# Patient Record
Sex: Male | Born: 1960 | Race: White | Hispanic: No | Marital: Single | Smoking: Never smoker
Health system: Southern US, Community
[De-identification: ages and names within clinical notes are randomized; demographics above are authoritative.]

## PROBLEM LIST (undated history)

## (undated) DIAGNOSIS — E119 Type 2 diabetes mellitus without complications: Secondary | ICD-10-CM

## (undated) DIAGNOSIS — E118 Type 2 diabetes mellitus with unspecified complications: Secondary | ICD-10-CM

## (undated) DIAGNOSIS — Z8619 Personal history of other infectious and parasitic diseases: Secondary | ICD-10-CM

## (undated) DIAGNOSIS — I10 Essential (primary) hypertension: Secondary | ICD-10-CM

## (undated) DIAGNOSIS — Z87442 Personal history of urinary calculi: Secondary | ICD-10-CM

## (undated) DIAGNOSIS — E669 Obesity, unspecified: Secondary | ICD-10-CM

## (undated) DIAGNOSIS — R112 Nausea with vomiting, unspecified: Secondary | ICD-10-CM

## (undated) DIAGNOSIS — Z794 Long term (current) use of insulin: Secondary | ICD-10-CM

## (undated) DIAGNOSIS — R000 Tachycardia, unspecified: Secondary | ICD-10-CM

## (undated) DIAGNOSIS — G4700 Insomnia, unspecified: Secondary | ICD-10-CM

## (undated) DIAGNOSIS — M542 Cervicalgia: Secondary | ICD-10-CM

## (undated) DIAGNOSIS — R519 Headache, unspecified: Secondary | ICD-10-CM

## (undated) DIAGNOSIS — M1990 Unspecified osteoarthritis, unspecified site: Secondary | ICD-10-CM

## (undated) DIAGNOSIS — G8929 Other chronic pain: Secondary | ICD-10-CM

## (undated) DIAGNOSIS — IMO0001 Reserved for inherently not codable concepts without codable children: Secondary | ICD-10-CM

## (undated) DIAGNOSIS — R51 Headache: Secondary | ICD-10-CM

## (undated) DIAGNOSIS — Z9889 Other specified postprocedural states: Secondary | ICD-10-CM

## (undated) DIAGNOSIS — F419 Anxiety disorder, unspecified: Secondary | ICD-10-CM

## (undated) DIAGNOSIS — I2510 Atherosclerotic heart disease of native coronary artery without angina pectoris: Secondary | ICD-10-CM

## (undated) DIAGNOSIS — R350 Frequency of micturition: Secondary | ICD-10-CM

## (undated) DIAGNOSIS — Z0000 Encounter for general adult medical examination without abnormal findings: Secondary | ICD-10-CM

## (undated) DIAGNOSIS — M5481 Occipital neuralgia: Secondary | ICD-10-CM

## (undated) DIAGNOSIS — K219 Gastro-esophageal reflux disease without esophagitis: Secondary | ICD-10-CM

## (undated) HISTORY — DX: Type 2 diabetes mellitus with unspecified complications: E11.8

## (undated) HISTORY — DX: Long term (current) use of insulin: Z79.4

## (undated) HISTORY — PX: SHOULDER ADHESION RELEASE: SHX773

## (undated) HISTORY — DX: Encounter for general adult medical examination without abnormal findings: Z00.00

## (undated) HISTORY — DX: Tachycardia, unspecified: R00.0

## (undated) HISTORY — DX: Type 2 diabetes mellitus without complications: E11.9

## (undated) HISTORY — DX: Reserved for inherently not codable concepts without codable children: IMO0001

## (undated) HISTORY — DX: Personal history of other infectious and parasitic diseases: Z86.19

## (undated) HISTORY — DX: Obesity, unspecified: E66.9

## (undated) HISTORY — DX: Frequency of micturition: R35.0

## (undated) HISTORY — PX: WRIST SURGERY: SHX841

## (undated) HISTORY — PX: LITHOTRIPSY: SUR834

## (undated) HISTORY — DX: Insomnia, unspecified: G47.00

---
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
## 1989-04-06 HISTORY — PX: INGUINAL HERNIA REPAIR: SUR1180

## 2001-02-04 ENCOUNTER — Ambulatory Visit (HOSPITAL_COMMUNITY): Admission: RE | Admit: 2001-02-04 | Discharge: 2001-02-04 | Payer: Self-pay | Admitting: Pulmonary Disease

## 2001-02-13 ENCOUNTER — Ambulatory Visit (HOSPITAL_COMMUNITY): Admission: RE | Admit: 2001-02-13 | Discharge: 2001-02-13 | Payer: Self-pay | Admitting: Pulmonary Disease

## 2001-07-29 ENCOUNTER — Encounter: Payer: Self-pay | Admitting: Emergency Medicine

## 2001-07-29 ENCOUNTER — Emergency Department (HOSPITAL_COMMUNITY): Admission: EM | Admit: 2001-07-29 | Discharge: 2001-07-29 | Payer: Self-pay | Admitting: Emergency Medicine

## 2001-08-03 ENCOUNTER — Emergency Department (HOSPITAL_COMMUNITY): Admission: EM | Admit: 2001-08-03 | Discharge: 2001-08-03 | Payer: Self-pay | Admitting: *Deleted

## 2001-08-03 ENCOUNTER — Encounter: Payer: Self-pay | Admitting: *Deleted

## 2002-09-24 ENCOUNTER — Encounter (INDEPENDENT_AMBULATORY_CARE_PROVIDER_SITE_OTHER): Payer: Self-pay | Admitting: Internal Medicine

## 2002-09-24 ENCOUNTER — Ambulatory Visit (HOSPITAL_COMMUNITY): Admission: RE | Admit: 2002-09-24 | Discharge: 2002-09-24 | Payer: Self-pay | Admitting: Internal Medicine

## 2003-08-27 ENCOUNTER — Ambulatory Visit (HOSPITAL_COMMUNITY): Admission: RE | Admit: 2003-08-27 | Discharge: 2003-08-27 | Payer: Self-pay | Admitting: Urology

## 2003-08-27 ENCOUNTER — Ambulatory Visit (HOSPITAL_BASED_OUTPATIENT_CLINIC_OR_DEPARTMENT_OTHER): Admission: RE | Admit: 2003-08-27 | Discharge: 2003-08-27 | Payer: Self-pay | Admitting: Urology

## 2003-08-27 HISTORY — PX: CYSTOSCOPY/RETROGRADE/URETEROSCOPY/STONE EXTRACTION WITH BASKET: SHX5317

## 2004-06-30 ENCOUNTER — Ambulatory Visit (HOSPITAL_COMMUNITY): Admission: RE | Admit: 2004-06-30 | Discharge: 2004-06-30 | Payer: Self-pay | Admitting: Pulmonary Disease

## 2004-07-24 ENCOUNTER — Ambulatory Visit: Payer: Self-pay | Admitting: Orthopedic Surgery

## 2004-08-01 ENCOUNTER — Ambulatory Visit (HOSPITAL_COMMUNITY): Admission: RE | Admit: 2004-08-01 | Discharge: 2004-08-01 | Payer: Self-pay | Admitting: Orthopedic Surgery

## 2004-08-02 ENCOUNTER — Ambulatory Visit: Payer: Self-pay | Admitting: Orthopedic Surgery

## 2004-08-10 ENCOUNTER — Encounter (HOSPITAL_COMMUNITY): Admission: RE | Admit: 2004-08-10 | Discharge: 2004-09-09 | Payer: Self-pay | Admitting: Orthopedic Surgery

## 2004-09-11 ENCOUNTER — Encounter (HOSPITAL_COMMUNITY): Admission: RE | Admit: 2004-09-11 | Discharge: 2004-10-11 | Payer: Self-pay | Admitting: Orthopedic Surgery

## 2004-09-13 ENCOUNTER — Ambulatory Visit: Payer: Self-pay | Admitting: Orthopedic Surgery

## 2004-09-26 ENCOUNTER — Encounter: Admission: RE | Admit: 2004-09-26 | Discharge: 2004-09-26 | Payer: Self-pay | Admitting: Orthopedic Surgery

## 2004-10-04 ENCOUNTER — Ambulatory Visit: Payer: Self-pay | Admitting: Orthopedic Surgery

## 2004-10-11 ENCOUNTER — Ambulatory Visit: Payer: Self-pay | Admitting: Orthopedic Surgery

## 2004-10-20 ENCOUNTER — Ambulatory Visit (HOSPITAL_COMMUNITY): Admission: RE | Admit: 2004-10-20 | Discharge: 2004-10-20 | Payer: Self-pay | Admitting: Orthopedic Surgery

## 2004-10-20 ENCOUNTER — Ambulatory Visit: Payer: Self-pay | Admitting: Orthopedic Surgery

## 2004-10-20 HISTORY — PX: SHOULDER ARTHROSCOPY W/ ROTATOR CUFF REPAIR: SHX2400

## 2004-10-23 ENCOUNTER — Ambulatory Visit: Payer: Self-pay | Admitting: Orthopedic Surgery

## 2004-10-31 ENCOUNTER — Encounter (HOSPITAL_COMMUNITY): Admission: RE | Admit: 2004-10-31 | Discharge: 2004-11-30 | Payer: Self-pay | Admitting: Orthopedic Surgery

## 2004-11-09 ENCOUNTER — Ambulatory Visit: Payer: Self-pay | Admitting: Orthopedic Surgery

## 2004-11-22 ENCOUNTER — Ambulatory Visit: Payer: Self-pay | Admitting: Orthopedic Surgery

## 2004-12-06 ENCOUNTER — Ambulatory Visit: Payer: Self-pay | Admitting: Orthopedic Surgery

## 2004-12-13 ENCOUNTER — Ambulatory Visit (HOSPITAL_COMMUNITY): Admission: RE | Admit: 2004-12-13 | Discharge: 2004-12-13 | Payer: Self-pay | Admitting: Family Medicine

## 2004-12-18 ENCOUNTER — Ambulatory Visit: Payer: Self-pay | Admitting: Orthopedic Surgery

## 2004-12-22 ENCOUNTER — Ambulatory Visit: Payer: Self-pay | Admitting: Orthopedic Surgery

## 2004-12-22 ENCOUNTER — Ambulatory Visit (HOSPITAL_COMMUNITY): Admission: RE | Admit: 2004-12-22 | Discharge: 2004-12-22 | Payer: Self-pay | Admitting: Orthopedic Surgery

## 2004-12-22 HISTORY — PX: SHOULDER ARTHROSCOPY W/ SUPERIOR LABRAL ANTERIOR POSTERIOR LESION REPAIR: SHX2402

## 2004-12-25 ENCOUNTER — Ambulatory Visit: Payer: Self-pay | Admitting: Orthopedic Surgery

## 2005-01-02 ENCOUNTER — Ambulatory Visit: Payer: Self-pay | Admitting: Orthopedic Surgery

## 2005-02-07 ENCOUNTER — Ambulatory Visit: Payer: Self-pay | Admitting: Orthopedic Surgery

## 2005-03-21 ENCOUNTER — Ambulatory Visit: Payer: Self-pay | Admitting: Orthopedic Surgery

## 2005-05-10 ENCOUNTER — Ambulatory Visit: Payer: Self-pay | Admitting: Orthopedic Surgery

## 2005-06-18 ENCOUNTER — Ambulatory Visit: Payer: Self-pay | Admitting: Orthopedic Surgery

## 2005-07-12 ENCOUNTER — Ambulatory Visit: Payer: Self-pay | Admitting: Orthopedic Surgery

## 2005-07-19 ENCOUNTER — Ambulatory Visit (HOSPITAL_COMMUNITY): Admission: RE | Admit: 2005-07-19 | Discharge: 2005-07-19 | Payer: Self-pay | Admitting: Orthopedic Surgery

## 2005-08-22 ENCOUNTER — Ambulatory Visit: Payer: Self-pay | Admitting: Orthopedic Surgery

## 2005-10-03 ENCOUNTER — Ambulatory Visit: Payer: Self-pay | Admitting: Orthopedic Surgery

## 2005-12-03 ENCOUNTER — Ambulatory Visit: Payer: Self-pay | Admitting: Orthopedic Surgery

## 2007-08-15 ENCOUNTER — Ambulatory Visit: Admission: RE | Admit: 2007-08-15 | Discharge: 2007-08-15 | Payer: Self-pay | Admitting: Rheumatology

## 2007-08-24 ENCOUNTER — Emergency Department (HOSPITAL_COMMUNITY): Admission: EM | Admit: 2007-08-24 | Discharge: 2007-08-24 | Payer: Self-pay | Admitting: Emergency Medicine

## 2007-09-23 ENCOUNTER — Ambulatory Visit: Payer: Self-pay | Admitting: Pulmonary Disease

## 2007-12-04 ENCOUNTER — Ambulatory Visit (HOSPITAL_COMMUNITY): Admission: RE | Admit: 2007-12-04 | Discharge: 2007-12-04 | Payer: Self-pay | Admitting: Rheumatology

## 2007-12-04 ENCOUNTER — Encounter (INDEPENDENT_AMBULATORY_CARE_PROVIDER_SITE_OTHER): Payer: Self-pay | Admitting: Rheumatology

## 2007-12-04 ENCOUNTER — Ambulatory Visit: Payer: Self-pay | Admitting: Surgery

## 2007-12-30 ENCOUNTER — Ambulatory Visit (HOSPITAL_COMMUNITY): Admission: RE | Admit: 2007-12-30 | Discharge: 2007-12-30 | Payer: Self-pay | Admitting: Rheumatology

## 2010-08-26 ENCOUNTER — Encounter: Payer: Self-pay | Admitting: Pulmonary Disease

## 2010-08-27 ENCOUNTER — Encounter: Payer: Self-pay | Admitting: Orthopedic Surgery

## 2010-12-19 NOTE — Procedures (Signed)
NAME:  Horn, Sean NO.:  [id number]   MEDICAL RECORD NO.:  [id number]          PATIENT TYPE:  OUT   LOCATION:  SLEEP LAB                     FACILITY:  [facility]   PHYSICIAN:  Barbaraann Share, MD,FCCPDATE OF BIRTH:  September 24[birth year] 1960   DATE OF STUDY:  08/15/2007                            NOCTURNAL POLYSOMNOGRAM   REFERRING PHYSICIAN:   INDICATION FOR STUDY:  Hypersomnia with sleep apnea.   EPWORTH SLEEPINESS SCORE:  13.   SLEEP ARCHITECTURE:  The patient had total sleep time of 370 minutes  with deceased slow wave sleep as well as REM.  Sleep onset latency was  normal at 11 minutes, and REM onset was normal at 67 minutes. Sleep  efficiency was mildly decreased at 90%.   RESPIRATORY DATA:  The patient was found to have 5 obstructive apneas  and 3 obstructive hypopneas for an apnea/hypopnea index of 2.6 events  per hour overall.  The events were not positional, but they were most  common during REM.  Loud snoring was noted throughout.  The patient did  not need split night criteria secondary to the small numbers of events.   OXYGEN DATA:  There was O2 desaturation as low as 83%.   CARDIAC DATA:  No clinically significant arrhythmias were noted.   MOVEMENT-PARASOMNIA:  The patient was found to have 18 leg jerks with 4  resulting in arousal from sleep.  These are clinically insignificant.   IMPRESSIONS-RECOMMENDATIONS:  Small numbers of obstructive events which  do not meet the apnea/hypopnea index criteria for the obstructive sleep  apnea syndrome.  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  Treatment for this can include weight loss, body  position changes, and upper airway surgery or oral appliance.     Barbaraann Share, MD,FCCP  Diplomate, American Board of Sleep  Medicine  Electronically Signed    [facility]/MEDQ  D:  09/23/2007 06:27:57  T:  09/23/2007  16:41:06  Job:  [id number]

## 2010-12-22 NOTE — Op Note (Signed)
NAME:  Sean Horn, CAJUSTE                        ACCOUNT NO.:  [id number]   MEDICAL RECORD NO.:  [id number]                   PATIENT TYPE:  AMB   LOCATION:  [facility]                                 FACILITY:  [facility]   PHYSICIAN:  Mark C. Vernie Ammons, M.D.               DATE OF BIRTH:  [birth year]   DATE OF PROCEDURE:  08/27/2003  DATE OF DISCHARGE:                                 OPERATIVE REPORT   PREOPERATIVE DIAGNOSIS:  Right ureteral calculus.   POSTOPERATIVE DIAGNOSIS:  Right ureteral calculus.   PROCEDURES:  1. Cystoscopy.  2. Right retrograde pyelogram with interpretation.  3. Laser lithotripsy with stone extraction.  4. Double J stent placement.   SURGEON:  Mark C. Vernie Ammons, M.D.   ANESTHESIA:  General.   ESTIMATED BLOOD LOSS:  Less than 1 mL.   DRAINS:  6 French, 26 cm double J stent in the right ureter.   SPECIMENS:  Stone to the patient.   COMPLICATIONS:  None.   INDICATIONS:  The patient is a 50 year old white male who has had  intermittent right flank pain and failure to progress of a 1 cm stone in his  right ureter.  It appeared on CT to be impacted with significant  periureteral inflammation.  He therefore is brought to the OR today for  ureteroscopic extraction of his stone.  The risks, complications, and  alternatives have been discussed.   DESCRIPTION OF OPERATION:  After informed consent, the patient brought to  the major OR, placed on the table, moved general anesthesia, and then moved  to the dorsal lithotomy position.  His genitalia was sterilely prepped and  draped and initially a 6 French rigid ureteroscope was introduced per  urethra.  The urethra was noted to be normal down to the sphincter, which  was intact.  The prostatic urethra was without lesion.  The bladder had no  tumors, stones, or inflammatory lesions seen.  The right ureteral orifice  was identified and a 0.038 inch floppy-tip guidewire was advanced up the  right ureter under direct  fluoroscopic control.  The evaluation of the  ureteral orifice size indicated the scope could not be passed next to the  guidewire; therefore, the ureteroscope was removed and a ureteral dilating  balloon was passed over the guidewire.  The ureter was dilated and the  guidewire was left in place.  I then was able to pass the ureteroscope next  to the guidewire up to the stone.  It was visualized also by retrograde  pyelogram through the dilating balloon as a filling defect.  The stone was  felt too large to extract, and therefore a holmium laser fiber was then  passed through the ureteroscope up to the stone.  The stone was fragmented.  I then used a combination of three-pronged grasper, a nitinol basket, and a  Segura basket to extract all of the stone fragments.  Reinspection of the  ureter  revealed that it remained intact throughout with no perforation.  There were no significant stone fragments other than just some sand grain  size pieces left in the ureter.   With the guidewire in place the cystoscope was backloaded over the guidewire  and the double J stent was passed over the guidewire into the renal pelvis.  The guidewire was removed, resulting in a good curl of the stent in the  renal pelvis and bladder.  There were some small bits of stone seen on the  floor of the bladder from where they had passed down the ureter.  These will  pass spontaneously with voiding.   I have given the patient a prescription for Pyridium Plus, #24, and he has  Tylox.  He will return to my office for stent removal in approximately one  week.                                               Mark C. Vernie Ammons, M.D.    MCO/MEDQ  D:  08/27/2003  T:  08/27/2003  Job:  [id number]

## 2010-12-22 NOTE — H&P (Signed)
NAME:  Sean Horn, Sean Horn NO.:  [id number]   MEDICAL RECORD NO.:  [id number]          PATIENT TYPE:  AMB   LOCATION:  DAY                           FACILITY:  [facility]   PHYSICIAN:  Vickki Hearing, M.D.DATE OF BIRTH:  May 15, 1961   DATE OF ADMISSION:  DATE OF DISCHARGE:  LH                                HISTORY & PHYSICAL   CHIEF COMPLAINT:  Left shoulder pain.   PAST MEDICAL HISTORY:  1.  Hypertension.  2.  Diabetes.  3.  Status post left shoulder distal clavicle excision, right wrist fusion.  4.  Left knee arthropathy and cyst removal.  5.  Cholecystectomy.  6.  Left inguinal herniorrhaphy.   PAST SURGICAL HISTORY:  As stated.   FAMILY HISTORY:  Heart disease, lung disease, arthritis, cancer.   DRUG ALLERGIES:  TORADOL.   MEDICATIONS:  Avandamet and Diovan.   This 50 year old male who had an open Mumford procedure of his left shoulder  after trauma did well for seven years. Over the past year, he has had  increasing left shoulder pain and has failed conservative therapy. He had a  positive lidocaine injection test for impingement of the left shoulder. He  had a MRI of the left shoulder without contrast which showed partial  thickness tears of the distal supraspinatus tendon and tendinopathy type  changes of the tendon.   SOCIAL HISTORY:  His family physician is Dr. Juanetta Gosling. 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   PHYSICAL EXAMINATION:  VITAL SIGNS:  Weight 240, pulse 78, respiratory rate  20.  GENERAL:  Development, grooming, and hygiene normal. Body habitus large.  CARDIOVASCULAR:  Observation and palpation normal.  LYMPHATIC:  Cervical lymph nodes normal.  SKIN: Incision over clavicle on the left normal.  NEUROPSYCH:  Normal reflexes, sensation, coordination. He is alert and  oriented x3. His mood is pleasant.  MUSCULOSKELETAL:  Gait and station are normal. Cervical spine is nontender.  Left shoulder range of motion 0 to 120  in flexion, 35 degrees external  rotation, internal rotation to L2. He has normal shoulder stability. Muscle  strength and tone are normal. Impingement sign is positive. O'Brien's test  is normal.   IMPRESSION:  Left shoulder rotator cuff syndrome. Recommend arthroscopic  subacromial decompression, left shoulder. Possible mini-open rotator cuff  repair.      SEH/MEDQ  D:  10/19/2004  T:  10/19/2004  Job:  [id number]

## 2010-12-22 NOTE — Op Note (Signed)
NAME:  Sean, Horn NO.:  [id number]   MEDICAL RECORD NO.:  [id number]          PATIENT TYPE:  AMB   LOCATION:  DAY                           FACILITY:  [facility]   PHYSICIAN:  Vickki Hearing, M.D.DATE OF BIRTH:  May 15, 1961   DATE OF PROCEDURE:  10/20/2004  DATE OF DISCHARGE:  10/20/2004                                 OPERATIVE REPORT   Continuation of dictation on Sean Horn:   The posterior portal was used and a diagnostic arthroscopy was performed.  An anterior portal was established and a rotator cuff tear was found and was  debrided from an anterior portal.  Remaining structures in the shoulder were  normal.  The subacromial space was entered and a lateral portal was  established.  Using an anterior and lateral portal, a bursectomy was  performed and an acromioplasty was performed.  Extensive debridement of the  rotator cuff was performed with a Topaz wand and the shoulder was irrigated  and the portal sites were closed with nylon suture.  Several accessory  portals were used to better assess the tendon with the Topaz wand.  The  portals were closed with 3-0 Prolene.  Sterile dressings were applied after  injecting the subacromial space with 30 mL of Marcaine.  Cryo/Cuff was  applied along with the sterile dressings, and the patient was extubated in  recovery room in stable condition.      SEH/MEDQ  D:  10/21/2004  T:  10/23/2004  Job:  [id number]

## 2010-12-22 NOTE — H&P (Signed)
NAME:  Sean Horn, Sean Horn NO.:  [id number]   MEDICAL RECORD NO.:  [id number]          PATIENT TYPE:  AMB   LOCATION:  DAY                           FACILITY:  [facility]   PHYSICIAN:  Vickki Hearing, M.D.DATE OF BIRTH:  [birth year]   DATE OF ADMISSION:  DATE OF DISCHARGE:  LH                                HISTORY & PHYSICAL   CHIEF COMPLAINT:  Recurrent left shoulder pain.   HISTORY OF PRESENT ILLNESS:  This is a 50 year old male who had an open  Mumford procedure on his left shoulder after trauma seven or eight years  ago.  Over the last year and a half he had increasing left shoulder pain,  failed conservative therapy including Lidocaine test and he had an MRI which  showed partial thickness tear of his supraspinatus tendon.  He underwent  arthroscopy of the shoulder, subacromial decompression, debridement of his  rotator cuff.  He was doing well in therapy until during one therapy session  he felt a pop.  It progressively worsened and eventually he had a new MRI  which showed a torn supraspinatus tendon.  Therefore we are going to do an  arthroscopy of the left shoulder and a mini open rotator cuff repair.   DIAGNOSIS:  Torn rotator cuff with a possible slat injury as well.   PAST MEDICAL HISTORY:  1.  Hypertension.  2.  Diabetes.  3.  Status post right wrist fusion.  4.  Left knee arthroscopy and cyst removal.  5.  Cholecystectomy.  6.  Inguinal hernia repair on the left.   PAST SURGICAL HISTORY:  As stated.   FAMILY HISTORY:  Heart and lung disease, arthritis, cancer.   ALLERGIES:  TORADOL.   MEDICATIONS:  Avandamet and Diovan.   SOCIAL HISTORY:  He is followed by Dr. Juanetta Gosling.  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   PHYSICAL EXAMINATION:  VITAL SIGNS:  Weight 240, pulse 76, respiratory rate  18.  GENERAL:  Shows normal development, grooming, and hygiene.  Body habitus  large.  CARDIOVASCULAR:  Observation and palpation  normal.  LYMPH NODES:  Cervical lymph nodes benign.  SKIN:  Incision over clavicle normal.  NEUROPSYCHIATRIC:  Reflexes, sensation, coordination normal.  He is alert  and oriented x3.  Mood is pleasant.  MUSCULOSKELETAL:  Shows normal gait and station.  Cervical spine is  nontender.  He has painful range of motion in his left shoulder.  There is  100 degrees of flexion, and after that severe pain.  There is weakness in  the rotator cuff but it is not significant.  The shoulder is stable.  Impingement sign remains positive.   IMPRESSION:  Left rotator cuff tear.   PLAN:  Arthroscopy and mini open rotator cuff repair.      SEH/MEDQ  D:  12/21/2004  T:  12/21/2004  Job:  [id number]

## 2010-12-22 NOTE — Op Note (Signed)
NAME:  [name], [name] NO.:  [id number]   MEDICAL RECORD NO.:  [id number]          PATIENT TYPE:  AMB   LOCATION:  DAY                           FACILITY:  [facility]   PHYSICIAN:  Vickki Hearing, M.D.DATE OF BIRTH:  03/26/1961   DATE OF PROCEDURE:  10/20/2004  DATE OF DISCHARGE:  10/20/2004                                 OPERATIVE REPORT   PREOPERATIVE DIAGNOSIS:  Rotator cuff syndrome, left shoulder.   POSTOPERATIVE DIAGNOSIS:  Rotator cuff syndrome, left shoulder.   PROCEDURE:  Arthroscopic surgery, left shoulder; arthroscopic subacromial  decompression and extensive debridement of rotator cuff.   SURGEON:  Vickki Hearing, M.D.   ANESTHETIC:  General.   OPERATIVE FINDINGS:  There was severe bursitis of the subacromial space.  There was a mild undersurface tear at the of the rotator cuff supraspinatus  tendon.   INDICATIONS FOR PROCEDURE:  Pain.   The patient identified as Sean Horn, the left shoulder as marked for  surgery, the history and physical was updated, antibiotics were given.  He  was taken to the operating room for general anesthesia.  He was placed in a  semiupright position in the beach chair modified position.  His left  shoulder was prepped and draped using sterile technique.  After satisfactory  anesthesia, time-out was taken.  The patient identity was confirmed,  procedure was confirmed, and the extremity site was confirmed.  All  equipment was in the room.   A posterior portal was used.   Dictation ended at this point.      SEH/MEDQ  D:  10/21/2004  T:  10/23/2004  Job:  [id number]

## 2010-12-22 NOTE — Op Note (Signed)
NAME:  Sean Horn, Sean Horn NO.:  [id number]   MEDICAL RECORD NO.:  [id number]          PATIENT TYPE:  AMB   LOCATION:  DAY                           FACILITY:  [facility]   PHYSICIAN:  Vickki Hearing, M.D.DATE OF BIRTH:  1960/11/28   DATE OF PROCEDURE:  12/22/2004  DATE OF DISCHARGE:  12/22/2004                                 OPERATIVE REPORT   PREOPERATIVE DIAGNOSES:  1.  Supraspinatus tear, left shoulder.  2.  SLAP lesion type 1, left shoulder.   POSTOPERATIVE DIAGNOSES:  1.  Supraspinatus tear, left shoulder.  2.  SLAP lesion type 1, left shoulder.   OPERATION/PROCEDURE:  1.  Arthroscopy, left shoulder.  2.  Debridement of degenerative SLAP tear.  3.  Inspection of rotator cuff with rotator cuff repair.   SURGEON:  Vickki Hearing, M.D.   ANESTHESIA:  General.   OPERATIVE FINDINGS:  There was a SLAP lesion, grade 1, which was debrided.  There was intrasubstance degeneration and tearing the supraspinatus tendon.  This was repaired with two #2 surgical Ethibond sutures.  The history is  that the patient had decompression of the left shoulder.  Did well in  therapy about four to six weeks.  Then felt the pop in the shoulder during  therapy and had progressive increase in pain and decrease in motion.  Repeat  MRI with contrast showed a supraspinatus tendon and a SLAP tear.  The  surgery was done as follows:   DESCRIPTION OF PROCEDURE:  Preoperatively his left shoulder was marked at  the surgical site, signed by the surgeon.  History and physical was updated  and his antibiotics were given.   He was taken to the operating room for general anesthesia, placed in the  beach chair position and positioned appropriately.  His left shoulder was  prepped and draped using sterile technique. Time out was taken and the  parameters of the time out were completed.   Through a posterior approach we did a diagnostic arthroscopy.  There was a  grade 1 SLAP lesion.  This was debrided from an anterior cannula with a  motorized shaver.  There was undersurface tearing of the rotator cuff.  The  scope was placed in the subacromial space.  There was inflammation in the  subacromial space in the tendon itself but no bursitis.  Through a mini  incision in the skin, it was divided over the anterolateral acromion.  Deltoid was split and the tendon was split in line with its fibers  (supraspinatus) and the delamination seen from inside the joint was  inspected, debrided and then two #2 Ethibond sutures were used to close the  defect.  The wound was irrigated.  Subcutaneous pain pump was placed.  The  deltoid was reapproximated with interrupted sutures and drill holes were  used to reapproximate the portion which was removed from the bone.  A 2-0  Monocryl and staples were used to reapproximate the skin.  Sterile dressing  was applied.  Cryocuff was applied.  The patient was extubated and taken to  the recovery room in stable condition.  SEH/MEDQ  D:  12/23/2004  T:  12/25/2004  Job:  [id number]

## 2011-04-26 LAB — CBC
HCT: 43.4
Hemoglobin: 14.8
MCHC: 34
MCV: 85.3
Platelets: 347
RBC: 5.09
RDW: 13.1
WBC: 16 — ABNORMAL HIGH

## 2011-04-26 LAB — DIFFERENTIAL
Band Neutrophils: 0
Basophils Absolute: 0
Basophils Relative: 0
Blasts: 0
Eosinophils Absolute: 4.8 — ABNORMAL HIGH
Eosinophils Relative: 30 — ABNORMAL HIGH
Lymphocytes Relative: 34
Lymphs Abs: 5.5 — ABNORMAL HIGH
Metamyelocytes Relative: 0
Monocytes Absolute: 1.4 — ABNORMAL HIGH
Monocytes Relative: 9
Myelocytes: 0
Neutro Abs: 4.3
Neutrophils Relative %: 27 — ABNORMAL LOW
Promyelocytes Absolute: 0
nRBC: 0

## 2011-04-26 LAB — BASIC METABOLIC PANEL
BUN: 6
CO2: 29
Calcium: 8.8
Chloride: 97
Creatinine, Ser: 0.84
GFR calc Af Amer: >60
GFR calc non Af Amer: >60
Glucose, Bld: 143 — ABNORMAL HIGH
Potassium: 3.7
Sodium: 133 — ABNORMAL LOW

## 2011-04-26 LAB — POCT CARDIAC MARKERS
CKMB, poc: <1 — ABNORMAL LOW
Myoglobin, poc: 56.8
Operator id: 189501
Troponin i, poc: <0.05

## 2011-04-26 LAB — CK: Total CK: 37

## 2011-04-26 LAB — SEDIMENTATION RATE: Sed Rate: 36 — ABNORMAL HIGH

## 2011-05-22 ENCOUNTER — Ambulatory Visit: Payer: Self-pay | Admitting: Orthopedic Surgery

## 2011-08-07 HISTORY — PX: KNEE ARTHROSCOPY: SHX127

## 2012-06-30 ENCOUNTER — Encounter (HOSPITAL_COMMUNITY): Payer: Self-pay | Admitting: *Deleted

## 2012-06-30 ENCOUNTER — Emergency Department (HOSPITAL_COMMUNITY)
Admission: EM | Admit: 2012-06-30 | Discharge: 2012-06-30 | Disposition: A | Discharge status: Home / Self Care | Payer: Managed Care, Other (non HMO) | Attending: Emergency Medicine | Admitting: Emergency Medicine

## 2012-06-30 DIAGNOSIS — R109 Unspecified abdominal pain: Secondary | ICD-10-CM

## 2012-06-30 DIAGNOSIS — R350 Frequency of micturition: Secondary | ICD-10-CM | POA: Insufficient documentation

## 2012-06-30 DIAGNOSIS — Z79899 Other long term (current) drug therapy: Secondary | ICD-10-CM | POA: Insufficient documentation

## 2012-06-30 DIAGNOSIS — R739 Hyperglycemia, unspecified: Secondary | ICD-10-CM

## 2012-06-30 DIAGNOSIS — I10 Essential (primary) hypertension: Secondary | ICD-10-CM | POA: Insufficient documentation

## 2012-06-30 DIAGNOSIS — E1169 Type 2 diabetes mellitus with other specified complication: Secondary | ICD-10-CM | POA: Insufficient documentation

## 2012-06-30 HISTORY — DX: Essential (primary) hypertension: I10

## 2012-06-30 LAB — URINALYSIS, ROUTINE W REFLEX MICROSCOPIC
Bilirubin Urine: NEGATIVE
Ketones, ur: 15 mg/dL — AB
Leukocytes, UA: NEGATIVE
Nitrite: NEGATIVE
Protein, ur: NEGATIVE mg/dL
Urobilinogen, UA: 0.2 mg/dL (ref 0.0–1.0)

## 2012-06-30 LAB — CBC WITH DIFFERENTIAL/PLATELET
Basophils Absolute: 0 10*3/uL (ref 0.0–0.1)
Basophils Relative: 1 % (ref 0–1)
Eosinophils Absolute: 0.1 10*3/uL (ref 0.0–0.7)
Eosinophils Relative: 2 % (ref 0–5)
Lymphocytes Relative: 33 % (ref 12–46)
MCHC: 38.1 g/dL — ABNORMAL HIGH (ref 30.0–36.0)
MCV: 84.7 fL (ref 78.0–100.0)
Platelets: 224 10*3/uL (ref 150–400)
RDW: 12.3 % (ref 11.5–15.5)
WBC: 5.3 10*3/uL (ref 4.0–10.5)

## 2012-06-30 LAB — BASIC METABOLIC PANEL
BUN: 14 mg/dL (ref 6–23)
Calcium: 8.6 mg/dL (ref 8.4–10.5)
Chloride: 96 mEq/L (ref 96–112)
Creatinine, Ser: 0.82 mg/dL (ref 0.50–1.35)
GFR calc Af Amer: >90 mL/min (ref >90)
GFR calc non Af Amer: >90 mL/min (ref >90)

## 2012-06-30 LAB — GLUCOSE, CAPILLARY: Glucose-Capillary: 450 mg/dL — ABNORMAL HIGH (ref 70–99)

## 2012-06-30 MED ORDER — INSULIN REGULAR HUMAN 100 UNIT/ML IJ SOLN: 5.0000 [IU] | Freq: Once | INTRAMUSCULAR | Status: DC

## 2012-06-30 MED ORDER — SODIUM CHLORIDE 0.9 % IV BOLUS (SEPSIS)
1000.0000 mL | Freq: Once | INTRAVENOUS | Status: AC
  Administered 2012-06-30: 1000 mL via INTRAVENOUS

## 2012-06-30 MED ORDER — INSULIN ASPART 100 UNIT/ML ~~LOC~~ SOLN
5.0000 [IU] | Freq: Once | SUBCUTANEOUS | Status: AC
  Administered 2012-06-30: 5 [IU] via INTRAVENOUS
  Filled 2012-06-30: qty 1

## 2012-06-30 NOTE — ED Provider Notes (Signed)
History     CSN: [id number]  Arrival date & time 06/30/12  7829   First MD Initiated Contact with Patient 06/30/12 1032      Chief Complaint  Patient presents with  . Hyperglycemia  . Abdominal Pain    (Consider location/radiation/quality/duration/timing/severity/associated sxs/prior treatment) Patient is a 51 y.o. male presenting with abdominal pain. The history is provided by the patient.  Abdominal Pain The primary symptoms of the illness include abdominal pain. The primary symptoms of the illness do not include fever, shortness of breath, nausea, vomiting, diarrhea or dysuria.  Additional symptoms associated with the illness include frequency. Associated symptoms comments: Abdominal discomfort without nausea, vomiting or change in bowel habit. Last bowel movement was this morning around 1:00. He reports increasing urinary frequency. No testicular discomfort or flank pain. He is a diabetic on Janumet for 3 years with consistent blood sugar control and no recent changes in dosing or diet. .    Past Medical History  Diagnosis Date  . Diabetes mellitus without complication   . Hypertension     Past Surgical History  Procedure Date  . Wrist surgery     Right  . Shoulder surgery     left  . Knee surgery     bilateral  . Hernia repair     left  . Cholecystectomy     History reviewed. No pertinent family history.  History  Substance Use Topics  . Smoking status: Never Smoker   . Smokeless tobacco: Current User  . Alcohol Use: No      Review of Systems  Constitutional: Negative for fever.  Respiratory: Negative.  Negative for shortness of breath.   Cardiovascular: Negative.  Negative for chest pain.  Gastrointestinal: Positive for abdominal pain. Negative for nausea, vomiting and diarrhea.  Genitourinary: Positive for frequency. Negative for dysuria, flank pain, scrotal swelling, penile pain and testicular pain.  Musculoskeletal: Negative.   Neurological:  Negative.  Negative for dizziness and weakness.    Allergies  Toradol  Home Medications   Current Outpatient Rx  Name  Route  Sig  Dispense  Refill  . ALPRAZOLAM 0.5 MG PO TABS   Oral   Take 0.25 mg by mouth 2 (two) times daily.         Marland Kitchen LISINOPRIL-HYDROCHLOROTHIAZIDE 20-12.5 MG PO TABS   Oral   Take 1 tablet by mouth daily.         Marland Kitchen NAPROXEN-ESOMEPRAZOLE 500-20 MG PO TBEC   Oral   Take 1 tablet by mouth 2 (two) times daily.         Marland Kitchen SITAGLIPTIN-METFORMIN HCL 50-1000 MG PO TABS   Oral   Take 1 tablet by mouth 2 (two) times daily with a meal.           BP 128/89  Pulse 110  Temp 98.2 F (36.8 C) (Oral)  Resp 18  SpO2 98%  Physical Exam  Constitutional: He appears well-developed and well-nourished.  HENT:  Head: Normocephalic.  Neck: Normal range of motion. Neck supple.  Cardiovascular: Normal rate and regular rhythm.   Pulmonary/Chest: Effort normal and breath sounds normal.  Abdominal: Soft. Bowel sounds are normal. He exhibits no distension and no mass. There is tenderness. There is no rebound and no guarding.       Mild lower abdominal tenderness to palpation, greater on left. No mass. BS normoactive.  Musculoskeletal: Normal range of motion.  Neurological: He is alert. No cranial nerve deficit.  Skin: Skin is warm and dry. No  rash noted.  Psychiatric: He has a normal mood and affect.    ED Course  Procedures (including critical care time)  Labs Reviewed  GLUCOSE, CAPILLARY - Abnormal; Notable for the following:    Glucose-Capillary 450 (*)     All other components within normal limits   No results found. Results for orders placed during the hospital encounter of 06/30/12  GLUCOSE, CAPILLARY      Component Value Range   Glucose-Capillary 450 (*) 70 - 99 mg/dL  CBC WITH DIFFERENTIAL      Component Value Range   WBC 5.3  4.0 - 10.5 K/uL   RBC 4.43  4.22 - 5.81 MIL/uL   Hemoglobin 14.3  13.0 - 17.0 g/dL   HCT 81.1 (*) 91.4 - 78.2 %   MCV  84.7  78.0 - 100.0 fL   MCH 32.3  26.0 - 34.0 pg   MCHC 38.1 (*) 30.0 - 36.0 g/dL   RDW 95.6  21.3 - 08.6 %   Platelets 224  150 - 400 K/uL   Neutrophils Relative 55  43 - 77 %   Neutro Abs 3.0  1.7 - 7.7 K/uL   Lymphocytes Relative 33  12 - 46 %   Lymphs Abs 1.8  0.7 - 4.0 K/uL   Monocytes Relative 9  3 - 12 %   Monocytes Absolute 0.5  0.1 - 1.0 K/uL   Eosinophils Relative 2  0 - 5 %   Eosinophils Absolute 0.1  0.0 - 0.7 K/uL   Basophils Relative 1  0 - 1 %   Basophils Absolute 0.0  0.0 - 0.1 K/uL   Smear Review MORPHOLOGY UNREMARKABLE    BASIC METABOLIC PANEL      Component Value Range   Sodium 130 (*) 135 - 145 mEq/L   Potassium 3.8  3.5 - 5.1 mEq/L   Chloride 96  96 - 112 mEq/L   CO2 24  19 - 32 mEq/L   Glucose, Bld 405 (*) 70 - 99 mg/dL   BUN 14  6 - 23 mg/dL   Creatinine, Ser 5.78  0.50 - 1.35 mg/dL   Calcium 8.6  8.4 - 46.9 mg/dL   GFR calc non Af Amer >90  >90 mL/min   GFR calc Af Amer >90  >90 mL/min  URINALYSIS, ROUTINE W REFLEX MICROSCOPIC      Component Value Range   Color, Urine YELLOW  YELLOW   APPearance CLEAR  CLEAR   Specific Gravity, Urine >1.046 (*) 1.005 - 1.030   pH 5.5  5.0 - 8.0   Glucose, UA >1000 (*) NEGATIVE mg/dL   Hgb urine dipstick NEGATIVE  NEGATIVE   Bilirubin Urine NEGATIVE  NEGATIVE   Ketones, ur 15 (*) NEGATIVE mg/dL   Protein, ur NEGATIVE  NEGATIVE mg/dL   Urobilinogen, UA 0.2  0.0 - 1.0 mg/dL   Nitrite NEGATIVE  NEGATIVE   Leukocytes, UA NEGATIVE  NEGATIVE  URINE MICROSCOPIC-ADD ON      Component Value Range   Bacteria, UA RARE  RARE  GLUCOSE, CAPILLARY      Component Value Range   Glucose-Capillary 382 (*) 70 - 99 mg/dL   Comment 1 Documented in Chart     Comment 2 Notify RN    GLUCOSE, CAPILLARY      Component Value Range   Glucose-Capillary 275 (*) 70 - 99 mg/dL   Comment 1 Notify RN       No diagnosis found.  1. Hyperglycemia 2. Abdominal pain  MDM  His abdominal discomfort is persistent and exam is unchanged.  Labs are essentially normal and blood sugar has responded to small dose of insulin and fluid boluses. Dr. Jeraldine Loots has co-evaluated the patient. Feel he is stable for discharge. Patient and family comfortable with care plan.        Rodena Medin, PA-C 06/30/12 1606

## 2012-06-30 NOTE — ED Notes (Signed)
Pt states last week started noticing his blood sugars were high when checking them and was having polyuria, then lower abdominal pain, called Dr. Burna Forts office and made an appt, today went to see Dr. Chestine Spore and CBG was 463 w/ large ketones in urine, was sent here to be evaluated further.

## 2012-07-01 NOTE — ED Provider Notes (Signed)
Medical screening examination/treatment/procedure(s) were conducted as a shared visit with non-physician practitioner(s) and myself.  I personally evaluated the patient during the encounter On my exam the patient was in no distress, with no abdominal pain.  With his clinical improvement, the reduction in his blood glucose, and the absence of new symptoms, the patient was discharged in stable condition  Gerhard Munch, MD 07/01/12 [phone]

## 2012-09-24 ENCOUNTER — Other Ambulatory Visit (HOSPITAL_COMMUNITY): Payer: Self-pay | Admitting: Internal Medicine

## 2012-09-24 DIAGNOSIS — I739 Peripheral vascular disease, unspecified: Secondary | ICD-10-CM

## 2012-09-24 DIAGNOSIS — M25569 Pain in unspecified knee: Secondary | ICD-10-CM

## 2012-09-25 ENCOUNTER — Ambulatory Visit (HOSPITAL_COMMUNITY)
Admission: RE | Admit: 2012-09-25 | Discharge: 2012-09-25 | Disposition: A | Discharge status: Home / Self Care | Payer: Managed Care, Other (non HMO) | Source: Ambulatory Visit | Attending: Internal Medicine | Admitting: Internal Medicine

## 2012-09-25 DIAGNOSIS — I739 Peripheral vascular disease, unspecified: Secondary | ICD-10-CM

## 2012-09-25 DIAGNOSIS — M25569 Pain in unspecified knee: Secondary | ICD-10-CM

## 2012-09-25 DIAGNOSIS — M79609 Pain in unspecified limb: Secondary | ICD-10-CM

## 2012-09-25 DIAGNOSIS — I10 Essential (primary) hypertension: Secondary | ICD-10-CM | POA: Insufficient documentation

## 2012-09-25 DIAGNOSIS — E119 Type 2 diabetes mellitus without complications: Secondary | ICD-10-CM | POA: Insufficient documentation

## 2012-09-25 NOTE — Progress Notes (Signed)
VASCULAR LAB PRELIMINARY  PRELIMINARY  PRELIMINARY  PRELIMINARY  ABIs completed.     VASCULAR LAB PRELIMINARY  ARTERIAL  ABI completed: WNL    RIGHT    LEFT    PRESSURE WAVEFORM  PRESSURE WAVEFORM  BRACHIAL 123  T BRACHIAL 123  T  DP   DP    AT 129 B AT 133 B  PT 137 B PT 140 B  PER   PER    GREAT TOE  NA GREAT TOE  NA    RIGHT LEFT  ABI >1.0 >1.0     Sean Horn, RVT 09/25/2012, 11:10 AM   Sean Horn, RVT 09/25/2012, 11:09 AM

## 2012-12-15 ENCOUNTER — Other Ambulatory Visit: Payer: Self-pay | Admitting: Orthopaedic Surgery

## 2012-12-15 ENCOUNTER — Encounter (HOSPITAL_COMMUNITY): Payer: Self-pay | Admitting: Pharmacy Technician

## 2012-12-15 NOTE — Pre-Procedure Instructions (Signed)
Sean Horn  12/15/2012   Your procedure is scheduled on:  Tuesday, May 20th   Report to [facility] at 5:30 AM.             Bonita Quin will come through Entrance "A"..Follow hallway to the [facility] those to the 3rd floor)   Call this number if you have problems the morning of surgery: [phone]   Remember:   Do not eat food or drink liquids after midnight Monday.   Take these medicines the morning of surgery with A SIP OF WATER: Xanax   Do not wear jewelry.  Do not wear lotions, powders, or colognes. You may NOT wear deodorant.   Men may shave face and neck.   Do not bring valuables to the hospital.  Contacts, dentures or bridgework may not be worn into surgery.   Leave suitcase in the car. After surgery it may be brought to your room.  For patients admitted to the hospital, checkout time is 11:00 AM the day of discharge.   Name and phone number of your driver:    Special Instructions: Shower using CHG 2 nights before surgery and the night before surgery.  If you shower the day of surgery use CHG.  Use special wash - you have one bottle of CHG for all showers.  You should use approximately 1/3 of the bottle for each shower.   Please read over the following fact sheets that you were given: Pain Booklet, Coughing and Deep Breathing, Blood Transfusion Information, MRSA Information and Surgical Site Infection Prevention

## 2012-12-16 ENCOUNTER — Encounter (HOSPITAL_COMMUNITY)
Admission: RE | Admit: 2012-12-16 | Discharge: 2012-12-16 | Disposition: A | Discharge status: Home / Self Care | Payer: Managed Care, Other (non HMO) | Source: Ambulatory Visit | Attending: Orthopaedic Surgery | Admitting: Orthopaedic Surgery

## 2012-12-16 ENCOUNTER — Encounter (HOSPITAL_COMMUNITY): Payer: Self-pay

## 2012-12-16 HISTORY — DX: Other specified postprocedural states: Z98.890

## 2012-12-16 HISTORY — DX: Anxiety disorder, unspecified: F41.9

## 2012-12-16 HISTORY — DX: Other specified postprocedural states: R11.2

## 2012-12-16 HISTORY — DX: Gastro-esophageal reflux disease without esophagitis: K21.9

## 2012-12-16 LAB — CBC WITH DIFFERENTIAL/PLATELET
Eosinophils Relative: 1 % (ref 0–5)
HCT: 39.8 % (ref 39.0–52.0)
Lymphocytes Relative: 30 % (ref 12–46)
Lymphs Abs: 2.1 10*3/uL (ref 0.7–4.0)
MCV: 85.6 fL (ref 78.0–100.0)
Monocytes Absolute: 0.9 10*3/uL (ref 0.1–1.0)
RBC: 4.65 MIL/uL (ref 4.22–5.81)
WBC: 7.2 10*3/uL (ref 4.0–10.5)

## 2012-12-16 LAB — URINALYSIS, ROUTINE W REFLEX MICROSCOPIC
Nitrite: NEGATIVE
Specific Gravity, Urine: 1.03 (ref 1.005–1.030)
Urobilinogen, UA: 0.2 mg/dL (ref 0.0–1.0)

## 2012-12-16 LAB — SURGICAL PCR SCREEN
MRSA, PCR: NEGATIVE
Staphylococcus aureus: NEGATIVE

## 2012-12-16 LAB — TYPE AND SCREEN: Antibody Screen: NEGATIVE

## 2012-12-16 LAB — APTT: aPTT: 29 seconds (ref 24–37)

## 2012-12-16 LAB — BASIC METABOLIC PANEL
CO2: 23 mEq/L (ref 19–32)
Chloride: 92 mEq/L — ABNORMAL LOW (ref 96–112)
Creatinine, Ser: 0.91 mg/dL (ref 0.50–1.35)
Glucose, Bld: 378 mg/dL — ABNORMAL HIGH (ref 70–99)
Sodium: 131 mEq/L — ABNORMAL LOW (ref 135–145)

## 2012-12-16 NOTE — Progress Notes (Signed)
12/16/12 1043  OBSTRUCTIVE SLEEP APNEA  Have you ever been diagnosed with sleep apnea through a sleep study? No  Do you snore loudly (loud enough to be heard through closed doors)?  1  Do you often feel tired, fatigued, or sleepy during the daytime? 0  Has anyone observed you stop breathing during your sleep? 0  Do you have, or are you being treated for high blood pressure? 1  BMI more than 35 kg/m2? 0  Age over 52 years old? 1  Neck circumference greater than 40 cm/18 inches? 0  Gender: 1  Obstructive Sleep Apnea Score 4  Score 4 or greater  Results sent to PCP

## 2012-12-16 NOTE — Progress Notes (Signed)
Primary physician - dr. Juanetta Gosling Cardiologist - does not have one. No recent cardiac testing. Had ekg little over year ago.

## 2012-12-17 NOTE — Progress Notes (Signed)
This is a 52 year old male who is scheduled for right knee arthroplasty to be performed by Dr. Marcene Corning on Tuesday 23 Dec 2012. An EKG dated 16 Dec 2012 showed NSR (ventricular rate of 99 bpm), normal EKG without any significant change since last tracing. A CXR was performed on 16 Dec 2012 and compared to previous exam dated 30 Dec 2007 showed likely right basilar scarring with tiny nodules which appear stable from previous exams. They are likely benign in etiology. Lab results dated 16 Dec 2012 showed an elevated serum glucose level of 378. I personally spoke with this patient today. He works third shift and had eaten at 0230 so his labs were non-fasting. He is on a four medication therapy for diabetes to include Glucotrol, Tradjenta and Pioglitazone-Metformin which he had not taken on the AM of his labs. Per protocol, his FSBS will be checked upon arrival on DOS. May proceed with surgery as scheduled.

## 2012-12-18 NOTE — H&P (Signed)
TOTAL KNEE ADMISSION H&P  Patient is being admitted for right total knee arthroplasty.  Subjective:  Chief Complaint:right knee pain.  HPI: Sean Horn, 52 y.o. male, has a history of pain and functional disability in the right knee due to arthritis and has failed non-surgical conservative treatments for greater than 12 weeks to includeNSAID's and/or analgesics, corticosteriod injections, use of assistive devices and activity modification.  Onset of symptoms was gradual, starting 7 years ago with gradually worsening course since that time. The patient noted prior procedures on the knee to include  arthroscopy on the right knee(s).  Patient currently rates pain in the right knee(s) at 8 out of 10 with activity. Patient has night pain, worsening of pain with activity and weight bearing, pain that interferes with activities of daily living, pain with passive range of motion and crepitus.  Patient has evidence of subchondral sclerosis, periarticular osteophytes and joint space narrowing by imaging studies. This patient has had scope. There is no active infection.  There are no active problems to display for this patient.  Past Medical History  Diagnosis Date  . Hypertension   . PONV (postoperative nausea and vomiting)   . Anxiety   . Diabetes mellitus without complication     fasting 120s  . Arthritis   . GERD (gastroesophageal reflux disease)     Past Surgical History  Procedure Laterality Date  . Wrist surgery      Right  . Shoulder surgery      left  . Knee surgery      bilateral  . Hernia repair      left  . Cholecystectomy      No prescriptions prior to admission   Allergies  Allergen Reactions  . Toradol (Ketorolac Tromethamine) Hives    History  Substance Use Topics  . Smoking status: Never Smoker   . Smokeless tobacco: Current User    Types: Chew  . Alcohol Use: No    No family history on file.   Review of Systems  Constitutional: Negative.   HENT:  Negative.   Eyes: Negative.   Respiratory: Negative.   Cardiovascular: Negative.   Gastrointestinal: Negative.   Genitourinary: Negative.   Musculoskeletal: Positive for joint pain.  Skin: Negative.   Neurological: Negative.   Endo/Heme/Allergies: Negative.   Psychiatric/Behavioral: Negative.     Objective:  Physical Exam  Constitutional: He appears well-nourished.  HENT:  Head: Normocephalic.  Eyes: Pupils are equal, round, and reactive to light.  Neck: Normal range of motion.  Cardiovascular: Normal rate and regular rhythm.   Respiratory: Breath sounds normal.  GI: Soft.  Musculoskeletal:  r knee: 0-120 , 1+ crepitation, pain at pf joint and medial joint line to palpation. Gait- antalgic  Neurological: He is alert.  Skin: Skin is warm.  Psychiatric: He has a normal mood and affect.    Vital signs in last 24 hours:    Labs:   There is no height or weight on file to calculate BMI.   Imaging Review Plain radiographs demonstrate severe degenerative joint disease of the right knee(s). The overall alignment isneutral. The bone quality appears to be good for age and reported activity level.  Assessment/Plan:  End stage arthritis, right knee   The patient history, physical examination, clinical judgment of the provider and imaging studies are consistent with end stage degenerative joint disease of the right knee(s) and total knee arthroplasty is deemed medically necessary. The treatment options including medical management, injection therapy arthroscopy and arthroplasty were  discussed at length. The risks and benefits of total knee arthroplasty were presented and reviewed. The risks due to aseptic loosening, infection, stiffness, patella tracking problems, thromboembolic complications and other imponderables were discussed. The patient acknowledged the explanation, agreed to proceed with the plan and consent was signed. Patient is being admitted for inpatient treatment for  surgery, pain control, PT, OT, prophylactic antibiotics, VTE prophylaxis, progressive ambulation and ADL's and discharge planning. The patient is planning to be discharged home with home health services

## 2012-12-22 ENCOUNTER — Encounter (HOSPITAL_COMMUNITY): Payer: Self-pay | Admitting: *Deleted

## 2012-12-22 MED ORDER — CEFAZOLIN SODIUM-DEXTROSE 2-3 GM-% IV SOLR
2.0000 g | INTRAVENOUS | Status: AC
  Administered 2012-12-23: 2 g via INTRAVENOUS
  Filled 2012-12-22: qty 50

## 2012-12-22 MED ORDER — LACTATED RINGERS IV SOLN: INTRAVENOUS | Status: DC

## 2012-12-22 MED ORDER — CHLORHEXIDINE GLUCONATE 4 % EX LIQD: 60.0000 mL | Freq: Once | CUTANEOUS | Status: DC

## 2012-12-23 ENCOUNTER — Ambulatory Visit (HOSPITAL_COMMUNITY): Payer: Managed Care, Other (non HMO) | Admitting: Anesthesiology

## 2012-12-23 ENCOUNTER — Inpatient Hospital Stay (HOSPITAL_COMMUNITY)
Admission: RE | Admit: 2012-12-23 | Discharge: 2012-12-25 | DRG: 470 | Disposition: A | Discharge status: Home / Self Care | Payer: Managed Care, Other (non HMO) | Source: Ambulatory Visit | Attending: Orthopaedic Surgery | Admitting: Orthopaedic Surgery

## 2012-12-23 ENCOUNTER — Encounter (HOSPITAL_COMMUNITY): Payer: Self-pay | Admitting: *Deleted

## 2012-12-23 ENCOUNTER — Encounter (HOSPITAL_COMMUNITY)
Admission: RE | Disposition: A | Discharge status: Home / Self Care | Payer: Self-pay | Source: Ambulatory Visit | Attending: Orthopaedic Surgery

## 2012-12-23 ENCOUNTER — Encounter (HOSPITAL_COMMUNITY): Payer: Self-pay | Admitting: Anesthesiology

## 2012-12-23 DIAGNOSIS — M1711 Unilateral primary osteoarthritis, right knee: Secondary | ICD-10-CM

## 2012-12-23 DIAGNOSIS — I10 Essential (primary) hypertension: Secondary | ICD-10-CM | POA: Diagnosis present

## 2012-12-23 DIAGNOSIS — Z87442 Personal history of urinary calculi: Secondary | ICD-10-CM

## 2012-12-23 DIAGNOSIS — K219 Gastro-esophageal reflux disease without esophagitis: Secondary | ICD-10-CM | POA: Diagnosis present

## 2012-12-23 DIAGNOSIS — F411 Generalized anxiety disorder: Secondary | ICD-10-CM | POA: Diagnosis present

## 2012-12-23 DIAGNOSIS — E119 Type 2 diabetes mellitus without complications: Secondary | ICD-10-CM | POA: Diagnosis present

## 2012-12-23 DIAGNOSIS — M1710 Unilateral primary osteoarthritis, unspecified knee: Principal | ICD-10-CM | POA: Diagnosis present

## 2012-12-23 DIAGNOSIS — Z886 Allergy status to analgesic agent status: Secondary | ICD-10-CM

## 2012-12-23 HISTORY — DX: Unilateral primary osteoarthritis, right knee: M17.11

## 2012-12-23 HISTORY — PX: TOTAL KNEE ARTHROPLASTY: SHX125

## 2012-12-23 LAB — GLUCOSE, CAPILLARY
Glucose-Capillary: 172 mg/dL — ABNORMAL HIGH (ref 70–99)
Glucose-Capillary: 218 mg/dL — ABNORMAL HIGH (ref 70–99)
Glucose-Capillary: 269 mg/dL — ABNORMAL HIGH (ref 70–99)

## 2012-12-23 SURGERY — ARTHROPLASTY, KNEE, TOTAL
Anesthesia: Choice | Site: Knee | Laterality: Right | Wound class: Clean

## 2012-12-23 MED ORDER — ALUM & MAG HYDROXIDE-SIMETH 200-200-20 MG/5ML PO SUSP: 30.0000 mL | ORAL | Status: DC | PRN

## 2012-12-23 MED ORDER — CEFAZOLIN SODIUM-DEXTROSE 2-3 GM-% IV SOLR
2.0000 g | Freq: Four times a day (QID) | INTRAVENOUS | Status: AC
  Administered 2012-12-23 (×2): 2 g via INTRAVENOUS
  Filled 2012-12-23 (×2): qty 50

## 2012-12-23 MED ORDER — LACTATED RINGERS IV SOLN
INTRAVENOUS | Status: DC | PRN
  Administered 2012-12-23 (×2): via INTRAVENOUS

## 2012-12-23 MED ORDER — PIOGLITAZONE HCL-METFORMIN HCL 15-850 MG PO TABS: 1.0000 | ORAL_TABLET | Freq: Two times a day (BID) | ORAL | Status: DC

## 2012-12-23 MED ORDER — ASPIRIN EC 325 MG PO TBEC
325.0000 mg | DELAYED_RELEASE_TABLET | Freq: Two times a day (BID) | ORAL | Status: DC
  Administered 2012-12-24 – 2012-12-25 (×3): 325 mg via ORAL
  Filled 2012-12-23 (×5): qty 1

## 2012-12-23 MED ORDER — LISINOPRIL 20 MG PO TABS
20.0000 mg | ORAL_TABLET | Freq: Every day | ORAL | Status: DC
  Administered 2012-12-23 – 2012-12-25 (×2): 20 mg via ORAL
  Filled 2012-12-23 (×3): qty 1

## 2012-12-23 MED ORDER — DEXAMETHASONE SODIUM PHOSPHATE 4 MG/ML IJ SOLN
INTRAMUSCULAR | Status: DC | PRN
  Administered 2012-12-23: 4 mg via INTRAVENOUS

## 2012-12-23 MED ORDER — ONDANSETRON HCL 4 MG PO TABS: 4.0000 mg | ORAL_TABLET | Freq: Four times a day (QID) | ORAL | Status: DC | PRN

## 2012-12-23 MED ORDER — FERROUS SULFATE 325 (65 FE) MG PO TABS
325.0000 mg | ORAL_TABLET | Freq: Every day | ORAL | Status: DC
  Administered 2012-12-24 – 2012-12-25 (×2): 325 mg via ORAL
  Filled 2012-12-23 (×3): qty 1

## 2012-12-23 MED ORDER — ONDANSETRON HCL 4 MG/2ML IJ SOLN
INTRAMUSCULAR | Status: DC | PRN
  Administered 2012-12-23: 4 mg via INTRAVENOUS

## 2012-12-23 MED ORDER — BUPIVACAINE-EPINEPHRINE PF 0.5-1:200000 % IJ SOLN
INTRAMUSCULAR | Status: DC | PRN
  Administered 2012-12-23: 25 mL

## 2012-12-23 MED ORDER — PHENYLEPHRINE HCL 10 MG/ML IJ SOLN
INTRAMUSCULAR | Status: DC | PRN
  Administered 2012-12-23: 80 ug via INTRAVENOUS

## 2012-12-23 MED ORDER — MIDAZOLAM HCL 5 MG/5ML IJ SOLN
INTRAMUSCULAR | Status: DC | PRN
  Administered 2012-12-23: 2 mg via INTRAVENOUS

## 2012-12-23 MED ORDER — METOCLOPRAMIDE HCL 5 MG/ML IJ SOLN: 5.0000 mg | Freq: Three times a day (TID) | INTRAMUSCULAR | Status: DC | PRN

## 2012-12-23 MED ORDER — ACETAMINOPHEN 325 MG PO TABS: 650.0000 mg | ORAL_TABLET | Freq: Four times a day (QID) | ORAL | Status: DC | PRN

## 2012-12-23 MED ORDER — OXYCODONE HCL 5 MG PO TABS
5.0000 mg | ORAL_TABLET | Freq: Once | ORAL | Status: DC | PRN
Start: 2012-12-23 — End: 2012-12-23

## 2012-12-23 MED ORDER — HYDROCHLOROTHIAZIDE 12.5 MG PO CAPS
12.5000 mg | ORAL_CAPSULE | Freq: Every day | ORAL | Status: DC
  Administered 2012-12-23 – 2012-12-25 (×2): 12.5 mg via ORAL
  Filled 2012-12-23 (×3): qty 1

## 2012-12-23 MED ORDER — METHOCARBAMOL 500 MG PO TABS
500.0000 mg | ORAL_TABLET | Freq: Four times a day (QID) | ORAL | Status: DC | PRN
  Administered 2012-12-23 – 2012-12-25 (×4): 500 mg via ORAL
  Filled 2012-12-23 (×4): qty 1

## 2012-12-23 MED ORDER — HYDROMORPHONE HCL PF 1 MG/ML IJ SOLN: 0.2500 mg | INTRAMUSCULAR | Status: DC | PRN

## 2012-12-23 MED ORDER — OXYCODONE HCL 5 MG/5ML PO SOLN: 5.0000 mg | Freq: Once | ORAL | Status: DC | PRN

## 2012-12-23 MED ORDER — METOCLOPRAMIDE HCL 10 MG PO TABS: 5.0000 mg | ORAL_TABLET | Freq: Three times a day (TID) | ORAL | Status: DC | PRN

## 2012-12-23 MED ORDER — MORPHINE SULFATE 10 MG/ML IJ SOLN
INTRAMUSCULAR | Status: DC | PRN
  Administered 2012-12-23: 5 mg via INTRAVENOUS

## 2012-12-23 MED ORDER — HYDROMORPHONE HCL PF 1 MG/ML IJ SOLN
0.5000 mg | INTRAMUSCULAR | Status: DC | PRN
  Administered 2012-12-23 – 2012-12-25 (×5): 1 mg via INTRAVENOUS
  Filled 2012-12-23 (×4): qty 1

## 2012-12-23 MED ORDER — ACETAMINOPHEN 650 MG RE SUPP: 650.0000 mg | Freq: Four times a day (QID) | RECTAL | Status: DC | PRN

## 2012-12-23 MED ORDER — LACTATED RINGERS IV SOLN
INTRAVENOUS | Status: DC
  Administered 2012-12-23: 14:00:00 via INTRAVENOUS

## 2012-12-23 MED ORDER — METFORMIN HCL 850 MG PO TABS
850.0000 mg | ORAL_TABLET | Freq: Two times a day (BID) | ORAL | Status: DC
  Administered 2012-12-23 – 2012-12-25 (×4): 850 mg via ORAL
  Filled 2012-12-23 (×6): qty 1

## 2012-12-23 MED ORDER — HYDROMORPHONE HCL PF 1 MG/ML IJ SOLN
INTRAMUSCULAR | Status: AC
  Filled 2012-12-23: qty 1

## 2012-12-23 MED ORDER — DEXTROSE 5 % IV SOLN
500.0000 mg | Freq: Four times a day (QID) | INTRAVENOUS | Status: DC | PRN
  Filled 2012-12-23: qty 5

## 2012-12-23 MED ORDER — INSULIN ASPART 100 UNIT/ML ~~LOC~~ SOLN
0.0000 [IU] | Freq: Three times a day (TID) | SUBCUTANEOUS | Status: DC
  Administered 2012-12-23: 8 [IU] via SUBCUTANEOUS
  Administered 2012-12-24 – 2012-12-25 (×4): 3 [IU] via SUBCUTANEOUS
  Administered 2012-12-25: 5 [IU] via SUBCUTANEOUS

## 2012-12-23 MED ORDER — ONDANSETRON HCL 4 MG/2ML IJ SOLN
4.0000 mg | Freq: Four times a day (QID) | INTRAMUSCULAR | Status: DC | PRN
  Administered 2012-12-24: 4 mg via INTRAVENOUS
  Filled 2012-12-23: qty 2

## 2012-12-23 MED ORDER — PHENOL 1.4 % MT LIQD: 1.0000 | OROMUCOSAL | Status: DC | PRN

## 2012-12-23 MED ORDER — LIDOCAINE HCL (CARDIAC) 20 MG/ML IV SOLN
INTRAVENOUS | Status: DC | PRN
  Administered 2012-12-23: 50 mg via INTRAVENOUS

## 2012-12-23 MED ORDER — LINAGLIPTIN 5 MG PO TABS
5.0000 mg | ORAL_TABLET | Freq: Every day | ORAL | Status: DC
  Administered 2012-12-23 – 2012-12-25 (×3): 5 mg via ORAL
  Filled 2012-12-23 (×3): qty 1

## 2012-12-23 MED ORDER — INSULIN ASPART 100 UNIT/ML ~~LOC~~ SOLN
0.0000 [IU] | Freq: Every day | SUBCUTANEOUS | Status: DC
  Administered 2012-12-23: 2 [IU] via SUBCUTANEOUS

## 2012-12-23 MED ORDER — MENTHOL 3 MG MT LOZG: 1.0000 | LOZENGE | OROMUCOSAL | Status: DC | PRN

## 2012-12-23 MED ORDER — HYDROCODONE-ACETAMINOPHEN 7.5-325 MG PO TABS
1.0000 | ORAL_TABLET | ORAL | Status: DC | PRN
  Administered 2012-12-23 – 2012-12-25 (×7): 2 via ORAL
  Administered 2012-12-25 (×3): 1 via ORAL
  Filled 2012-12-23: qty 2
  Filled 2012-12-23: qty 1
  Filled 2012-12-23 (×6): qty 2
  Filled 2012-12-23: qty 1
  Filled 2012-12-23 (×2): qty 2

## 2012-12-23 MED ORDER — ALPRAZOLAM 0.5 MG PO TABS
0.5000 mg | ORAL_TABLET | Freq: Every day | ORAL | Status: DC
  Administered 2012-12-23 – 2012-12-25 (×3): 0.5 mg via ORAL
  Filled 2012-12-23 (×3): qty 1

## 2012-12-23 MED ORDER — DIPHENHYDRAMINE HCL 12.5 MG/5ML PO ELIX: 12.5000 mg | ORAL_SOLUTION | ORAL | Status: DC | PRN

## 2012-12-23 MED ORDER — GLIPIZIDE 10 MG PO TABS
10.0000 mg | ORAL_TABLET | Freq: Two times a day (BID) | ORAL | Status: DC
  Administered 2012-12-23 – 2012-12-25 (×4): 10 mg via ORAL
  Filled 2012-12-23 (×6): qty 1

## 2012-12-23 MED ORDER — PROPOFOL 10 MG/ML IV BOLUS
INTRAVENOUS | Status: DC | PRN
  Administered 2012-12-23: 125 mg via INTRAVENOUS

## 2012-12-23 MED ORDER — LISINOPRIL-HYDROCHLOROTHIAZIDE 20-12.5 MG PO TABS: 1.0000 | ORAL_TABLET | Freq: Every day | ORAL | Status: DC

## 2012-12-23 MED ORDER — PROPOFOL INFUSION 10 MG/ML OPTIME
INTRAVENOUS | Status: DC | PRN
  Administered 2012-12-23: 100 ug/kg/min via INTRAVENOUS

## 2012-12-23 MED ORDER — PIOGLITAZONE HCL 15 MG PO TABS
15.0000 mg | ORAL_TABLET | Freq: Two times a day (BID) | ORAL | Status: DC
  Administered 2012-12-23 – 2012-12-25 (×4): 15 mg via ORAL
  Filled 2012-12-23 (×6): qty 1

## 2012-12-23 MED ORDER — FENTANYL CITRATE 0.05 MG/ML IJ SOLN
INTRAMUSCULAR | Status: DC | PRN
  Administered 2012-12-23 (×3): 50 ug via INTRAVENOUS

## 2012-12-23 MED ORDER — SODIUM CHLORIDE 0.9 % IR SOLN
Status: DC | PRN
  Administered 2012-12-23: 1000 mL

## 2012-12-23 SURGICAL SUPPLY — 66 items
APL SKNCLS STERI-STRIP NONHPOA (GAUZE/BANDAGES/DRESSINGS) ×1
BANDAGE ELASTIC 4 VELCRO ST LF (GAUZE/BANDAGES/DRESSINGS) ×2 IMPLANT
BANDAGE ELASTIC 6 VELCRO ST LF (GAUZE/BANDAGES/DRESSINGS) ×1 IMPLANT
BANDAGE ESMARK 6X9 LF (GAUZE/BANDAGES/DRESSINGS) ×1 IMPLANT
BANDAGE GAUZE ELAST BULKY 4 IN (GAUZE/BANDAGES/DRESSINGS) ×3 IMPLANT
BENZOIN TINCTURE PRP APPL 2/3 (GAUZE/BANDAGES/DRESSINGS) ×1 IMPLANT
BLADE SAGITTAL 25.0X1.19X90 (BLADE) ×2 IMPLANT
BLADE SURG ROTATE 9660 (MISCELLANEOUS) IMPLANT
BNDG CMPR 9X6 STRL LF SNTH (GAUZE/BANDAGES/DRESSINGS) ×1
BNDG CMPR MED 10X6 ELC LF (GAUZE/BANDAGES/DRESSINGS) ×2
BNDG ELASTIC 6X10 VLCR STRL LF (GAUZE/BANDAGES/DRESSINGS) ×3 IMPLANT
BNDG ESMARK 6X9 LF (GAUZE/BANDAGES/DRESSINGS) ×2
BOWL SMART MIX CTS (DISPOSABLE) ×2 IMPLANT
CEMENT HV SMART SET (Cement) ×4 IMPLANT
CLOTH BEACON ORANGE TIMEOUT ST (SAFETY) ×2 IMPLANT
CLSR STERI-STRIP ANTIMIC 1/2X4 (GAUZE/BANDAGES/DRESSINGS) ×2 IMPLANT
COVER SURGICAL LIGHT HANDLE (MISCELLANEOUS) ×2 IMPLANT
CUFF TOURNIQUET SINGLE 34IN LL (TOURNIQUET CUFF) ×2 IMPLANT
CUFF TOURNIQUET SINGLE 44IN (TOURNIQUET CUFF) IMPLANT
DRAPE EXTREMITY T 121X128X90 (DRAPE) ×2 IMPLANT
DRAPE PROXIMA HALF (DRAPES) ×2 IMPLANT
DRAPE U-SHAPE 47X51 STRL (DRAPES) ×2 IMPLANT
DRSG ADAPTIC 3X8 NADH LF (GAUZE/BANDAGES/DRESSINGS) ×2 IMPLANT
DRSG PAD ABDOMINAL 8X10 ST (GAUZE/BANDAGES/DRESSINGS) ×2 IMPLANT
DURAPREP 26ML APPLICATOR (WOUND CARE) ×2 IMPLANT
ELECT REM PT RETURN 9FT ADLT (ELECTROSURGICAL) ×2
ELECTRODE REM PT RTRN 9FT ADLT (ELECTROSURGICAL) ×1 IMPLANT
FACESHIELD LNG OPTICON STERILE (SAFETY) ×4 IMPLANT
GLOVE BIO SURGEON STRL SZ8.5 (GLOVE) ×2 IMPLANT
GLOVE BIOGEL PI IND STRL 8 (GLOVE) ×1 IMPLANT
GLOVE BIOGEL PI IND STRL 8.5 (GLOVE) ×1 IMPLANT
GLOVE BIOGEL PI INDICATOR 8 (GLOVE) ×1
GLOVE BIOGEL PI INDICATOR 8.5 (GLOVE) ×1
GLOVE SS BIOGEL STRL SZ 8 (GLOVE) ×1 IMPLANT
GLOVE SUPERSENSE BIOGEL SZ 8 (GLOVE) ×1
GOWN PREVENTION PLUS XLARGE (GOWN DISPOSABLE) ×2 IMPLANT
GOWN PREVENTION PLUS XXLARGE (GOWN DISPOSABLE) ×2 IMPLANT
GOWN STRL NON-REIN LRG LVL3 (GOWN DISPOSABLE) ×2 IMPLANT
HANDPIECE INTERPULSE COAX TIP (DISPOSABLE) ×2
HOOD PEEL AWAY FACE SHEILD DIS (HOOD) ×2 IMPLANT
IMMOBILIZER KNEE 20 (SOFTGOODS)
IMMOBILIZER KNEE 20 THIGH 36 (SOFTGOODS) IMPLANT
IMMOBILIZER KNEE 22 UNIV (SOFTGOODS) ×2 IMPLANT
IMMOBILIZER KNEE 24 THIGH 36 (MISCELLANEOUS) IMPLANT
IMMOBILIZER KNEE 24 UNIV (MISCELLANEOUS)
KIT BASIN OR (CUSTOM PROCEDURE TRAY) ×2 IMPLANT
KIT ROOM TURNOVER OR (KITS) ×2 IMPLANT
MANIFOLD NEPTUNE II (INSTRUMENTS) ×2 IMPLANT
NS IRRIG 1000ML POUR BTL (IV SOLUTION) ×2 IMPLANT
PACK TOTAL JOINT (CUSTOM PROCEDURE TRAY) ×2 IMPLANT
PAD ARMBOARD 7.5X6 YLW CONV (MISCELLANEOUS) ×4 IMPLANT
SET HNDPC FAN SPRY TIP SCT (DISPOSABLE) ×1 IMPLANT
SPONGE GAUZE 4X4 12PLY (GAUZE/BANDAGES/DRESSINGS) ×2 IMPLANT
STAPLER VISISTAT 35W (STAPLE) ×1 IMPLANT
STRIP CLOSURE SKIN 1/2X4 (GAUZE/BANDAGES/DRESSINGS) ×2 IMPLANT
SUCTION FRAZIER TIP 10 FR DISP (SUCTIONS) ×1 IMPLANT
SUT MNCRL AB 3-0 PS2 18 (SUTURE) IMPLANT
SUT VIC AB 0 CT1 27 (SUTURE) ×4
SUT VIC AB 0 CT1 27XBRD ANBCTR (SUTURE) ×2 IMPLANT
SUT VIC AB 2-0 CT1 27 (SUTURE) ×4
SUT VIC AB 2-0 CT1 TAPERPNT 27 (SUTURE) ×2 IMPLANT
SUT VLOC 180 0 24IN GS25 (SUTURE) ×2 IMPLANT
TOWEL OR 17X24 6PK STRL BLUE (TOWEL DISPOSABLE) ×2 IMPLANT
TOWEL OR 17X26 10 PK STRL BLUE (TOWEL DISPOSABLE) ×2 IMPLANT
TRAY FOLEY CATH 14FR (SET/KITS/TRAYS/PACK) ×2 IMPLANT
WATER STERILE IRR 1000ML POUR (IV SOLUTION) ×4 IMPLANT

## 2012-12-23 NOTE — Anesthesia Preprocedure Evaluation (Addendum)
Anesthesia Evaluation  Patient identified by MRN, date of birth, ID band Patient awake    Reviewed: Allergy & Precautions, H&P , NPO status , Patient's Chart, lab work & pertinent test results  History of Anesthesia Complications (+) PONV  Airway Mallampati: II TM Distance: >3 FB Neck ROM: Full    Dental  (+) Teeth Intact and Dental Advidsory Given   Pulmonary  breath sounds clear to auscultation        Cardiovascular hypertension, On Medications Rhythm:Regular Rate:Normal     Neuro/Psych Anxiety    GI/Hepatic GERD-  Medicated and Controlled,  Endo/Other  diabetes, Well Controlled, Type 2  Renal/GU      Musculoskeletal   Abdominal (+) + obese,   Peds  Hematology   Anesthesia Other Findings   Reproductive/Obstetrics                          Anesthesia Physical Anesthesia Plan  ASA: III  Anesthesia Plan: Spinal   Post-op Pain Management:    Induction: Intravenous  Airway Management Planned: Simple Face Mask  Additional Equipment:   Intra-op Plan:   Post-operative Plan:   Informed Consent: I have reviewed the patients History and Physical, chart, labs and discussed the procedure including the risks, benefits and alternatives for the proposed anesthesia with the patient or authorized representative who has indicated his/her understanding and acceptance.   Dental Advisory Given  Plan Discussed with: CRNA and Surgeon  Anesthesia Plan Comments:        Anesthesia Quick Evaluation

## 2012-12-23 NOTE — Anesthesia Procedure Notes (Addendum)
Anesthesia Regional Block:  Femoral nerve block  Pre-Anesthetic Checklist: ,, timeout performed, Correct Patient, Correct Site, Correct Laterality, Correct Procedure, Correct Position, site marked, Risks and benefits discussed,  Surgical consent,  Pre-op evaluation,  At surgeon's request and post-op pain management  Laterality: Right  Prep: chloraprep       Needles:  Injection technique: Single-shot  Needle Type: Echogenic Stimulator Needle     Needle Length: 5cm 5 cm Needle Gauge: 22 and 22 G    Additional Needles:  Procedures: nerve stimulator Femoral nerve block  Nerve Stimulator or Paresthesia:  Response: 0.48 mA,   Additional Responses:   Narrative:  Start time: 12/23/2012 7:06 AM End time: 12/23/2012 7:17 AM Injection made incrementally with aspirations every 5 mL. Anesthesiologist: Dr Gypsy Balsam  Additional Notes: 1610-9604 R FNB POP CHG prep, sterile tech #22 stim needle w/stim down to .48ma Multiple neg asp Marc .5% w/epi 1:200000 total 25cc+decadron 4mg  infiltrated No compl Dr Gypsy Balsam  Femoral nerve block Procedure Name: MAC Date/Time: 12/23/2012 7:35 AM Performed by: Carmela Rima Pre-anesthesia Checklist: Patient identified, Timeout performed, Emergency Drugs available, Suction available and Patient being monitored Patient Re-evaluated:Patient Re-evaluated prior to inductionOxygen Delivery Method: Simple face mask Placement Confirmation: positive ETCO2    Spinal  Patient location during procedure: OR Start time: 12/23/2012 7:30 AM End time: 12/23/2012 7:39 AM Reason for block: at surgeon's request Preanesthetic Checklist Completed: patient identified, site marked, surgical consent, pre-op evaluation, timeout performed, IV checked, risks and benefits discussed, monitors and equipment checked and at surgeon's request Spinal Block Patient position: sitting Prep: Betadine Patient monitoring: cardiac monitor, continuous pulse ox and blood  pressure Approach: midline Location: L3-4 Injection technique: single-shot Needle Needle gauge: 22 G Assessment Sensory level: T12 Additional Notes 0730-0739 Sitting position Betadine prep, sterile tech #22 spinal needle w/clear CSF, no paresthesia Vernia Buff .75% 14mg  Head slight elevated No compl Dr Gypsy Balsam  Procedure Name: LMA Insertion Date/Time: 12/23/2012 8:35 AM Performed by: Carmela Rima Pre-anesthesia Checklist: Patient identified, Timeout performed, Emergency Drugs available, Suction available and Patient being monitored Patient Re-evaluated:Patient Re-evaluated prior to inductionOxygen Delivery Method: Circle system utilized Preoxygenation: Pre-oxygenation with 100% oxygen Intubation Type: IV induction Ventilation: Mask ventilation without difficulty LMA: LMA inserted LMA Size: 5.0 Number of attempts: 1 Placement Confirmation: positive ETCO2 and breath sounds checked- equal and bilateral Tube secured with: Tape Dental Injury: Teeth and Oropharynx as per pre-operative assessment

## 2012-12-23 NOTE — Anesthesia Postprocedure Evaluation (Signed)
  Anesthesia Post-op Note  Patient: Sean Horn  Procedure(s) Performed: Procedure(s) with comments: TOTAL KNEE ARTHROPLASTY (Right) - DEPUY, RNFA  Patient Location: PACU  Anesthesia Type:Spinal and GA combined with regional for post-op pain  Level of Consciousness: awake and alert   Airway and Oxygen Therapy: Patient Spontanous Breathing  Post-op Pain: none  Post-op Assessment: Post-op Vital signs reviewed, Patient's Cardiovascular Status Stable, Respiratory Function Stable, Patent Airway, No signs of Nausea or vomiting and Pain level controlled  Post-op Vital Signs: stable  Complications: No apparent anesthesia complications

## 2012-12-23 NOTE — Transfer of Care (Signed)
Immediate Anesthesia Transfer of Care Note  Patient: Sean Horn  Procedure(s) Performed: Procedure(s) with comments: TOTAL KNEE ARTHROPLASTY (Right) - DEPUY, RNFA  Patient Location: PACU  Anesthesia Type:General  Level of Consciousness: awake, alert  and oriented  Airway & Oxygen Therapy: Patient Spontanous Breathing and Patient connected to nasal cannula oxygen  Post-op Assessment: Report given to PACU RN, Post -op Vital signs reviewed and stable and Patient moving all extremities X 4  Post vital signs: Reviewed and stable  Complications: No apparent anesthesia complications

## 2012-12-23 NOTE — Preoperative (Signed)
Beta Blockers   Reason not to administer Beta Blockers:Not Applicable 

## 2012-12-23 NOTE — Plan of Care (Signed)
Problem: Consults Goal: Diagnosis- Total Joint Replacement Primary Total Knee Right     

## 2012-12-23 NOTE — Evaluation (Signed)
Physical Therapy Evaluation Patient Details Name: Sean Horn MRN: [id number] DOB: 1960-10-13 Today's Date: 12/23/2012 Time: 8756-4332 PT Time Calculation (min): 19 min  PT Assessment / Plan / Recommendation Clinical Impression  Pt is a 52 y.o. male s/p R TKA POD#0. Presents with deficits with mobility, gt, strength and ROM in R LE. WIll benefit from skilled PT to maximize functional mobility. Anticpate fast progress and anticipate D/C home with family and HHPT when medically ready.     PT Assessment  Patient needs continued PT services    Follow Up Recommendations  Home health PT;Supervision/Assistance - 24 hour    Does the patient have the potential to tolerate intense rehabilitation      Barriers to Discharge None      Equipment Recommendations  None recommended by PT    Recommendations for Other Services     Frequency 7X/week    Precautions / Restrictions Precautions Precautions: Fall;Knee Precaution Booklet Issued: Yes (comment) Required Braces or Orthoses: Knee Immobilizer - Right Knee Immobilizer - Right: Other (comment) (until discontinued by MD) Restrictions Weight Bearing Restrictions: Yes RLE Weight Bearing: Weight bearing as tolerated   Pertinent Vitals/Pain 8/10 pt repositioned in chair and RN notified.       Mobility  Bed Mobility Bed Mobility: Supine to Sit;Sitting - Scoot to Edge of Bed Supine to Sit: 4: Min guard;HOB elevated;With rails Sitting - Scoot to Edge of Bed: 4: Min guard Details for Bed Mobility Assistance: verbal cues for hand placement and sequencing  Transfers Transfers: Sit to Stand;Stand to Sit;Stand Pivot Transfers Sit to Stand: 4: Min assist;From bed;From elevated surface Stand to Sit: 4: Min assist;To chair/3-in-1;With upper extremity assist Stand Pivot Transfers: 4: Min assist;From elevated surface Details for Transfer Assistance: pt unable to amb due to R LE buckling; required assistance to pivot to chair to maintain  balance and advance RW; verbal cues for hand placement and sequencing Ambulation/Gait Ambulation/Gait Assistance: Not tested (comment) (unable due to R LE buckling ) Stairs: No Wheelchair Mobility Wheelchair Mobility: No    Exercises Total Joint Exercises Ankle Circles/Pumps: AROM;Both;10 reps;Seated Long Arc Quad: AROM;Right;10 reps;Seated Knee Flexion: AAROM;Right;10 reps;Seated Goniometric ROM: -2 to 80 degrees in sitting AROM   PT Diagnosis: Difficulty walking;Acute pain  PT Problem List: Decreased strength;Decreased range of motion;Decreased balance;Decreased mobility;Decreased knowledge of use of DME;Pain PT Treatment Interventions: DME instruction;Gait training;Stair training;Functional mobility training;Therapeutic activities;Therapeutic exercise;Balance training;Neuromuscular re-education;Patient/family education   PT Goals Acute Rehab PT Goals PT Goal Formulation: With patient Time For Goal Achievement: 12/28/12 Potential to Achieve Goals: Good Pt will go Supine/Side to Sit: with modified independence PT Goal: Supine/Side to Sit - Progress: Goal set today Pt will go Sit to Supine/Side: with modified independence PT Goal: Sit to Supine/Side - Progress: Goal set today Pt will go Sit to Stand: with modified independence PT Goal: Sit to Stand - Progress: Goal set today Pt will go Stand to Sit: with modified independence PT Goal: Stand to Sit - Progress: Goal set today Pt will Ambulate: >150 feet;with modified independence;with rolling walker PT Goal: Ambulate - Progress: Goal set today Pt will Go Up / Down Stairs: 3-5 stairs;with supervision;with least restrictive assistive device;with rail(s) PT Goal: Up/Down Stairs - Progress: Goal set today  Visit Information  Last PT Received On: 12/23/12 Assistance Needed: +1    Subjective Data  Subjective: Pt lying supine with family present; agreeable to therapy Patient Stated Goal: home as soon as possible   Prior  Functioning  Home Living Lives With:  Spouse;Son Available Help at Discharge: Family;Available 24 hours/day Type of Home: House Home Access: Stairs to enter [employer] of Steps: 3 Entrance Stairs-Rails: Can reach both Home Layout: Two level;Able to live on main level with bedroom/bathroom Bathroom Shower/Tub: Walk-in shower;Door [employer] Toilet: Standard Bathroom Accessibility: Yes How Accessible: Accessible via walker Home Adaptive Equipment: Bedside commode/3-in-1;Walker - rolling Prior Function Level of Independence: Independent Able to Take Stairs?: Yes Driving: Yes Vocation: Full time employment Communication Communication: No difficulties Dominant Hand: Right    Cognition  Cognition Arousal/Alertness: Awake/alert Behavior During Therapy: WFL for tasks assessed/performed Overall Cognitive Status: Within Functional Limits for tasks assessed    Extremity/Trunk Assessment Right Lower Extremity Assessment RLE ROM/Strength/Tone: Deficits;Due to pain;Unable to fully assess RLE ROM/Strength/Tone Deficits: grossly 3-/5 in knee; DF/PF WFL  RLE Sensation: Deficits RLE Sensation Deficits: diminshed to light touch  Left Lower Extremity Assessment LLE ROM/Strength/Tone: WFL for tasks assessed LLE Sensation: WFL - Light Touch Trunk Assessment Trunk Assessment: Normal   Balance Balance Balance Assessed: Yes Static Sitting Balance Static Sitting - Balance Support: No upper extremity supported;Feet supported Static Sitting - Level of Assistance: 6: Modified independent (Device/Increase time)  End of Session PT - End of Session Equipment Utilized During Treatment: Gait belt;Right knee immobilizer Activity Tolerance: Patient tolerated treatment well Patient left: in chair;with call bell/phone within reach;with family/visitor present Nurse Communication: Mobility status CPM Right Knee CPM Right Knee: On Right Knee Flexion (Degrees): 60 Right Knee Extension (Degrees): 0   GP     Shelva Majestic [place], Bernice [phone] 12/23/2012, 3:16 PM

## 2012-12-23 NOTE — Op Note (Signed)
PREOP DIAGNOSIS: DJD RIGHT KNEE POSTOP DIAGNOSIS: DJD RIGHT KNEE PROCEDURE: RIGHT TKR ANESTHESIA: General and block ATTENDING SURGEON: Barkley Kratochvil G ASSISTANT: Lindwood Qua PA and April Green RNFA  INDICATIONS FOR PROCEDURE: Sean Horn is a 52 y.o. male who has struggled for a long time with pain due to degenerative arthritis of the right knee.  The patient has failed many conservative non-operative measures and at this point has pain which limits the ability to sleep and walk.  The patient is offered total knee replacement.  Informed operative consent was obtained after discussion of possible risks of anesthesia, infection, neurovascular injury, DVT, and death.  The importance of the post-operative rehabilitation protocol to optimize result was stressed extensively with the patient.  SUMMARY OF FINDINGS AND PROCEDURE:  SAMER DUTTON was taken to the operative suite where under the above anesthesia a right knee replacement was performed.  There were advanced degenerative changes and the bone quality was excellent.  We used the DePuy system and placed size standard plus femur, 4 tibia, 38 mm all polyethylene patella, and a size 12.5 mm spacer.  The patient was admitted for appropriate post-op care to include perioperative antibiotics and mechanical and pharmacologic measures for DVT prophylaxis.  DESCRIPTION OF PROCEDURE:  JILLIAN PIANKA was taken to the operative suite where the above anesthesia was applied.  The patient was positioned supine and prepped and draped in normal sterile fashion.  An appropriate time out was performed.  After the administration of Kefzol pre-op antibiotic the leg was elevated and exsanguinated and a tourniquet inflated. A standard longitudinal incision was made on the anterior knee.  Dissection was carried down to the extensor mechanism.  All appropriate anti-infective measures were used including the pre-operative antibiotic, betadine impregnated drape, and  closed hooded exhaust systems for each member of the surgical team.  A medial parapatellar incision was made in the extensor mechanism and the knee cap flipped and the knee flexed.  Some residual meniscal tissues were removed along with any remaining ACL/PCL tissue.  A guide was placed on the tibia and a flat cut was made on it's superior surface.  An intramedullary guide was placed in the femur and was utilized to make anterior and posterior cuts creating an appropriate flexion gap.  A second intramedullary guide was placed in the femur to make a distal cut properly balancing the knee with an extension gap equal to the flexion gap.  The three bones sized to the above mentioned sizes and the appropriate guides were placed and utilized.  A trial reduction was done and the knee easily came to full extension and the patella tracked well on flexion.  The trial components were removed and all bones were cleaned with pulsatile lavage and then dried thoroughly.  Cement was mixed and was pressurized onto the bones followed by placement of the aforementioned components.  Excess cement was trimmed and pressure was held on the components until the cement had hardened.  The tourniquet was deflated and a small amount of bleeding was controlled with cautery and pressure.  The knee was irrigated thoroughly.  The extensor mechanism was re-approximated with V-loc suture in running fashion.  The knee was flexed and the repair was solid.  The subcutaneous tissues were re-approximated with #0 and #2-0 vicryl and the skin closed with a subcuticular stitch and steristrips.  A sterile dressing was applied.  Intraoperative fluids, EBL, and tourniquet time can be obtained from anesthesia records.  DISPOSITION:  The patient was taken  to recovery room in stable condition and admitted for appropriate post-op care to include peri-operative antibiotic and DVT prophylaxis with mechanical and pharmacologic measures.  Alesi Zachery  G 12/23/2012, 9:13 AM

## 2012-12-23 NOTE — Interval H&P Note (Signed)
History and Physical Interval Note:  12/23/2012 7:23 AM  Sean Horn  has presented today for surgery, with the diagnosis of RIGHT Horn DEGENERATIVE JOINT DISEASE  The various methods of treatment have been discussed with the patient and family. After consideration of risks, benefits and other options for treatment, the patient has consented to  Procedure(s) with comments: TOTAL Horn ARTHROPLASTY (Right) - DEPUY, RNFA as a surgical intervention .  The patient's history has been reviewed, patient examined, no change in status, stable for surgery.  I have reviewed the patient's chart and labs.  Questions were answered to the patient's satisfaction.     Breaker Springer G

## 2012-12-24 ENCOUNTER — Encounter (HOSPITAL_COMMUNITY): Payer: Self-pay | Admitting: Orthopaedic Surgery

## 2012-12-24 LAB — BASIC METABOLIC PANEL
BUN: 14 mg/dL (ref 6–23)
Chloride: 96 mEq/L (ref 96–112)
Glucose, Bld: 157 mg/dL — ABNORMAL HIGH (ref 70–99)
Potassium: 4.1 mEq/L (ref 3.5–5.1)
Sodium: 132 mEq/L — ABNORMAL LOW (ref 135–145)

## 2012-12-24 LAB — CBC
HCT: 32.3 % — ABNORMAL LOW (ref 39.0–52.0)
Hemoglobin: 12 g/dL — ABNORMAL LOW (ref 13.0–17.0)
RBC: 3.75 MIL/uL — ABNORMAL LOW (ref 4.22–5.81)
WBC: 13.6 10*3/uL — ABNORMAL HIGH (ref 4.0–10.5)

## 2012-12-24 MED ORDER — HYDROCODONE-ACETAMINOPHEN 7.5-325 MG PO TABS: 1.0000 | ORAL_TABLET | ORAL | Status: DC | PRN

## 2012-12-24 MED ORDER — METHOCARBAMOL 500 MG PO TABS: 500.0000 mg | ORAL_TABLET | Freq: Four times a day (QID) | ORAL | Status: DC | PRN

## 2012-12-24 MED ORDER — ASPIRIN 325 MG PO TBEC: 325.0000 mg | DELAYED_RELEASE_TABLET | Freq: Two times a day (BID) | ORAL | Status: DC

## 2012-12-24 NOTE — Progress Notes (Signed)
Subjective: 1 Day Post-Op Procedure(s) (LRB): TOTAL KNEE ARTHROPLASTY (Right) Has been up with pt Activity level:  wbat Diet tolerance:  ok Voiding:  ok Patient reports pain as 1 on 0-10 scale.    Objective: Vital signs in last 24 hours: Temp:  [97.2 F (36.2 C)-98 F (36.7 C)] 97.8 F (36.6 C) (05/21 0705) Pulse Rate:  [62-115] 86 (05/21 0705) Resp:  [8-20] 20 (05/21 0705) BP: (111-156)/(66-94) 111/66 mmHg (05/21 0705) SpO2:  [95 %-100 %] 95 % (05/21 0705)  Labs:  Recent Labs  12/24/12 0615  HGB 12.0*    Recent Labs  12/24/12 0615  WBC 13.6*  RBC 3.75*  HCT 32.3*  PLT 249    Recent Labs  12/24/12 0615  NA 132*  K 4.1  CL 96  CO2 25  BUN 14  CREATININE 0.73  GLUCOSE 157*  CALCIUM 8.8   No results found for this basename: LABPT, INR,  in the last 72 hours  Physical Exam:  Neurologically intact ABD soft Neurovascular intact Sensation intact distally Intact pulses distally Dorsiflexion/Plantar flexion intact Incision: dressing C/D/I No cellulitis present Compartment soft  Assessment/Plan:  1 Day Post-Op Procedure(s) (LRB): TOTAL KNEE ARTHROPLASTY (Right) Advance diet Up with therapy D/C IV fluids Discharge home with home health thurs. ASA 325 bid x 2 weeks scd's    Monzerat Handler R 12/24/2012, 8:03 AM

## 2012-12-24 NOTE — Progress Notes (Signed)
Physical Therapy Treatment Patient Details Name: Sean Horn MRN: [id number] DOB: 1961-07-27 Today's Date: 12/24/2012 Time: 1421-1440 PT Time Calculation (min): 19 min  PT Assessment / Plan / Recommendation Comments on Treatment Session  Patient progressing well. Will attempt stair training again tomorrow. Did have one large buckle of knee on step with Mod A to correct and maintain balance to prevent fall.     Follow Up Recommendations  Home health PT;Supervision/Assistance - 24 hour     Does the patient have the potential to tolerate intense rehabilitation     Barriers to Discharge        Equipment Recommendations  None recommended by PT    Recommendations for Other Services    Frequency 7X/week   Plan Discharge plan remains appropriate;Frequency remains appropriate    Precautions / Restrictions Precautions Precautions: Knee Required Braces or Orthoses: Knee Immobilizer - Right Knee Immobilizer - Right: Other (comment) (until discontinued by MD) Restrictions Weight Bearing Restrictions: Yes RLE Weight Bearing: Weight bearing as tolerated   Pertinent Vitals/Pain     Mobility  Bed Mobility Bed Mobility: Sit to Supine Supine to Sit: 6: Modified independent (Device/Increase time) Sitting - Scoot to Edge of Bed: 6: Modified independent (Device/Increase time) Sit to Supine: 4: Min assist Details for Bed Mobility Assistance: A for R LE Transfers Sit to Stand: 6: Modified independent (Device/Increase time) Stand to Sit: 6: Modified independent (Device/Increase time) Details for Transfer Assistance: vc's for hand placement Ambulation/Gait Ambulation/Gait Assistance: 5: Supervision Ambulation Distance (Feet): 250 Feet Assistive device: Rolling walker Ambulation/Gait Assistance Details: Cues for sequency, RW management and posture Gait Pattern: Step-to pattern Stairs: Yes Stairs Assistance: 3: Mod assist Stairs Assistance Details (indicate cue type and reason): A for  balance as patient with large buckling of knee on step. Will need to reattempt next session Stair Management Technique: One rail Left;Step to pattern;Sideways Number of Stairs: 1    Exercises Total Joint Exercises Quad Sets: AROM;Right;10 reps Heel Slides: AROM;Right;10 reps Hip ABduction/ADduction: AROM;Right;10 reps Straight Leg Raises: AAROM;Right;10 reps Long Arc Quad: AROM;Right;10 reps Goniometric ROM: AROM 90 flexion   PT Diagnosis:    PT Problem List:   PT Treatment Interventions:     PT Goals Acute Rehab PT Goals PT Goal: Supine/Side to Sit - Progress: Progressing toward goal PT Goal: Sit to Supine/Side - Progress: Progressing toward goal PT Goal: Sit to Stand - Progress: Progressing toward goal PT Goal: Stand to Sit - Progress: Progressing toward goal PT Goal: Ambulate - Progress: Progressing toward goal PT Goal: Up/Down Stairs - Progress: Progressing toward goal  Visit Information  Last PT Received On: 12/24/12 Assistance Needed: +1    Subjective Data      Cognition  Cognition Arousal/Alertness: Awake/alert Behavior During Therapy: WFL for tasks assessed/performed Overall Cognitive Status: Within Functional Limits for tasks assessed    Balance     End of Session PT - End of Session Equipment Utilized During Treatment: Gait belt Activity Tolerance: Patient tolerated treatment well Patient left: in bed;in CPM;with call bell/phone within reach Nurse Communication: Mobility status CPM Right Knee Right Knee Flexion (Degrees): 70 Right Knee Extension (Degrees): 0   GP     Shanterria Franta, Adline Potter 12/24/2012, 2:53 PM  12/24/2012 Fredrich Birks PTA [phone] pager [phone] office

## 2012-12-24 NOTE — Evaluation (Signed)
Occupational Therapy Evaluation Patient Details Name: Sean Horn MRN: [id number] DOB: 27-Jun-1961 Today's Date: 12/24/2012 Time: 1478-2956 OT Time Calculation (min): 19 min  OT Assessment / Plan / Recommendation Clinical Impression   52 y.o. S/p Rt TKA. Pt practiced simulated shower transfer and LB dressing. Pt has 24/7 assist available at d/c and feel pt is safe to d/c home. OT to sign off.     OT Assessment  Patient does not need any further OT services    Follow Up Recommendations  No OT follow up;Supervision/Assistance - 24 hour    Barriers to Discharge      Equipment Recommendations  None recommended by OT    Recommendations for Other Services    Frequency       Precautions / Restrictions Precautions Precautions: Fall;Knee Required Braces or Orthoses: Knee Immobilizer - Right Knee Immobilizer - Right: Other (comment) (until discontinued by MD) Restrictions Weight Bearing Restrictions: Yes RLE Weight Bearing: Weight bearing as tolerated   Pertinent Vitals/Pain Pain 2/10 in knee. Repositioned.    ADL  Eating/Feeding: Independent Where Assessed - Eating/Feeding: Chair Grooming: Set up Where Assessed - Grooming: Unsupported sitting Upper Body Bathing: Set up Where Assessed - Upper Body Bathing: Unsupported sitting Lower Body Bathing: Min guard Where Assessed - Lower Body Bathing: Supported sit to stand Upper Body Dressing: Set up Where Assessed - Upper Body Dressing: Unsupported sitting Lower Body Dressing: Min guard Where Assessed - Lower Body Dressing: Supported sit to Pharmacist, hospital: Hydrographic surveyor Method: Sit to Barista: Raised toilet seat with arms (or 3-in-1 over toilet) Tub/Shower Transfer: Simulated;Minimal assistance Tub/Shower Transfer Method: Science writer: Other (comment);Walk in shower (3 in 1) Equipment Used: Gait belt;Knee Immobilizer;Rolling walker ADL Comments: Pt able to  reach feet while sitting in chair to don/doff sock. Pt at overall Minguard A for LB dressing with supported sit to stand transfer. Pt practiced simulated shower transfer and required very minimal assistance to maneuver walker. Pt and wife verbalized understanding and pt felt like he did not need to practice this again.    OT Diagnosis:    OT Problem List:   OT Treatment Interventions:     OT Goals    Visit Information  Last OT Received On: 12/24/12 Assistance Needed: +1    Subjective Data      Prior Functioning     Home Living Lives With: Spouse;Son Available Help at Discharge: Family;Available 24 hours/day Type of Home: House Home Access: Stairs to enter [employer] of Steps: 3 Entrance Stairs-Rails: Can reach both Home Layout: Two level;Able to live on main level with bedroom/bathroom Bathroom Shower/Tub: Walk-in shower;Door [employer] Toilet: Pharmacist, community: Yes How Accessible: Accessible via walker Home Adaptive Equipment: Bedside commode/3-in-1;Walker - rolling Prior Function Level of Independence: Independent Able to Take Stairs?: Yes Driving: Yes Vocation: Full time employment Communication Communication: No difficulties Dominant Hand: Right         Vision/Perception     Cognition  Cognition Arousal/Alertness: Awake/alert Behavior During Therapy: WFL for tasks assessed/performed Overall Cognitive Status: Within Functional Limits for tasks assessed    Extremity/Trunk Assessment Right Upper Extremity Assessment RUE ROM/Strength/Tone: [facility] for tasks assessed Left Upper Extremity Assessment LUE ROM/Strength/Tone: WFL for tasks assessed     Mobility Bed Mobility Bed Mobility: Supine to Sit;Sitting - Scoot to Edge of Bed Supine to Sit: 5: Supervision;HOB elevated Sitting - Scoot to Edge of Bed: 5: Supervision Details for Bed Mobility Assistance: Pt taking increased time. Supervision  for safety Transfers Transfers: Sit to  Stand;Stand to Sit Sit to Stand: 4: Min guard;With upper extremity assist;From bed;From chair/3-in-1 Stand to Sit: 4: Min guard;With upper extremity assist;To chair/3-in-1 Details for Transfer Assistance: vc's for hand placement     Exercise     Balance     End of Session OT - End of Session Equipment Utilized During Treatment: Gait belt;Right knee immobilizer Activity Tolerance: Patient tolerated treatment well Patient left: in chair;with call bell/phone within reach;with family/visitor present   GO     Earlie Raveling OTR/L [phone] 12/24/2012, 11:36 AM

## 2012-12-24 NOTE — Progress Notes (Signed)
Physical Therapy Treatment Patient Details Name: Horn Horn MRN: [id number] DOB: 11-Dec-1960 Today's Date: 12/24/2012 Time: 4098-1191 PT Time Calculation (min): 26 min  PT Assessment / Plan / Recommendation Comments on Treatment Session  Patient progressing well. Will attempt stair training this afternoon and anticipate DC tomorrow    Follow Up Recommendations  Home health PT;Supervision/Assistance - 24 hour     Does the patient have the potential to tolerate intense rehabilitation     Barriers to Discharge        Equipment Recommendations  None recommended by PT    Recommendations for Other Services    Frequency 7X/week   Plan Discharge plan remains appropriate;Frequency remains appropriate    Precautions / Restrictions Precautions Precautions: Knee Required Braces or Orthoses: Knee Immobilizer - Right Knee Immobilizer - Right: Other (comment) (until discontinued by MD) Restrictions Weight Bearing Restrictions: Yes RLE Weight Bearing: Weight bearing as tolerated   Pertinent Vitals/Pain 5/10 Right knee pain    Mobility  Bed Mobility Bed Mobility: Supine to Sit;Sitting - Scoot to Edge of Bed Supine to Sit: 6: Modified independent (Device/Increase time) Sitting - Scoot to Edge of Bed: 6: Modified independent (Device/Increase time) Details for Bed Mobility Assistance: Pt taking increased time. Supervision for safety Transfers Sit to Stand: 5: Supervision;From bed Stand to Sit: 5: Supervision;To chair/3-in-1 Details for Transfer Assistance: vc's for hand placement Ambulation/Gait Ambulation/Gait Assistance: 5: Supervision Ambulation Distance (Feet): 200 Feet Assistive device: Rolling walker Ambulation/Gait Assistance Details: Cues for sequency, RW management and posture Gait Pattern: Step-to pattern    Exercises Total Joint Exercises Quad Sets: AROM;Right;10 reps Heel Slides: AROM;Right;10 reps Hip ABduction/ADduction: AROM;Right;10 reps Straight Leg Raises:  AAROM;Right;10 reps Long Arc Quad: AROM;Right;10 reps Goniometric ROM: AROM 90 flexion   PT Diagnosis:    PT Problem List:   PT Treatment Interventions:     PT Goals Acute Rehab PT Goals PT Goal: Supine/Side to Sit - Progress: Met PT Goal: Sit to Stand - Progress: Progressing toward goal PT Goal: Stand to Sit - Progress: Progressing toward goal PT Goal: Ambulate - Progress: Progressing toward goal  Visit Information  Last PT Received On: 12/24/12 Assistance Needed: +1    Subjective Data      Cognition  Cognition Arousal/Alertness: Awake/alert Behavior During Therapy: WFL for tasks assessed/performed Overall Cognitive Status: Within Functional Limits for tasks assessed    Balance     End of Session PT - End of Session Equipment Utilized During Treatment: Gait belt;Right knee immobilizer Activity Tolerance: Patient tolerated treatment well Patient left: in chair;with call bell/phone within reach;with family/visitor present Nurse Communication: Mobility status CPM Right Knee CPM Right Knee: On Right Knee Flexion (Degrees): 70 Right Knee Extension (Degrees): 0   GP     Horn Horn Potter 12/24/2012, 11:55 AM 12/24/2012 Fredrich Birks PTA [phone] pager [phone] office

## 2012-12-24 NOTE — Progress Notes (Signed)
PT is recommending home with HH and not SNF. CSW will make CM aware. Clinical Social Worker will sign off for now as social work intervention is no longer needed. Please consult us again if new need arises.   Terrilee Dudzik, MSW [phone] 

## 2012-12-25 ENCOUNTER — Encounter (HOSPITAL_COMMUNITY): Payer: Self-pay | Admitting: General Practice

## 2012-12-25 LAB — CBC
HCT: 33.8 % — ABNORMAL LOW (ref 39.0–52.0)
Hemoglobin: 12.1 g/dL — ABNORMAL LOW (ref 13.0–17.0)
MCV: 88.3 fL (ref 78.0–100.0)
RDW: 13.2 % (ref 11.5–15.5)
WBC: 9.8 10*3/uL (ref 4.0–10.5)

## 2012-12-25 LAB — GLUCOSE, CAPILLARY
Glucose-Capillary: 212 mg/dL — ABNORMAL HIGH (ref 70–99)
Glucose-Capillary: 173 mg/dL — ABNORMAL HIGH (ref 70–99)

## 2012-12-25 NOTE — Discharge Summary (Signed)
Patient ID: MEKAI WILKINSON MRN: [id number] DOB/AGE: 52-Mar-1962 17 y.o.  Admit date: 12/23/2012 Discharge date: 12/25/2012  Admission Diagnoses:  Principal Problem:   Right knee DJD   Discharge Diagnoses:  Same  Past Medical History  Diagnosis Date  . Hypertension   . PONV (postoperative nausea and vomiting)   . Anxiety   . GERD (gastroesophageal reflux disease)   . Kidney stones     Hx:of  . Type II diabetes mellitus   . Arthritis     "right knee; hopefully gone now" (12/25/2012)    Surgeries: Procedure(s): TOTAL KNEE ARTHROPLASTY on 12/23/2012   Consultants:    Discharged Condition: Improved  Hospital Course: KARIN GRIFFITH is an 52 y.o. male who was admitted 12/23/2012 for operative treatment ofRight knee DJD. Patient has severe unremitting pain that affects sleep, daily activities, and work/hobbies. After pre-op clearance the patient was taken to the operating room on 12/23/2012 and underwent  Procedure(s): TOTAL KNEE ARTHROPLASTY.    Patient was given perioperative antibiotics: Anti-infectives   Start     Dose/Rate Route Frequency Ordered Stop   12/23/12 1400  ceFAZolin (ANCEF) IVPB 2 g/50 mL premix     2 g 100 mL/hr over 30 Minutes Intravenous 4 times per day 12/23/12 1335 12/23/12 1838   12/23/12 0600  ceFAZolin (ANCEF) IVPB 2 g/50 mL premix     2 g 100 mL/hr over 30 Minutes Intravenous On call to O.R. 12/22/12 1401 12/23/12 0748       Patient was given sequential compression devices, early ambulation, and chemoprophylaxis to prevent DVT.  Patient benefited maximally from hospital stay and there were no complications.    Recent vital signs: Patient Vitals for the past 24 hrs:  BP Temp Temp src Pulse Resp SpO2  12/25/12 1229 145/79 mmHg 98 F (36.7 C) - 100 20 97 %  12/25/12 0544 137/80 mmHg 98.3 F (36.8 C) Oral 93 18 96 %  12/25/12 0400 - - - - 18 96 %  12/25/12 0000 - - - - 16 96 %  12/24/12 2139 128/72 mmHg 98.2 F (36.8 C) Oral 84 16 96 %   12/24/12 2000 - - - - 18 96 %     Recent laboratory studies:  Recent Labs  12/24/12 0615 12/25/12 0425  WBC 13.6* 9.8  HGB 12.0* 12.1*  HCT 32.3* 33.8*  PLT 249 248  NA 132*  --   K 4.1  --   CL 96  --   CO2 25  --   BUN 14  --   CREATININE 0.73  --   GLUCOSE 157*  --   CALCIUM 8.8  --      Discharge Medications:     Medication List    STOP taking these medications       HYDROcodone-acetaminophen 5-325 MG per tablet  Commonly known as:  NORCO/VICODIN     VIMOVO 500-20 MG Tbec  Generic drug:  Naproxen-Esomeprazole      TAKE these medications       ALPRAZolam 0.5 MG tablet  Commonly known as:  XANAX  Take 0.5 mg by mouth daily.     aspirin 325 MG EC tablet  Take 1 tablet (325 mg total) by mouth 2 (two) times daily.     cetirizine 10 MG tablet  Commonly known as:  ZYRTEC  Take 10 mg by mouth daily. During allergy season     glipiZIDE 10 MG tablet  Commonly known as:  GLUCOTROL  Take 10 mg by  mouth 2 (two) times daily before a meal.     HYDROcodone-acetaminophen 7.5-325 MG per tablet  Commonly known as:  NORCO  Take 1-2 tablets by mouth every 4 (four) hours as needed.     linagliptin 5 MG Tabs tablet  Commonly known as:  TRADJENTA  Take 5 mg by mouth daily.     lisinopril-hydrochlorothiazide 20-12.5 MG per tablet  Commonly known as:  PRINZIDE,ZESTORETIC  Take 1 tablet by mouth daily.     methocarbamol 500 MG tablet  Commonly known as:  ROBAXIN  Take 1 tablet (500 mg total) by mouth every 6 (six) hours as needed.     pioglitazone-metformin 15-850 MG per tablet  Commonly known as:  ACTOPLUS MET  Take 1 tablet by mouth 2 (two) times daily with a meal.        Diagnostic Studies: Dg Chest 2 View  12/16/2012   *RADIOLOGY REPORT*  Clinical Data: Preoperative evaluation for total knee surgery  CHEST - 2 VIEW  Comparison: 12/30/2007  Findings: The heart and pulmonary vascularity are within normal limits.  Some minimal right basilar atelectasis is  noted.  This could be related to scarring.  No focal infiltrate or sizable effusion is noted.  There is a tiny density identified between the anterior aspects of the second and third ribs on the right anteriorly consistent with some of the small nodules seen on previous CT examination.  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  Tiny nodules which appear stable from previous exams.  They are likely benign in etiology.   Original Report Authenticated By: Alcide Clever, M.D.    Disposition: 01-Home or Self Care      Discharge Orders   Future Orders Complete By Expires     Call MD / Call 911  As directed     Comments:      If you experience chest pain or shortness of breath, CALL 911 and be transported to the hospital emergency room.  If you develope a fever above 101 F, pus (white drainage) or increased drainage or redness at the wound, or calf pain, call your surgeon's office.    Constipation Prevention  As directed     Comments:      Drink plenty of fluids.  Prune juice may be helpful.  You may use a stool softener, such as Colace (over the counter) 100 mg twice a day.  Use MiraLax (over the counter) for constipation as needed.    Diet - low sodium heart healthy  As directed     Increase activity slowly as tolerated  As directed        Follow-up Information   Follow up with DALLDORF,PETER G, MD. Call in 2 weeks.   Contact information:   [address] [place] [address] [phone]        Signed: Prince Rome 12/25/2012, 2:27 PM

## 2012-12-25 NOTE — Progress Notes (Signed)
Physical Therapy Treatment Patient Details Name: Sean Horn MRN: [id number] DOB: Nov 29, 1960 Today's Date: 12/25/2012 Time: 4098-1191 PT Time Calculation (min): 17 min  PT Assessment / Plan / Recommendation Comments on Treatment Session  COmpleted stair training this PM. No buckling noted.     Follow Up Recommendations  Home health PT;Supervision/Assistance - 24 hour     Does the patient have the potential to tolerate intense rehabilitation     Barriers to Discharge        Equipment Recommendations  None recommended by PT    Recommendations for Other Services    Frequency 7X/week   Plan Discharge plan remains appropriate;Frequency remains appropriate    Precautions / Restrictions Precautions Precautions: Knee Required Braces or Orthoses: Knee Immobilizer - Right Restrictions RLE Weight Bearing: Weight bearing as tolerated   Pertinent Vitals/Pain     Mobility  Bed Mobility Supine to Sit: 6: Modified independent (Device/Increase time) Sitting - Scoot to Edge of Bed: 6: Modified independent (Device/Increase time) Transfers Sit to Stand: 6: Modified independent (Device/Increase time) Stand to Sit: 6: Modified independent (Device/Increase time) Ambulation/Gait Ambulation/Gait Assistance: 6: Modified independent (Device/Increase time) Ambulation Distance (Feet): 200 Feet Assistive device: Rolling walker Ambulation/Gait Assistance Details: cues for heel strike. not able to tolerate as much weight on R leg today due to soreness Gait Pattern: Step-to pattern Stairs: Yes Stairs Assistance: 4: Min guard Stairs Assistance Details (indicate cue type and reason): Cues for technique Stair Management Technique: One rail Right;Sideways;Step to pattern Number of Stairs: 2    Exercises Total Joint Exercises Quad Sets: AROM;Right;10 reps Heel Slides: AROM;Right;10 reps Hip ABduction/ADduction: AROM;Right;10 reps Straight Leg Raises: AAROM;Right;10 reps Long Arc Quad:  AROM;Right;10 reps   PT Diagnosis:    PT Problem List:   PT Treatment Interventions:     PT Goals Acute Rehab PT Goals PT Goal: Supine/Side to Sit - Progress: Met PT Goal: Sit to Stand - Progress: Met PT Goal: Stand to Sit - Progress: Met PT Goal: Ambulate - Progress: Progressing toward goal PT Goal: Up/Down Stairs - Progress: Progressing toward goal  Visit Information  Last PT Received On: 12/25/12 Assistance Needed: +1    Subjective Data      Cognition  Cognition Arousal/Alertness: Awake/alert Behavior During Therapy: WFL for tasks assessed/performed Overall Cognitive Status: Within Functional Limits for tasks assessed    Balance     End of Session PT - End of Session Equipment Utilized During Treatment: Gait belt Activity Tolerance: Patient tolerated treatment well Patient left: in chair;with call bell/phone within reach Nurse Communication: Mobility status   GP     Fredrich Birks 12/25/2012, 2:59 PM  12/25/2012 Fredrich Birks PTA [phone] pager [phone] office

## 2012-12-25 NOTE — Progress Notes (Signed)
Patient discharged in stable condition via wheelchair to home. Discharge instructions and prescriptions were given and explained. 

## 2012-12-25 NOTE — Progress Notes (Signed)
Physical Therapy Treatment Patient Details Name: Sean Horn MRN: [id number] DOB: [birth year] Today's Date: 12/25/2012 Time: 7829-5621 PT Time Calculation (min): 17 min  PT Assessment / Plan / Recommendation Comments on Treatment Session  Patient with increased soreness this morning but still making good progress and highly motivated. Will attempt stairs again this afternoon    Follow Up Recommendations  Home health PT;Supervision/Assistance - 24 hour     Does the patient have the potential to tolerate intense rehabilitation     Barriers to Discharge        Equipment Recommendations  None recommended by PT    Recommendations for Other Services    Frequency 7X/week   Plan Discharge plan remains appropriate;Frequency remains appropriate    Precautions / Restrictions Precautions Precautions: Knee Required Braces or Orthoses: Knee Immobilizer - Right Restrictions RLE Weight Bearing: Weight bearing as tolerated   Pertinent Vitals/Pain 10/10 R knee pain. PRemedicated    Mobility  Bed Mobility Supine to Sit: 6: Modified independent (Device/Increase time) Sitting - Scoot to Edge of Bed: 6: Modified independent (Device/Increase time) Transfers Sit to Stand: 6: Modified independent (Device/Increase time) Stand to Sit: 6: Modified independent (Device/Increase time) Ambulation/Gait Ambulation/Gait Assistance: 5: Supervision Ambulation Distance (Feet): 150 Feet Assistive device: Rolling walker Ambulation/Gait Assistance Details: cues for heel strike. not able to tolerate as much weight on R leg today due to soreness Gait Pattern: Step-to pattern Stairs: No    Exercises Total Joint Exercises Quad Sets: AROM;Right;10 reps Heel Slides: AROM;Right;10 reps Hip ABduction/ADduction: AROM;Right;10 reps Straight Leg Raises: AAROM;Right;10 reps Long Arc Quad: AROM;Right;10 reps   PT Diagnosis:    PT Problem List:   PT Treatment Interventions:     PT Goals Acute Rehab PT  Goals PT Goal: Supine/Side to Sit - Progress: Met PT Goal: Sit to Stand - Progress: Met PT Goal: Stand to Sit - Progress: Met PT Goal: Ambulate - Progress: Progressing toward goal  Visit Information  Last PT Received On: 12/25/12 Assistance Needed: +1    Subjective Data      Cognition  Cognition Arousal/Alertness: Awake/alert Behavior During Therapy: WFL for tasks assessed/performed Overall Cognitive Status: Within Functional Limits for tasks assessed    Balance     End of Session PT - End of Session Equipment Utilized During Treatment: Gait belt Activity Tolerance: Patient tolerated treatment well Patient left: in chair;with call bell/phone within reach Nurse Communication: Mobility status   GP     Fredrich Birks 12/25/2012, 11:37 AM 12/25/2012 Fredrich Birks PTA [phone] pager [phone] office

## 2012-12-25 NOTE — Progress Notes (Signed)
Subjective: 2 Days Post-Op Procedure(s) (LRB): TOTAL KNEE ARTHROPLASTY (Right)  Activity level:  wbat Diet tolerance:  ok Voiding:  ok Patient reports pain as 1 on 0-10 scale.    Objective: Vital signs in last 24 hours: Temp:  [98 F (36.7 C)-98.3 F (36.8 C)] 98 F (36.7 C) (05/22 1229) Pulse Rate:  [84-100] 100 (05/22 1229) Resp:  [16-20] 20 (05/22 1229) BP: (128-145)/(72-80) 145/79 mmHg (05/22 1229) SpO2:  [96 %-97 %] 97 % (05/22 1229)  Labs:  Recent Labs  12/24/12 0615 12/25/12 0425  HGB 12.0* 12.1*    Recent Labs  12/24/12 0615 12/25/12 0425  WBC 13.6* 9.8  RBC 3.75* 3.83*  HCT 32.3* 33.8*  PLT 249 248    Recent Labs  12/24/12 0615  NA 132*  K 4.1  CL 96  CO2 25  BUN 14  CREATININE 0.73  GLUCOSE 157*  CALCIUM 8.8   No results found for this basename: LABPT, INR,  in the last 72 hours  Physical Exam:  Neurologically intact ABD soft Neurovascular intact Sensation intact distally Intact pulses distally Dorsiflexion/Plantar flexion intact No cellulitis present Compartment soft  Assessment/Plan:  2 Days Post-Op Procedure(s) (LRB): TOTAL KNEE ARTHROPLASTY (Right) Advance diet Up with therapy D/C IV fluids Discharge home with home health    Sean Horn R 12/25/2012, 2:25 PM

## 2013-01-13 ENCOUNTER — Other Ambulatory Visit (HOSPITAL_COMMUNITY): Payer: Managed Care, Other (non HMO)

## 2013-06-09 ENCOUNTER — Other Ambulatory Visit: Payer: Self-pay | Admitting: Orthopaedic Surgery

## 2013-06-24 NOTE — H&P (Signed)
Sean Horn is an 52 y.o. male.   Chief Complaint: Right knee pain HPI: [name] had a right TKR done in May of 2014.  Since that time he has been having pain in the right knee.  We have tried 1 Marcaine injection, which did relieve his pain but once the Marcaine wore off his pain came back.  She does feel a bit of a catch with range of motion, particularly toward full extension.  X-rays of his knee shows status post right TKR without any adverse features.  We have discussed proceeding with a knee arthroscopy to excise a fibrous band that may be causing his problems.  Past Medical History  Diagnosis Date  . Hypertension   . PONV (postoperative nausea and vomiting)   . Anxiety   . GERD (gastroesophageal reflux disease)   . Kidney stones     Hx:of  . Type II diabetes mellitus   . Arthritis     "right knee; hopefully gone now" (12/25/2012)    Past Surgical History  Procedure Laterality Date  . Wrist surgery Right ~ 1997    "total of 2 OR's; torn ligaments; put pins in; pins got infected; took pins out; put plate in" (11/12/8117)  . Shoulder arthroscopy w/ rotator cuff repair Left ~ 2007  . Knee arthroscopy Left 1980's  . Inguinal hernia repair Left 1990's  . Cholecystectomy  1980's  . Total knee arthroplasty Right 12/23/2012    Procedure: TOTAL KNEE ARTHROPLASTY;  Surgeon: Velna Ochs, MD;  Location: [facility] OR;  Service: Orthopedics;  Laterality: Right;  DEPUY, RNFA  . Shoulder open rotator cuff repair Left ~ 1997    "after torn in PT on 2nd day" (12/25/2012)  . Shoulder adhesion release Left ` 1998  . Knee arthroscopy Right 2013  . Lithotripsy      "8 times" (12/25/2012)  . Cystoscopy/retrograde/ureteroscopy/stone extraction with basket      "once" (12/25/2012)    Family History  Problem Relation Age of Onset  . COPD Mother   . Diabetes Father    Social History:  reports that he has never smoked. His smokeless tobacco use includes Chew. He reports that he drinks alcohol. He  reports that he does not use illicit drugs.  Allergies:  Allergies  Allergen Reactions  . Toradol [Ketorolac Tromethamine] Hives    No prescriptions prior to admission    No results found for this or any previous visit (from the past 48 hour(s)). No results found.  Review of Systems  Constitutional: Negative.   HENT: Negative.   Eyes: Negative.   Respiratory: Negative.   Cardiovascular: Negative.   Gastrointestinal: Negative.   Genitourinary: Negative.   Musculoskeletal: Positive for joint pain.  Skin: Negative.   Neurological: Negative.   Endo/Heme/Allergies: Negative.   Psychiatric/Behavioral: Negative.     There were no vitals taken for this visit. Physical Exam  Constitutional: He appears well-nourished.  HENT:  Head: Atraumatic.  Eyes: EOM are normal.  Neck: Neck supple.  Cardiovascular: Normal rate.   Respiratory: Effort normal.  GI: Soft.  Musculoskeletal:  Right knee exam: Well-healed incision.  No effusion.  Motion 0-1 15.   Pain with range of motion.  Calf soft and nontender.  Neurological: He is alert.  Skin: Skin is warm.     Assessment/Plan Assessment: Status post right TKR on 12/23/12 with continued pain and possible fibrous band. Plan: Connar is having continued knee pain and we have discussed proceeding with a knee arthroscopy on the right side  to look for a potential fibrous band that may be causing his problems.  Cultures have been done previously that have been negative..  The Marcaine injection gave him some relief for a number of hours.  We have discussed the risks of anesthesia, infection and DVT associated with arthroscopy.  Samaad Hashem R 06/24/2013, 6:05 PM

## 2013-06-25 ENCOUNTER — Encounter (HOSPITAL_BASED_OUTPATIENT_CLINIC_OR_DEPARTMENT_OTHER): Payer: Self-pay | Admitting: *Deleted

## 2013-06-26 ENCOUNTER — Encounter (HOSPITAL_COMMUNITY)
Admission: RE | Admit: 2013-06-26 | Discharge: 2013-06-26 | Disposition: A | Discharge status: Home / Self Care | Payer: Managed Care, Other (non HMO) | Source: Ambulatory Visit | Attending: Orthopaedic Surgery | Admitting: Orthopaedic Surgery

## 2013-06-26 DIAGNOSIS — Z01812 Encounter for preprocedural laboratory examination: Secondary | ICD-10-CM | POA: Insufficient documentation

## 2013-06-26 DIAGNOSIS — Z01818 Encounter for other preprocedural examination: Secondary | ICD-10-CM | POA: Insufficient documentation

## 2013-06-26 LAB — BASIC METABOLIC PANEL
BUN: 12 mg/dL (ref 6–23)
Chloride: 92 mEq/L — ABNORMAL LOW (ref 96–112)
GFR calc Af Amer: >90 mL/min (ref >90)
GFR calc non Af Amer: 82 mL/min — ABNORMAL LOW (ref >90)
Glucose, Bld: 272 mg/dL — ABNORMAL HIGH (ref 70–99)
Potassium: 4.3 mEq/L (ref 3.5–5.1)
Sodium: 134 mEq/L — ABNORMAL LOW (ref 135–145)

## 2013-06-30 ENCOUNTER — Encounter (HOSPITAL_BASED_OUTPATIENT_CLINIC_OR_DEPARTMENT_OTHER)
Admission: RE | Disposition: A | Discharge status: Home / Self Care | Payer: Self-pay | Source: Ambulatory Visit | Attending: Orthopaedic Surgery

## 2013-06-30 ENCOUNTER — Encounter (HOSPITAL_BASED_OUTPATIENT_CLINIC_OR_DEPARTMENT_OTHER): Payer: Self-pay

## 2013-06-30 ENCOUNTER — Ambulatory Visit (HOSPITAL_BASED_OUTPATIENT_CLINIC_OR_DEPARTMENT_OTHER): Payer: Managed Care, Other (non HMO) | Admitting: Anesthesiology

## 2013-06-30 ENCOUNTER — Encounter (HOSPITAL_BASED_OUTPATIENT_CLINIC_OR_DEPARTMENT_OTHER): Payer: Managed Care, Other (non HMO) | Admitting: Anesthesiology

## 2013-06-30 ENCOUNTER — Ambulatory Visit (HOSPITAL_BASED_OUTPATIENT_CLINIC_OR_DEPARTMENT_OTHER)
Admission: RE | Admit: 2013-06-30 | Discharge: 2013-06-30 | Disposition: A | Discharge status: Home / Self Care | Payer: Managed Care, Other (non HMO) | Source: Ambulatory Visit | Attending: Orthopaedic Surgery | Admitting: Orthopaedic Surgery

## 2013-06-30 DIAGNOSIS — Z96659 Presence of unspecified artificial knee joint: Secondary | ICD-10-CM | POA: Insufficient documentation

## 2013-06-30 DIAGNOSIS — M6750 Plica syndrome, unspecified knee: Secondary | ICD-10-CM | POA: Insufficient documentation

## 2013-06-30 DIAGNOSIS — K219 Gastro-esophageal reflux disease without esophagitis: Secondary | ICD-10-CM | POA: Insufficient documentation

## 2013-06-30 DIAGNOSIS — F411 Generalized anxiety disorder: Secondary | ICD-10-CM | POA: Insufficient documentation

## 2013-06-30 DIAGNOSIS — Z9889 Other specified postprocedural states: Secondary | ICD-10-CM

## 2013-06-30 DIAGNOSIS — I10 Essential (primary) hypertension: Secondary | ICD-10-CM | POA: Insufficient documentation

## 2013-06-30 DIAGNOSIS — E119 Type 2 diabetes mellitus without complications: Secondary | ICD-10-CM | POA: Insufficient documentation

## 2013-06-30 HISTORY — DX: Unspecified osteoarthritis, unspecified site: M19.90

## 2013-06-30 HISTORY — PX: KNEE ARTHROSCOPY: SHX127

## 2013-06-30 HISTORY — DX: Personal history of urinary calculi: Z87.442

## 2013-06-30 HISTORY — DX: Type 2 diabetes mellitus without complications: E11.9

## 2013-06-30 LAB — GLUCOSE, CAPILLARY
Glucose-Capillary: 217 mg/dL — ABNORMAL HIGH (ref 70–99)
Glucose-Capillary: 166 mg/dL — ABNORMAL HIGH (ref 70–99)

## 2013-06-30 SURGERY — ARTHROSCOPY, KNEE
Anesthesia: General | Site: Knee | Laterality: Right | Wound class: Clean

## 2013-06-30 MED ORDER — METOCLOPRAMIDE HCL 5 MG/ML IJ SOLN: 10.0000 mg | Freq: Once | INTRAMUSCULAR | Status: DC | PRN

## 2013-06-30 MED ORDER — PHENYLEPHRINE HCL 10 MG/ML IJ SOLN
INTRAMUSCULAR | Status: DC | PRN
  Administered 2013-06-30 (×2): 40 ug via INTRAVENOUS

## 2013-06-30 MED ORDER — FENTANYL CITRATE 0.05 MG/ML IJ SOLN
INTRAMUSCULAR | Status: DC | PRN
  Administered 2013-06-30: 100 ug via INTRAVENOUS

## 2013-06-30 MED ORDER — SODIUM CHLORIDE 0.9 % IR SOLN
Status: DC | PRN
  Administered 2013-06-30: 3000 mL

## 2013-06-30 MED ORDER — DEXAMETHASONE SODIUM PHOSPHATE 10 MG/ML IJ SOLN
INTRAMUSCULAR | Status: DC | PRN
  Administered 2013-06-30: 10 mg via INTRAVENOUS

## 2013-06-30 MED ORDER — FENTANYL CITRATE 0.05 MG/ML IJ SOLN
INTRAMUSCULAR | Status: AC
  Filled 2013-06-30: qty 6

## 2013-06-30 MED ORDER — CEFAZOLIN SODIUM 1-5 GM-% IV SOLN
INTRAVENOUS | Status: AC
  Filled 2013-06-30: qty 50

## 2013-06-30 MED ORDER — CEFAZOLIN SODIUM 1-5 GM-% IV SOLN
1.0000 g | Freq: Once | INTRAVENOUS | Status: AC
  Administered 2013-06-30: 1 g via INTRAVENOUS

## 2013-06-30 MED ORDER — CEFAZOLIN SODIUM-DEXTROSE 2-3 GM-% IV SOLR
INTRAVENOUS | Status: AC
  Filled 2013-06-30: qty 50

## 2013-06-30 MED ORDER — OXYCODONE HCL 5 MG PO TABS
ORAL_TABLET | ORAL | Status: AC
  Filled 2013-06-30: qty 1

## 2013-06-30 MED ORDER — OXYCODONE HCL 5 MG PO TABS
5.0000 mg | ORAL_TABLET | Freq: Once | ORAL | Status: AC | PRN
  Administered 2013-06-30: 5 mg via ORAL

## 2013-06-30 MED ORDER — MIDAZOLAM HCL 5 MG/5ML IJ SOLN
INTRAMUSCULAR | Status: DC | PRN
  Administered 2013-06-30: 2 mg via INTRAVENOUS

## 2013-06-30 MED ORDER — HYDROMORPHONE HCL PF 1 MG/ML IJ SOLN: 0.2500 mg | INTRAMUSCULAR | Status: DC | PRN

## 2013-06-30 MED ORDER — MIDAZOLAM HCL 2 MG/2ML IJ SOLN: 1.0000 mg | INTRAMUSCULAR | Status: DC | PRN

## 2013-06-30 MED ORDER — LACTATED RINGERS IV SOLN: INTRAVENOUS | Status: DC

## 2013-06-30 MED ORDER — LACTATED RINGERS IV SOLN
INTRAVENOUS | Status: DC
  Administered 2013-06-30 (×2): via INTRAVENOUS

## 2013-06-30 MED ORDER — BUPIVACAINE-EPINEPHRINE 0.5% -1:200000 IJ SOLN
INTRAMUSCULAR | Status: DC | PRN
  Administered 2013-06-30: 10 mL

## 2013-06-30 MED ORDER — MIDAZOLAM HCL 2 MG/2ML IJ SOLN
INTRAMUSCULAR | Status: AC
  Filled 2013-06-30: qty 2

## 2013-06-30 MED ORDER — FENTANYL CITRATE 0.05 MG/ML IJ SOLN: 50.0000 ug | INTRAMUSCULAR | Status: DC | PRN

## 2013-06-30 MED ORDER — CEFAZOLIN SODIUM-DEXTROSE 2-3 GM-% IV SOLR
2.0000 g | INTRAVENOUS | Status: AC
  Administered 2013-06-30: 2 g via INTRAVENOUS

## 2013-06-30 MED ORDER — PROPOFOL 10 MG/ML IV BOLUS
INTRAVENOUS | Status: DC | PRN
  Administered 2013-06-30: 250 mg via INTRAVENOUS

## 2013-06-30 MED ORDER — BUPIVACAINE-EPINEPHRINE PF 0.5-1:200000 % IJ SOLN
INTRAMUSCULAR | Status: AC
  Filled 2013-06-30: qty 30

## 2013-06-30 MED ORDER — CHLORHEXIDINE GLUCONATE 4 % EX LIQD: 60.0000 mL | Freq: Once | CUTANEOUS | Status: DC

## 2013-06-30 MED ORDER — MIDAZOLAM HCL 2 MG/ML PO SYRP: 12.0000 mg | ORAL_SOLUTION | Freq: Once | ORAL | Status: DC | PRN

## 2013-06-30 MED ORDER — OXYCODONE HCL 5 MG/5ML PO SOLN: 5.0000 mg | Freq: Once | ORAL | Status: AC | PRN

## 2013-06-30 MED ORDER — PHENYLEPHRINE HCL 10 MG/ML IJ SOLN
10.0000 mg | INTRAVENOUS | Status: DC | PRN
  Administered 2013-06-30: 50 ug/min via INTRAVENOUS

## 2013-06-30 SURGICAL SUPPLY — 41 items
BANDAGE ELASTIC 6 VELCRO ST LF (GAUZE/BANDAGES/DRESSINGS) ×2 IMPLANT
BANDAGE GAUZE ELAST BULKY 4 IN (GAUZE/BANDAGES/DRESSINGS) ×2 IMPLANT
BLADE CUDA 5.5 (BLADE) IMPLANT
BLADE GREAT WHITE 4.2 (BLADE) ×2 IMPLANT
CANISTER SUCT 3000ML (MISCELLANEOUS) IMPLANT
CANISTER SUCT LVC 12 LTR MEDI- (MISCELLANEOUS) ×1 IMPLANT
DRAPE ARTHROSCOPY W/POUCH 114 (DRAPES) ×2 IMPLANT
DRAPE U-SHAPE 47X51 STRL (DRAPES) ×2 IMPLANT
DRSG EMULSION OIL 3X3 NADH (GAUZE/BANDAGES/DRESSINGS) ×2 IMPLANT
DURAPREP 26ML APPLICATOR (WOUND CARE) ×2 IMPLANT
ELECT MENISCUS 165MM 90D (ELECTRODE) IMPLANT
ELECT REM PT RETURN 9FT ADLT (ELECTROSURGICAL)
ELECTRODE REM PT RTRN 9FT ADLT (ELECTROSURGICAL) IMPLANT
GLOVE BIO SURGEON STRL SZ8 (GLOVE) ×1 IMPLANT
GLOVE BIO SURGEON STRL SZ8.5 (GLOVE) ×1 IMPLANT
GLOVE BIOGEL PI IND STRL 8 (GLOVE) ×1 IMPLANT
GLOVE BIOGEL PI IND STRL 8.5 (GLOVE) ×1 IMPLANT
GLOVE BIOGEL PI INDICATOR 8 (GLOVE) ×2
GLOVE BIOGEL PI INDICATOR 8.5 (GLOVE)
GLOVE ECLIPSE 6.5 STRL STRAW (GLOVE) ×1 IMPLANT
GLOVE SS BIOGEL STRL SZ 8 (GLOVE) ×1 IMPLANT
GLOVE SUPERSENSE BIOGEL SZ 8 (GLOVE) ×1
GOWN BRE IMP PREV XXLGXLNG (GOWN DISPOSABLE) ×2 IMPLANT
GOWN PREVENTION PLUS XLARGE (GOWN DISPOSABLE) ×2 IMPLANT
GOWN PREVENTION PLUS XXLARGE (GOWN DISPOSABLE) ×1 IMPLANT
IV NS IRRIG 3000ML ARTHROMATIC (IV SOLUTION) ×3 IMPLANT
KNEE WRAP E Z 3 GEL PACK (MISCELLANEOUS) ×2 IMPLANT
PACK ARTHROSCOPY DSU (CUSTOM PROCEDURE TRAY) ×2 IMPLANT
PACK BASIN DAY SURGERY FS (CUSTOM PROCEDURE TRAY) ×2 IMPLANT
PENCIL BUTTON HOLSTER BLD 10FT (ELECTRODE) IMPLANT
SET ARTHROSCOPY TUBING (MISCELLANEOUS) ×2
SET ARTHROSCOPY TUBING LN (MISCELLANEOUS) ×1 IMPLANT
SHEET MEDIUM DRAPE 40X70 STRL (DRAPES) ×2 IMPLANT
SPONGE GAUZE 4X4 12PLY (GAUZE/BANDAGES/DRESSINGS) ×2 IMPLANT
SUT ETHILON 3 0 PS 1 (SUTURE) ×1 IMPLANT
SYR 3ML 18GX1 1/2 (SYRINGE) IMPLANT
TOWEL OR 17X24 6PK STRL BLUE (TOWEL DISPOSABLE) ×2 IMPLANT
TOWEL OR NON WOVEN STRL DISP B (DISPOSABLE) ×1 IMPLANT
WAND 30 DEG SABER W/CORD (SURGICAL WAND) IMPLANT
WAND STAR VAC 90 (SURGICAL WAND) IMPLANT
WATER STERILE IRR 1000ML POUR (IV SOLUTION) ×2 IMPLANT

## 2013-06-30 NOTE — Op Note (Signed)
[id number] 

## 2013-06-30 NOTE — Anesthesia Procedure Notes (Signed)
Procedure Name: LMA Insertion Date/Time: 06/30/2013 12:06 PM Performed by: Caren Macadam Pre-anesthesia Checklist: Patient identified, Emergency Drugs available, Suction available and Patient being monitored Patient Re-evaluated:Patient Re-evaluated prior to inductionOxygen Delivery Method: Circle System Utilized Preoxygenation: Pre-oxygenation with 100% oxygen Intubation Type: IV induction Ventilation: Mask ventilation without difficulty LMA: LMA inserted LMA Size: 4.0 Number of attempts: 1 Airway Equipment and Method: bite block Placement Confirmation: positive ETCO2 and breath sounds checked- equal and bilateral Tube secured with: Tape Dental Injury: Teeth and Oropharynx as per pre-operative assessment

## 2013-06-30 NOTE — Anesthesia Postprocedure Evaluation (Signed)
Anesthesia Post Note  Patient: Sean Horn  Procedure(s) Performed: Procedure(s) (LRB): RIGHT KNEE ARTHROSCOPY AND SYNOVECTOMY (Right)  Anesthesia type: General  Patient location: PACU  Post pain: Pain level controlled  Post assessment: Patient's Cardiovascular Status Stable  Last Vitals:  Filed Vitals:   06/30/13 1345  BP: 116/73  Pulse: 95  Temp: 36.7 C  Resp: 16    Post vital signs: Reviewed and stable  Level of consciousness: alert  Complications: No apparent anesthesia complications

## 2013-06-30 NOTE — Transfer of Care (Signed)
Immediate Anesthesia Transfer of Care Note  Patient: Sean Horn  Procedure(s) Performed: Procedure(s): RIGHT KNEE ARTHROSCOPY AND SYNOVECTOMY (Right)  Patient Location: PACU  Anesthesia Type:General  Level of Consciousness: awake and alert   Airway & Oxygen Therapy: Patient Spontanous Breathing and Patient connected to face mask oxygen  Post-op Assessment: Report given to PACU RN and Post -op Vital signs reviewed and stable  Post vital signs: Reviewed and stable  Complications: No apparent anesthesia complications

## 2013-06-30 NOTE — Interval H&P Note (Signed)
History and Physical Interval Note:  06/30/2013 11:51 AM  Sean Horn  has presented today for surgery, with the diagnosis of RIGHT PAINFUL TOTAL Horn ARTHROPLASTY  The various methods of treatment have been discussed with the patient and family. After consideration of risks, benefits and other options for treatment, the patient has consented to  Procedure(s): RIGHT ARTHROSCOPY Horn (Right) as a surgical intervention .  The patient's history has been reviewed, patient examined, no change in status, stable for surgery.  I have reviewed the patient's chart and labs.  Questions were answered to the patient's satisfaction.     Davaughn Hillyard G

## 2013-06-30 NOTE — Anesthesia Preprocedure Evaluation (Signed)
Anesthesia Evaluation  Patient identified by MRN, date of birth, ID band Patient awake    Reviewed: Allergy & Precautions, H&P , NPO status , Patient's Chart, lab work & pertinent test results, reviewed documented beta blocker date and time   Airway Mallampati: II TM Distance: >3 FB Neck ROM: full    Dental   Pulmonary neg pulmonary ROS,  breath sounds clear to auscultation        Cardiovascular hypertension, On Medications Rhythm:regular     Neuro/Psych negative neurological ROS  negative psych ROS   GI/Hepatic negative GI ROS, Neg liver ROS,   Endo/Other  diabetes, Oral Hypoglycemic Agents  Renal/GU negative Renal ROS  negative genitourinary   Musculoskeletal   Abdominal   Peds  Hematology negative hematology ROS (+)   Anesthesia Other Findings See surgeon's H&P   Reproductive/Obstetrics negative OB ROS                           Anesthesia Physical Anesthesia Plan  ASA: II  Anesthesia Plan: General   Post-op Pain Management:    Induction: Intravenous  Airway Management Planned: LMA  Additional Equipment:   Intra-op Plan:   Post-operative Plan:   Informed Consent: I have reviewed the patients History and Physical, chart, labs and discussed the procedure including the risks, benefits and alternatives for the proposed anesthesia with the patient or authorized representative who has indicated his/her understanding and acceptance.   Dental Advisory Given  Plan Discussed with: CRNA and Surgeon  Anesthesia Plan Comments:         Anesthesia Quick Evaluation

## 2013-07-01 ENCOUNTER — Encounter (HOSPITAL_BASED_OUTPATIENT_CLINIC_OR_DEPARTMENT_OTHER): Payer: Self-pay | Admitting: Orthopaedic Surgery

## 2013-07-01 NOTE — Op Note (Signed)
NAME:  Sean Horn, Sean Horn NO.:  [id number]  MEDICAL RECORD NO.:  [id number]  LOCATION:                                 FACILITY:  PHYSICIAN:  Lubertha Basque. Latrise Bowland, M.D.DATE OF BIRTH:  [birth year]  DATE OF PROCEDURE:  06/30/2013 DATE OF DISCHARGE:  06/30/2013                              OPERATIVE REPORT   PREOPERATIVE DIAGNOSIS:  Pain right knee snapping patella syndrome.  POSTOPERATIVE DIAGNOSIS:  Pain right knee snapping patella syndrome.  PROCEDURE:  Right knee arthroscopic synovectomy and plica excision.  ANESTHESIA:  General.  ATTENDING SURGEON:  Lubertha Basque. Jerl Santos, MD  ASSISTANT:  Elodia Florence, PA  INDICATION FOR PROCEDURE:  The patient is a 52 year old man less than a year from a right knee replacement.  He has recuperated well and regained grade motion and really had no effusion or worrisome signs that has persisted with an anterior knee pain on walking.  He has been ruled out for infection with joint aspiration and other studies.  We presume as a snapping patella syndrome and achieve 100% relief with an intra- articular injection, but this only lasted for a few hours.  At this point, he is offered arthroscopy.  Informed operative consent was obtained after discussion of possible complications including reaction to anesthesia and infection.  SUMMARY OF FINDINGS AND PROCEDURE:  Under general anesthesia through 2 portals we performed an arthroscopy of the replaced right knee.  He did have a lot of synovial tissue in the patellofemoral portion of the joint and this was removed.  He also had a band of tissue crossing across the medial border of the patella which seemed fairly thick and this was excised.  There was a second band on the inferior aspect of the replaced patella which I also excised.  He was discharged home the same day.  DESCRIPTION OF PROCEDURE:  The patient was taken to the operating suite where general anesthetic was applied without  difficulty.  No knee block was performed.  He was positioned supine and prepped and draped in normal sterile fashion.  After the administration of preop IV Kefzol and appropriate time out, an arthroscopy of the right knee was performed through 2 inferior portals.  Findings were as noted above.  The procedure consisted basically of synovectomy and plica excision.  The knee was thoroughly irrigated at the end of the case followed by placement of some Marcaine with epinephrine.  We did use nylon to loosely reapproximate the 2 portals.  Adaptic was applied followed by dry gauze and loose Ace wrap.  Estimated blood loss and intraoperative fluids can be obtained from anesthesia records.  DISPOSITION:  The patient was extubated in the operating room and taken to recovery room in stable addition.  He is to go home same-day and follow up in the office in less than 1 week.  I will contact him by phone tonight.     Lubertha Basque Jerl Santos, M.D.     PGD/MEDQ  D:  06/30/2013  T:  07/01/2013  Job:  [id number]

## 2013-07-01 NOTE — Op Note (Deleted)
NAME:  Horn, Sean              ACCOUNT NO.:  [id number]  MEDICAL RECORD NO.:  [id number]  LOCATION:                                 FACILITY:  PHYSICIAN:  Lakeyia Surber G. Colleena Kurtenbach, M.D.DATE OF BIRTH:  09/10/1960  DATE OF PROCEDURE:  06/30/2013 DATE OF DISCHARGE:  06/30/2013                              OPERATIVE REPORT   PREOPERATIVE DIAGNOSIS:  Pain right knee snapping patella syndrome.  POSTOPERATIVE DIAGNOSIS:  Pain right knee snapping patella syndrome.  PROCEDURE:  Right knee arthroscopic synovectomy and plica excision.  ANESTHESIA:  General.  ATTENDING SURGEON:  Amaka Gluth G. Joshwa Hemric, MD  ASSISTANT:  Andrew Nida, PA  INDICATION FOR PROCEDURE:  The patient is a 51-year-old man less than a year from a right knee replacement.  He has recuperated well and regained grade motion and really had no effusion or worrisome signs that has persisted with an anterior knee pain on walking.  He has been ruled out for infection with joint aspiration and other studies.  We presume as a snapping patella syndrome and achieve 100% relief with an intra- articular injection, but this only lasted for a few hours.  At this point, he is offered arthroscopy.  Informed operative consent was obtained after discussion of possible complications including reaction to anesthesia and infection.  SUMMARY OF FINDINGS AND PROCEDURE:  Under general anesthesia through 2 portals we performed an arthroscopy of the replaced right knee.  He did have a lot of synovial tissue in the patellofemoral portion of the joint and this was removed.  He also had a band of tissue crossing across the medial border of the patella which seemed fairly thick and this was excised.  There was a second band on the inferior aspect of the replaced patella which I also excised.  He was discharged home the same day.  DESCRIPTION OF PROCEDURE:  The patient was taken to the operating suite where general anesthetic was applied without  difficulty.  No knee block was performed.  He was positioned supine and prepped and draped in normal sterile fashion.  After the administration of preop IV Kefzol and appropriate time out, an arthroscopy of the right knee was performed through 2 inferior portals.  Findings were as noted above.  The procedure consisted basically of synovectomy and plica excision.  The knee was thoroughly irrigated at the end of the case followed by placement of some Marcaine with epinephrine.  We did use nylon to loosely reapproximate the 2 portals.  Adaptic was applied followed by dry gauze and loose Ace wrap.  Estimated blood loss and intraoperative fluids can be obtained from anesthesia records.  DISPOSITION:  The patient was extubated in the operating room and taken to recovery room in stable addition.  He is to go home same-day and follow up in the office in less than 1 week.  I will contact him by phone tonight.     Jovanna Hodges G. Evva Din, M.D.     PGD/MEDQ  D:  06/30/2013  T:  07/01/2013  Job:  [id number] 

## 2013-07-17 ENCOUNTER — Encounter: Payer: Self-pay | Admitting: *Deleted

## 2013-07-17 ENCOUNTER — Ambulatory Visit (INDEPENDENT_AMBULATORY_CARE_PROVIDER_SITE_OTHER): Payer: Managed Care, Other (non HMO) | Admitting: Cardiovascular Disease

## 2013-07-17 ENCOUNTER — Encounter: Payer: Self-pay | Admitting: Cardiovascular Disease

## 2013-07-17 VITALS — BP 98/70 | HR 88 | Ht 72.0 in | Wt 201.0 lb

## 2013-07-17 DIAGNOSIS — M1990 Unspecified osteoarthritis, unspecified site: Secondary | ICD-10-CM | POA: Insufficient documentation

## 2013-07-17 DIAGNOSIS — E118 Type 2 diabetes mellitus with unspecified complications: Secondary | ICD-10-CM | POA: Insufficient documentation

## 2013-07-17 DIAGNOSIS — M1711 Unilateral primary osteoarthritis, right knee: Secondary | ICD-10-CM

## 2013-07-17 DIAGNOSIS — Z9889 Other specified postprocedural states: Secondary | ICD-10-CM

## 2013-07-17 DIAGNOSIS — I10 Essential (primary) hypertension: Secondary | ICD-10-CM

## 2013-07-17 DIAGNOSIS — R000 Tachycardia, unspecified: Secondary | ICD-10-CM

## 2013-07-17 DIAGNOSIS — R079 Chest pain, unspecified: Secondary | ICD-10-CM

## 2013-07-17 DIAGNOSIS — F419 Anxiety disorder, unspecified: Secondary | ICD-10-CM | POA: Insufficient documentation

## 2013-07-17 DIAGNOSIS — R112 Nausea with vomiting, unspecified: Secondary | ICD-10-CM | POA: Insufficient documentation

## 2013-07-17 DIAGNOSIS — K219 Gastro-esophageal reflux disease without esophagitis: Secondary | ICD-10-CM | POA: Insufficient documentation

## 2013-07-17 DIAGNOSIS — Z87442 Personal history of urinary calculi: Secondary | ICD-10-CM | POA: Insufficient documentation

## 2013-07-17 DIAGNOSIS — M1710 Unilateral primary osteoarthritis, unspecified knee: Secondary | ICD-10-CM

## 2013-07-17 DIAGNOSIS — Z794 Long term (current) use of insulin: Secondary | ICD-10-CM

## 2013-07-17 DIAGNOSIS — IMO0002 Reserved for concepts with insufficient information to code with codable children: Secondary | ICD-10-CM

## 2013-07-17 DIAGNOSIS — E119 Type 2 diabetes mellitus without complications: Secondary | ICD-10-CM

## 2013-07-17 NOTE — Assessment & Plan Note (Signed)
Long talk with him about seriousness of his DM and need to control it.  F/U Dr Chestine Spore

## 2013-07-17 NOTE — Assessment & Plan Note (Signed)
Well controlled.  Continue current medications and low sodium Dash type diet.    

## 2013-07-17 NOTE — Assessment & Plan Note (Signed)
Likely related to uncontrolled DM and some dysautonomia.  Will order 24 hours monitor to document normal circadian variation and see what average HR is. Echo to assess RV and LV function.  At some point will need stress test but cannot walk with knee pain and will wait for initial evaluation before doing

## 2013-07-17 NOTE — Progress Notes (Signed)
Patient ID: Sean Horn, male   DOB: 1960/12/01, 52 y.o.   MRN: [id number]  52 yo poorly controlled diabetic referred by Dr Chestine Spore for persistent tachycardia.  He had TKR in May with need for repeat arthroscopy for adhesions. Has been diabetic for 7 years  A1c 2 weeks ago was 12.7.  He does not check his BS at home often enough.  Recently had meds adjusted. HR earlier in year mid 80's Now more in 90-105 range. Only time he notices it is when he wakes up to urinate in middle of night. No syncope or chest pain Mild exertional dyspnea. No sudden onset of palpitations.   TSH and Hct normal on labs end of November   Has had about a 30 lb weight loss.  Had UTI and just finished antibiotics last week     ROS: Denies fever, malais, weight loss, blurry vision, decreased visual acuity, cough, sputum, SOB, hemoptysis, pleuritic pain, palpitaitons, heartburn, abdominal pain, melena, lower extremity edema, claudication, or rash.  All other systems reviewed and negative   General: Affect appropriate Healthy:  appears stated age HEENT: normal Neck supple with no adenopathy JVP normal no bruits no thyromegaly Lungs clear with no wheezing and good diaphragmatic motion Heart:  S1/S2 no murmur,rub, gallop or click PMI normal Abdomen: benighn, BS positve, no tenderness, no AAA no bruit.  No HSM or HJR Distal pulses intact with no bruits No edema Neuro non-focal Skin warm and dry No muscular weakness  Medications Current Outpatient Prescriptions  Medication Sig Dispense Refill  . Canagliflozin-Metformin HCl (INVOKAMET) [phone] MG TABS Take by mouth 2 (two) times daily.      Marland Kitchen glipiZIDE (GLUCOTROL) 10 MG tablet Take 10 mg by mouth 2 (two) times daily before a meal.      . linagliptin (TRADJENTA) 5 MG TABS tablet Take 5 mg by mouth daily.      Marland Kitchen lisinopril-hydrochlorothiazide (PRINZIDE,ZESTORETIC) 20-12.5 MG per tablet Take 1 tablet by mouth daily.      Marland Kitchen oxyCODONE-acetaminophen (PERCOCET/ROXICET)  5-325 MG per tablet Take by mouth every 4 (four) hours as needed for severe pain.       No current facility-administered medications for this visit.    Allergies Toradol  Family History: Family History  Problem Relation Age of Onset  . COPD Mother   . Diabetes Father     Social History: History   Social History  . Marital Status: Single    Spouse Name: N/A    Number of Children: N/A  . Years of Education: N/A   Occupational History  . Not on file.   Social History Main Topics  . Smoking status: Never Smoker   . Smokeless tobacco: Current User    Types: Snuff  . Alcohol Use: Yes     Comment: occasionally  . Drug Use: No  . Sexual Activity: Yes   Other Topics Concern  . Not on file   Social History Narrative  . No narrative on file    Electrocardiogram: Dr Burna Forts office ST rate 101 otherwise normal   Assessment and Plan

## 2013-07-17 NOTE — Patient Instructions (Addendum)
Your physician recommends that you schedule a follow-up appointment in: NEXT  AVAILABLE WITH  DR [facility] Your physician recommends that you continue on your current medications as directed. Please refer to the Current Medication list given to you today.   Your physician has recommended that you wear a holter monitor. Holter monitors are medical devices that record the heart's electrical activity. Doctors most often use these monitors to diagnose arrhythmias. Arrhythmias are problems with the speed or rhythm of the heartbeat. The monitor is a small, portable device. You can wear one while you do your normal daily activities. This is usually used to diagnose what is causing palpitations/syncope (passing out).  Your physician has requested that you have an echocardiogram. Echocardiography is a painless test that uses sound waves to create images of your heart. It provides your doctor with information about the size and shape of your heart and how well your heart's chambers and valves are working. This procedure takes approximately one hour. There are no restrictions for this procedure.

## 2013-07-17 NOTE — Assessment & Plan Note (Signed)
F/U Dr Margreta Journey recent scope PT/OT no signs of infection

## 2013-07-20 ENCOUNTER — Ambulatory Visit (HOSPITAL_COMMUNITY)
Admission: RE | Admit: 2013-07-20 | Discharge: 2013-07-20 | Disposition: A | Discharge status: Home / Self Care | Payer: Managed Care, Other (non HMO) | Source: Ambulatory Visit | Attending: Cardiovascular Disease | Admitting: Cardiovascular Disease

## 2013-07-20 DIAGNOSIS — R000 Tachycardia, unspecified: Secondary | ICD-10-CM | POA: Insufficient documentation

## 2013-07-20 NOTE — Progress Notes (Signed)
24 hour Holter Monitor in progress. 

## 2013-07-24 ENCOUNTER — Ambulatory Visit (HOSPITAL_COMMUNITY)
Admission: RE | Admit: 2013-07-24 | Discharge: 2013-07-24 | Disposition: A | Discharge status: Home / Self Care | Payer: Managed Care, Other (non HMO) | Source: Ambulatory Visit | Attending: Cardiovascular Disease | Admitting: Cardiovascular Disease

## 2013-07-24 DIAGNOSIS — R000 Tachycardia, unspecified: Secondary | ICD-10-CM | POA: Insufficient documentation

## 2013-07-24 DIAGNOSIS — I10 Essential (primary) hypertension: Secondary | ICD-10-CM | POA: Insufficient documentation

## 2013-07-24 DIAGNOSIS — E119 Type 2 diabetes mellitus without complications: Secondary | ICD-10-CM | POA: Insufficient documentation

## 2013-07-24 DIAGNOSIS — I517 Cardiomegaly: Secondary | ICD-10-CM

## 2013-07-24 NOTE — Progress Notes (Signed)
*  PRELIMINARY RESULTS* Echocardiogram 2D Echocardiogram has been performed.  Sean Horn 07/24/2013, 3:34 PM

## 2013-07-28 ENCOUNTER — Telehealth: Payer: Self-pay | Admitting: *Deleted

## 2013-07-28 MED ORDER — CARVEDILOL 3.125 MG PO TABS: 3.1250 mg | ORAL_TABLET | Freq: Two times a day (BID) | ORAL | Status: DC

## 2013-07-28 NOTE — Telephone Encounter (Signed)
Message copied by Barrie Folk on Tue Jul 28, 2013 11:20 AM ------      Message from: Wendall Stade      Created: Sat Jul 25, 2013 12:38 AM       Echo is normal  Holter with average HR over 100  Start coreg 3.125 bid and f/u with me next available ------

## 2013-07-28 NOTE — Telephone Encounter (Signed)
Pt is aware of Echo & holter monitor results Prescription sent in for Coreg 3.125mg  bid Pt has appt 08/14/13 with Dr. Lynnette Caffey RN

## 2013-08-14 ENCOUNTER — Encounter: Payer: Self-pay | Admitting: Cardiovascular Disease

## 2013-08-14 ENCOUNTER — Ambulatory Visit (INDEPENDENT_AMBULATORY_CARE_PROVIDER_SITE_OTHER): Payer: BC Managed Care – PPO | Admitting: Cardiovascular Disease

## 2013-08-14 VITALS — BP 128/100 | HR 80 | Ht 72.0 in | Wt 207.2 lb

## 2013-08-14 DIAGNOSIS — I10 Essential (primary) hypertension: Secondary | ICD-10-CM

## 2013-08-14 DIAGNOSIS — Z0000 Encounter for general adult medical examination without abnormal findings: Secondary | ICD-10-CM

## 2013-08-14 DIAGNOSIS — E119 Type 2 diabetes mellitus without complications: Secondary | ICD-10-CM

## 2013-08-14 DIAGNOSIS — R000 Tachycardia, unspecified: Secondary | ICD-10-CM

## 2013-08-14 DIAGNOSIS — Z01810 Encounter for preprocedural cardiovascular examination: Secondary | ICD-10-CM

## 2013-08-14 HISTORY — DX: Encounter for general adult medical examination without abnormal findings: Z00.00

## 2013-08-14 NOTE — Progress Notes (Signed)
Patient ID: Sean KneeCharles W Horn, male   DOB: 09/15/1960, 53 y.o.   MRN: [id number] 53 yo poorly controlled diabetic referred by Dr Chestine Sporelark for persistent tachycardia. He had TKR in May with need for repeat arthroscopy for adhesions. Has been diabetic for 7 years  A1c 2 weeks ago was 12.7. He does not check his BS at home often enough. Recently had meds adjusted. HR earlier in year mid 80's Now more in 90-105 range. Only time he notices it is when he wakes up to urinate in middle of night. No syncope or chest pain Mild exertional dyspnea. No sudden onset of palpitations. TSH and Hct normal on labs end of November Has had about a 30 lb weight loss. Had UTI and just finished antibiotics last week   F/U echo showed no valve disease and normal EF  Reviewed 24 hr holter with average HR over 100 and no arrhythmia   Started on coreg   Doing well had arthroscopy but still with a lot of knee pain taking percocet.     ROS: Denies fever, malais, weight loss, blurry vision, decreased visual acuity, cough, sputum, SOB, hemoptysis, pleuritic pain, palpitaitons, heartburn, abdominal pain, melena, lower extremity edema, claudication, or rash.  All other systems reviewed and negative  General: Affect appropriate Healthy:  appears stated age HEENT: normal Neck supple with no adenopathy JVP normal no bruits no thyromegaly Lungs clear with no wheezing and good diaphragmatic motion Heart:  S1/S2 no murmur, no rub, gallop or click PMI normal Abdomen: benighn, BS positve, no tenderness, no AAA no bruit.  No HSM or HJR Distal pulses intact with no bruits No edema Neuro non-focal Skin warm and dry No muscular weakness   Current Outpatient Prescriptions  Medication Sig Dispense Refill  . Canagliflozin-Metformin HCl (INVOKAMET) [phone] MG TABS Take by mouth 2 (two) times daily.      . carvedilol (COREG) 3.125 MG tablet Take 1 tablet (3.125 mg total) by mouth 2 (two) times daily.  180 tablet  3  . glipiZIDE  (GLUCOTROL) 10 MG tablet Take 10 mg by mouth 2 (two) times daily before a meal.      . linagliptin (TRADJENTA) 5 MG TABS tablet Take 5 mg by mouth daily.      Marland Kitchen. lisinopril-hydrochlorothiazide (PRINZIDE,ZESTORETIC) 20-12.5 MG per tablet Take 1 tablet by mouth daily.      Marland Kitchen. oxyCODONE-acetaminophen (PERCOCET/ROXICET) 5-325 MG per tablet Take by mouth every 4 (four) hours as needed for severe pain.       No current facility-administered medications for this visit.    Allergies  Toradol  Electrocardiogram:  SR low voltage no acute ST/T wave changes   Assessment and Plan

## 2013-08-14 NOTE — Assessment & Plan Note (Signed)
Likely to need more surgery on knee Will get lexiscan myovue given risk factors and poorly controlled DM

## 2013-08-14 NOTE — Assessment & Plan Note (Signed)
Well controlled.  Continue current medications and low sodium Dash type diet.    

## 2013-08-14 NOTE — Assessment & Plan Note (Signed)
Would appear to be physiologic  Improved with low dose coreg  Normal circadian rhythm on holter.

## 2013-08-14 NOTE — Patient Instructions (Signed)
Your physician wants you to follow-up in:   YEAR WITH  DR NISHAN You will receive a reminder letter in the mail two months in advance. If you don't receive a letter, please call our office to schedule the follow-up appointment. Your physician recommends that you continue on your current medications as directed. Please refer to the Current Medication list given to you today.  Your physician has requested that you have en exercise stress myoview. For further information please visit [email or web address]. Please follow instruction sheet, as given.  

## 2013-08-14 NOTE — Assessment & Plan Note (Signed)
Discussed low carb diet.  Target hemoglobin A1c is 6.5 or less.  Continue current medications.  

## 2013-08-17 ENCOUNTER — Other Ambulatory Visit: Payer: Self-pay | Admitting: *Deleted

## 2013-08-17 DIAGNOSIS — R079 Chest pain, unspecified: Secondary | ICD-10-CM

## 2013-08-17 NOTE — Addendum Note (Signed)
Addended by: Thompson GrayerURHAM, Justyn Langham C on: 08/17/2013 12:02 PM   Modules accepted: Orders

## 2013-08-20 ENCOUNTER — Inpatient Hospital Stay (HOSPITAL_COMMUNITY): Admission: RE | Admit: 2013-08-20 | Payer: BC Managed Care – PPO | Source: Ambulatory Visit

## 2013-08-20 ENCOUNTER — Encounter (HOSPITAL_COMMUNITY): Payer: BC Managed Care – PPO

## 2014-02-17 ENCOUNTER — Other Ambulatory Visit (HOSPITAL_COMMUNITY): Payer: Self-pay | Admitting: Orthopaedic Surgery

## 2014-02-17 DIAGNOSIS — M25561 Pain in right knee: Secondary | ICD-10-CM

## 2014-03-03 ENCOUNTER — Ambulatory Visit (HOSPITAL_COMMUNITY): Payer: BC Managed Care – PPO

## 2014-03-08 ENCOUNTER — Encounter (HOSPITAL_COMMUNITY)
Admission: RE | Admit: 2014-03-08 | Discharge: 2014-03-08 | Disposition: A | Discharge status: Home / Self Care | Payer: 59 | Source: Ambulatory Visit | Attending: Orthopaedic Surgery | Admitting: Orthopaedic Surgery

## 2014-03-08 DIAGNOSIS — Z96659 Presence of unspecified artificial knee joint: Secondary | ICD-10-CM | POA: Diagnosis not present

## 2014-03-08 DIAGNOSIS — M25561 Pain in right knee: Secondary | ICD-10-CM

## 2014-03-08 DIAGNOSIS — M25469 Effusion, unspecified knee: Secondary | ICD-10-CM | POA: Diagnosis not present

## 2014-03-08 DIAGNOSIS — M25569 Pain in unspecified knee: Secondary | ICD-10-CM | POA: Diagnosis not present

## 2014-03-08 MED ORDER — TECHNETIUM TC 99M MEDRONATE IV KIT
24.6000 | PACK | Freq: Once | INTRAVENOUS | Status: AC | PRN
  Administered 2014-03-08: 24.6 via INTRAVENOUS

## 2014-08-09 IMAGING — CR DG CHEST 2V
2 series · 2 of 2 positions shown · non-contrast
Comparison: 12/30/2007

CLINICAL DATA: Preoperative evaluation for total knee surgery

CHEST - 2 VIEW

[view not recorded (1 of 2)]
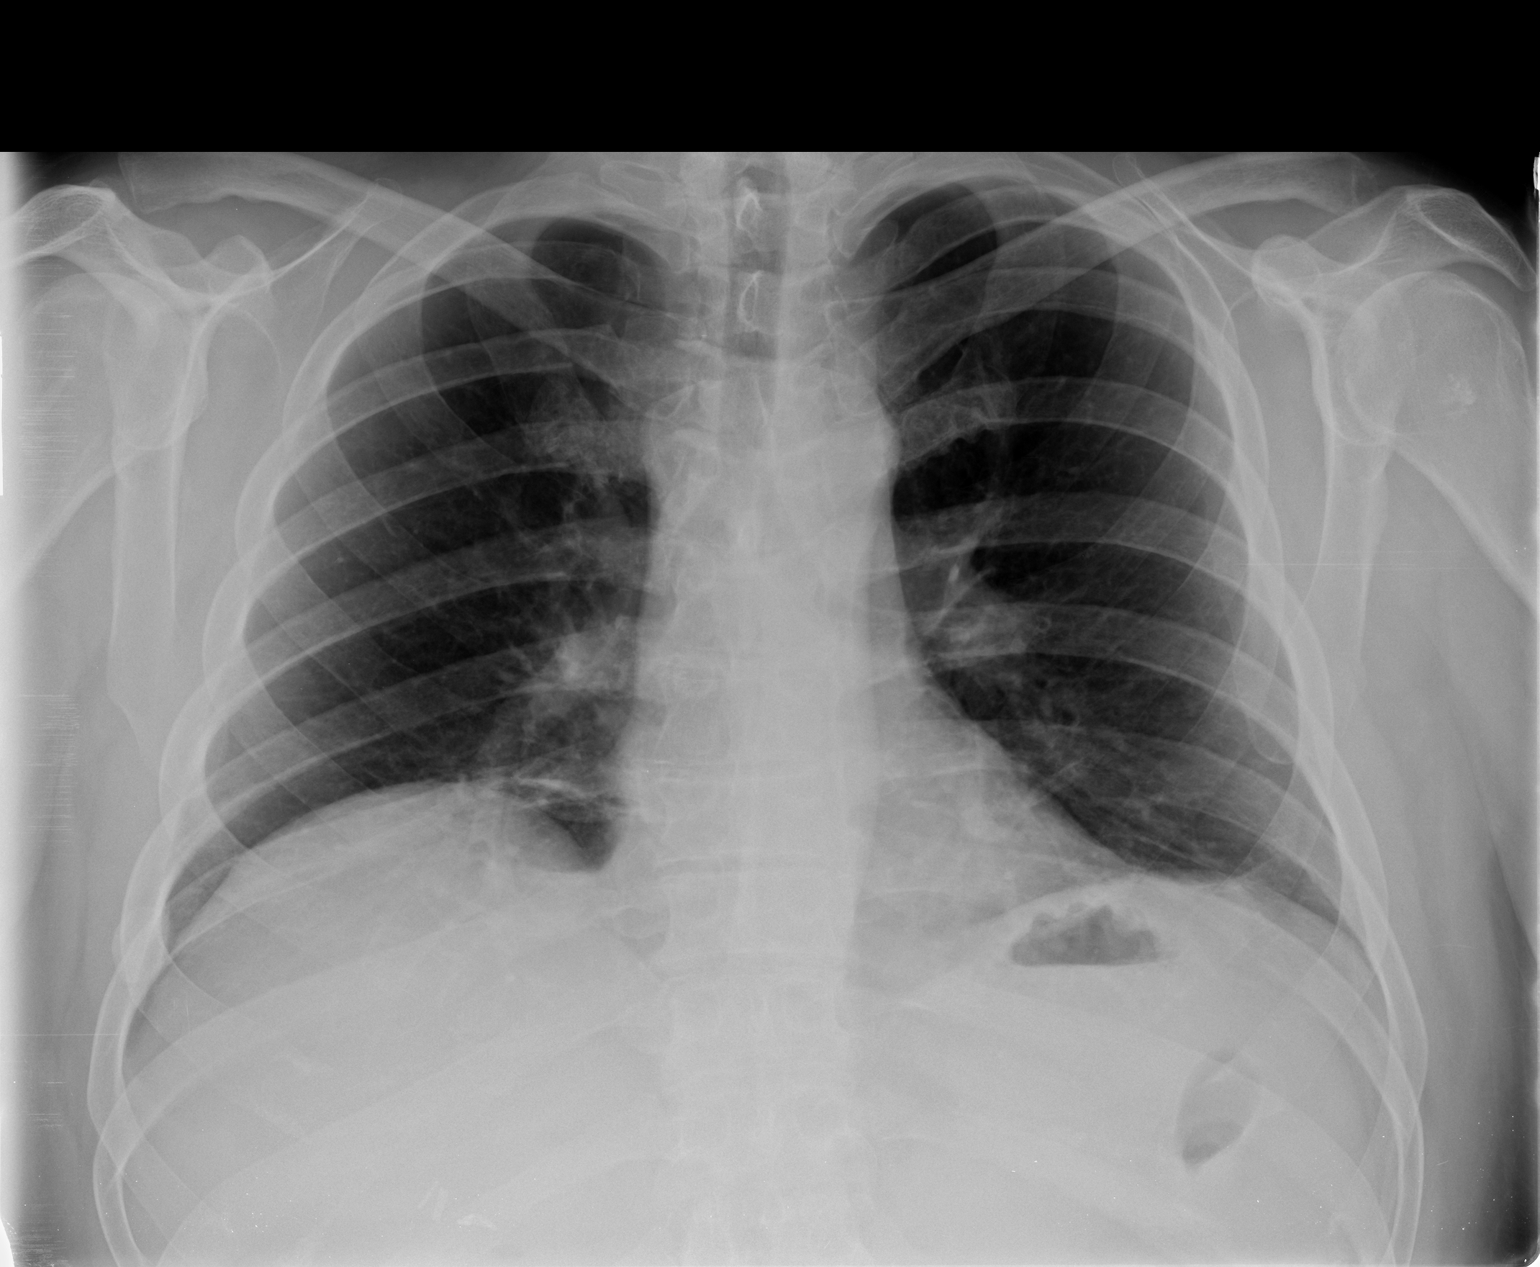

[view not recorded (2 of 2)]
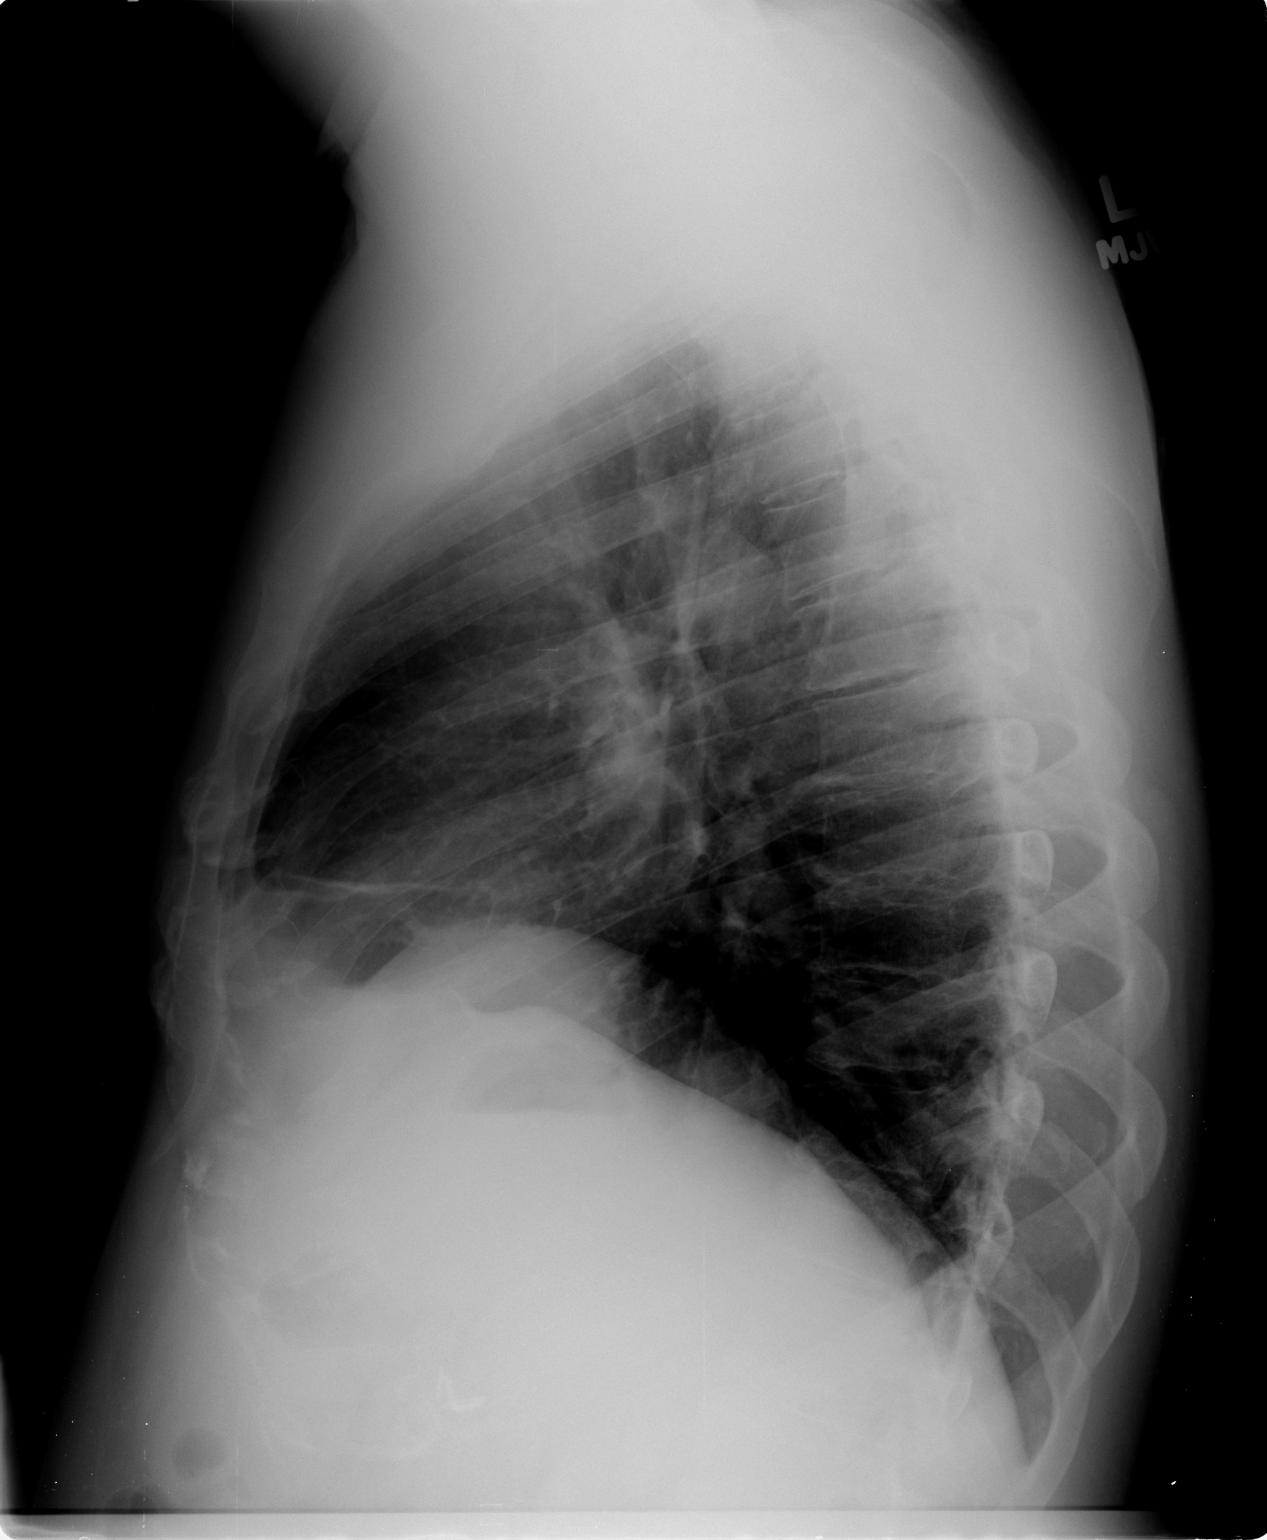

[2 of 2 positions shown; findings below may reference images not displayed]

FINDINGS: The heart and pulmonary vascularity are within normal
limits.  Some minimal right basilar atelectasis is noted.  This
could be related to scarring.  No focal infiltrate or sizable
effusion is noted.  There is a tiny density identified between the
anterior aspects of the second and third ribs on the right
anteriorly consistent with some of the small nodules seen on
previous CT examination.
IMPRESSION: Likely right basilar scarring.

Tiny nodules which appear stable from previous exams.  They are
likely benign in etiology.

## 2014-08-13 ENCOUNTER — Ambulatory Visit (INDEPENDENT_AMBULATORY_CARE_PROVIDER_SITE_OTHER): Payer: 59 | Admitting: Family Medicine

## 2014-08-13 ENCOUNTER — Encounter: Payer: Self-pay | Admitting: Family Medicine

## 2014-08-13 VITALS — BP 149/99 | HR 111 | Temp 97.4°F | Ht 72.0 in | Wt 219.8 lb

## 2014-08-13 DIAGNOSIS — R000 Tachycardia, unspecified: Secondary | ICD-10-CM

## 2014-08-13 DIAGNOSIS — K219 Gastro-esophageal reflux disease without esophagitis: Secondary | ICD-10-CM

## 2014-08-13 DIAGNOSIS — E119 Type 2 diabetes mellitus without complications: Secondary | ICD-10-CM

## 2014-08-13 DIAGNOSIS — Z1211 Encounter for screening for malignant neoplasm of colon: Secondary | ICD-10-CM

## 2014-08-13 DIAGNOSIS — I10 Essential (primary) hypertension: Secondary | ICD-10-CM

## 2014-08-13 DIAGNOSIS — Z8619 Personal history of other infectious and parasitic diseases: Secondary | ICD-10-CM | POA: Insufficient documentation

## 2014-08-13 DIAGNOSIS — M179 Osteoarthritis of knee, unspecified: Secondary | ICD-10-CM

## 2014-08-13 DIAGNOSIS — M1711 Unilateral primary osteoarthritis, right knee: Secondary | ICD-10-CM

## 2014-08-13 DIAGNOSIS — F419 Anxiety disorder, unspecified: Secondary | ICD-10-CM

## 2014-08-13 DIAGNOSIS — M25561 Pain in right knee: Secondary | ICD-10-CM

## 2014-08-13 MED ORDER — CARVEDILOL 6.25 MG PO TABS: 6.2500 mg | ORAL_TABLET | Freq: Two times a day (BID) | ORAL | Status: DC

## 2014-08-13 NOTE — Patient Instructions (Signed)
Preventive Care for Adults A healthy lifestyle and preventive care can promote health and wellness. Preventive health guidelines for men include the following key practices:  A routine yearly physical is a good way to check with your health care provider about your health and preventative screening. It is a chance to share any concerns and updates on your health and to receive a thorough exam.  Visit your dentist for a routine exam and preventative care every 6 months. Brush your teeth twice a day and floss once a day. Good oral hygiene prevents tooth decay and gum disease.  The frequency of eye exams is based on your age, health, family medical history, use of contact lenses, and other factors. Follow your health care provider's recommendations for frequency of eye exams.  Eat a healthy diet. Foods such as vegetables, fruits, whole grains, low-fat dairy products, and lean protein foods contain the nutrients you need without too many calories. Decrease your intake of foods high in solid fats, added sugars, and salt. Eat the right amount of calories for you.Get information about a proper diet from your health care provider, if necessary.  Regular physical exercise is one of the most important things you can do for your health. Most adults should get at least 150 minutes of moderate-intensity exercise (any activity that increases your heart rate and causes you to sweat) each week. In addition, most adults need muscle-strengthening exercises on 2 or more days a week.  Maintain a healthy weight. The body mass index (BMI) is a screening tool to identify possible weight problems. It provides an estimate of body fat based on height and weight. Your health care provider can find your BMI and can help you achieve or maintain a healthy weight.For adults 20 years and older:  A BMI below 18.5 is considered underweight.  A BMI of 18.5 to 24.9 is normal.  A BMI of 25 to 29.9 is considered overweight.  A BMI  of 30 and above is considered obese.  Maintain normal blood lipids and cholesterol levels by exercising and minimizing your intake of saturated fat. Eat a balanced diet with plenty of fruit and vegetables. Blood tests for lipids and cholesterol should begin at age 50 and be repeated every 5 years. If your lipid or cholesterol levels are high, you are over 50, or you are at high risk for heart disease, you may need your cholesterol levels checked more frequently.Ongoing high lipid and cholesterol levels should be treated with medicines if diet and exercise are not working.  If you smoke, find out from your health care provider how to quit. If you do not use tobacco, do not start.  Lung cancer screening is recommended for adults aged 73-80 years who are at high risk for developing lung cancer because of a history of smoking. A yearly low-dose CT scan of the lungs is recommended for people who have at least a 30-pack-year history of smoking and are a current smoker or have quit within the past 15 years. A pack year of smoking is smoking an average of 1 pack of cigarettes a day for 1 year (for example: 1 pack a day for 30 years or 2 packs a day for 15 years). Yearly screening should continue until the smoker has stopped smoking for at least 15 years. Yearly screening should be stopped for people who develop a health problem that would prevent them from having lung cancer treatment.  If you choose to drink alcohol, do not have more than  2 drinks per day. One drink is considered to be 12 ounces (355 mL) of beer, 5 ounces (148 mL) of wine, or 1.5 ounces (44 mL) of liquor.  Avoid use of street drugs. Do not share needles with anyone. Ask for help if you need support or instructions about stopping the use of drugs.  High blood pressure causes heart disease and increases the risk of stroke. Your blood pressure should be checked at least every 1-2 years. Ongoing high blood pressure should be treated with  medicines, if weight loss and exercise are not effective.  If you are 45-79 years old, ask your health care provider if you should take aspirin to prevent heart disease.  Diabetes screening involves taking a blood sample to check your fasting blood sugar level. This should be done once every 3 years, after age 45, if you are within normal weight and without risk factors for diabetes. Testing should be considered at a younger age or be carried out more frequently if you are overweight and have at least 1 risk factor for diabetes.  Colorectal cancer can be detected and often prevented. Most routine colorectal cancer screening begins at the age of 50 and continues through age 75. However, your health care provider may recommend screening at an earlier age if you have risk factors for colon cancer. On a yearly basis, your health care provider may provide home test kits to check for hidden blood in the stool. Use of a small camera at the end of a tube to directly examine the colon (sigmoidoscopy or colonoscopy) can detect the earliest forms of colorectal cancer. Talk to your health care provider about this at age 50, when routine screening begins. Direct exam of the colon should be repeated every 5-10 years through age 75, unless early forms of precancerous polyps or small growths are found.  People who are at an increased risk for hepatitis B should be screened for this virus. You are considered at high risk for hepatitis B if:  You were born in a country where hepatitis B occurs often. Talk with your health care provider about which countries are considered high risk.  Your parents were born in a high-risk country and you have not received a shot to protect against hepatitis B (hepatitis B vaccine).  You have HIV or AIDS.  You use needles to inject street drugs.  You live with, or have sex with, someone who has hepatitis B.  You are a man who has sex with other men (MSM).  You get hemodialysis  treatment.  You take certain medicines for conditions such as cancer, organ transplantation, and autoimmune conditions.  Hepatitis C blood testing is recommended for all people born from 1945 through 1965 and any individual with known risks for hepatitis C.  Practice safe sex. Use condoms and avoid high-risk sexual practices to reduce the spread of sexually transmitted infections (STIs). STIs include gonorrhea, chlamydia, syphilis, trichomonas, herpes, HPV, and human immunodeficiency virus (HIV). Herpes, HIV, and HPV are viral illnesses that have no cure. They can result in disability, cancer, and death.  If you are at risk of being infected with HIV, it is recommended that you take a prescription medicine daily to prevent HIV infection. This is called preexposure prophylaxis (PrEP). You are considered at risk if:  You are a man who has sex with other men (MSM) and have other risk factors.  You are a heterosexual man, are sexually active, and are at increased risk for HIV infection.    You take drugs by injection.  You are sexually active with a partner who has HIV.  Talk with your health care provider about whether you are at high risk of being infected with HIV. If you choose to begin PrEP, you should first be tested for HIV. You should then be tested every 3 months for as long as you are taking PrEP.  A one-time screening for abdominal aortic aneurysm (AAA) and surgical repair of large AAAs by ultrasound are recommended for men ages 32 to 67 years who are current or former smokers.  Healthy men should no longer receive prostate-specific antigen (PSA) blood tests as part of routine cancer screening. Talk with your health care provider about prostate cancer screening.  Testicular cancer screening is not recommended for adult males who have no symptoms. Screening includes self-exam, a health care provider exam, and other screening tests. Consult with your health care provider about any symptoms  you have or any concerns you have about testicular cancer.  Use sunscreen. Apply sunscreen liberally and repeatedly throughout the day. You should seek shade when your shadow is shorter than you. Protect yourself by wearing long sleeves, pants, a wide-brimmed hat, and sunglasses year round, whenever you are outdoors.  Once a month, do a whole-body skin exam, using a mirror to look at the skin on your back. Tell your health care provider about new moles, moles that have irregular borders, moles that are larger than a pencil eraser, or moles that have changed in shape or color.  Stay current with required vaccines (immunizations).  Influenza vaccine. All adults should be immunized every year.  Tetanus, diphtheria, and acellular pertussis (Td, Tdap) vaccine. An adult who has not previously received Tdap or who does not know his vaccine status should receive 1 dose of Tdap. This initial dose should be followed by tetanus and diphtheria toxoids (Td) booster doses every 10 years. Adults with an unknown or incomplete history of completing a 3-dose immunization series with Td-containing vaccines should begin or complete a primary immunization series including a Tdap dose. Adults should receive a Td booster every 10 years.  Varicella vaccine. An adult without evidence of immunity to varicella should receive 2 doses or a second dose if he has previously received 1 dose.  Human papillomavirus (HPV) vaccine. Males aged 68-21 years who have not received the vaccine previously should receive the 3-dose series. Males aged 22-26 years may be immunized. Immunization is recommended through the age of 6 years for any male who has sex with males and did not get any or all doses earlier. Immunization is recommended for any person with an immunocompromised condition through the age of 49 years if he did not get any or all doses earlier. During the 3-dose series, the second dose should be obtained 4-8 weeks after the first  dose. The third dose should be obtained 24 weeks after the first dose and 16 weeks after the second dose.  Zoster vaccine. One dose is recommended for adults aged 50 years or older unless certain conditions are present.  Measles, mumps, and rubella (MMR) vaccine. Adults born before [birth year] generally are considered immune to measles and mumps. Adults born in [birth year] or later should have 1 or more doses of MMR vaccine unless there is a contraindication to the vaccine or there is laboratory evidence of immunity to each of the three diseases. A routine second dose of MMR vaccine should be obtained at least 28 days after the first dose for students attending postsecondary  schools, health care workers, or international travelers. People who received inactivated measles vaccine or an unknown type of measles vaccine during 1963-1967 should receive 2 doses of MMR vaccine. People who received inactivated mumps vaccine or an unknown type of mumps vaccine before 1979 and are at high risk for mumps infection should consider immunization with 2 doses of MMR vaccine. Unvaccinated health care workers born before 1957 who lack laboratory evidence of measles, mumps, or rubella immunity or laboratory confirmation of disease should consider measles and mumps immunization with 2 doses of MMR vaccine or rubella immunization with 1 dose of MMR vaccine.  Pneumococcal 13-valent conjugate (PCV13) vaccine. When indicated, a person who is uncertain of his immunization history and has no record of immunization should receive the PCV13 vaccine. An adult aged 19 years or older who has certain medical conditions and has not been previously immunized should receive 1 dose of PCV13 vaccine. This PCV13 should be followed with a dose of pneumococcal polysaccharide (PPSV23) vaccine. The PPSV23 vaccine dose should be obtained at least 8 weeks after the dose of PCV13 vaccine. An adult aged 19 years or older who has certain medical conditions and  previously received 1 or more doses of PPSV23 vaccine should receive 1 dose of PCV13. The PCV13 vaccine dose should be obtained 1 or more years after the last PPSV23 vaccine dose.  Pneumococcal polysaccharide (PPSV23) vaccine. When PCV13 is also indicated, PCV13 should be obtained first. All adults aged 65 years and older should be immunized. An adult younger than age 65 years who has certain medical conditions should be immunized. Any person who resides in a nursing home or long-term care facility should be immunized. An adult smoker should be immunized. People with an immunocompromised condition and certain other conditions should receive both PCV13 and PPSV23 vaccines. People with human immunodeficiency virus (HIV) infection should be immunized as soon as possible after diagnosis. Immunization during chemotherapy or radiation therapy should be avoided. Routine use of PPSV23 vaccine is not recommended for American Indians, [place] Natives, or people younger than 65 years unless there are medical conditions that require PPSV23 vaccine. When indicated, people who have unknown immunization and have no record of immunization should receive PPSV23 vaccine. One-time revaccination 5 years after the first dose of PPSV23 is recommended for people aged 19-64 years who have chronic kidney failure, nephrotic syndrome, asplenia, or immunocompromised conditions. People who received 1-2 doses of PPSV23 before age 65 years should receive another dose of PPSV23 vaccine at age 65 years or later if at least 5 years have passed since the previous dose. Doses of PPSV23 are not needed for people immunized with PPSV23 at or after age 65 years.  Meningococcal vaccine. Adults with asplenia or persistent complement component deficiencies should receive 2 doses of quadrivalent meningococcal conjugate (MenACWY-D) vaccine. The doses should be obtained at least 2 months apart. Microbiologists working with certain meningococcal bacteria,  military recruits, people at risk during an outbreak, and people who travel to or live in countries with a high rate of meningitis should be immunized. A first-year college student up through age 21 years who is living in a residence hall should receive a dose if he did not receive a dose on or after his 16th birthday. Adults who have certain high-risk conditions should receive one or more doses of vaccine.  Hepatitis A vaccine. Adults who wish to be protected from this disease, have certain high-risk conditions, work with hepatitis A-infected animals, work in hepatitis A research labs, or   travel to or work in countries with a high rate of hepatitis A should be immunized. Adults who were previously unvaccinated and who anticipate close contact with an international adoptee during the first 60 days after arrival in the [place] from a country with a high rate of hepatitis A should be immunized.  Hepatitis B vaccine. Adults should be immunized if they wish to be protected from this disease, have certain high-risk conditions, may be exposed to blood or other infectious body fluids, are household contacts or sex partners of hepatitis B positive people, are clients or workers in certain care facilities, or travel to or work in countries with a high rate of hepatitis B.  Haemophilus influenzae type b (Hib) vaccine. A previously unvaccinated person with asplenia or sickle cell disease or having a scheduled splenectomy should receive 1 dose of Hib vaccine. Regardless of previous immunization, a recipient of a hematopoietic stem cell transplant should receive a 3-dose series 6-12 months after his successful transplant. Hib vaccine is not recommended for adults with HIV infection. Preventive Service / Frequency Ages 52 to 17  Blood pressure check.** / Every 1 to 2 years.  Lipid and cholesterol check.** / Every 5 years beginning at age 69.  Hepatitis C blood test.** / For any individual with known risks for  hepatitis C.  Skin self-exam. / Monthly.  Influenza vaccine. / Every year.  Tetanus, diphtheria, and acellular pertussis (Tdap, Td) vaccine.** / Consult your health care provider. 1 dose of Td every 10 years.  Varicella vaccine.** / Consult your health care provider.  HPV vaccine. / 3 doses over 6 months, if 72 or younger.  Measles, mumps, rubella (MMR) vaccine.** / You need at least 1 dose of MMR if you were born in 1957 or later. You may also need a second dose.  Pneumococcal 13-valent conjugate (PCV13) vaccine.** / Consult your health care provider.  Pneumococcal polysaccharide (PPSV23) vaccine.** / 1 to 2 doses if you smoke cigarettes or if you have certain conditions.  Meningococcal vaccine.** / 1 dose if you are age 35 to 60 years and a Market researcher living in a residence hall, or have one of several medical conditions. You may also need additional booster doses.  Hepatitis A vaccine.** / Consult your health care provider.  Hepatitis B vaccine.** / Consult your health care provider.  Haemophilus influenzae type b (Hib) vaccine.** / Consult your health care provider. Ages 35 to 8  Blood pressure check.** / Every 1 to 2 years.  Lipid and cholesterol check.** / Every 5 years beginning at age 57.  Lung cancer screening. / Every year if you are aged 44-80 years and have a 30-pack-year history of smoking and currently smoke or have quit within the past 15 years. Yearly screening is stopped once you have quit smoking for at least 15 years or develop a health problem that would prevent you from having lung cancer treatment.  Fecal occult blood test (FOBT) of stool. / Every year beginning at age 55 and continuing until age 73. You may not have to do this test if you get a colonoscopy every 10 years.  Flexible sigmoidoscopy** or colonoscopy.** / Every 5 years for a flexible sigmoidoscopy or every 10 years for a colonoscopy beginning at age 28 and continuing until age  1.  Hepatitis C blood test.** / For all people born from [birth year] through 1965 and any individual with known risks for hepatitis C.  Skin self-exam. / Monthly.  Influenza vaccine. / Every  year.  Tetanus, diphtheria, and acellular pertussis (Tdap/Td) vaccine.** / Consult your health care provider. 1 dose of Td every 10 years.  Varicella vaccine.** / Consult your health care provider.  Zoster vaccine.** / 1 dose for adults aged 53 years or older.  Measles, mumps, rubella (MMR) vaccine.** / You need at least 1 dose of MMR if you were born in 1957 or later. You may also need a second dose.  Pneumococcal 13-valent conjugate (PCV13) vaccine.** / Consult your health care provider.  Pneumococcal polysaccharide (PPSV23) vaccine.** / 1 to 2 doses if you smoke cigarettes or if you have certain conditions.  Meningococcal vaccine.** / Consult your health care provider.  Hepatitis A vaccine.** / Consult your health care provider.  Hepatitis B vaccine.** / Consult your health care provider.  Haemophilus influenzae type b (Hib) vaccine.** / Consult your health care provider. Ages 77 and over  Blood pressure check.** / Every 1 to 2 years.  Lipid and cholesterol check.**/ Every 5 years beginning at age 85.  Lung cancer screening. / Every year if you are aged 55-80 years and have a 30-pack-year history of smoking and currently smoke or have quit within the past 15 years. Yearly screening is stopped once you have quit smoking for at least 15 years or develop a health problem that would prevent you from having lung cancer treatment.  Fecal occult blood test (FOBT) of stool. / Every year beginning at age 33 and continuing until age 11. You may not have to do this test if you get a colonoscopy every 10 years.  Flexible sigmoidoscopy** or colonoscopy.** / Every 5 years for a flexible sigmoidoscopy or every 10 years for a colonoscopy beginning at age 28 and continuing until age 73.  Hepatitis C blood  test.** / For all people born from [birth year] through 1965 and any individual with known risks for hepatitis C.  Abdominal aortic aneurysm (AAA) screening.** / A one-time screening for ages 50 to 27 years who are current or former smokers.  Skin self-exam. / Monthly.  Influenza vaccine. / Every year.  Tetanus, diphtheria, and acellular pertussis (Tdap/Td) vaccine.** / 1 dose of Td every 10 years.  Varicella vaccine.** / Consult your health care provider.  Zoster vaccine.** / 1 dose for adults aged 34 years or older.  Pneumococcal 13-valent conjugate (PCV13) vaccine.** / Consult your health care provider.  Pneumococcal polysaccharide (PPSV23) vaccine.** / 1 dose for all adults aged 63 years and older.  Meningococcal vaccine.** / Consult your health care provider.  Hepatitis A vaccine.** / Consult your health care provider.  Hepatitis B vaccine.** / Consult your health care provider.  Haemophilus influenzae type b (Hib) vaccine.** / Consult your health care provider. **Family history and personal history of risk and conditions may change your health care provider's recommendations. Document Released: 09/18/2001 Document Revised: 07/28/2013 Document Reviewed: 12/18/2010 [facility] Patient Information 2015 [place], [place]. This information is not intended to replace advice given to you by your health care provider. Make sure you discuss any questions you have with your health care provider.

## 2014-08-13 NOTE — Assessment & Plan Note (Signed)
Avoid offending foods, start probiotics. Do not eat large meals in late evening and consider raising head of bed.  

## 2014-08-13 NOTE — Progress Notes (Signed)
Pre visit review using our clinic review tool, if applicable. No additional management support is needed unless otherwise documented below in the visit note. 

## 2014-08-16 ENCOUNTER — Encounter: Payer: Self-pay | Admitting: Gastroenterology

## 2014-08-16 ENCOUNTER — Telehealth: Payer: Self-pay | Admitting: Family Medicine

## 2014-08-16 NOTE — Telephone Encounter (Signed)
emmi emailed °

## 2014-08-22 NOTE — Assessment & Plan Note (Signed)
Tolerating Excitalopram, no changes toay

## 2014-08-22 NOTE — Assessment & Plan Note (Signed)
Following with orthopaedics, encouraged Tylenol, salon Pas and Curcumen as needed

## 2014-08-22 NOTE — Assessment & Plan Note (Signed)
Poorly controlled will alter medications, encouraged DASH diet, minimize caffeine and obtain adequate sleep. Report concerning symptoms and follow up as directed and as needed. Increase carvedilol

## 2014-08-22 NOTE — Progress Notes (Signed)
Sean Horn [id number] 05/10/1961 08/22/2014      Progress Note New Patient  Subjective  Chief Complaint  Chief Complaint  Patient presents with  . Establish Care    knee pain(R)-pt had knee replacement x 2 years    HPI  Patient is a 54 year old male in today for routine medical care. Patient is in today to establish care. His biggest concern is of chronic knee pain. He has had several surgeries on his right knee and unfortunately has had significant damage has chronic pain in the knee. He does use hydrocodone at bedtime for comfort but otherwise manages to stay. He is unable to work as a result the pain is on permanent disability. He follows with endocrinology and has appointments roughly every 6 months. No recent illness. Denies CP/palp/SOB/HA/congestion/fevers/GI or GU c/o. Taking meds as prescribed  Past Medical History  Diagnosis Date  . PONV (postoperative nausea and vomiting)   . Anxiety   . History of kidney stones   . GERD (gastroesophageal reflux disease)     occasional - OTC as needed  . Non-insulin dependent type 2 diabetes mellitus   . Hypertension     under control with med., had been on med. since age 54  . Osteoarthritis     right knee  . PONV (postoperative nausea and vomiting)   . Tachycardia   . History of chicken pox   . History of shingles     Past Surgical History  Procedure Laterality Date  . Wrist surgery Right     x 3, torn ligaments, pins placed then infected, then fused with plate  . Shoulder arthroscopy w/ rotator cuff repair Left 10/20/2004    x 4, had to have part of clavile removed. torn rotator cuff multiple times, had to place screws   . Knee arthroscopy Left 1980's  . Cholecystectomy  1980's  . 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 Shoulder adhesion release Left `  1998  . Knee arthroscopy Right 2013  . Lithotripsy      multiple, b/l  . Cystoscopy/retrograde/ureteroscopy/stone extraction with basket Right 08/27/2003    and double J stent placement  . Shoulder arthroscopy w/ superior labral anterior posterior lesion repair Left 12/22/2004  . Knee arthroscopy Right 06/30/2013    Procedure: RIGHT KNEE ARTHROSCOPY AND SYNOVECTOMY;  Surgeon: Velna OchsPeter G Dalldorf, MD;  Location: [facility];  Service: Orthopedics;  Laterality: Right;  . Inguinal hernia repair Left 1990's    Family History  Problem Relation Age of Onset  . COPD Mother     smoker  . Hypertension Mother   . Heart disease Mother     CHF  . Diabetes Father   . Heart disease Father     quintuple bypass  . Stroke Father   . Hypertension Father   . Peripheral vascular disease Father   . Hypertension Son     tachycardia  . Heart disease Maternal Grandfather     History   Social History  . Marital Status: Single    Spouse Name: N/A    Number of Children: N/A  . Years of Education: N/A   Occupational History  . Not on file.   Social History Main Topics  . Smoking status: Never Smoker   . Smokeless tobacco: Current User  Types: Snuff  . Alcohol Use: Yes     Comment: occasionally  . Drug Use: No  . Sexual Activity: Yes     Comment: currently applying for disability due to knee, lives with girfriend, no dietary restrictions, previously Games developer   Other Topics Concern  . Not on file   Social History Narrative    Current Outpatient Prescriptions on File Prior to Visit  Medication Sig Dispense Refill  . Canagliflozin-Metformin HCl (INVOKAMET) [id number] MG TABS Take by mouth 2 (two) times daily.    Marland Kitchen glipiZIDE (GLUCOTROL) 10 MG tablet Take 10 mg by mouth 2 (two) times daily before a meal.    . lisinopril-hydrochlorothiazide (PRINZIDE,ZESTORETIC) 20-12.5 MG per tablet Take 1 tablet by mouth daily.     No current facility-administered medications on file  prior to visit.    Allergies  Allergen Reactions  . Toradol [Ketorolac Tromethamine] Itching    Review of Systems  Review of Systems  Constitutional: Positive for malaise/fatigue. Negative for fever and chills.  HENT: Negative for congestion, hearing loss and nosebleeds.   Eyes: Negative for discharge.  Respiratory: Negative for cough, sputum production, shortness of breath and wheezing.   Cardiovascular: Negative for chest pain, palpitations and leg swelling.  Gastrointestinal: Negative for heartburn, nausea, vomiting, abdominal pain, diarrhea, constipation and blood in stool.  Genitourinary: Negative for dysuria, urgency, frequency and hematuria.  Musculoskeletal: Positive for joint pain. Negative for myalgias, back pain and falls.  Skin: Negative for rash.  Neurological: Negative for dizziness, tremors, sensory change, focal weakness, loss of consciousness, weakness and headaches.  Endo/Heme/Allergies: Negative for polydipsia. Does not bruise/bleed easily.  Psychiatric/Behavioral: Negative for depression and suicidal ideas. The patient is nervous/anxious. The patient does not have insomnia.     Objective  BP 149/99 mmHg  Pulse 111  Temp(Src) 97.4 F (36.3 C) (Oral)  Ht 6' (1.829 m)  Wt 219 lb 12.8 oz (99.701 kg)  BMI 29.80 kg/m2  SpO2 95%  Physical Exam  Physical Exam  Constitutional: He is oriented to person, place, and time and well-developed, well-nourished, and in no distress. No distress.  HENT:  Head: Normocephalic and atraumatic.  Eyes: Conjunctivae are normal.  Neck: Neck supple. No thyromegaly present.  Cardiovascular: Normal rate, regular rhythm and normal heart sounds.   No murmur heard. Pulmonary/Chest: Effort normal and breath sounds normal. No respiratory distress.  Abdominal: He exhibits no distension and no mass. There is no tenderness.  Musculoskeletal: He exhibits no edema.  Neurological: He is alert and oriented to person, place, and time.   Skin: Skin is warm.  Vertical scar over right knee  Psychiatric: Memory, affect and judgment normal.       Assessment & Plan  GERD (gastroesophageal reflux disease) Avoid offending foods, start probiotics. Do not eat large meals in late evening and consider raising head of bed.    Hypertension Poorly controlled will alter medications, encouraged DASH diet, minimize caffeine and obtain adequate sleep. Report concerning symptoms and follow up as directed and as needed. Increase carvedilol   Non-insulin dependent type 2 diabetes mellitus No recent A1C in system, he reports theyare improvin and he is following with Dr Chestine Spore. No changes at present   Tachycardia Increase beta  Blocker and minimize caffeine   Anxiety Tolerating Excitalopram, no changes toay   Right knee DJD Following with orthopaedics, encouraged Tylenol, salon Pas and Curcumen as needed

## 2014-08-22 NOTE — Assessment & Plan Note (Signed)
No recent A1C in system, he reports theyare improvin and he is following with Dr Chestine Sporelark. No changes at present

## 2014-08-22 NOTE — Assessment & Plan Note (Signed)
Increase beta  Blocker and minimize caffeine

## 2014-09-17 ENCOUNTER — Ambulatory Visit (INDEPENDENT_AMBULATORY_CARE_PROVIDER_SITE_OTHER): Payer: 59 | Admitting: Family Medicine

## 2014-09-17 ENCOUNTER — Encounter: Payer: Self-pay | Admitting: Family Medicine

## 2014-09-17 VITALS — BP 114/76 | HR 88 | Temp 97.6°F | Ht 72.0 in | Wt 218.0 lb

## 2014-09-17 DIAGNOSIS — R000 Tachycardia, unspecified: Secondary | ICD-10-CM

## 2014-09-17 DIAGNOSIS — M179 Osteoarthritis of knee, unspecified: Secondary | ICD-10-CM

## 2014-09-17 DIAGNOSIS — M1711 Unilateral primary osteoarthritis, right knee: Secondary | ICD-10-CM

## 2014-09-17 DIAGNOSIS — M25561 Pain in right knee: Secondary | ICD-10-CM

## 2014-09-17 DIAGNOSIS — E119 Type 2 diabetes mellitus without complications: Secondary | ICD-10-CM

## 2014-09-17 DIAGNOSIS — I10 Essential (primary) hypertension: Secondary | ICD-10-CM

## 2014-09-17 DIAGNOSIS — K219 Gastro-esophageal reflux disease without esophagitis: Secondary | ICD-10-CM

## 2014-09-17 MED ORDER — HYDROCODONE-ACETAMINOPHEN 5-325 MG PO TABS: 1.0000 | ORAL_TABLET | Freq: Two times a day (BID) | ORAL | Status: DC | PRN

## 2014-09-17 NOTE — Progress Notes (Signed)
Pre visit review using our clinic review tool, if applicable. No additional management support is needed unless otherwise documented below in the visit note. 

## 2014-09-17 NOTE — Patient Instructions (Signed)
[facility]  Hypertension Hypertension, commonly called high blood pressure, is when the force of blood pumping through your arteries is too strong. Your arteries are the blood vessels that carry blood from your heart throughout your body. A blood pressure reading consists of a higher number over a lower number, such as 110/72. The higher number (systolic) is the pressure inside your arteries when your heart pumps. The lower number (diastolic) is the pressure inside your arteries when your heart relaxes. Ideally you want your blood pressure below 120/80. Hypertension forces your heart to work harder to pump blood. Your arteries may become narrow or stiff. Having hypertension puts you at risk for heart disease, stroke, and other problems.  RISK FACTORS Some risk factors for high blood pressure are controllable. Others are not.  Risk factors you cannot control include:   Race. You may be at higher risk if you are African American.  Age. Risk increases with age.  Gender. Men are at higher risk than women before age 33 years. After age 74, women are at higher risk than men. Risk factors you can control include:  Not getting enough exercise or physical activity.  Being overweight.  Getting too much fat, sugar, calories, or salt in your diet.  Drinking too much alcohol. SIGNS AND SYMPTOMS Hypertension does not usually cause signs or symptoms. Extremely high blood pressure (hypertensive crisis) may cause headache, anxiety, shortness of breath, and nosebleed. DIAGNOSIS  To check if you have hypertension, your health care provider will measure your blood pressure while you are seated, with your arm held at the level of your heart. It should be measured at least twice using the same arm. Certain conditions can cause a difference in blood pressure between your right and left arms. A blood pressure reading that is higher than normal on one occasion does not mean that you need  treatment. If one blood pressure reading is high, ask your health care provider about having it checked again. TREATMENT  Treating high blood pressure includes making lifestyle changes and possibly taking medicine. Living a healthy lifestyle can help lower high blood pressure. You may need to change some of your habits. Lifestyle changes may include:  Following the DASH diet. This diet is high in fruits, vegetables, and whole grains. It is low in salt, red meat, and added sugars.  Getting at least 2 hours of brisk physical activity every week.  Losing weight if necessary.  Not smoking.  Limiting alcoholic beverages.  Learning ways to reduce stress. If lifestyle changes are not enough to get your blood pressure under control, your health care provider may prescribe medicine. You may need to take more than one. Work closely with your health care provider to understand the risks and benefits. HOME CARE INSTRUCTIONS  Have your blood pressure rechecked as directed by your health care provider.   Take medicines only as directed by your health care provider. Follow the directions carefully. Blood pressure medicines must be taken as prescribed. The medicine does not work as well when you skip doses. Skipping doses also puts you at risk for problems.   Do not smoke.   Monitor your blood pressure at home as directed by your health care provider. SEEK MEDICAL CARE IF:   You think you are having a reaction to medicines taken.  You have recurrent headaches or feel dizzy.  You have swelling in your ankles.  You have trouble with your vision. SEEK IMMEDIATE MEDICAL CARE IF:  You  develop a severe headache or confusion.  You have unusual weakness, numbness, or feel faint.  You have severe chest or abdominal pain.  You vomit repeatedly.  You have trouble breathing. MAKE SURE YOU:   Understand these instructions.  Will watch your condition.  Will get help right away if you are  not doing well or get worse. Document Released: 07/23/2005 Document Revised: 12/07/2013 Document Reviewed: 05/15/2013 [facility] Patient Information 2015 [place] [place]. This information is not intended to replace advice given to you by your health care provider. Make sure you discuss any questions you have with your health care provider. , last 1 years worth of notes and labs

## 2014-09-23 ENCOUNTER — Ambulatory Visit (AMBULATORY_SURGERY_CENTER): Payer: Self-pay | Admitting: *Deleted

## 2014-09-23 VITALS — Ht 72.0 in | Wt 215.4 lb

## 2014-09-23 DIAGNOSIS — Z1211 Encounter for screening for malignant neoplasm of colon: Secondary | ICD-10-CM

## 2014-09-23 MED ORDER — MOVIPREP 100 G PO SOLR: 1.0000 | Freq: Once | ORAL | Status: DC

## 2014-09-23 NOTE — Progress Notes (Signed)
No egg or soy allergy No diet pills No home 02 use Pt has hx of post op N/V but no other issues, no issues with intubation Pt declined emmi video

## 2014-09-28 ENCOUNTER — Telehealth: Payer: Self-pay | Admitting: Family Medicine

## 2014-09-28 NOTE — Telephone Encounter (Signed)
Caller name:Tykel Daphine DeutscherMartin Relationship to patient:self Can be [phone]  Reason for call: PT calling in reference to [employer] disability paperwork that was dropped off at last visit 09/17/14- please advise if completed or location.

## 2014-09-29 NOTE — Telephone Encounter (Signed)
Completed paperwork faxed to [employer] at [phone]. JG//CMA

## 2014-09-29 NOTE — Telephone Encounter (Signed)
This must still be with Dr. Abner GreenspanBlyth. I have not encountered this paperwork. JG//CMA

## 2014-09-30 ENCOUNTER — Encounter: Payer: Self-pay | Admitting: Family Medicine

## 2014-09-30 NOTE — Assessment & Plan Note (Signed)
Well controlled, no changes to meds. Encouraged heart healthy diet such as the DASH diet and exercise as tolerated.  °

## 2014-09-30 NOTE — Assessment & Plan Note (Signed)
Avoid offending foods, start probiotics. Do not eat large meals in late evening and consider raising head of bed.  

## 2014-09-30 NOTE — Assessment & Plan Note (Signed)
Improved on recheck.  

## 2014-09-30 NOTE — Progress Notes (Signed)
Sean Horn  [id number] 24-Jun-1961 09/30/2014      Progress Note-Follow Up  Subjective  Chief Complaint  Chief Complaint  Patient presents with  . Follow-up    HPI  Patient is a 54 y.o. male in today for routine medical care. Patient is doing well. Has established with ortho and they have concluded he needs a revision of his knee. He continues to struggle with considerable pain so he is anxious to proceed with surgery. No recent illness, fevers or chills. Denies CP/palp/SOB/HA/congestion/fevers/GI or GU c/o. Taking meds as prescribed  Past Medical History  Diagnosis Date  . PONV (postoperative nausea and vomiting)   . Anxiety   . History of kidney stones   . GERD (gastroesophageal reflux disease)     occasional - OTC as needed  . Non-insulin dependent type 2 diabetes mellitus   . Hypertension     under control with med., had been on med. since age 33  . Osteoarthritis     right knee  . PONV (postoperative nausea and vomiting)   . Tachycardia   . History of chicken pox   . History of shingles     Past Surgical History  Procedure Laterality Date  . Wrist surgery Right     x 3, torn ligaments, pins placed then infected, then fused with plate  . Shoulder arthroscopy w/ rotator cuff repair Left 10/20/2004    x 4, had to have part of clavile removed. torn rotator cuff multiple times, had to place screws   . Knee arthroscopy Left 1980's  . Cholecystectomy  1980's  . 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 Shoulder adhesion release Left ` 1998  . Knee arthroscopy Right 2013  . Lithotripsy      multiple, b/l  . Cystoscopy/retrograde/ureteroscopy/stone extraction with basket Right 08/27/2003    and double J stent placement  . Shoulder arthroscopy w/ superior labral anterior posterior lesion repair Left  12/22/2004  . Knee arthroscopy Right 06/30/2013    Procedure: RIGHT KNEE ARTHROSCOPY AND SYNOVECTOMY;  Surgeon: Velna Ochs, MD;  Location: [facility];  Service: Orthopedics;  Laterality: Right;  . Inguinal hernia repair Left 1990's    Family History  Problem Relation Age of Onset  . COPD Mother     smoker  . Hypertension Mother   . Heart disease Mother     CHF  . Diabetes Father   . Heart disease Father     quintuple bypass  . Stroke Father   . Hypertension Father   . Peripheral vascular disease Father   . Hypertension Son     tachycardia  . Heart disease Maternal Grandfather   . Colon cancer Neg Hx   . Rectal cancer Neg Hx   . Stomach cancer Neg Hx     History   Social History  . Marital Status: Single    Spouse Name: N/A  . Number of Children: N/A  . Years of Education: N/A   Occupational History  . Not on file.   Social History Main Topics  . Smoking status: Never Smoker   . Smokeless tobacco: Current User    Types: Snuff  . Alcohol Use: Yes     Comment: occasionally  . Drug Use: No  . Sexual Activity: Yes  Comment: currently applying for disability due to knee, lives with girfriend, no dietary restrictions, previously Games developer   Other Topics Concern  . Not on file   Social History Narrative    Current Outpatient Prescriptions on File Prior to Visit  Medication Sig Dispense Refill  . Canagliflozin-Metformin HCl (INVOKAMET) [id number] MG TABS Take by mouth 2 (two) times daily.    . carvedilol (COREG) 6.25 MG tablet Take 1 tablet (6.25 mg total) by mouth 2 (two) times daily with a meal. 60 tablet 3  . escitalopram (LEXAPRO) 20 MG tablet Take 20 mg by mouth daily.    Marland Kitchen glipiZIDE (GLUCOTROL) 10 MG tablet Take 10 mg by mouth 2 (two) times daily before a meal.    . lansoprazole (PREVACID) 30 MG capsule Take 30 mg by mouth daily at 12 noon.    Marland Kitchen lisinopril-hydrochlorothiazide (PRINZIDE,ZESTORETIC) 20-12.5 MG per tablet Take 1 tablet  by mouth daily.    Marland Kitchen RA KRILL OIL 500 MG CAPS Take 1 capsule by mouth daily.    . Turmeric 500 MG CAPS Take 1 capsule by mouth 2 (two) times daily.     No current facility-administered medications on file prior to visit.    Allergies  Allergen Reactions  . Toradol [Ketorolac Tromethamine] Itching    Review of Systems  Review of Systems  Constitutional: Negative for fever and malaise/fatigue.  HENT: Negative for congestion.   Eyes: Negative for discharge.  Respiratory: Negative for shortness of breath.   Cardiovascular: Negative for chest pain, palpitations and leg swelling.  Gastrointestinal: Negative for nausea, abdominal pain and diarrhea.  Genitourinary: Negative for dysuria.  Musculoskeletal: Positive for joint pain. Negative for falls.  Skin: Negative for rash.  Neurological: Negative for loss of consciousness and headaches.  Endo/Heme/Allergies: Negative for polydipsia.  Psychiatric/Behavioral: Negative for depression and suicidal ideas. The patient is not nervous/anxious and does not have insomnia.     Objective  BP 114/76 mmHg  Pulse 88  Temp(Src) 97.6 F (36.4 C) (Oral)  Ht 6' (1.829 m)  Wt 218 lb (98.884 kg)  BMI 29.56 kg/m2  SpO2 97%  Physical Exam  Physical Exam  Constitutional: He is oriented to person, place, and time and well-developed, well-nourished, and in no distress. No distress.  HENT:  Head: Normocephalic and atraumatic.  Eyes: Conjunctivae are normal.  Neck: Neck supple. No thyromegaly present.  Cardiovascular: Normal rate, regular rhythm and normal heart sounds.   No murmur heard. Pulmonary/Chest: Effort normal and breath sounds normal. No respiratory distress.  Abdominal: He exhibits no distension and no mass. There is no tenderness.  Musculoskeletal: He exhibits no edema.  Neurological: He is alert and oriented to person, place, and time.  Skin: Skin is warm.  Psychiatric: Memory, affect and judgment normal.    No results found for:  TSH Lab Results  Component Value Date   WBC 9.8 12/25/2012   HGB 12.7* 06/30/2013   HCT 33.8* 12/25/2012   MCV 88.3 12/25/2012   PLT 248 12/25/2012   Lab Results  Component Value Date   CREATININE 1.03 06/26/2013   BUN 12 06/26/2013   NA 134* 06/26/2013   K 4.3 06/26/2013   CL 92* 06/26/2013   CO2 29 06/26/2013   No results found for: ALT, AST, GGT, ALKPHOS, BILITOT No results found for: CHOL No results found for: HDL No results found for: LDLCALC No results found for: TRIG No results found for: CHOLHDL   Assessment & Plan  Hypertension Well controlled, no changes to meds.  Encouraged heart healthy diet such as the DASH diet and exercise as tolerated.    GERD (gastroesophageal reflux disease) Avoid offending foods, start probiotics. Do not eat large meals in late evening and consider raising head of bed.    Tachycardia Improved on recheck   Right knee DJD Is now established with [facility], Dr Andria MeuseStevens. They have confirmed that his pain needs to be addressed with surgery. His spacer is faulty and needs to be replaced. He is anxious to proceed with correction

## 2014-09-30 NOTE — Assessment & Plan Note (Signed)
Is now established with [facility], Dr Andria MeuseStevens. They have confirmed that his pain needs to be addressed with surgery. His spacer is faulty and needs to be replaced. He is anxious to proceed with correction

## 2014-10-07 ENCOUNTER — Encounter: Payer: 59 | Admitting: Gastroenterology

## 2014-10-13 DIAGNOSIS — E119 Type 2 diabetes mellitus without complications: Secondary | ICD-10-CM | POA: Insufficient documentation

## 2014-10-14 ENCOUNTER — Encounter: Payer: Self-pay | Admitting: Family Medicine

## 2014-10-14 MED ORDER — ESCITALOPRAM OXALATE 20 MG PO TABS: 20.0000 mg | ORAL_TABLET | Freq: Every day | ORAL | Status: DC

## 2014-10-14 MED ORDER — GLIPIZIDE 10 MG PO TABS: 10.0000 mg | ORAL_TABLET | Freq: Two times a day (BID) | ORAL | Status: DC

## 2014-10-21 ENCOUNTER — Other Ambulatory Visit: Payer: Self-pay | Admitting: Family Medicine

## 2014-10-22 ENCOUNTER — Other Ambulatory Visit: Payer: Self-pay | Admitting: Family Medicine

## 2014-10-22 ENCOUNTER — Telehealth: Payer: Self-pay | Admitting: Family Medicine

## 2014-10-22 DIAGNOSIS — M25561 Pain in right knee: Secondary | ICD-10-CM

## 2014-10-22 MED ORDER — HYDROCODONE-ACETAMINOPHEN 5-325 MG PO TABS: 1.0000 | ORAL_TABLET | Freq: Two times a day (BID) | ORAL | Status: DC | PRN

## 2014-10-22 MED ORDER — GLIPIZIDE 10 MG PO TABS: 10.0000 mg | ORAL_TABLET | Freq: Two times a day (BID) | ORAL | Status: DC

## 2014-10-22 NOTE — Telephone Encounter (Signed)
Dr Blyth pt 

## 2014-10-22 NOTE — Telephone Encounter (Signed)
[facility] [place] Night - Client TELEPHONE ADVICE RECORD [facility] Patient Name: Sean Horn DOB: 04/14/1961 Initial Comment Caller states [facility]. Rx Norco [id number] was sent by fax and it can not be sent in this way. It has to be hand writen and given to pt or sent electronicly.Just a heads up no call back needed. Nurse Assessment Guidelines Guideline Title Affirmed Question Affirmed Notes Final Disposition User Clinical Call Gaddy, RN, Felicia Comments Thisis a call from pharmacy to send message to office. See note above. They did not want a call back. Will fax this message to the office.

## 2014-10-22 NOTE — Telephone Encounter (Signed)
[place] [facility] [place] per email request.

## 2014-10-24 ENCOUNTER — Other Ambulatory Visit: Payer: Self-pay | Admitting: Family Medicine

## 2014-10-25 ENCOUNTER — Other Ambulatory Visit: Payer: Self-pay | Admitting: *Deleted

## 2014-10-25 NOTE — Telephone Encounter (Signed)
Rx placed at front desk for patient pick-up.  Called patient and left message on voicemail to return call.

## 2014-10-25 NOTE — Telephone Encounter (Signed)
error 

## 2014-10-25 NOTE — Telephone Encounter (Signed)
Called patient and notified that his Rx was at the front desk.  Patient stated understanding.  eal

## 2014-10-28 ENCOUNTER — Telehealth: Payer: Self-pay | Admitting: *Deleted

## 2014-10-28 NOTE — Telephone Encounter (Signed)
Pt dropped off disability form for [employer]. Form filled out as much as possible and signed by Dr. Abner GreenspanBlyth.

## 2014-11-19 ENCOUNTER — Other Ambulatory Visit: Payer: Self-pay | Admitting: Family Medicine

## 2014-12-02 NOTE — Telephone Encounter (Signed)
Original forms faxed on 10/28/2014. Sent for scanning.   Additional forms faxed today to [employer] successfully.  Sent for scanning. JG//CMA

## 2014-12-17 ENCOUNTER — Encounter: Payer: Self-pay | Admitting: Family Medicine

## 2014-12-17 ENCOUNTER — Other Ambulatory Visit: Payer: Self-pay | Admitting: Family Medicine

## 2014-12-17 ENCOUNTER — Ambulatory Visit (INDEPENDENT_AMBULATORY_CARE_PROVIDER_SITE_OTHER): Payer: 59 | Admitting: Family Medicine

## 2014-12-17 VITALS — BP 114/78 | HR 78 | Temp 98.2°F | Ht 72.0 in | Wt 211.4 lb

## 2014-12-17 DIAGNOSIS — K219 Gastro-esophageal reflux disease without esophagitis: Secondary | ICD-10-CM | POA: Diagnosis not present

## 2014-12-17 DIAGNOSIS — E119 Type 2 diabetes mellitus without complications: Secondary | ICD-10-CM

## 2014-12-17 DIAGNOSIS — I10 Essential (primary) hypertension: Secondary | ICD-10-CM

## 2014-12-17 DIAGNOSIS — M1711 Unilateral primary osteoarthritis, right knee: Secondary | ICD-10-CM

## 2014-12-17 DIAGNOSIS — R000 Tachycardia, unspecified: Secondary | ICD-10-CM | POA: Diagnosis not present

## 2014-12-17 DIAGNOSIS — M179 Osteoarthritis of knee, unspecified: Secondary | ICD-10-CM

## 2014-12-17 LAB — HEMOGLOBIN A1C: Hgb A1c MFr Bld: 6.4 % (ref 4.6–6.5)

## 2014-12-17 LAB — COMPREHENSIVE METABOLIC PANEL
ALBUMIN: 4.2 g/dL (ref 3.5–5.2)
ALT: 19 U/L (ref 0–53)
AST: 15 U/L (ref 0–37)
Alkaline Phosphatase: 56 U/L (ref 39–117)
BUN: 12 mg/dL (ref 6–23)
CO2: 30 meq/L (ref 19–32)
CREATININE: 1.08 mg/dL (ref 0.40–1.50)
Calcium: 10.1 mg/dL (ref 8.4–10.5)
Chloride: 98 mEq/L (ref 96–112)
GFR: 75.89 mL/min (ref >60.00)
Glucose, Bld: 196 mg/dL — ABNORMAL HIGH (ref 70–99)
POTASSIUM: 4.6 meq/L (ref 3.5–5.1)
Sodium: 135 mEq/L (ref 135–145)
TOTAL PROTEIN: 7.4 g/dL (ref 6.0–8.3)
Total Bilirubin: 1.2 mg/dL (ref 0.2–1.2)

## 2014-12-17 LAB — LDL CHOLESTEROL, DIRECT: LDL DIRECT: 74 mg/dL

## 2014-12-17 LAB — LIPID PANEL
Cholesterol: 166 mg/dL (ref 0–200)
HDL: 32.6 mg/dL — AB (ref >39.00)
Total CHOL/HDL Ratio: 5
Triglycerides: 508 mg/dL — ABNORMAL HIGH (ref 0.0–149.0)

## 2014-12-17 LAB — CBC
HEMATOCRIT: 46.3 % (ref 39.0–52.0)
Hemoglobin: 16 g/dL (ref 13.0–17.0)
MCHC: 34.6 g/dL (ref 30.0–36.0)
MCV: 91.4 fl (ref 78.0–100.0)
PLATELETS: 308 10*3/uL (ref 150.0–400.0)
RBC: 5.06 Mil/uL (ref 4.22–5.81)
RDW: 12.8 % (ref 11.5–15.5)
WBC: 7.1 10*3/uL (ref 4.0–10.5)

## 2014-12-17 LAB — TSH: TSH: 2.37 u[IU]/mL (ref 0.35–4.50)

## 2014-12-17 MED ORDER — GABAPENTIN 100 MG PO CAPS: ORAL_CAPSULE | ORAL | Status: DC

## 2014-12-17 MED ORDER — OXYCODONE HCL 10 MG PO TABS: 10.0000 mg | ORAL_TABLET | ORAL | Status: DC | PRN

## 2014-12-17 NOTE — Patient Instructions (Signed)

## 2014-12-17 NOTE — Progress Notes (Signed)
Pre visit review using our clinic review tool, if applicable. No additional management support is needed unless otherwise documented below in the visit note. 

## 2014-12-20 ENCOUNTER — Other Ambulatory Visit: Payer: Self-pay | Admitting: Family Medicine

## 2014-12-20 MED ORDER — CHOLINE FENOFIBRATE 135 MG PO CPDR: 135.0000 mg | DELAYED_RELEASE_CAPSULE | Freq: Every day | ORAL | Status: DC

## 2014-12-20 MED ORDER — CANAGLIFLOZIN-METFORMIN HCL 150-1000 MG PO TABS: ORAL_TABLET | ORAL | Status: DC

## 2014-12-28 ENCOUNTER — Encounter: Payer: Self-pay | Admitting: Family Medicine

## 2015-01-02 NOTE — Progress Notes (Signed)
Sean Horn  [id number] 20-Oct-1960 01/02/2015      Progress Note-Follow Up  Subjective  Chief Complaint  Chief Complaint  Patient presents with  . Follow-up    3 month     HPI  Patient is a 54 y.o. male in today for routine medical care. Patient is in today for follow-up been doing fairly well. Has had surgery at [facility] on his knee at this point and does note significant improvement in stability. Does still struggle with some pain and stiffness most notably along the medial aspect. No warmth or redness. Continues to need pain medications however to manage his day. Reports blood sugars are improving. No polyuria or polydipsia. Denies CP/palp/SOB/HA/congestion/fevers/GI or GU c/o. Taking meds as prescribed  Past Medical History  Diagnosis Date  . PONV (postoperative nausea and vomiting)   . Anxiety   . History of kidney stones   . GERD (gastroesophageal reflux disease)     occasional - OTC as needed  . Non-insulin dependent type 2 diabetes mellitus   . Hypertension     under control with med., had been on med. since age 24  . Osteoarthritis     right knee  . PONV (postoperative nausea and vomiting)   . Tachycardia   . History of chicken pox   . History of shingles     Past Surgical History  Procedure Laterality Date  . Wrist surgery Right     x 3, torn ligaments, pins placed then infected, then fused with plate  . Shoulder arthroscopy w/ rotator cuff repair Left 10/20/2004    x 4, had to have part of clavile removed. torn rotator cuff multiple times, had to place screws   . Knee arthroscopy Left 1980's  . Cholecystectomy  1980's  . 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 Shoulder adhesion release Left ` 1998  . Knee arthroscopy Right 2013  . Lithotripsy      multiple, b/l  .  Cystoscopy/retrograde/ureteroscopy/stone extraction with basket Right 08/27/2003    and double J stent placement  . Shoulder arthroscopy w/ superior labral anterior posterior lesion repair Left 12/22/2004  . Knee arthroscopy Right 06/30/2013    Procedure: RIGHT KNEE ARTHROSCOPY AND SYNOVECTOMY;  Surgeon: Hessie Dibble, MD;  Location: [place];  Service: Orthopedics;  Laterality: Right;  . Inguinal hernia repair Left 61's    Family History  Problem Relation Age of Onset  . COPD Mother     smoker  . Hypertension Mother   . Heart disease Mother     CHF  . Diabetes Father   . Heart disease Father     quintuple bypass  . Stroke Father   . Hypertension Father   . Peripheral vascular disease Father   . Hypertension Son     tachycardia  . Heart disease Maternal Grandfather   . Colon cancer Neg Hx   . Rectal cancer Neg Hx   . Stomach cancer Neg Hx     History   Social History  . Marital Status: Single    Spouse Name: N/A  . Number of Children: N/A  . Years of Education: N/A   Occupational History  . Not on file.   Social History Main Topics  . Smoking status: Never Smoker   . Smokeless  tobacco: Current User    Types: Snuff  . Alcohol Use: Yes     Comment: occasionally  . Drug Use: No  . Sexual Activity: Yes     Comment: currently applying for disability due to knee, lives with girfriend, no dietary restrictions, previously Engineer, building services   Other Topics Concern  . Not on file   Social History Narrative    Current Outpatient Prescriptions on File Prior to Visit  Medication Sig Dispense Refill  . carvedilol (COREG) 6.25 MG tablet Take 1 tablet (6.25 mg total) by mouth 2 (two) times daily with a meal. 60 tablet 3  . glipiZIDE (GLUCOTROL) 10 MG tablet Take 1 tablet (10 mg total) by mouth 2 (two) times daily before a meal. 60 tablet 6  . lansoprazole (PREVACID) 30 MG capsule Take 30 mg by mouth daily at 12 noon.    Marland Kitchen lisinopril-hydrochlorothiazide  (PRINZIDE,ZESTORETIC) 20-12.5 MG per tablet Take 1 tablet by mouth daily.    Marland Kitchen RA KRILL OIL 500 MG CAPS Take 1 capsule by mouth daily.    Marland Kitchen MOVIPREP 100 G SOLR Take 1 kit (200 g total) by mouth once. moviprep as directed. No substitutions (Patient not taking: Reported on 12/17/2014) 1 kit 0  . Turmeric 500 MG CAPS Take 1 capsule by mouth 2 (two) times daily.     No current facility-administered medications on file prior to visit.    Allergies  Allergen Reactions  . Toradol [Ketorolac Tromethamine] Itching    Review of Systems  Review of Systems  Constitutional: Negative for fever and malaise/fatigue.  HENT: Negative for congestion.   Eyes: Negative for discharge.  Respiratory: Negative for shortness of breath.   Cardiovascular: Negative for chest pain, palpitations and leg swelling.  Gastrointestinal: Negative for nausea, abdominal pain and diarrhea.  Genitourinary: Negative for dysuria.  Musculoskeletal: Positive for joint pain. Negative for falls.  Skin: Negative for rash.  Neurological: Negative for loss of consciousness and headaches.  Endo/Heme/Allergies: Negative for polydipsia.  Psychiatric/Behavioral: Negative for depression and suicidal ideas. The patient is not nervous/anxious and does not have insomnia.     Objective  BP 114/78 mmHg  Pulse 78  Temp(Src) 98.2 F (36.8 C) (Oral)  Ht 6' (1.829 m)  Wt 211 lb 6.4 oz (95.89 kg)  BMI 28.66 kg/m2  SpO2 98%  Physical Exam  Physical Exam  Constitutional: He is oriented to person, place, and time and well-developed, well-nourished, and in no distress. No distress.  HENT:  Head: Normocephalic and atraumatic.  Eyes: Conjunctivae are normal.  Neck: Neck supple. No thyromegaly present.  Cardiovascular: Normal rate, regular rhythm and normal heart sounds.   No murmur heard. Pulmonary/Chest: Effort normal and breath sounds normal. No respiratory distress.  Abdominal: He exhibits no distension and no mass. There is no  tenderness.  Musculoskeletal: He exhibits no edema.  Neurological: He is alert and oriented to person, place, and time.  Skin: Skin is warm.  Psychiatric: Memory, affect and judgment normal.    Lab Results  Component Value Date   TSH 2.37 12/17/2014   Lab Results  Component Value Date   WBC 7.1 12/17/2014   HGB 16.0 12/17/2014   HCT 46.3 12/17/2014   MCV 91.4 12/17/2014   PLT 308.0 12/17/2014   Lab Results  Component Value Date   CREATININE 1.08 12/17/2014   BUN 12 12/17/2014   NA 135 12/17/2014   K 4.6 12/17/2014   CL 98 12/17/2014   CO2 30 12/17/2014   Lab Results  Component Value Date   ALT 19 12/17/2014   AST 15 12/17/2014   ALKPHOS 56 12/17/2014   BILITOT 1.2 12/17/2014   Lab Results  Component Value Date   CHOL 166 12/17/2014   Lab Results  Component Value Date   HDL 32.60* 12/17/2014   No results found for: [facility] Lab Results  Component Value Date   TRIG * 12/17/2014    508.0 Triglyceride is over 400; calculations on Lipids are invalid.   Lab Results  Component Value Date   CHOLHDL 5 12/17/2014     Assessment & Plan  Hypertension Well controlled, no changes to meds. Encouraged heart healthy diet such as the DASH diet and exercise as tolerated.    GERD (gastroesophageal reflux disease) Avoid offending foods, start probiotics. Do not eat large meals in late evening and consider raising head of bed.    Tachycardia RRR today   Non-insulin dependent type 2 diabetes mellitus Reports improved control, minimize simple carbs   Right knee DJD Knee much more stable since surgery at [facility] but still with pain along medial aspect. May continue pain meds for now and will continue to titrate down

## 2015-01-02 NOTE — Assessment & Plan Note (Signed)
Reports improved control, minimize simple carbs

## 2015-01-02 NOTE — Assessment & Plan Note (Signed)
RRR today 

## 2015-01-02 NOTE — Assessment & Plan Note (Signed)
Knee much [facility] stable [facility] but still with pain along medial aspect. May continue pain meds for now and will continue to titrate down

## 2015-01-02 NOTE — Assessment & Plan Note (Signed)
Well controlled, no changes to meds. Encouraged heart healthy diet such as the DASH diet and exercise as tolerated.  °

## 2015-01-02 NOTE — Assessment & Plan Note (Signed)
Avoid offending foods, start probiotics. Do not eat large meals in late evening and consider raising head of bed.  

## 2015-01-03 ENCOUNTER — Encounter: Payer: Self-pay | Admitting: Family Medicine

## 2015-01-04 ENCOUNTER — Other Ambulatory Visit: Payer: Self-pay | Admitting: Family Medicine

## 2015-01-04 MED ORDER — FENOFIBRATE 160 MG PO TABS: 160.0000 mg | ORAL_TABLET | Freq: Every day | ORAL | Status: DC

## 2015-01-06 ENCOUNTER — Other Ambulatory Visit: Payer: Self-pay | Admitting: Family Medicine

## 2015-01-06 MED ORDER — OXYCODONE HCL 10 MG PO TABS: 10.0000 mg | ORAL_TABLET | ORAL | Status: DC | PRN

## 2015-01-06 NOTE — Telephone Encounter (Signed)
Printed Oxycodone prescription as PCP instructed.  Contacted the patient by mychart to pickup at the front desk at convenience.

## 2015-01-24 ENCOUNTER — Encounter: Payer: Self-pay | Admitting: Family Medicine

## 2015-01-24 ENCOUNTER — Ambulatory Visit (INDEPENDENT_AMBULATORY_CARE_PROVIDER_SITE_OTHER): Payer: 59 | Admitting: Family Medicine

## 2015-01-24 ENCOUNTER — Telehealth: Payer: Self-pay

## 2015-01-24 VITALS — BP 108/64 | HR 116 | Temp 98.6°F | Ht 72.0 in | Wt 211.1 lb

## 2015-01-24 DIAGNOSIS — E119 Type 2 diabetes mellitus without complications: Secondary | ICD-10-CM

## 2015-01-24 DIAGNOSIS — E1169 Type 2 diabetes mellitus with other specified complication: Secondary | ICD-10-CM

## 2015-01-24 DIAGNOSIS — M1711 Unilateral primary osteoarthritis, right knee: Secondary | ICD-10-CM

## 2015-01-24 DIAGNOSIS — M179 Osteoarthritis of knee, unspecified: Secondary | ICD-10-CM

## 2015-01-24 DIAGNOSIS — B379 Candidiasis, unspecified: Secondary | ICD-10-CM

## 2015-01-24 DIAGNOSIS — I10 Essential (primary) hypertension: Secondary | ICD-10-CM | POA: Diagnosis not present

## 2015-01-24 DIAGNOSIS — R000 Tachycardia, unspecified: Secondary | ICD-10-CM

## 2015-01-24 DIAGNOSIS — K219 Gastro-esophageal reflux disease without esophagitis: Secondary | ICD-10-CM | POA: Diagnosis not present

## 2015-01-24 DIAGNOSIS — E669 Obesity, unspecified: Secondary | ICD-10-CM

## 2015-01-24 MED ORDER — NYSTATIN 100000 UNIT/ML MT SUSP: 5.0000 mL | Freq: Three times a day (TID) | OROMUCOSAL | Status: DC

## 2015-01-24 MED ORDER — CARVEDILOL 12.5 MG PO TABS: 12.5000 mg | ORAL_TABLET | Freq: Two times a day (BID) | ORAL | Status: DC

## 2015-01-24 MED ORDER — GABAPENTIN 100 MG PO CAPS: 200.0000 mg | ORAL_CAPSULE | Freq: Three times a day (TID) | ORAL | Status: DC

## 2015-01-24 MED ORDER — LISINOPRIL-HYDROCHLOROTHIAZIDE 10-12.5 MG PO TABS: 1.0000 | ORAL_TABLET | Freq: Every day | ORAL | Status: DC

## 2015-01-24 NOTE — Patient Instructions (Signed)

## 2015-01-24 NOTE — Progress Notes (Signed)
Pre visit review using our clinic review tool, if applicable. No additional management support is needed unless otherwise documented below in the visit note. 

## 2015-01-24 NOTE — Telephone Encounter (Signed)
  Archie Patten, Pharmacist from    [facility] - [place], [place] - [address]   Called for clarification regarding Nystatin.  She wanted to know if Dr. Abner Greenspan wanted patient to take cream or suspension.  Clarified order with Dr. Abner Greenspan.  Dr. Abner Greenspan requested suspension as ordered.  Tonya made aware.

## 2015-01-26 ENCOUNTER — Encounter: Payer: Self-pay | Admitting: Family Medicine

## 2015-01-26 ENCOUNTER — Telehealth: Payer: Self-pay | Admitting: *Deleted

## 2015-01-26 NOTE — Telephone Encounter (Signed)
Received paperwork via fax from [employer] for work restrictions/limitations and RTW date. Also, they are requesting OV notes from December 2015 to present (pt signed a release). Records printed and forms filled out as much as possible. Forwarded to Dr. Abner Greenspan. JG//CMA

## 2015-01-26 NOTE — Telephone Encounter (Signed)
Opened in error

## 2015-01-27 ENCOUNTER — Other Ambulatory Visit: Payer: Self-pay | Admitting: Family Medicine

## 2015-01-27 MED ORDER — FENOFIBRATE 160 MG PO TABS: 160.0000 mg | ORAL_TABLET | Freq: Every day | ORAL | Status: DC

## 2015-01-31 ENCOUNTER — Other Ambulatory Visit: Payer: Self-pay

## 2015-02-03 DIAGNOSIS — B379 Candidiasis, unspecified: Secondary | ICD-10-CM | POA: Insufficient documentation

## 2015-02-03 NOTE — Assessment & Plan Note (Signed)
Well controlled. Will increase Carvedilol increased to manage tachycardia. Encouraged heart healthy diet such as the DASH diet and exercise as tolerated.

## 2015-02-03 NOTE — Assessment & Plan Note (Signed)
rx Nystatin liquid as needed is prescribed

## 2015-02-03 NOTE — Assessment & Plan Note (Signed)
Avoid offending foods, take probiotics. Do not eat large meals in late evening and consider raising head of bed. May use meds prn

## 2015-02-03 NOTE — Assessment & Plan Note (Signed)
Increase Carvedilol to 12.5 mg bid and avoid stimulants.

## 2015-02-03 NOTE — Progress Notes (Signed)
Sean Horn  [id number] 07/23/1961 02/03/2015      Progress Note-Follow Up  Subjective  Chief Complaint  Chief Complaint  Patient presents with  . Diabetes    HPI  Patient is a 54 y.o. male in today for routine medical care. Patient is in today for evaluation of ongoing medical concerns. He continues to struggle with pain, notably in right leg. His knee is painful daily and he has burning radicular pain in leg as well. Has trouble finding a comfortable position. No recent illness but is complaining of sores and irritation in mouth. Continues to follow closely with ortho for his knee. Denies CP/palp/SOB/HA/congestion/fevers/GI or GU c/o. Taking meds as prescribed  Past Medical History  Diagnosis Date  . PONV (postoperative nausea and vomiting)   . Anxiety   . History of kidney stones   . GERD (gastroesophageal reflux disease)     occasional - OTC as needed  . Non-insulin dependent type 2 diabetes mellitus   . Hypertension     under control with med., had been on med. since age 61  . Osteoarthritis     right knee  . PONV (postoperative nausea and vomiting)   . Tachycardia   . History of chicken pox   . History of shingles     Past Surgical History  Procedure Laterality Date  . Wrist surgery Right     x 3, torn ligaments, pins placed then infected, then fused with plate  . Shoulder arthroscopy w/ rotator cuff repair Left 10/20/2004    x 4, had to have part of clavile removed. torn rotator cuff multiple times, had to place screws   . Knee arthroscopy Left 1980's  . Cholecystectomy  1980's  . 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 Shoulder adhesion release Left ` 1998  . Knee arthroscopy Right 2013  . Lithotripsy      multiple, b/l  . Cystoscopy/retrograde/ureteroscopy/stone extraction with basket  Right 08/27/2003    and double J stent placement  . Shoulder arthroscopy w/ superior labral anterior posterior lesion repair Left 12/22/2004  . Knee arthroscopy Right 06/30/2013    Procedure: RIGHT KNEE ARTHROSCOPY AND SYNOVECTOMY;  Surgeon: Hessie Dibble, MD;  Location: [place];  Service: Orthopedics;  Laterality: Right;  . Inguinal hernia repair Left 78's    Family History  Problem Relation Age of Onset  . COPD Mother     smoker  . Hypertension Mother   . Heart disease Mother     CHF  . Diabetes Father   . Heart disease Father     quintuple bypass  . Stroke Father   . Hypertension Father   . Peripheral vascular disease Father   . Hypertension Son     tachycardia  . Heart disease Maternal Grandfather   . Colon cancer Neg Hx   . Rectal cancer Neg Hx   . Stomach cancer Neg Hx     History   Social History  . Marital Status: Single    Spouse Name: N/A  . Number of Children: N/A  . Years of Education: N/A   Occupational History  . Not on file.   Social History Main Topics  . Smoking status: Never Smoker   . Smokeless tobacco: Current User    Types: Snuff  . Alcohol  Use: Yes     Comment: occasionally  . Drug Use: No  . Sexual Activity: Yes     Comment: currently applying for disability due to knee, lives with girfriend, no dietary restrictions, previously Engineer, building services   Other Topics Concern  . Not on file   Social History Narrative    Current Outpatient Prescriptions on File Prior to Visit  Medication Sig Dispense Refill  . Canagliflozin-Metformin HCl (INVOKAMET) [phone] MG TABS Take by mouth two times daily 60 tablet 6  . escitalopram (LEXAPRO) 20 MG tablet TAKE ONE TABLET BY MOUTH ONCE DAILY 30 tablet 5  . glipiZIDE (GLUCOTROL) 10 MG tablet Take 1 tablet (10 mg total) by mouth 2 (two) times daily before a meal. 60 tablet 6  . lansoprazole (PREVACID) 30 MG capsule Take 30 mg by mouth daily at 12 noon.    Marland Kitchen MOVIPREP 100 G SOLR Take 1  kit (200 g total) by mouth once. moviprep as directed. No substitutions 1 kit 0  . Oxycodone HCl 10 MG TABS Take 1-2 tablets (10-20 mg total) by mouth every 4 (four) hours as needed. 90 tablet 0  . RA KRILL OIL 500 MG CAPS Take 1 capsule by mouth daily.    . Turmeric 500 MG CAPS Take 1 capsule by mouth 2 (two) times daily.     No current facility-administered medications on file prior to visit.    Allergies  Allergen Reactions  . Toradol [Ketorolac Tromethamine] Itching    Review of Systems  Review of Systems  Constitutional: Negative for fever and malaise/fatigue.  HENT: Negative for congestion.   Eyes: Negative for discharge.  Respiratory: Negative for shortness of breath.   Cardiovascular: Negative for chest pain, palpitations and leg swelling.  Gastrointestinal: Negative for nausea, abdominal pain and diarrhea.  Genitourinary: Negative for dysuria.  Musculoskeletal: Positive for joint pain. Negative for falls.       Right knee pain   Skin: Negative for rash.  Neurological: Positive for tingling. Negative for loss of consciousness and headaches.       Burning pain noted in right leg  Endo/Heme/Allergies: Negative for polydipsia.  Psychiatric/Behavioral: Negative for depression and suicidal ideas. The patient is not nervous/anxious and does not have insomnia.     Objective  BP 108/64 mmHg  Pulse 116  Temp(Src) 98.6 F (37 C) (Oral)  Ht 6' (1.829 m)  Wt 211 lb 2 oz (95.766 kg)  BMI 28.63 kg/m2  SpO2 97%  Physical Exam  Physical Exam  Constitutional: He is oriented to person, place, and time and well-developed, well-nourished, and in no distress. No distress.  HENT:  Head: Normocephalic and atraumatic.  Eyes: Conjunctivae are normal.  Neck: Neck supple. No thyromegaly present.  Cardiovascular: Normal rate, regular rhythm and normal heart sounds.   No murmur heard. Pulmonary/Chest: Effort normal and breath sounds normal. No respiratory distress.  Abdominal: He  exhibits no distension and no mass. There is no tenderness.  Musculoskeletal: He exhibits no edema.  Neurological: He is alert and oriented to person, place, and time.  Skin: Skin is warm.  Psychiatric: Memory, affect and judgment normal.    Lab Results  Component Value Date   TSH 2.37 12/17/2014   Lab Results  Component Value Date   WBC 7.1 12/17/2014   HGB 16.0 12/17/2014   HCT 46.3 12/17/2014   MCV 91.4 12/17/2014   PLT 308.0 12/17/2014   Lab Results  Component Value Date   CREATININE 1.08 12/17/2014   BUN 12 12/17/2014  NA 135 12/17/2014   K 4.6 12/17/2014   CL 98 12/17/2014   CO2 30 12/17/2014   Lab Results  Component Value Date   ALT 19 12/17/2014   AST 15 12/17/2014   ALKPHOS 56 12/17/2014   BILITOT 1.2 12/17/2014   Lab Results  Component Value Date   CHOL 166 12/17/2014   Lab Results  Component Value Date   HDL 32.60* 12/17/2014   No results found for: [facility] Lab Results  Component Value Date   TRIG * 12/17/2014    508.0 Triglyceride is over 400; calculations on Lipids are invalid.   Lab Results  Component Value Date   CHOLHDL 5 12/17/2014     Assessment & Plan  Hypertension Well controlled. Will increase Carvedilol increased to manage tachycardia. Encouraged heart healthy diet such as the DASH diet and exercise as tolerated.   GERD (gastroesophageal reflux disease) Avoid offending foods, take probiotics. Do not eat large meals in late evening and consider raising head of bed. May use meds prn  Tachycardia Increase Carvedilol to 12.5 mg bid and avoid stimulants.   Non-insulin dependent type 2 diabetes mellitus hgba1c acceptable, minimize simple carbs. Increase exercise as tolerated. 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  Candida infection rx Nystatin liquid as needed is prescribed

## 2015-02-03 NOTE — Assessment & Plan Note (Signed)
With significant neuropathic component to pain, will try increasing Gabapentin to 600 mg tid as tolerated.

## 2015-02-03 NOTE — Assessment & Plan Note (Signed)
hgba1c acceptable, minimize simple carbs. Increase exercise as tolerated. Continue current meds 

## 2015-02-04 NOTE — Telephone Encounter (Signed)
[facility] forms [facility] JG/CMA

## 2015-02-08 ENCOUNTER — Telehealth: Payer: Self-pay | Admitting: Family Medicine

## 2015-02-08 MED ORDER — OXYCODONE HCL 10 MG PO TABS: 10.0000 mg | ORAL_TABLET | ORAL | Status: DC | PRN

## 2015-02-08 NOTE — Telephone Encounter (Signed)
Called the patient informed to pickup hardcopy for Oxycodone at the front desk.

## 2015-02-25 ENCOUNTER — Telehealth: Payer: Self-pay | Admitting: Family Medicine

## 2015-02-25 NOTE — Telephone Encounter (Signed)
Dr. Abner Greenspan please disregard previous message patient no show a nurse visit

## 2015-02-25 NOTE — Telephone Encounter (Signed)
Patient no show 02/24/15 [facility] 05/03/15 - charge or no charge

## 2015-03-21 ENCOUNTER — Other Ambulatory Visit: Payer: Self-pay | Admitting: Family Medicine

## 2015-03-22 ENCOUNTER — Other Ambulatory Visit: Payer: Self-pay | Admitting: Family Medicine

## 2015-03-22 MED ORDER — OXYCODONE HCL 10 MG PO TABS: 10.0000 mg | ORAL_TABLET | ORAL | Status: DC | PRN

## 2015-03-22 NOTE — Telephone Encounter (Signed)
Printed prescription per PCP instructions and patients mychart request.  Patient informed to pickup hardcopy at the front desk.  Emailed to the patient to pickup.

## 2015-04-07 ENCOUNTER — Telehealth: Payer: Self-pay | Admitting: Family Medicine

## 2015-04-07 NOTE — Telephone Encounter (Signed)
Please check with patient and confirm he wants me to speak with them and see what he hopes for if he continues to improve after his last surgery, a return to work? I will call insurance back after that

## 2015-04-07 NOTE — Telephone Encounter (Signed)
Caller name: Dr. Micah Flesher with [employer] on behalf of [employer] - pts Long term benefits carrier Can be reached: [phone]  Reason for call: Read notes from a couple months ago. Wants to know current status and if you feel the same way about work restrictions. Do you see any change for pt in the future to allow him to return to work?

## 2015-04-07 NOTE — Telephone Encounter (Signed)
I spoke to the patient and he states he does not think there is any way he can return to work due to the pain he is in even after the last surgery.  He is ok with PCP speaking to [facility] physician.

## 2015-04-07 NOTE — Telephone Encounter (Signed)
Called the patient left message to call back 

## 2015-04-08 NOTE — Telephone Encounter (Signed)
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

## 2015-04-26 ENCOUNTER — Other Ambulatory Visit: Payer: 59

## 2015-05-03 ENCOUNTER — Encounter: Payer: Self-pay | Admitting: Family Medicine

## 2015-05-03 ENCOUNTER — Ambulatory Visit: Payer: 59 | Admitting: Family Medicine

## 2015-05-05 ENCOUNTER — Other Ambulatory Visit: Payer: Self-pay | Admitting: Family Medicine

## 2015-05-05 DIAGNOSIS — R52 Pain, unspecified: Secondary | ICD-10-CM

## 2015-05-06 ENCOUNTER — Telehealth: Payer: Self-pay | Admitting: Family Medicine

## 2015-05-06 NOTE — Telephone Encounter (Signed)
I received a pt signed ROI via fax this morning from [employer]. They date range they are requesting records are from 01/25/2015 to present and the last time pt was seen in our office was 01/24/15. There are no records to send for requested date range. Faxed them back informing them of this. Date range will need to be changed in order for me to fax over the notes/results from 01/24/15. JG//CMA

## 2015-05-06 NOTE — Telephone Encounter (Signed)
Caller name: Tresa Endo ([employer])   Relationship to patient:   Can be reached: [phone] ext. [phone]   Pharmacy:  Reason for call:  She called in to inquire about a medial record request for the pt. She says that they are working on getting the pt's social security paper work complete for his benefits. The patient has to meet with the courts with this information as soon as possible. She says that they are requesting forms from 01/25/15 - to current .

## 2015-05-23 ENCOUNTER — Other Ambulatory Visit: Payer: Self-pay | Admitting: Family Medicine

## 2015-05-24 ENCOUNTER — Other Ambulatory Visit: Payer: Self-pay | Admitting: Family Medicine

## 2015-05-24 MED ORDER — OXYCODONE HCL 10 MG PO TABS: 10.0000 mg | ORAL_TABLET | ORAL | Status: DC | PRN

## 2015-05-24 NOTE — Telephone Encounter (Signed)
Printed oxycodone per PCP instructions and on counter for signature.  Will contact patient by mychart to pickup hardcopy.

## 2015-05-26 ENCOUNTER — Other Ambulatory Visit (INDEPENDENT_AMBULATORY_CARE_PROVIDER_SITE_OTHER): Payer: 59

## 2015-05-26 DIAGNOSIS — I10 Essential (primary) hypertension: Secondary | ICD-10-CM

## 2015-05-26 DIAGNOSIS — E1169 Type 2 diabetes mellitus with other specified complication: Secondary | ICD-10-CM

## 2015-05-26 DIAGNOSIS — E119 Type 2 diabetes mellitus without complications: Secondary | ICD-10-CM

## 2015-05-26 DIAGNOSIS — B379 Candidiasis, unspecified: Secondary | ICD-10-CM | POA: Diagnosis not present

## 2015-05-26 DIAGNOSIS — E669 Obesity, unspecified: Secondary | ICD-10-CM

## 2015-05-26 LAB — COMPREHENSIVE METABOLIC PANEL
ALBUMIN: 4.5 g/dL (ref 3.5–5.2)
ALK PHOS: 49 U/L (ref 39–117)
ALT: 28 U/L (ref 0–53)
AST: 22 U/L (ref 0–37)
BUN: 13 mg/dL (ref 6–23)
CO2: 29 mEq/L (ref 19–32)
Calcium: 9.6 mg/dL (ref 8.4–10.5)
Chloride: 98 mEq/L (ref 96–112)
Creatinine, Ser: 1.18 mg/dL (ref 0.40–1.50)
GFR: 68.4 mL/min (ref >60.00)
GLUCOSE: 190 mg/dL — AB (ref 70–99)
POTASSIUM: 3.8 meq/L (ref 3.5–5.1)
Sodium: 137 mEq/L (ref 135–145)
TOTAL PROTEIN: 7.4 g/dL (ref 6.0–8.3)
Total Bilirubin: 1.4 mg/dL — ABNORMAL HIGH (ref 0.2–1.2)

## 2015-05-26 LAB — LIPID PANEL
CHOL/HDL RATIO: 5
Cholesterol: 197 mg/dL (ref 0–200)
HDL: 38.2 mg/dL — ABNORMAL LOW (ref >39.00)
NonHDL: 158.53
Triglycerides: 280 mg/dL — ABNORMAL HIGH (ref 0.0–149.0)
VLDL: 56 mg/dL — AB (ref 0.0–40.0)

## 2015-05-26 LAB — CBC
HEMATOCRIT: 49.5 % (ref 39.0–52.0)
HEMOGLOBIN: 16.9 g/dL (ref 13.0–17.0)
MCHC: 34.2 g/dL (ref 30.0–36.0)
MCV: 92.2 fl (ref 78.0–100.0)
Platelets: 280 10*3/uL (ref 150.0–400.0)
RBC: 5.37 Mil/uL (ref 4.22–5.81)
RDW: 13.2 % (ref 11.5–15.5)
WBC: 7.8 10*3/uL (ref 4.0–10.5)

## 2015-05-26 LAB — HEMOGLOBIN A1C: Hgb A1c MFr Bld: 7.3 % — ABNORMAL HIGH (ref 4.6–6.5)

## 2015-05-26 LAB — TSH: TSH: 3.49 u[IU]/mL (ref 0.35–4.50)

## 2015-05-26 LAB — LDL CHOLESTEROL, DIRECT: LDL DIRECT: 132 mg/dL

## 2015-06-02 ENCOUNTER — Encounter: Payer: Self-pay | Admitting: Family Medicine

## 2015-06-02 ENCOUNTER — Ambulatory Visit (INDEPENDENT_AMBULATORY_CARE_PROVIDER_SITE_OTHER): Payer: 59 | Admitting: Family Medicine

## 2015-06-02 VITALS — BP 110/68 | HR 88 | Temp 97.7°F | Ht 72.0 in | Wt 215.0 lb

## 2015-06-02 DIAGNOSIS — Z0000 Encounter for general adult medical examination without abnormal findings: Secondary | ICD-10-CM

## 2015-06-02 DIAGNOSIS — M25579 Pain in unspecified ankle and joints of unspecified foot: Secondary | ICD-10-CM

## 2015-06-02 DIAGNOSIS — K219 Gastro-esophageal reflux disease without esophagitis: Secondary | ICD-10-CM | POA: Diagnosis not present

## 2015-06-02 DIAGNOSIS — E119 Type 2 diabetes mellitus without complications: Secondary | ICD-10-CM | POA: Diagnosis not present

## 2015-06-02 DIAGNOSIS — R000 Tachycardia, unspecified: Secondary | ICD-10-CM

## 2015-06-02 DIAGNOSIS — I10 Essential (primary) hypertension: Secondary | ICD-10-CM

## 2015-06-02 LAB — RHEUMATOID FACTOR

## 2015-06-02 LAB — SEDIMENTATION RATE: SED RATE: 6 mm/h (ref 0–22)

## 2015-06-02 NOTE — Patient Instructions (Addendum)
Salon pas or Aspercreme patches as needed Soak in 1/2 hot water and 1/2 distilled white vinegar then apply Lamisil cream nightly  Basic Carbohydrate Counting for Diabetes Mellitus Carbohydrate counting is a method for keeping track of the amount of carbohydrates you eat. Eating carbohydrates naturally increases the level of sugar (glucose) in your blood, so it is important for you to know the amount that is okay for you to have in every meal. Carbohydrate counting helps keep the level of glucose in your blood within normal limits. The amount of carbohydrates allowed is different for every person. A dietitian can help you calculate the amount that is right for you. Once you know the amount of carbohydrates you can have, you can count the carbohydrates in the foods you want to eat. Carbohydrates are found in the following foods:  Grains, such as breads and cereals.  Dried beans and soy products.  Starchy vegetables, such as potatoes, peas, and corn.  Fruit and fruit juices.  Milk and yogurt.  Sweets and snack foods, such as cake, cookies, candy, chips, soft drinks, and fruit drinks. CARBOHYDRATE COUNTING There are two ways to count the carbohydrates in your food. You can use either of the methods or a combination of both. Reading the "Nutrition Facts" on Packaged Food The "Nutrition Facts" is an area that is included on the labels of almost all packaged food and beverages in the [place]. It includes the serving size of that food or beverage and information about the nutrients in each serving of the food, including the grams (g) of carbohydrate per serving.  Decide the number of servings of this food or beverage that you will be able to eat or drink. Multiply that number of servings by the number of grams of carbohydrate that is listed on the label for that serving. The total will be the amount of carbohydrates you will be having when you eat or drink this food or beverage. Learning  Standard Serving Sizes of Food When you eat food that is not packaged or does not include "Nutrition Facts" on the label, you need to measure the servings in order to count the amount of carbohydrates.A serving of most carbohydrate-rich foods contains about 15 g of carbohydrates. The following list includes serving sizes of carbohydrate-rich foods that provide 15 g ofcarbohydrate per serving:   1 slice of bread (1 oz) or 1 six-inch tortilla.    of a hamburger bun or English muffin.  4-6 crackers.   cup unsweetened dry cereal.    cup hot cereal.   cup rice or pasta.    cup mashed potatoes or  of a large baked potato.  1 cup fresh fruit or one small piece of fruit.    cup canned or frozen fruit or fruit juice.  1 cup milk.   cup plain fat-free yogurt or yogurt sweetened with artificial sweeteners.   cup cooked dried beans or starchy vegetable, such as peas, corn, or potatoes.  Decide the number of standard-size servings that you will eat. Multiply that number of servings by 15 (the grams of carbohydrates in that serving). For example, if you eat 2 cups of strawberries, you will have eaten 2 servings and 30 g of carbohydrates (2 servings x 15 g = 30 g). For foods such as soups and casseroles, in which more than one food is mixed in, you will need to count the carbohydrates in each food that is included. EXAMPLE OF CARBOHYDRATE COUNTING Sample Dinner  3 oz  chicken breast.   cup of brown rice.   cup of corn.  1 cup milk.   1 cup strawberries with sugar-free whipped topping.  Carbohydrate Calculation Step 1: Identify the foods that contain carbohydrates:   Rice.   Corn.   Milk.   Strawberries. Step 2:Calculate the number of servings eaten of each:   2 servings of rice.   1 serving of corn.   1 serving of milk.   1 serving of strawberries. Step 3: Multiply each of those number of servings by 15 g:   2 servings of rice x 15 g = 30 g.    1 serving of corn x 15 g = 15 g.   1 serving of milk x 15 g = 15 g.   1 serving of strawberries x 15 g = 15 g. Step 4: Add together all of the amounts to find the total grams of carbohydrates eaten: 30 g + 15 g + 15 g + 15 g = 75 g.   This information is not intended to replace advice given to you by your health care provider. Make sure you discuss any questions you have with your health care provider.   Document Released: 07/23/2005 Document Revised: 08/13/2014 Document Reviewed: 06/19/2013 Elsevier Interactive Patient Education [employer]

## 2015-06-02 NOTE — Progress Notes (Signed)
Pre visit review using our clinic review tool, if applicable. No additional management support is needed unless otherwise documented below in the visit note. 

## 2015-06-09 ENCOUNTER — Encounter: Payer: Self-pay | Admitting: Family Medicine

## 2015-06-12 ENCOUNTER — Encounter: Payer: Self-pay | Admitting: Family Medicine

## 2015-06-12 NOTE — Assessment & Plan Note (Signed)
Set up to perform Cologuard

## 2015-06-12 NOTE — Assessment & Plan Note (Signed)
Well controlled, no changes to meds. Encouraged heart healthy diet such as the DASH diet and exercise as tolerated.  °

## 2015-06-12 NOTE — Assessment & Plan Note (Signed)
RRR today 

## 2015-06-12 NOTE — Progress Notes (Signed)
Subjective:    Patient ID: Sean Horn, male    DOB: 06/18/1961, 54 y.o.   MRN: [id number]  Chief Complaint  Patient presents with  . Follow-up    HPI Patient is in today for follow-up. He is continuing to struggle with knee pain but it is slowly improved. There is more stability. Unfortunately he does have daily pain in his knees as well as his hands shoulders and wrists. No new falls. He's had increased pain in his right shoulder for 2 months. No acute concerns or recent illness. Denies CP/palp/SOB/HA/congestion/fevers/GI or GU c/o. Taking meds as prescribed  Past Medical History  Diagnosis Date  . PONV (postoperative nausea and vomiting)   . Anxiety   . History of kidney stones   . GERD (gastroesophageal reflux disease)     occasional - OTC as needed  . Non-insulin dependent type 2 diabetes mellitus ([place])   . Hypertension     under control with med., had been on med. since age 6  . Osteoarthritis     right knee  . PONV (postoperative nausea and vomiting)   . Tachycardia   . History of chicken pox   . History of shingles   . Preventative health care 08/14/2013    Past Surgical History  Procedure Laterality Date  . Wrist surgery Right     x 3, torn ligaments, pins placed then infected, then fused with plate  . Shoulder arthroscopy w/ rotator cuff repair Left 10/20/2004    x 4, had to have part of clavile removed. torn rotator cuff multiple times, had to place screws   . Knee arthroscopy Left 1980's  . Cholecystectomy  1980's  . 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 Shoulder adhesion release Left ` 1998  . Knee arthroscopy Right 2013  . Lithotripsy      multiple, b/l  . Cystoscopy/retrograde/ureteroscopy/stone extraction with basket Right 08/27/2003    and double J stent placement  . Shoulder  arthroscopy w/ superior labral anterior posterior lesion repair Left 12/22/2004  . Knee arthroscopy Right 06/30/2013    Procedure: RIGHT KNEE ARTHROSCOPY AND SYNOVECTOMY;  Surgeon: Hessie Dibble, MD;  Location: [facility];  Service: Orthopedics;  Laterality: Right;  . Inguinal hernia repair Left 66's    Family History  Problem Relation Age of Onset  . COPD Mother     smoker  . Hypertension Mother   . Heart disease Mother     CHF  . Diabetes Father   . Heart disease Father     quintuple bypass  . Stroke Father   . Hypertension Father   . Peripheral vascular disease Father   . Hypertension Son     tachycardia  . Heart disease Maternal Grandfather   . Colon cancer Neg Hx   . Rectal cancer Neg Hx   . Stomach cancer Neg Hx   . Arthritis Paternal Grandmother     Social History   Social History  . Marital Status: Single    Spouse Name: N/A  . Number of Children: N/A  . Years of Education: N/A   Occupational History  . Not on file.   Social History Main Topics  . Smoking status: Never Smoker   . Smokeless tobacco: Current User    Types: Snuff  . Alcohol  Use: Yes     Comment: occasionally  . Drug Use: No  . Sexual Activity: Yes     Comment: currently applying for disability due to knee, lives with girfriend, no dietary restrictions, previously Engineer, building services   Other Topics Concern  . Not on file   Social History Narrative    Outpatient Prescriptions Prior to Visit  Medication Sig Dispense Refill  . Canagliflozin-Metformin HCl (INVOKAMET) [phone] MG TABS Take by mouth two times daily 60 tablet 6  . carvedilol (COREG) 12.5 MG tablet Take 1 tablet (12.5 mg total) by mouth 2 (two) times daily with a meal. 60 tablet 3  . escitalopram (LEXAPRO) 20 MG tablet TAKE ONE TABLET BY MOUTH ONCE DAILY 30 tablet 5  . fenofibrate 160 MG tablet Take 1 tablet (160 mg total) by mouth daily. 30 tablet 8  . gabapentin (NEURONTIN) 100 MG capsule Take 2 capsules (200  mg total) by mouth 3 (three) times daily. 1805 capsule 1  . glipiZIDE (GLUCOTROL) 10 MG tablet Take 1 tablet (10 mg total) by mouth 2 (two) times daily before a meal. 60 tablet 6  . lansoprazole (PREVACID) 30 MG capsule Take 30 mg by mouth daily at 12 noon.    Marland Kitchen lisinopril-hydrochlorothiazide (PRINZIDE,ZESTORETIC) 10-12.5 MG per tablet Take 1 tablet by mouth daily. 30 tablet 5  . nystatin (MYCOSTATIN) 100000 UNIT/ML suspension Take 5 mLs (500,000 Units total) by mouth 3 (three) times daily. Apply to affected area 120 mL 1  . Oxycodone HCl 10 MG TABS Take 1-2 tablets (10-20 mg total) by mouth every 4 (four) hours as needed. 90 tablet 0  . RA KRILL OIL 500 MG CAPS Take 1 capsule by mouth daily.    . Turmeric 500 MG CAPS Take 1 capsule by mouth 2 (two) times daily.    Marland Kitchen MOVIPREP 100 G SOLR Take 1 kit (200 g total) by mouth once. moviprep as directed. No substitutions 1 kit 0   No facility-administered medications prior to visit.    Allergies  Allergen Reactions  . Toradol [Ketorolac Tromethamine] Itching    Review of Systems  Constitutional: Negative for fever and malaise/fatigue.  HENT: Negative for congestion.   Eyes: Negative for discharge.  Respiratory: Negative for shortness of breath.   Cardiovascular: Negative for chest pain, palpitations and leg swelling.  Gastrointestinal: Negative for nausea and abdominal pain.  Genitourinary: Negative for dysuria.  Musculoskeletal: Positive for back pain and joint pain. Negative for falls.  Skin: Negative for rash.  Neurological: Negative for loss of consciousness and headaches.  Endo/Heme/Allergies: Negative for environmental allergies.  Psychiatric/Behavioral: Negative for depression. The patient is not nervous/anxious.        Objective:    Physical Exam  Constitutional: He is oriented to person, place, and time. He appears well-developed and well-nourished. No distress.  HENT:  Head: Normocephalic and atraumatic.  Nose: Nose normal.   Eyes: Right eye exhibits no discharge. Left eye exhibits no discharge.  Neck: Normal range of motion. Neck supple.  Cardiovascular: Normal rate and regular rhythm.   No murmur heard. Pulmonary/Chest: Effort normal and breath sounds normal.  Abdominal: Soft. Bowel sounds are normal. There is no tenderness.  Musculoskeletal: He exhibits no edema.  Neurological: He is alert and oriented to person, place, and time.  Skin: Skin is warm and dry.  Psychiatric: He has a normal mood and affect.  Nursing note and vitals reviewed.   BP 110/68 mmHg  Pulse 88  Temp(Src) 97.7 F (36.5 C) (Oral)  Ht  6' (1.829 m)  Wt 215 lb (97.523 kg)  BMI 29.15 kg/m2  SpO2 96% Wt Readings from Last 3 Encounters:  06/02/15 215 lb (97.523 kg)  01/24/15 211 lb 2 oz (95.766 kg)  12/17/14 211 lb 6.4 oz (95.89 kg)     Lab Results  Component Value Date   WBC 7.8 05/26/2015   HGB 16.9 05/26/2015   HCT 49.5 05/26/2015   PLT 280.0 05/26/2015   GLUCOSE 190* 05/26/2015   CHOL 197 05/26/2015   TRIG 280.0* 05/26/2015   HDL 38.20* 05/26/2015   LDLDIRECT 132.0 05/26/2015   ALT 28 05/26/2015   AST 22 05/26/2015   NA 137 05/26/2015   K 3.8 05/26/2015   CL 98 05/26/2015   CREATININE 1.18 05/26/2015   BUN 13 05/26/2015   CO2 29 05/26/2015   TSH 3.49 05/26/2015   INR 1.03 12/16/2012   HGBA1C 7.3* 05/26/2015    Lab Results  Component Value Date   TSH 3.49 05/26/2015   Lab Results  Component Value Date   WBC 7.8 05/26/2015   HGB 16.9 05/26/2015   HCT 49.5 05/26/2015   MCV 92.2 05/26/2015   PLT 280.0 05/26/2015   Lab Results  Component Value Date   NA 137 05/26/2015   K 3.8 05/26/2015   CO2 29 05/26/2015   GLUCOSE 190* 05/26/2015   BUN 13 05/26/2015   CREATININE 1.18 05/26/2015   BILITOT 1.4* 05/26/2015   ALKPHOS 49 05/26/2015   AST 22 05/26/2015   ALT 28 05/26/2015   PROT 7.4 05/26/2015   ALBUMIN 4.5 05/26/2015   CALCIUM 9.6 05/26/2015   GFR 68.40 05/26/2015   Lab Results  Component  Value Date   CHOL 197 05/26/2015   Lab Results  Component Value Date   HDL 38.20* 05/26/2015   No results found for: [facility] Lab Results  Component Value Date   TRIG 280.0* 05/26/2015   Lab Results  Component Value Date   CHOLHDL 5 05/26/2015   Lab Results  Component Value Date   HGBA1C 7.3* 05/26/2015       Assessment & Plan:   Problem List Items Addressed This Visit    Tachycardia    RRR today      Relevant Orders   TSH   CBC   Hemoglobin A1c   Microalbumin / creatinine urine ratio   Lipid panel   Comprehensive metabolic panel   Preventative health care    Set up to perform Cologuard      Non-insulin dependent type 2 diabetes mellitus (H[facility])    minimize simple carbs. Increase exercise as tolerated. Continue current meds      Relevant Orders   TSH   CBC   Hemoglobin A1c   Microalbumin / creatinine urine ratio   Lipid panel   Comprehensive metabolic panel   Hypertension    Well controlled, no changes to meds. Encouraged heart healthy diet such as the DASH diet and exercise as tolerated.       Relevant Orders   TSH   CBC   Hemoglobin A1c   Microalbumin / creatinine urine ratio   Lipid panel   Comprehensive metabolic panel   GERD (gastroesophageal reflux disease)    Avoid offending foods, start probiotics. Do not eat large meals in late evening and consider raising head of bed.       Relevant Orders   TSH   CBC   Hemoglobin A1c   Microalbumin / creatinine urine ratio   Lipid panel   Comprehensive metabolic  panel    Other Visit Diagnoses    Pain in joint, ankle and foot, unspecified laterality    -  Primary    Relevant Orders    Rheumatoid Factor (Completed)    Sed Rate (ESR) (Completed)    TSH    CBC    Hemoglobin A1c    Microalbumin / creatinine urine ratio    Lipid panel    Comprehensive metabolic panel       I have discontinued Mr. Bradly MOVIPREP. I am also having him maintain his RA KRILL OIL, lansoprazole, Turmeric,  glipiZIDE, escitalopram, Canagliflozin-Metformin HCl, gabapentin, nystatin, carvedilol, lisinopril-hydrochlorothiazide, fenofibrate, and Oxycodone HCl.  No orders of the defined types were placed in this encounter.     Penni Homans, MD

## 2015-06-12 NOTE — Assessment & Plan Note (Signed)
minimize simple carbs. Increase exercise as tolerated. Continue current meds  

## 2015-06-12 NOTE — Assessment & Plan Note (Signed)
Avoid offending foods, start probiotics. Do not eat large meals in late evening and consider raising head of bed.  

## 2015-06-17 LAB — FECAL OCCULT BLOOD, GUAIAC: Fecal Occult Blood: NEGATIVE

## 2015-06-17 LAB — COLOGUARD: Cologuard: NEGATIVE

## 2015-06-21 ENCOUNTER — Telehealth: Payer: Self-pay | Admitting: Family Medicine

## 2015-06-21 ENCOUNTER — Encounter: Payer: Self-pay | Admitting: Family Medicine

## 2015-06-21 NOTE — Telephone Encounter (Signed)
Abstracted cologuard results (Negative). Called the patient to inform, mailed a copy of result and sent original to scan.

## 2015-06-22 ENCOUNTER — Other Ambulatory Visit: Payer: Self-pay | Admitting: Family Medicine

## 2015-06-27 ENCOUNTER — Other Ambulatory Visit: Payer: Self-pay | Admitting: Family Medicine

## 2015-06-28 ENCOUNTER — Other Ambulatory Visit: Payer: Self-pay | Admitting: Family Medicine

## 2015-06-28 MED ORDER — OXYCODONE HCL 10 MG PO TABS: 10.0000 mg | ORAL_TABLET | ORAL | Status: DC | PRN

## 2015-07-25 ENCOUNTER — Telehealth: Payer: Self-pay | Admitting: *Deleted

## 2015-07-25 NOTE — Telephone Encounter (Signed)
[facility] Filled out as much as possible and forwarded to Dr. Abner GreenspanBlyth. JG//CMA

## 2015-07-26 NOTE — Telephone Encounter (Signed)
Completed forms faxed to [employer] at [phone] successfully. Sent for scanning. JG//CMA

## 2015-07-26 NOTE — Telephone Encounter (Signed)
Pt returning call. Best call back # [phone].

## 2015-07-26 NOTE — Telephone Encounter (Signed)
Patient returned my call and did answer questions for form per PCP instructions.  Questions answered and given to PCP.

## 2015-07-27 ENCOUNTER — Other Ambulatory Visit: Payer: Self-pay | Admitting: Family Medicine

## 2015-07-28 ENCOUNTER — Other Ambulatory Visit: Payer: Self-pay | Admitting: Family Medicine

## 2015-07-28 MED ORDER — CARVEDILOL 12.5 MG PO TABS: 12.5000 mg | ORAL_TABLET | Freq: Two times a day (BID) | ORAL | Status: DC

## 2015-07-28 MED ORDER — OXYCODONE HCL 10 MG PO TABS: 10.0000 mg | ORAL_TABLET | ORAL | Status: DC | PRN

## 2015-07-28 NOTE — Telephone Encounter (Signed)
Called the patient informed of instructions regarding refill and can pickup hardcopy at convenience

## 2015-07-28 NOTE — Telephone Encounter (Signed)
I have plans to fill only #[phone] since I have never seen pt before and no established relationship. Covering for Dr. Abner GreenspanBlyth.

## 2015-07-28 NOTE — Telephone Encounter (Signed)
Requesting:   OXYCODONE Contract   12/17/2014 UDS    NONE Last OV    06/02/2015 Last Refill   #90 WITH 0 REFILLS ON 06/28/2015  Please Advise

## 2015-08-03 ENCOUNTER — Encounter: Payer: Self-pay | Admitting: Physical Medicine & Rehabilitation

## 2015-08-10 ENCOUNTER — Encounter: Payer: 59 | Attending: Physical Medicine & Rehabilitation | Admitting: Physical Medicine & Rehabilitation

## 2015-08-10 ENCOUNTER — Encounter: Payer: Self-pay | Admitting: Physical Medicine & Rehabilitation

## 2015-08-10 VITALS — BP 126/83 | HR 86 | Resp 14

## 2015-08-10 DIAGNOSIS — I10 Essential (primary) hypertension: Secondary | ICD-10-CM | POA: Insufficient documentation

## 2015-08-10 DIAGNOSIS — F419 Anxiety disorder, unspecified: Secondary | ICD-10-CM | POA: Insufficient documentation

## 2015-08-10 DIAGNOSIS — F17290 Nicotine dependence, other tobacco product, uncomplicated: Secondary | ICD-10-CM | POA: Insufficient documentation

## 2015-08-10 DIAGNOSIS — Z96651 Presence of right artificial knee joint: Secondary | ICD-10-CM | POA: Diagnosis not present

## 2015-08-10 DIAGNOSIS — K219 Gastro-esophageal reflux disease without esophagitis: Secondary | ICD-10-CM | POA: Diagnosis not present

## 2015-08-10 DIAGNOSIS — M179 Osteoarthritis of knee, unspecified: Secondary | ICD-10-CM | POA: Diagnosis present

## 2015-08-10 DIAGNOSIS — M25561 Pain in right knee: Secondary | ICD-10-CM | POA: Insufficient documentation

## 2015-08-10 DIAGNOSIS — E119 Type 2 diabetes mellitus without complications: Secondary | ICD-10-CM | POA: Diagnosis not present

## 2015-08-10 DIAGNOSIS — F329 Major depressive disorder, single episode, unspecified: Secondary | ICD-10-CM | POA: Insufficient documentation

## 2015-08-10 DIAGNOSIS — M1711 Unilateral primary osteoarthritis, right knee: Secondary | ICD-10-CM | POA: Diagnosis not present

## 2015-08-10 DIAGNOSIS — Z87442 Personal history of urinary calculi: Secondary | ICD-10-CM | POA: Insufficient documentation

## 2015-08-10 NOTE — Progress Notes (Signed)
Subjective:    Patient ID: Sean Horn, male    DOB: March 21, 1961, 55 y.o.   MRN: [id number]  HPI 55 y/o with hx of anxiety, fairly well controlled non-insulin dependent DM, OA of right knee and psh of TKA x2 in right knee presents with pain in his right knee.  This was diagnosed by Ortho ~4 with OA and most revision was ~1.5 years.  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  He is currently on disability.  Pain meds improve the pain (Oxycodone 10mg  ~2-3/day), ice (helps), Voltaren gel (no real benefit), heat (with occasional benefits).  Prolonged postures, bending, squatting, steps exacerbate the pain.  Dull, achy, constant pain.  It occasionally radiates to his right hip.  Today severity is 8/10 (he did not take any pain meds this AM).  The pain is interfering with his ability to work and enjoy activities, such as golf as well as his sleep.     Pain Inventory Average Pain 7 Pain Right Now 7 My pain is constant, dull and aching  In the last 24 hours, has pain interfered with the following? General activity 7 Relation with others 4 Enjoyment of life 5 What TIME of day is your pain at its worst? morning, evening, night Sleep (in general) Poor  Pain is worse with: walking, bending, sitting and standing Pain improves with: rest, heat/ice and medication Relief from Meds: 4  Mobility walk with assistance use a cane how many minutes can you walk? 10 ability to climb steps?  no do you drive?  yes Do you have any goals in this area?  yes  Function not employed: date last employed .  Neuro/Psych trouble walking depression anxiety  Prior Studies bone scan x-rays new visit  Physicians involved in your care Primary care Dr. Abner Greenspan Orthopedist Dr. Rudie Meyer New Visit   Family History  Problem Relation Age of Onset  . COPD Mother     smoker  . Hypertension Mother   . Heart disease Mother     CHF  . Diabetes Father   . Heart disease Father    quintuple bypass  . Stroke Father   . Hypertension Father   . Peripheral vascular disease Father   . Hypertension Son     tachycardia  . Heart disease Maternal Grandfather   . Colon cancer Neg Hx   . Rectal cancer Neg Hx   . Stomach cancer Neg Hx   . Arthritis Paternal Grandmother    Social History   Social History  . Marital Status: Single    Spouse Name: N/A  . Number of Children: N/A  . Years of Education: N/A   Social History Main Topics  . Smoking status: Never Smoker   . Smokeless tobacco: Current User    Types: Snuff  . Alcohol Use: Yes     Comment: occasionally  . Drug Use: No  . Sexual Activity: Yes     Comment: currently applying for disability due to knee, lives with girfriend, no dietary restrictions, previously Games developer   Other Topics Concern  . None   Social History Narrative   Past Surgical History  Procedure Laterality Date  . Wrist surgery Right     x 3, torn ligaments, pins placed then infected, then fused with plate  . Shoulder arthroscopy w/ rotator cuff repair Left 10/20/2004    x 4, had to have part of clavile removed. torn rotator cuff multiple times, had to place  screws   . Knee arthroscopy Left 1980's  . Cholecystectomy  1980's  . 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 Shoulder adhesion release Left ` 1998  . Knee arthroscopy Right 2013  . Lithotripsy      multiple, b/l  . Cystoscopy/retrograde/ureteroscopy/stone extraction with basket Right 08/27/2003    and double J stent placement  . Shoulder arthroscopy w/ superior labral anterior posterior lesion repair Left 12/22/2004  . Knee arthroscopy Right 06/30/2013    Procedure: RIGHT KNEE ARTHROSCOPY AND SYNOVECTOMY;  Surgeon: Velna Ochs, MD;  Location: [facility];  Service: Orthopedics;  Laterality:  Right;  . Inguinal hernia repair Left 1990's   Past Medical History  Diagnosis Date  . PONV (postoperative nausea and vomiting)   . Anxiety   . History of kidney stones   . GERD (gastroesophageal reflux disease)     occasional - OTC as needed  . Non-insulin dependent type 2 diabetes mellitus ([facility])   . Hypertension     under control with med., had been on med. since age 62  . Osteoarthritis     right knee  . PONV (postoperative nausea and vomiting)   . Tachycardia   . History of chicken pox   . History of shingles   . Preventative health care 08/14/2013   BP 126/83 mmHg  Pulse 86  Resp 14  SpO2 95%  Opioid Risk Score:   Fall Risk Score:  `1  Depression screen PHQ 2/9  Depression screen PHQ 2/9 08/10/2015  Decreased Interest 2  Down, Depressed, Hopeless 3  PHQ - 2 Score 5  Altered sleeping 3  Tired, decreased energy 2  Change in appetite 3  Feeling bad or failure about yourself  3  Trouble concentrating 1  Moving slowly or fidgety/restless 3  Suicidal thoughts 0  PHQ-9 Score 20  Difficult doing work/chores Very difficult   Current Outpatient Prescriptions on File Prior to Visit  Medication Sig Dispense Refill  . Canagliflozin-Metformin HCl (INVOKAMET) [phone] MG TABS Take by mouth two times daily 60 tablet 6  . carvedilol (COREG) 12.5 MG tablet Take 1 tablet (12.5 mg total) by mouth 2 (two) times daily with a meal. 60 tablet 6  . escitalopram (LEXAPRO) 20 MG tablet TAKE ONE TABLET BY MOUTH ONCE DAILY 30 tablet 5  . fenofibrate 160 MG tablet Take 1 tablet (160 mg total) by mouth daily. 30 tablet 8  . gabapentin (NEURONTIN) 100 MG capsule TAKE TWO CAPSULES BY MOUTH THREE TIMES DAILY 180 capsule 0  . glipiZIDE (GLUCOTROL) 10 MG tablet Take 1 tablet (10 mg total) by mouth 2 (two) times daily before a meal. 60 tablet 6  . lansoprazole (PREVACID) 30 MG capsule Take 30 mg by mouth daily at 12 noon.    Marland Kitchen lisinopril-hydrochlorothiazide (PRINZIDE,ZESTORETIC) 10-12.5 MG per  tablet Take 1 tablet by mouth daily. 30 tablet 5  . nystatin (MYCOSTATIN) 100000 UNIT/ML suspension Take 5 mLs (500,000 Units total) by mouth 3 (three) times daily. Apply to affected area 120 mL 1  . Oxycodone HCl 10 MG TABS Take 1-2 tablets (10-20 mg total) by mouth every 4 (four) hours as needed. 45 tablet 0  . RA KRILL OIL 500 MG CAPS Take 1 capsule by mouth daily.    . Turmeric 500 MG CAPS Take 1 capsule  by mouth 2 (two) times daily.     No current facility-administered medications on file prior to visit.    Review of Systems  Constitutional: Negative.   Musculoskeletal: Positive for myalgias, joint swelling, arthralgias and gait problem. Negative for back pain and neck pain.  Neurological: Negative.   All other systems reviewed and are negative.     Objective:   Physical Exam HENT: Normocephalic, Atraumatic Eyes: EOMI, Conj WNL Cardio: S1, S2 normal, RRR Pulm: B/l clear to auscultation.  Effort normal Abd: Soft, non-distended, non-tender, BS+ MSK: Gait Antalgic.  Mild TTP right knee.  Neg TTP b/l greater trochanter.  Neg TTP lower back. No atrophy.  No edema.   Neg FABERs b/l for hip and SI Neuro: CN II-XII grossly intact.    Sensation intact to light touch in all LE dermatomes  Reflexes 2+ throughout LE, except 1+ right knee  Strength  5/5 in all LE myotomes Skin: Warm and Dry.  Healer right knee surgical scar.     Assessment & Plan:  55 y/o with hx of anxiety, fairly well controlled non-insulin dependent DM, OA of right knee and psh of TKA x2 in right knee presents with pain in his right knee.  1. Right knee pain  S/p arthroscopy x2, TKA, and TKA revision (most recently ~May 2015)  Major complaint is lack of sleep due to pain and stiffness.  Pt has had PT after his most recent revision, but states he plateaued.  He continues some of the home exercise programs.   Pt has tried braces without significant benefit.    TENS unit does not provide relief.    Recommended  aquatic therapy.  Pt has a local [employer] he can become a member of.    Oxycodone from PCP ~2/days helps with the pain, cont per PCP  Cont ice  Will inquire about CPM machine trial for pt as well as polar ice  Will consider increasing Gabapentin  qHS dose  Will consider Lidoderm patch in future due to significant effectiveness of ice  Will consider prescription for cain  If ineffective, will also consider nerve block.

## 2015-08-31 ENCOUNTER — Other Ambulatory Visit: Payer: Self-pay | Admitting: Medical

## 2015-09-01 ENCOUNTER — Encounter: Payer: Self-pay | Admitting: Family Medicine

## 2015-09-01 MED ORDER — OXYCODONE HCL 10 MG PO TABS: 10.0000 mg | ORAL_TABLET | ORAL | Status: DC | PRN

## 2015-09-01 NOTE — Progress Notes (Unsigned)
Called patient,left message that prescription( Oxycodone) will be at front desk for pick up.

## 2015-09-05 ENCOUNTER — Encounter: Payer: Self-pay | Admitting: Family Medicine

## 2015-09-05 ENCOUNTER — Other Ambulatory Visit: Payer: 59

## 2015-09-05 ENCOUNTER — Other Ambulatory Visit: Payer: Self-pay | Admitting: Family Medicine

## 2015-09-05 DIAGNOSIS — R1084 Generalized abdominal pain: Secondary | ICD-10-CM

## 2015-09-05 MED ORDER — OXYCODONE HCL 10 MG PO TABS: 10.0000 mg | ORAL_TABLET | ORAL | Status: DC | PRN

## 2015-09-07 ENCOUNTER — Encounter: Payer: 59 | Admitting: Physical Medicine & Rehabilitation

## 2015-09-12 ENCOUNTER — Ambulatory Visit: Payer: 59 | Admitting: Family Medicine

## 2015-09-20 ENCOUNTER — Ambulatory Visit (INDEPENDENT_AMBULATORY_CARE_PROVIDER_SITE_OTHER): Payer: Self-pay | Admitting: Family Medicine

## 2015-09-20 ENCOUNTER — Encounter: Payer: Self-pay | Admitting: Family Medicine

## 2015-09-20 VITALS — BP 120/84 | HR 99 | Temp 98.2°F | Ht 72.0 in | Wt 217.5 lb

## 2015-09-20 DIAGNOSIS — I10 Essential (primary) hypertension: Secondary | ICD-10-CM

## 2015-09-20 DIAGNOSIS — E119 Type 2 diabetes mellitus without complications: Secondary | ICD-10-CM

## 2015-09-20 DIAGNOSIS — R000 Tachycardia, unspecified: Secondary | ICD-10-CM

## 2015-09-20 MED ORDER — INSULIN LISPRO 100 UNIT/ML ~~LOC~~ SOLN: 2.0000 [IU] | Freq: Three times a day (TID) | SUBCUTANEOUS | Status: DC

## 2015-09-20 MED ORDER — CANAGLIFLOZIN 300 MG PO TABS: 300.0000 mg | ORAL_TABLET | Freq: Every day | ORAL | Status: DC

## 2015-09-20 MED ORDER — METFORMIN HCL 1000 MG PO TABS: 1000.0000 mg | ORAL_TABLET | Freq: Two times a day (BID) | ORAL | Status: DC

## 2015-09-20 NOTE — Progress Notes (Signed)
Pre visit review using our clinic review tool, if applicable. No additional management support is needed unless otherwise documented below in the visit note. 

## 2015-09-20 NOTE — Patient Instructions (Addendum)
Sean Horn patient assistance or Sean Horn or Sean Horn  Once you figure out the med you can get assistance with let us know and we will proceed  No more sodas  Basic Carbohydrate Counting for Diabetes Mellitus Carbohydrate counting is a method for keeping track of the amount of carbohydrates you eat. Eating carbohydrates naturally increases the level of sugar (glucose) in your blood, so it is important for you to know the amount that is okay for you to have in every meal. Carbohydrate counting helps keep the level of glucose in your blood within normal limits. The amount of carbohydrates allowed is different for every person. A dietitian can help you calculate the amount that is right for you. Once you know the amount of carbohydrates you can have, you can count the carbohydrates in the foods you want to eat. Carbohydrates are found in the following foods:  Grains, such as breads and cereals.  Dried beans and soy products.  Starchy vegetables, such as potatoes, peas, and corn.  Fruit and fruit juices.  Milk and yogurt.  Sweets and snack foods, such as cake, cookies, candy, chips, soft drinks, and fruit drinks. CARBOHYDRATE COUNTING There are two ways to count the carbohydrates in your food. You can use either of the methods or a combination of both. Reading the "Nutrition Facts" on Packaged Food The "Nutrition Facts" is an area that is included on the labels of almost all packaged food and beverages in the [place]. It includes the serving size of that food or beverage and information about the nutrients in each serving of the food, including the grams (g) of carbohydrate per serving.  Decide the number of servings of this food or beverage that you will be able to eat or drink. Multiply that number of servings by the number of grams of carbohydrate that is listed on the label for that serving. The total will be the amount of carbohydrates you will be having when you eat or drink this food  or beverage. Learning Standard Serving Sizes of Food When you eat food that is not packaged or does not include "Nutrition Facts" on the label, you need to measure the servings in order to count the amount of carbohydrates.A serving of most carbohydrate-rich foods contains about 15 g of carbohydrates. The following list includes serving sizes of carbohydrate-rich foods that provide 15 g ofcarbohydrate per serving:   1 slice of bread (1 oz) or 1 six-inch tortilla.    of a hamburger bun or English muffin.  4-6 crackers.   cup unsweetened dry cereal.    cup hot cereal.   cup rice or pasta.    cup mashed potatoes or  of a large baked potato.  1 cup fresh fruit or one small piece of fruit.    cup canned or frozen fruit or fruit juice.  1 cup milk.   cup plain fat-free yogurt or yogurt sweetened with artificial sweeteners.   cup cooked dried beans or starchy vegetable, such as peas, corn, or potatoes.  Decide the number of standard-size servings that you will eat. Multiply that number of servings by 15 (the grams of carbohydrates in that serving). For example, if you eat 2 cups of strawberries, you will have eaten 2 servings and 30 g of carbohydrates (2 servings x 15 g = 30 g). For foods such as soups and casseroles, in which more than one food is mixed in, you will need to count the carbohydrates in each food that is included. EXAMPLE  OF CARBOHYDRATE COUNTING Sample Dinner  3 oz chicken breast.   cup of brown rice.   cup of corn.  1 cup milk.   1 cup strawberries with sugar-free whipped topping.  Carbohydrate Calculation Step 1: Identify the foods that contain carbohydrates:   Rice.   Corn.   Milk.   Strawberries. Step 2:Calculate the number of servings eaten of each:   2 servings of rice.   1 serving of corn.   1 serving of milk.   1 serving of strawberries. Step 3: Multiply each of those number of servings by 15 g:   2 servings of  rice x 15 g = 30 g.   1 serving of corn x 15 g = 15 g.   1 serving of milk x 15 g = 15 g.   1 serving of strawberries x 15 g = 15 g. Step 4: Add together all of the amounts to find the total grams of carbohydrates eaten: 30 g + 15 g + 15 g + 15 g = 75 g.   This information is not intended to replace advice given to you by your health care provider. Make sure you discuss any questions you have with your health care provider.   Document Released: 07/23/2005 Document Revised: 08/13/2014 Document Reviewed: 06/19/2013 Elsevier Interactive Patient Education [employer]

## 2015-09-20 NOTE — Progress Notes (Signed)
Patient ID: Sean Horn, male   DOB: [birth year], 55 y.o.   MRN: [id number]   Subjective:    Patient ID: Sean Horn, male    DOB: [birth year], 55 y.o.   MRN: [id number]  Chief Complaint  Patient presents with  . Follow-up    HPI Patient is in today for follow up. He has not been taking his Invokamet due to loss of insurance and financial concerns. He acknowledges polyuria and polydipsia. He ate lunch before coming to his visit. He continues to struggle with chronic right leg pain and is supposed to see pain management soon. Denies CP/palp/SOB/HA/congestion/fevers/GI or GU c/o. Taking meds as prescribed  Past Medical History  Diagnosis Date  . PONV (postoperative nausea and vomiting)   . Anxiety   . History of kidney stones   . GERD (gastroesophageal reflux disease)     occasional - OTC as needed  . Non-insulin dependent type 2 diabetes mellitus (HCC)   . Hypertension     under control with med., had been on med. since age 88  . Osteoarthritis     right knee  . PONV (postoperative nausea and vomiting)   . Tachycardia   . History of chicken pox   . History of shingles   . Preventative health care 08/14/2013    Past Surgical History  Procedure Laterality Date  . Wrist surgery Right     x 3, torn ligaments, pins placed then infected, then fused with plate  . Shoulder arthroscopy w/ rotator cuff repair Left 10/20/2004    x 4, had to have part of clavile removed. torn rotator cuff multiple times, had to place screws   . Knee arthroscopy Left 1980's  . Cholecystectomy  1980's  . 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 Shoulder adhesion release Left ` 1998  . Knee arthroscopy Right 2013  . Lithotripsy      multiple, b/l  . Cystoscopy/retrograde/ureteroscopy/stone extraction with basket Right 08/27/2003    and  double J stent placement  . Shoulder arthroscopy w/ superior labral anterior posterior lesion repair Left 12/22/2004  . Knee arthroscopy Right 06/30/2013    Procedure: RIGHT KNEE ARTHROSCOPY AND SYNOVECTOMY;  Surgeon: Velna Ochs, MD;  Location: [facility];  Service: Orthopedics;  Laterality: Right;  . Inguinal hernia repair Left 1990's    Family History  Problem Relation Age of Onset  . COPD Mother     smoker  . Hypertension Mother   . Heart disease Mother     CHF  . Diabetes Father   . Heart disease Father     quintuple bypass  . Stroke Father   . Hypertension Father   . Peripheral vascular disease Father   . Hypertension Son     tachycardia  . Heart disease Maternal Grandfather   . Colon cancer Neg Hx   . Rectal cancer Neg Hx   . Stomach cancer Neg Hx   . Arthritis Paternal Grandmother     Social History   Social History  . Marital Status: Single    Spouse Name: N/A  . Number of Children: N/A  . Years of Education: N/A   Occupational History  . Not on file.   Social History Main Topics  . Smoking status: Never Smoker   . Smokeless tobacco: Current  User    Types: Snuff  . Alcohol Use: Yes     Comment: occasionally  . Drug Use: No  . Sexual Activity: Yes     Comment: currently applying for disability due to knee, lives with girfriend, no dietary restrictions, previously Games developer   Other Topics Concern  . Not on file   Social History Narrative    Outpatient Prescriptions Prior to Visit  Medication Sig Dispense Refill  . carvedilol (COREG) 12.5 MG tablet Take 1 tablet (12.5 mg total) by mouth 2 (two) times daily with a meal. 60 tablet 6  . fenofibrate 160 MG tablet Take 1 tablet (160 mg total) by mouth daily. 30 tablet 8  . glipiZIDE (GLUCOTROL) 10 MG tablet Take 1 tablet (10 mg total) by mouth 2 (two) times daily before a meal. 60 tablet 6  . lansoprazole (PREVACID) 30 MG capsule Take 30 mg by mouth daily at 12 noon.    Marland Kitchen  lisinopril-hydrochlorothiazide (PRINZIDE,ZESTORETIC) 10-12.5 MG per tablet Take 1 tablet by mouth daily. 30 tablet 5  . nystatin (MYCOSTATIN) 100000 UNIT/ML suspension Take 5 mLs (500,000 Units total) by mouth 3 (three) times daily. Apply to affected area 120 mL 1  . Oxycodone HCl 10 MG TABS Take 1-2 tablets (10-20 mg total) by mouth every 4 (four) hours as needed. 45 tablet 0  . RA KRILL OIL 500 MG CAPS Take 1 capsule by mouth daily.    . Turmeric 500 MG CAPS Take 1 capsule by mouth 2 (two) times daily.    . Canagliflozin-Metformin HCl (INVOKAMET) [phone] MG TABS Take by mouth two times daily 60 tablet 6  . escitalopram (LEXAPRO) 20 MG tablet TAKE ONE TABLET BY MOUTH ONCE DAILY 30 tablet 5  . gabapentin (NEURONTIN) 100 MG capsule TAKE TWO CAPSULES BY MOUTH THREE TIMES DAILY 180 capsule 0   No facility-administered medications prior to visit.    Allergies  Allergen Reactions  . Toradol [Ketorolac Tromethamine] Itching    Review of Systems  Constitutional: Positive for malaise/fatigue. Negative for fever.  HENT: Negative for congestion.   Eyes: Negative for discharge.  Respiratory: Negative for shortness of breath.   Cardiovascular: Negative for chest pain, palpitations and leg swelling.  Gastrointestinal: Negative for nausea and abdominal pain.  Genitourinary: Positive for frequency.  Musculoskeletal: Negative for falls.  Skin: Negative for rash.  Neurological: Negative for loss of consciousness and headaches.  Endo/Heme/Allergies: Negative for environmental allergies.  Psychiatric/Behavioral: Negative for depression. The patient is not nervous/anxious.        Objective:    Physical Exam  Constitutional: He is oriented to person, place, and time. He appears well-developed and well-nourished. No distress.  HENT:  Head: Normocephalic and atraumatic.  Nose: Nose normal.  Eyes: Right eye exhibits no discharge. Left eye exhibits no discharge.  Neck: Normal range of motion. Neck  supple.  Cardiovascular: Normal rate and regular rhythm.   No murmur heard. Pulmonary/Chest: Effort normal and breath sounds normal.  Abdominal: Soft. Bowel sounds are normal. There is no tenderness.  Musculoskeletal: He exhibits no edema.  Neurological: He is alert and oriented to person, place, and time.  Skin: Skin is warm and dry.  Psychiatric: He has a normal mood and affect.  Nursing note and vitals reviewed.   BP 120/84 mmHg  Pulse 99  Temp(Src) 98.2 F (36.8 C) (Oral)  Ht 6' (1.829 m)  Wt 217 lb 8 oz (98.657 kg)  BMI 29.49 kg/m2  SpO2 96% Wt Readings from Last 3 Encounters:  09/20/15 217 lb 8 oz (98.657 kg)  06/02/15 215 lb (97.523 kg)  01/24/15 211 lb 2 oz (95.766 kg)     Lab Results  Component Value Date   WBC 7.8 05/26/2015   HGB 16.9 05/26/2015   HCT 49.5 05/26/2015   PLT 280.0 05/26/2015   GLUCOSE 190* 05/26/2015   CHOL 197 05/26/2015   TRIG 280.0* 05/26/2015   HDL 38.20* 05/26/2015   LDLDIRECT 132.0 05/26/2015   ALT 28 05/26/2015   AST 22 05/26/2015   NA 137 05/26/2015   K 3.8 05/26/2015   CL 98 05/26/2015   CREATININE 1.18 05/26/2015   BUN 13 05/26/2015   CO2 29 05/26/2015   TSH 3.49 05/26/2015   INR 1.03 12/16/2012   HGBA1C 7.3* 05/26/2015    Lab Results  Component Value Date   TSH 3.49 05/26/2015   Lab Results  Component Value Date   WBC 7.8 05/26/2015   HGB 16.9 05/26/2015   HCT 49.5 05/26/2015   MCV 92.2 05/26/2015   PLT 280.0 05/26/2015   Lab Results  Component Value Date   NA 137 05/26/2015   K 3.8 05/26/2015   CO2 29 05/26/2015   GLUCOSE 190* 05/26/2015   BUN 13 05/26/2015   CREATININE 1.18 05/26/2015   BILITOT 1.4* 05/26/2015   ALKPHOS 49 05/26/2015   AST 22 05/26/2015   ALT 28 05/26/2015   PROT 7.4 05/26/2015   ALBUMIN 4.5 05/26/2015   CALCIUM 9.6 05/26/2015   GFR 68.40 05/26/2015   Lab Results  Component Value Date   CHOL 197 05/26/2015   Lab Results  Component Value Date   HDL 38.20* 05/26/2015   No  results found for: [facility] Lab Results  Component Value Date   TRIG 280.0* 05/26/2015   Lab Results  Component Value Date   CHOLHDL 5 05/26/2015   Lab Results  Component Value Date   HGBA1C 7.3* 05/26/2015       Assessment & Plan:   Problem List Items Addressed This Visit    Hypertension - Primary    Well controlled, no changes to meds. Encouraged heart healthy diet such as the DASH diet and exercise as tolerated.       Non-insulin dependent type 2 diabetes mellitus (HCC)    Patient has not been taking his Invokamet due to loss of insurance and financial concerns. Unfortunately upon checking his sugar it is greater than 500. He is given samples of Humalog and given 10 units in office. He needs to minimize simple carbs and he is given rx for Invokana 300 mg po qd with free coupon for a month and a prescription for Metformin 1000 mg po bid. He is advised to use Humalog with meals 2-4 units and report numbers.       Relevant Medications   metFORMIN (GLUCOPHAGE) 1000 MG tablet   insulin lispro (HUMALOG) 100 UNIT/ML injection   canagliflozin (INVOKANA) 300 MG TABS tablet   Tachycardia    Mild, avoid caffeine.          I have discontinued Mr. Bazinet Canagliflozin-Metformin HCl. I am also having him start on metFORMIN, insulin lispro, and canagliflozin. Additionally, I am having him maintain his RA KRILL OIL, lansoprazole, Turmeric, glipiZIDE, nystatin, lisinopril-hydrochlorothiazide, fenofibrate, carvedilol, and Oxycodone HCl.  Meds ordered this encounter  Medications  . metFORMIN (GLUCOPHAGE) 1000 MG tablet    Sig: Take 1 tablet (1,000 mg total) by mouth 2 (two) times daily with a meal.    Dispense:  60 tablet  Refill:  5  . insulin lispro (HUMALOG) 100 UNIT/ML injection    Sig: Inject 0.02-0.04 mLs (2-4 Units total) into the skin 3 (three) times daily before meals.    Dispense:  10 mL    Refill:  11  . canagliflozin (INVOKANA) 300 MG TABS tablet    Sig: Take 1 tablet  (300 mg total) by mouth daily before breakfast.    Dispense:  30 tablet    Refill:  0     Danise Edge, MD

## 2015-09-21 ENCOUNTER — Encounter: Payer: Self-pay | Admitting: Physical Medicine & Rehabilitation

## 2015-09-21 ENCOUNTER — Encounter: Payer: Self-pay | Attending: Physical Medicine & Rehabilitation | Admitting: Physical Medicine & Rehabilitation

## 2015-09-21 VITALS — BP 127/77 | HR 97 | Resp 14

## 2015-09-21 DIAGNOSIS — F419 Anxiety disorder, unspecified: Secondary | ICD-10-CM | POA: Insufficient documentation

## 2015-09-21 DIAGNOSIS — R269 Unspecified abnormalities of gait and mobility: Secondary | ICD-10-CM

## 2015-09-21 DIAGNOSIS — F17290 Nicotine dependence, other tobacco product, uncomplicated: Secondary | ICD-10-CM | POA: Insufficient documentation

## 2015-09-21 DIAGNOSIS — Z87442 Personal history of urinary calculi: Secondary | ICD-10-CM | POA: Insufficient documentation

## 2015-09-21 DIAGNOSIS — K219 Gastro-esophageal reflux disease without esophagitis: Secondary | ICD-10-CM | POA: Insufficient documentation

## 2015-09-21 DIAGNOSIS — M25561 Pain in right knee: Secondary | ICD-10-CM | POA: Insufficient documentation

## 2015-09-21 DIAGNOSIS — Z96651 Presence of right artificial knee joint: Secondary | ICD-10-CM | POA: Insufficient documentation

## 2015-09-21 DIAGNOSIS — I10 Essential (primary) hypertension: Secondary | ICD-10-CM | POA: Insufficient documentation

## 2015-09-21 DIAGNOSIS — E119 Type 2 diabetes mellitus without complications: Secondary | ICD-10-CM | POA: Insufficient documentation

## 2015-09-21 DIAGNOSIS — F329 Major depressive disorder, single episode, unspecified: Secondary | ICD-10-CM | POA: Insufficient documentation

## 2015-09-21 DIAGNOSIS — M1711 Unilateral primary osteoarthritis, right knee: Secondary | ICD-10-CM | POA: Insufficient documentation

## 2015-09-21 MED ORDER — DULOXETINE HCL 30 MG PO CPEP: 30.0000 mg | ORAL_CAPSULE | Freq: Every day | ORAL | Status: DC

## 2015-09-21 MED ORDER — GABAPENTIN 400 MG PO CAPS: 400.0000 mg | ORAL_CAPSULE | Freq: Every day | ORAL | Status: DC

## 2015-09-21 MED ORDER — GABAPENTIN 100 MG PO CAPS: 200.0000 mg | ORAL_CAPSULE | Freq: Two times a day (BID) | ORAL | Status: DC

## 2015-09-21 NOTE — Progress Notes (Signed)
Subjective:    Patient ID: Sean Horn, male    DOB: 22-Jan-1961, 55 y.o.   MRN: [id number]  HPI 55 y/o with hx of anxiety, fairly well controlled non-insulin dependent DM, OA of right knee and psh of TKA x2 in right knee presents for follow up of pain in his right knee. This was diagnosed by Ortho ~4 with OA and most revision was ~summer 2015. 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 Pain meds improve the pain (Oxycodone  now ~1-2/day), ice (helps), Voltaren gel (no real benefit), heat (with occasional benefits). Prolonged postures, bending, squatting, steps exacerbate the pain. Dull, achy, constant pain. It occasionally radiates to his right hip. Today severity is 8/10 (due to the long drive). The pain is interfering with his ability to work and enjoy activities, such as golf as well as his sleep.   Last office visit on 08/10/15, he was instructed to continue home exercise program, which he state he has been doing.  He was encouraged to [facility] for pool therapy, which he started.  He has been 3 times and is unsure if it helps.  His PCP wrote a script for Oxycodone for 45 pills.  Pt does not have insurance, and the CPM machine was too expensive.  He did obtain a polar ice machine, which appears to help.  He denies falls, but believes he has unsteadiness, particularly on uneven terrain.    Pain Inventory Average Pain 8 Pain Right Now NA My pain is dull and aching  In the last 24 hours, has pain interfered with the following? General activity 7 Relation with others 6 Enjoyment of life 5 What TIME of day is your pain at its worst? night Sleep (in general) Poor  Pain is worse with: walking, bending, sitting and standing Pain improves with: rest, heat/ice and medication Relief from Meds: 6  Mobility use a cane how many minutes can you walk? 10-15 ability to climb steps?  no do you drive?   yes  Function I need assistance with the following:  household duties  Neuro/Psych trouble walking anxiety  Prior Studies Any changes since last visit?  no  Physicians involved in your care Any changes since last visit?  no Primary care .   Family History  Problem Relation Age of Onset  . COPD Mother     smoker  . Hypertension Mother   . Heart disease Mother     CHF  . Diabetes Father   . Heart disease Father     quintuple bypass  . Stroke Father   . Hypertension Father   . Peripheral vascular disease Father   . Hypertension Son     tachycardia  . Heart disease Maternal Grandfather   . Colon cancer Neg Hx   . Rectal cancer Neg Hx   . Stomach cancer Neg Hx   . Arthritis Paternal Grandmother    Social History   Social History  . Marital Status: Single    Spouse Name: N/A  . Number of Children: N/A  . Years of Education: N/A   Social History Main Topics  . Smoking status: Never Smoker   . Smokeless tobacco: Current User    Types: Snuff  . Alcohol Use: Yes     Comment: occasionally  . Drug Use: No  . Sexual Activity: Yes     Comment: currently applying for disability due to knee, lives with girfriend,  no dietary restrictions, previously Games developer   Other Topics Concern  . None   Social History Narrative   Past Surgical History  Procedure Laterality Date  . Wrist surgery Right     x 3, torn ligaments, pins placed then infected, then fused with plate  . Shoulder arthroscopy w/ rotator cuff repair Left 10/20/2004    x 4, had to have part of clavile removed. torn rotator cuff multiple times, had to place screws   . Knee arthroscopy Left 1980's  . Cholecystectomy  1980's  . 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 Shoulder adhesion release Left ` 1998  . Knee arthroscopy Right 2013   . Lithotripsy      multiple, b/l  . Cystoscopy/retrograde/ureteroscopy/stone extraction with basket Right 08/27/2003    and double J stent placement  . Shoulder arthroscopy w/ superior labral anterior posterior lesion repair Left 12/22/2004  . Knee arthroscopy Right 06/30/2013    Procedure: RIGHT KNEE ARTHROSCOPY AND SYNOVECTOMY;  Surgeon: Velna Ochs, MD;  Location: [facility];  Service: Orthopedics;  Laterality: Right;  . Inguinal hernia repair Left 1990's   Past Medical History  Diagnosis Date  . PONV (postoperative nausea and vomiting)   . Anxiety   . History of kidney stones   . GERD (gastroesophageal reflux disease)     occasional - OTC as needed  . Non-insulin dependent type 2 diabetes mellitus (HCC)   . Hypertension     under control with med., had been on med. since age 32  . Osteoarthritis     right knee  . PONV (postoperative nausea and vomiting)   . Tachycardia   . History of chicken pox   . History of shingles   . Preventative health care 08/14/2013   BP 127/77 mmHg  Pulse 97  Resp 14  SpO2 96%  Opioid Risk Score:   Fall Risk Score:  `1  Depression screen PHQ 2/9  Depression screen PHQ 2/9 08/10/2015  Decreased Interest 2  Down, Depressed, Hopeless 3  PHQ - 2 Score 5  Altered sleeping 3  Tired, decreased energy 2  Change in appetite 3  Feeling bad or failure about yourself  3  Trouble concentrating 1  Moving slowly or fidgety/restless 3  Suicidal thoughts 0  PHQ-9 Score 20  Difficult doing work/chores Very difficult     Review of Systems  Endocrine:       High blood sugar  Musculoskeletal: Positive for gait problem.  Psychiatric/Behavioral: The patient is nervous/anxious.   All other systems reviewed and are negative.      Objective:   Physical Exam HENT: Normocephalic, Atraumatic Eyes: EOMI, Conj WNL Cardio: S1, S2 normal, RRR Pulm: B/l clear to auscultation. Effort normal Abd: Soft, non-distended, non-tender,  BS+ MSK: Gait Antalgic. Mild TTP right knee. Neg TTP b/l greater trochanter. Neg TTP lower back. No atrophy. No edema.  Neuro: CN II-XII grossly intact.  Sensation intact to light touch in all LE dermatomes Reflexes 2+ throughout LE, except 1+ right knee Strength 5/5 in all LE myotomes Skin: Warm and Dry. Healer right knee surgical scar.    Assessment & Plan:  55 y/o with hx of anxiety, fairly well controlled non-insulin dependent DM, OA of right knee and psh of TKA x2 in right knee presents for follow  up of pain in his right knee.  1. Right knee pain S/p arthroscopy x2, TKA, and TKA revision (most recently ~May 2015) Major complaint is lack of sleep due to pain and stiffness. Pt has had PT after his most recent revision, but states his improvement plateaued. He continues some of the home exercise programs, continue.  Pt has tried braces without significant benefit.  TENS unit does not provide relief.   Cont aquatic therapy.  Oxycodone from PCP ~2/days helps with the pain, recently filled by PCP Cont polar ice Will increase Gabapentin to 200 BID and 400 qHS  Cymbalta 30 daily prescribed  Prescription for cane provided to avoid falls Will consider Lidoderm patch in future after insurance Will consider CPM machine trial after insurance If ineffective, will also consider nerve block in future  Information provided regarding financial assistance programs  2. Abnormality of gait  Prescription for cane

## 2015-09-22 ENCOUNTER — Other Ambulatory Visit: Payer: Self-pay | Admitting: Family Medicine

## 2015-09-22 NOTE — Telephone Encounter (Signed)
Called the patient left message to call back to confirm ok to send to [facility] (new pharmacy fax request).

## 2015-09-23 MED ORDER — ESCITALOPRAM OXALATE 20 MG PO TABS: 20.0000 mg | ORAL_TABLET | Freq: Every day | ORAL | Status: DC

## 2015-09-23 NOTE — Telephone Encounter (Signed)
Spoke to the pateints son who said patient does want to have prescriptions sent in to this pharmacy. Sent in prescription.

## 2015-09-25 ENCOUNTER — Encounter: Payer: Self-pay | Admitting: Family Medicine

## 2015-09-25 NOTE — Assessment & Plan Note (Addendum)
Patient has not been taking his Invokamet due to loss of insurance and financial concerns. Unfortunately upon checking his sugar it is greater than 500. He is given samples of Humalog and given 10 units in office. He needs to minimize simple carbs and he is given rx for Invokana 300 mg po qd with free coupon for a month and a prescription for Metformin 1000 mg po bid. He is advised to use Humalog with meals 2-4 units and report numbers.

## 2015-09-25 NOTE — Assessment & Plan Note (Signed)
Well controlled, no changes to meds. Encouraged heart healthy diet such as the DASH diet and exercise as tolerated.  °

## 2015-09-25 NOTE — Assessment & Plan Note (Signed)
Mild, avoid caffeine.

## 2015-10-19 ENCOUNTER — Encounter: Payer: Self-pay | Admitting: Physical Medicine & Rehabilitation

## 2015-10-28 ENCOUNTER — Encounter: Payer: Self-pay | Admitting: Physical Medicine & Rehabilitation

## 2015-10-28 ENCOUNTER — Encounter: Payer: Self-pay | Attending: Physical Medicine & Rehabilitation | Admitting: Physical Medicine & Rehabilitation

## 2015-10-28 VITALS — BP 146/87 | HR 89 | Resp 14

## 2015-10-28 DIAGNOSIS — E119 Type 2 diabetes mellitus without complications: Secondary | ICD-10-CM | POA: Insufficient documentation

## 2015-10-28 DIAGNOSIS — M792 Neuralgia and neuritis, unspecified: Secondary | ICD-10-CM

## 2015-10-28 DIAGNOSIS — Z79899 Other long term (current) drug therapy: Secondary | ICD-10-CM

## 2015-10-28 DIAGNOSIS — F329 Major depressive disorder, single episode, unspecified: Secondary | ICD-10-CM | POA: Insufficient documentation

## 2015-10-28 DIAGNOSIS — I10 Essential (primary) hypertension: Secondary | ICD-10-CM | POA: Insufficient documentation

## 2015-10-28 DIAGNOSIS — Z87442 Personal history of urinary calculi: Secondary | ICD-10-CM | POA: Insufficient documentation

## 2015-10-28 DIAGNOSIS — K219 Gastro-esophageal reflux disease without esophagitis: Secondary | ICD-10-CM | POA: Insufficient documentation

## 2015-10-28 DIAGNOSIS — G894 Chronic pain syndrome: Secondary | ICD-10-CM

## 2015-10-28 DIAGNOSIS — M1711 Unilateral primary osteoarthritis, right knee: Secondary | ICD-10-CM | POA: Insufficient documentation

## 2015-10-28 DIAGNOSIS — Z5181 Encounter for therapeutic drug level monitoring: Secondary | ICD-10-CM

## 2015-10-28 DIAGNOSIS — Z96651 Presence of right artificial knee joint: Secondary | ICD-10-CM | POA: Insufficient documentation

## 2015-10-28 DIAGNOSIS — F17290 Nicotine dependence, other tobacco product, uncomplicated: Secondary | ICD-10-CM | POA: Insufficient documentation

## 2015-10-28 DIAGNOSIS — M25561 Pain in right knee: Secondary | ICD-10-CM | POA: Insufficient documentation

## 2015-10-28 DIAGNOSIS — F419 Anxiety disorder, unspecified: Secondary | ICD-10-CM | POA: Insufficient documentation

## 2015-10-28 DIAGNOSIS — R269 Unspecified abnormalities of gait and mobility: Secondary | ICD-10-CM

## 2015-10-28 MED ORDER — GABAPENTIN 800 MG PO TABS: 800.0000 mg | ORAL_TABLET | Freq: Every day | ORAL | Status: DC

## 2015-10-28 MED ORDER — DULOXETINE HCL 60 MG PO CPEP: 60.0000 mg | ORAL_CAPSULE | Freq: Every day | ORAL | Status: DC

## 2015-10-28 MED ORDER — OXYCODONE HCL 5 MG PO TABS: 5.0000 mg | ORAL_TABLET | Freq: Every evening | ORAL | Status: DC | PRN

## 2015-10-28 NOTE — Progress Notes (Addendum)
Subjective:    Patient ID: Sean Horn, male    DOB: 01-Aug-1961, 55 y.o.   MRN: [id number]  HPI  55 y/o with hx of anxiety, fairly well controlled non-insulin dependent DM, OA of right knee and psh of TKA x2 in right knee presents for follow up of pain in his right knee.  This was diagnosed by Ortho ~2012 with OA and most revision was ~summer 2015.  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  He is currently on disability.  Pain meds improve the pain (Oxycodone  now ~1/day, typically at night), ice (helps), Voltaren gel (no real benefit), heat (with occasional benefits).  Prolonged postures, bending, squatting, steps exacerbate the pain.  Dull, achy, constant pain.  It occasionally radiates to his right hip.  Today severity is 7/10 (due to the long drive).  The pain is interfering with his ability to work and enjoy activities, such as golf as well as his sleep.  His sleep has improved.    Last office visit on 09/21/15, he was instructed to continue home exercise program, which he state he has been doing.  He was encouraged to go to the [facility] for pool therapy, which he believes is helping.  His Gabapentin was increased, which helped him sleep better at night.  He was ordered a cane, which he uses.  He denies falls.  He believes the Cymbalta he was prescribed is helping.  Polar ice continues to provide relief.  He was given information regarding financial assistance programs, which he sent to be reviewed.  He is still awaiting hearing for SS disability.    Pain Inventory Average Pain 6 Pain Right Now NA My pain is dull and aching  In the last 24 hours, has pain interfered with the following? General activity 7 Relation with others 4 Enjoyment of life 7 What TIME of day is your pain at its worst? night Sleep (in general) Fair  Pain is worse with: walking, bending, sitting, standing and some activites Pain improves with: rest, heat/ice and medication Relief from  Meds: 7  Mobility use a cane ability to climb steps?  no do you drive?  yes  Function Do you have any goals in this area?  no  Neuro/Psych weakness trouble walking anxiety  Prior Studies Any changes since last visit?  no  Physicians involved in your care Any changes since last visit?  no   Family History  Problem Relation Age of Onset  . COPD Mother     smoker  . Hypertension Mother   . Heart disease Mother     CHF  . Diabetes Father   . Heart disease Father     quintuple bypass  . Stroke Father   . Hypertension Father   . Peripheral vascular disease Father   . Hypertension Son     tachycardia  . Heart disease Maternal Grandfather   . Colon cancer Neg Hx   . Rectal cancer Neg Hx   . Stomach cancer Neg Hx   . Arthritis Paternal Grandmother    Social History   Social History  . Marital Status: Single    Spouse Name: N/A  . Number of Children: N/A  . Years of Education: N/A   Social History Main Topics  . Smoking status: Never Smoker   . Smokeless tobacco: Current User    Types: Snuff  . Alcohol Use: Yes     Comment: occasionally  . Drug Use: No  .  Sexual Activity: Yes     Comment: currently applying for disability due to knee, lives with girfriend, no dietary restrictions, previously Games developerdiesel mechanic   Other Topics Concern  . None   Social History Narrative   Past Surgical History  Procedure Laterality Date  . Wrist surgery Right     x 3, torn ligaments, pins placed then infected, then fused with plate  . Shoulder arthroscopy w/ rotator cuff repair Left 10/20/2004    x 4, had to have part of clavile removed. torn rotator cuff multiple times, had to place screws   . Knee arthroscopy Left 1980's  . Cholecystectomy  1980's  . 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 Shoulder adhesion  release Left ` 1998  . Knee arthroscopy Right 2013  . Lithotripsy      multiple, b/l  . Cystoscopy/retrograde/ureteroscopy/stone extraction with basket Right 08/27/2003    and double J stent placement  . Shoulder arthroscopy w/ superior labral anterior posterior lesion repair Left 12/22/2004  . Knee arthroscopy Right 06/30/2013    Procedure: RIGHT KNEE ARTHROSCOPY AND SYNOVECTOMY;  Surgeon: Velna OchsPeter G Dalldorf, MD;  Location: [facility];  Service: Orthopedics;  Laterality: Right;  . Inguinal hernia repair Left 1990's   Past Medical History  Diagnosis Date  . PONV (postoperative nausea and vomiting)   . Anxiety   . History of kidney stones   . GERD (gastroesophageal reflux disease)     occasional - OTC as needed  . Non-insulin dependent type 2 diabetes mellitus (HCC)   . Hypertension     under control with med., had been on med. since age 55  . Osteoarthritis     right knee  . PONV (postoperative nausea and vomiting)   . Tachycardia   . History of chicken pox   . History of shingles   . Preventative health care 08/14/2013   BP 146/87 mmHg  Pulse 89  Resp 14  SpO2 97%  Opioid Risk Score:   Fall Risk Score:  `1  Depression screen PHQ 2/9  Depression screen PHQ 2/9 08/10/2015  Decreased Interest 2  Down, Depressed, Hopeless 3  PHQ - 2 Score 5  Altered sleeping 3  Tired, decreased energy 2  Change in appetite 3  Feeling bad or failure about yourself  3  Trouble concentrating 1  Moving slowly or fidgety/restless 3  Suicidal thoughts 0  PHQ-9 Score 20  Difficult doing work/chores Very difficult   Review of Systems  Constitutional: Positive for diaphoresis.  Musculoskeletal: Positive for gait problem.  Neurological: Positive for weakness.  Psychiatric/Behavioral: The patient is nervous/anxious.   All other systems reviewed and are negative.     Objective:   Physical Exam HENT: Normocephalic, Atraumatic Eyes: EOMI, Conj WNL Cardio: S1, S2 normal,  RRR Pulm: B/l clear to auscultation.  Effort normal Abd: Soft, non-distended, non-tender, BS+ MSK:  Gait Antalgic.             Mild TTP right knee.    Neg TTP b/l greater trochanter.    Neg TTP lower back.   No atrophy.             No edema.   Neuro: CN II-XII grossly intact.                Sensation intact to  light touch in all LE dermatomes             Reflexes 2+ throughout LE, except 1+ right knee             Strength          5/5 in all LE myotomes Skin: Warm and Dry.  Healer right knee surgical scar.    Assessment & Plan:  55 y/o with hx of anxiety, fairly well controlled non-insulin dependent DM, OA of right knee and psh of TKA x2 in right knee presents for follow up of pain in his right knee.  1. Right knee pain secondary to DJD, neuropathic pain, and post-op pain             S/p arthroscopy x2, TKA, and TKA revision (most recently ~May 2015)             Major complaint is lack of sleep due to pain and stiffness.             Pt has had PT after his most recent revision, but states his improvement plateaued.  He continues some of the home exercise programs, continue.               Pt has tried braces without significant benefit.                TENS unit does not provide relief.                Cont aquatic therapy.                 Oxycodone from PCP ~1/day helps with the pain, will provide 30 tabs with plan to wean - Will have pt sign pain contract and obtain UDS             Cont polar ice             Will increase Gabapentin to 200 BID and 800 qHS             Will increase Cymbalta  daily prescribed             Will consider Lidoderm patch in future after insurance             Will consider CPM machine trial after insurance             If ineffective, will also consider nerve block in future             Information provided regarding financial assistance programs  Will consider Biowave after insurance approval if necessary at that time  Pt to bring in recent lab work  (plan to have it done by PCP next week) on next visit  2. Abnormality of gait             Cont cane for safety

## 2015-10-28 NOTE — Addendum Note (Signed)
Addended by: Kathrine HaddockWOOD, Liliyana Thobe L on: 10/28/2015 02:27 PM   Modules accepted: Orders

## 2015-11-02 LAB — TOXASSURE SELECT,+ANTIDEPR,UR: PDF: 0

## 2015-11-03 NOTE — Progress Notes (Signed)
Urine drug screen for this encounter is consistent for prescribed medication 

## 2015-11-14 ENCOUNTER — Telehealth: Payer: Self-pay | Admitting: *Deleted

## 2015-11-14 NOTE — Telephone Encounter (Signed)
Received request for medical records, forwarded to [place] for email/scan//SLS

## 2015-11-23 ENCOUNTER — Other Ambulatory Visit: Payer: Self-pay | Admitting: *Deleted

## 2015-11-23 MED ORDER — GLIPIZIDE 10 MG PO TABS: 10.0000 mg | ORAL_TABLET | Freq: Two times a day (BID) | ORAL | Status: DC

## 2015-11-23 NOTE — Progress Notes (Signed)
Patient requested refill on Glipizide; escribed to [facility] 90-day supply/SLS 04/19

## 2015-11-28 ENCOUNTER — Encounter: Payer: Self-pay | Admitting: Family Medicine

## 2015-11-29 ENCOUNTER — Telehealth: Payer: Self-pay | Admitting: *Deleted

## 2015-11-29 ENCOUNTER — Telehealth: Payer: Self-pay | Admitting: Family Medicine

## 2015-11-29 NOTE — Telephone Encounter (Signed)
Forwarded to Dr. Blyth. JG//CMA  

## 2015-11-29 NOTE — Telephone Encounter (Signed)
Called the patient on both cell/home number left detailed message to call back. PCP attempting to complete his disability form and needed to ask the patient a question regarding if he required the use of an assistive device to ambulate?

## 2015-12-06 NOTE — Telephone Encounter (Signed)
Form was completed and faxed

## 2015-12-09 ENCOUNTER — Encounter: Payer: Self-pay | Admitting: Physical Medicine & Rehabilitation

## 2015-12-14 ENCOUNTER — Encounter: Payer: Self-pay | Admitting: Physical Medicine & Rehabilitation

## 2015-12-14 ENCOUNTER — Encounter: Payer: Self-pay | Attending: Physical Medicine & Rehabilitation | Admitting: Physical Medicine & Rehabilitation

## 2015-12-14 VITALS — BP 138/90 | HR 106 | Resp 14

## 2015-12-14 DIAGNOSIS — F17290 Nicotine dependence, other tobacco product, uncomplicated: Secondary | ICD-10-CM | POA: Insufficient documentation

## 2015-12-14 DIAGNOSIS — F419 Anxiety disorder, unspecified: Secondary | ICD-10-CM

## 2015-12-14 DIAGNOSIS — R269 Unspecified abnormalities of gait and mobility: Secondary | ICD-10-CM

## 2015-12-14 DIAGNOSIS — M1711 Unilateral primary osteoarthritis, right knee: Secondary | ICD-10-CM

## 2015-12-14 DIAGNOSIS — I10 Essential (primary) hypertension: Secondary | ICD-10-CM | POA: Insufficient documentation

## 2015-12-14 DIAGNOSIS — G8929 Other chronic pain: Secondary | ICD-10-CM

## 2015-12-14 DIAGNOSIS — Z87442 Personal history of urinary calculi: Secondary | ICD-10-CM | POA: Insufficient documentation

## 2015-12-14 DIAGNOSIS — Z96651 Presence of right artificial knee joint: Secondary | ICD-10-CM | POA: Insufficient documentation

## 2015-12-14 DIAGNOSIS — K219 Gastro-esophageal reflux disease without esophagitis: Secondary | ICD-10-CM | POA: Insufficient documentation

## 2015-12-14 DIAGNOSIS — G4700 Insomnia, unspecified: Secondary | ICD-10-CM

## 2015-12-14 DIAGNOSIS — M25561 Pain in right knee: Secondary | ICD-10-CM | POA: Insufficient documentation

## 2015-12-14 DIAGNOSIS — F329 Major depressive disorder, single episode, unspecified: Secondary | ICD-10-CM | POA: Insufficient documentation

## 2015-12-14 DIAGNOSIS — E119 Type 2 diabetes mellitus without complications: Secondary | ICD-10-CM | POA: Insufficient documentation

## 2015-12-14 MED ORDER — GABAPENTIN 800 MG PO TABS: 800.0000 mg | ORAL_TABLET | Freq: Every day | ORAL | Status: DC

## 2015-12-14 MED ORDER — DULOXETINE HCL 60 MG PO CPEP: 60.0000 mg | ORAL_CAPSULE | Freq: Every day | ORAL | Status: DC

## 2015-12-14 MED ORDER — GABAPENTIN 100 MG PO CAPS: 200.0000 mg | ORAL_CAPSULE | Freq: Two times a day (BID) | ORAL | Status: DC

## 2015-12-14 MED ORDER — OXYCODONE HCL 5 MG PO TABS: 5.0000 mg | ORAL_TABLET | Freq: Two times a day (BID) | ORAL | Status: DC | PRN

## 2015-12-14 MED ORDER — CYCLOBENZAPRINE HCL 10 MG PO TABS: 10.0000 mg | ORAL_TABLET | Freq: Every day | ORAL | Status: DC

## 2015-12-14 NOTE — Progress Notes (Signed)
Subjective:    Patient ID: Sean Horn, male    DOB: 1960-10-08, 55 y.o.   MRN: [id number]  HPI  55 y/o with hx of anxiety, fairly well controlled non-insulin dependent DM, OA of right knee and psh of TKA x2 in right knee presents for follow up of pain in his right knee.  This was diagnosed by Ortho ~2012 with OA and most revision was ~summer 2015.  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  He is currently on disability.  Pain meds improve the pain (Oxycodone 10mg  now ~1-2/day, typically at night), ice (helps), Voltaren gel (no real benefit), heat (with occasional benefits).  Prolonged postures, bending, squatting, steps exacerbate the pain.  Dull, achy, constant pain.  It occasionally radiates to his right hip.  Today severity is 8/10 (due to the long drive).  The pain is interfering with his ability to work and enjoy activities, such as golf as well as his sleep.  His sleep has improved.   Last visit he was encouraged to cont aquatic therapy, which he is doing - 3/week.  Continues to use polar ice with benefit.  Gabapentin was increased, which seems to help.  Cymbalta was increased with benefit.  He was filled out the paperwork for financial assistance (only [facility]) and states he was told 1 bill would be paid.    Pain Inventory Average Pain 6 Pain Right Now 8 My pain is aching  In the last 24 hours, has pain interfered with the following? General activity 5 Relation with others 1 Enjoyment of life 5 What TIME of day is your pain at its worst? evening, night Sleep (in general) Poor  Pain is worse with: walking, bending, sitting and standing Pain improves with: rest, heat/ice and medication Relief from Meds: 7  Mobility walk with assistance use a cane ability to climb steps?  no do you drive?  yes  Function disabled: date disabled .  Neuro/Psych weakness trouble walking anxiety  Prior Studies Any changes since last visit?  no  Physicians  involved in your care Any changes since last visit?  no   Family History  Problem Relation Age of Onset  . COPD Mother     smoker  . Hypertension Mother   . Heart disease Mother     CHF  . Diabetes Father   . Heart disease Father     quintuple bypass  . Stroke Father   . Hypertension Father   . Peripheral vascular disease Father   . Hypertension Son     tachycardia  . Heart disease Maternal Grandfather   . Colon cancer Neg Hx   . Rectal cancer Neg Hx   . Stomach cancer Neg Hx   . Arthritis Paternal Grandmother    Social History   Social History  . Marital Status: Single    Spouse Name: N/A  . Number of Children: N/A  . Years of Education: N/A   Social History Main Topics  . Smoking status: Never Smoker   . Smokeless tobacco: Current User    Types: Snuff  . Alcohol Use: Yes     Comment: occasionally  . Drug Use: No  . Sexual Activity: Yes     Comment: currently applying for disability due to knee, lives with girfriend, no dietary restrictions, previously Games developer   Other Topics Concern  . None   Social History Narrative   Past Surgical History  Procedure Laterality Date  . Wrist  surgery Right     x 3, torn ligaments, pins placed then infected, then fused with plate  . Shoulder arthroscopy w/ rotator cuff repair Left 10/20/2004    x 4, had to have part of clavile removed. torn rotator cuff multiple times, had to place screws   . Knee arthroscopy Left 1980's  . Cholecystectomy  1980's  . 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 Shoulder adhesion release Left ` 1998  . Knee arthroscopy Right 2013  . Lithotripsy      multiple, b/l  . Cystoscopy/retrograde/ureteroscopy/stone extraction with basket Right 08/27/2003    and double J stent placement  . Shoulder arthroscopy w/ superior labral  anterior posterior lesion repair Left 12/22/2004  . Knee arthroscopy Right 06/30/2013    Procedure: RIGHT KNEE ARTHROSCOPY AND SYNOVECTOMY;  Surgeon: Velna OchsPeter G Dalldorf, MD;  Location: [facility];  Service: Orthopedics;  Laterality: Right;  . Inguinal hernia repair Left 1990's   Past Medical History  Diagnosis Date  . PONV (postoperative nausea and vomiting)   . Anxiety   . History of kidney stones   . GERD (gastroesophageal reflux disease)     occasional - OTC as needed  . Non-insulin dependent type 2 diabetes mellitus (HCC)   . Hypertension     under control with med., had been on med. since age 55  . Osteoarthritis     right knee  . PONV (postoperative nausea and vomiting)   . Tachycardia   . History of chicken pox   . History of shingles   . Preventative health care 08/14/2013   BP 138/90 mmHg  Pulse 106  Resp 14  SpO2 96%  Opioid Risk Score:   Fall Risk Score:  `1  Depression screen PHQ 2/9  Depression screen PHQ 2/9 08/10/2015  Decreased Interest 2  Down, Depressed, Hopeless 3  PHQ - 2 Score 5  Altered sleeping 3  Tired, decreased energy 2  Change in appetite 3  Feeling bad or failure about yourself  3  Trouble concentrating 1  Moving slowly or fidgety/restless 3  Suicidal thoughts 0  PHQ-9 Score 20  Difficult doing work/chores Very difficult     Review of Systems  All other systems reviewed and are negative.      Objective:   Physical Exam HENT: Normocephalic, Atraumatic Eyes: EOMI, Conj WNL Cardio: S1, S2 normal, RRR Pulm: B/l clear to auscultation.  Effort normal Abd: Soft, non-distended, non-tender, BS+ MSK:   Gait Antalgic.             Mild TTP right knee.                Neg TTP b/l greater trochanter.                Neg TTP lower back.               No atrophy.             No edema.   Neuro: CN II-XII grossly intact.                Sensation intact to light touch in all LE dermatomes             Reflexes 2+ throughout LE, except  1+ right knee  Strength          5/5 in all LE myotomes Skin: Warm and Dry.  Healer right knee surgical scar.    Assessment & Plan:  55 y/o with hx of anxiety, fairly well controlled non-insulin dependent DM, OA of right knee and psh of TKA x2 in right knee presents for follow up of pain in his right knee.  1. Right knee pain secondary to DJD, neuropathic pain, and post-op pain             S/p arthroscopy x2, TKA, and TKA revision (most recently ~May 2015)             Major complaint is lack of sleep due to pain and stiffness.             Pt has had PT after his most recent revision, but states his improvement plateaued.  He continues some of the home exercise programs, continue.               Pt has tried braces without significant benefit.                TENS unit provides minimal relief.                Cont aquatic therapy.      Cont Oxycodone 1-2 day.  Will have pt sign pain contract and obtain UDS             Cont polar ice             Cont Gabapentin to 200 BID and 800 qHS             Cont Cymbalta  daily prescribed             Will consider Lidoderm patch in future after insurance, for the time being pt will try OTC patches             Will consider CPM machine trial after insurance             If ineffective, will also consider nerve block in future             Will consider Biowave after insurance approval if necessary at that time             Pt to bring in recent lab work (plan to have it done by PCP next week) on next visit (has not gone yet)   Pt to have his SSD hearing in August  Will order Flexeril  qhs  2. Abnormality of gait             Cont cane for safety  3. Insomnia  Due to #1  Will order Flexeril  qhs  4. Anxiety  Cont Lexapro

## 2016-02-01 ENCOUNTER — Telehealth: Payer: Self-pay | Admitting: *Deleted

## 2016-02-01 NOTE — Telephone Encounter (Signed)
Received pt signed release of information form via fax from Herby AbrahamJennifer Willingham of [employer]. After reviewing pt's chart, I informed Victorino DikeJennifer via fax [phone]) that the pt has not been seen in our office during the requested time frame. JG/CMA

## 2016-02-08 ENCOUNTER — Telehealth: Payer: Self-pay | Admitting: *Deleted

## 2016-02-08 ENCOUNTER — Other Ambulatory Visit: Payer: Self-pay | Admitting: Physical Medicine & Rehabilitation

## 2016-02-08 NOTE — Telephone Encounter (Signed)
Requested records printed. Restrictions form forwarded to Dr. Abner GreenspanBlyth

## 2016-02-08 NOTE — Telephone Encounter (Signed)
duplicate

## 2016-02-09 MED ORDER — OXYCODONE HCL 5 MG PO TABS: 5.0000 mg | ORAL_TABLET | Freq: Two times a day (BID) | ORAL | Status: DC | PRN

## 2016-02-09 NOTE — Telephone Encounter (Signed)
He may have a 1 month supply until his appointment 03/15/16, 5mg  BID PRN. Thank you.

## 2016-02-09 NOTE — Telephone Encounter (Signed)
Please advise on refill of Oxycodone 5mg . Thanks!

## 2016-02-10 ENCOUNTER — Telehealth: Payer: Self-pay | Admitting: Registered Nurse

## 2016-02-10 MED ORDER — OXYCODONE HCL 5 MG PO TABS: 5.0000 mg | ORAL_TABLET | Freq: Two times a day (BID) | ORAL | Status: DC | PRN

## 2016-02-10 NOTE — Telephone Encounter (Signed)
Prescription printed, per Dr. Allena KatzPatel Request  According to [facility]: Last  Oxycodone 5 mg was picked up on 02/10/16.

## 2016-02-10 NOTE — Telephone Encounter (Signed)
Yes please. Thanks. 

## 2016-02-10 NOTE — Telephone Encounter (Signed)
Ill print, if you sign?

## 2016-02-10 NOTE — Telephone Encounter (Signed)
Would you like me to print and ET to sign?

## 2016-02-10 NOTE — Telephone Encounter (Signed)
Rx printed and signed. Left message to make pt aware.

## 2016-02-20 NOTE — Telephone Encounter (Signed)
Records requested and [employer] completed [facility]at [employer] [phone]/[phone]/SLS 07/17

## 2016-02-29 ENCOUNTER — Telehealth: Payer: Self-pay | Admitting: *Deleted

## 2016-02-29 MED ORDER — GLIPIZIDE 10 MG PO TABS: 10.0000 mg | ORAL_TABLET | Freq: Two times a day (BID) | ORAL | 1 refills | Status: DC

## 2016-02-29 NOTE — Telephone Encounter (Signed)
Rx request to pharmacy/SLS  

## 2016-03-06 ENCOUNTER — Encounter: Payer: Self-pay | Admitting: Family Medicine

## 2016-03-06 ENCOUNTER — Other Ambulatory Visit: Payer: Self-pay | Admitting: Family Medicine

## 2016-03-06 MED ORDER — TRAZODONE HCL 50 MG PO TABS: 50.0000 mg | ORAL_TABLET | Freq: Every day | ORAL | 1 refills | Status: DC

## 2016-03-15 ENCOUNTER — Encounter: Payer: Self-pay | Admitting: Physical Medicine & Rehabilitation

## 2016-03-15 ENCOUNTER — Encounter: Payer: Self-pay | Attending: Physical Medicine & Rehabilitation | Admitting: Physical Medicine & Rehabilitation

## 2016-03-15 ENCOUNTER — Encounter: Payer: Self-pay | Admitting: Family Medicine

## 2016-03-15 VITALS — BP 132/90 | HR 96 | Resp 16

## 2016-03-15 DIAGNOSIS — Z79899 Other long term (current) drug therapy: Secondary | ICD-10-CM

## 2016-03-15 DIAGNOSIS — Z5181 Encounter for therapeutic drug level monitoring: Secondary | ICD-10-CM

## 2016-03-15 DIAGNOSIS — G4700 Insomnia, unspecified: Secondary | ICD-10-CM

## 2016-03-15 DIAGNOSIS — K219 Gastro-esophageal reflux disease without esophagitis: Secondary | ICD-10-CM | POA: Insufficient documentation

## 2016-03-15 DIAGNOSIS — I10 Essential (primary) hypertension: Secondary | ICD-10-CM | POA: Insufficient documentation

## 2016-03-15 DIAGNOSIS — F329 Major depressive disorder, single episode, unspecified: Secondary | ICD-10-CM | POA: Insufficient documentation

## 2016-03-15 DIAGNOSIS — M1711 Unilateral primary osteoarthritis, right knee: Secondary | ICD-10-CM

## 2016-03-15 DIAGNOSIS — M792 Neuralgia and neuritis, unspecified: Secondary | ICD-10-CM

## 2016-03-15 DIAGNOSIS — G894 Chronic pain syndrome: Secondary | ICD-10-CM

## 2016-03-15 DIAGNOSIS — R269 Unspecified abnormalities of gait and mobility: Secondary | ICD-10-CM

## 2016-03-15 DIAGNOSIS — G8929 Other chronic pain: Secondary | ICD-10-CM

## 2016-03-15 DIAGNOSIS — Z96651 Presence of right artificial knee joint: Secondary | ICD-10-CM | POA: Insufficient documentation

## 2016-03-15 DIAGNOSIS — M25561 Pain in right knee: Secondary | ICD-10-CM | POA: Insufficient documentation

## 2016-03-15 DIAGNOSIS — F17290 Nicotine dependence, other tobacco product, uncomplicated: Secondary | ICD-10-CM | POA: Insufficient documentation

## 2016-03-15 DIAGNOSIS — F419 Anxiety disorder, unspecified: Secondary | ICD-10-CM

## 2016-03-15 DIAGNOSIS — E119 Type 2 diabetes mellitus without complications: Secondary | ICD-10-CM | POA: Insufficient documentation

## 2016-03-15 DIAGNOSIS — Z87442 Personal history of urinary calculi: Secondary | ICD-10-CM | POA: Insufficient documentation

## 2016-03-15 MED ORDER — OXYCODONE HCL 5 MG PO TABS: 5.0000 mg | ORAL_TABLET | Freq: Every day | ORAL | 0 refills | Status: DC

## 2016-03-15 MED ORDER — AMITRIPTYLINE HCL 150 MG PO TABS
150.0000 mg | ORAL_TABLET | Freq: Every day | ORAL | 0 refills | Status: DC
Start: 2016-03-15 — End: 2016-09-13

## 2016-03-15 NOTE — Progress Notes (Signed)
Subjective:    Patient ID: Sean Horn, male    DOB: [birth year], 55 y.o.   MRN: [id number]  HPI  55 y/o with hx of anxiety, fairly well controlled non-insulin dependent DM, OA of right knee and psh of TKA x2 in right knee presents for follow up of pain in his right knee.  This was diagnosed by Ortho ~2012 with OA and most revision was ~summer 2015.  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  He is currently on disability.  Pain meds improve the pain, ice (helps), Voltaren gel (no real benefit), heat (with occasional benefits).  Prolonged postures, bending, squatting, steps exacerbate the pain.  Dull, achy, constant pain.  It occasionally radiates to his right hip.  The pain is interfering with his ability to work and enjoy activities, such as golf as well as his sleep.    Last visit was 12/14/15.  Today severity is 7/10 (due to the long drive).  He has trouble sleeping, his PCP ordered trazodone.  It does appear to be secondary to pain. He continues aquatic therapy, polar ice, Gabapentin, Cymbalta.  He is taking Oxycodone 10mg  now ~1/day, and trying to wean. He had his disability hearing last month and should know the verdict 1-2 months. He tried OTC pain patched, without much benefit. He tried Flexaril without benefit.  He has not seen his PCP for recent blood work.   Pain Inventory Average Pain 5 Pain Right Now 7 My pain is dull and aching  In the last 24 hours, has pain interfered with the following? General activity 8 Relation with others 3 Enjoyment of life 7 What TIME of day is your pain at its worst? constant, night Sleep (in general) Poor  Pain is worse with: walking, sitting and standing Pain improves with: rest and medication Relief from Meds: NA  Mobility use a cane how many minutes can you walk? 15 min ability to climb steps?  no do you drive?  yes  Function disabled: date disabled 2013  Neuro/Psych tremor depression anxiety  Prior  Studies Any changes since last visit?  no  Physicians involved in your care Any changes since last visit?  no   Family History  Problem Relation Age of Onset  . COPD Mother     smoker  . Hypertension Mother   . Heart disease Mother     CHF  . Diabetes Father   . Heart disease Father     quintuple bypass  . Stroke Father   . Hypertension Father   . Peripheral vascular disease Father   . Hypertension Son     tachycardia  . Heart disease Maternal Grandfather   . Arthritis Paternal Grandmother   . Colon cancer Neg Hx   . Rectal cancer Neg Hx   . Stomach cancer Neg Hx    Social History   Social History  . Marital status: Single    Spouse name: N/A  . Number of children: N/A  . Years of education: N/A   Social History Main Topics  . Smoking status: Never Smoker  . Smokeless tobacco: Current User    Types: Snuff  . Alcohol use Yes     Comment: occasionally  . Drug use: No  . Sexual activity: Yes     Comment: currently applying for disability due to knee, lives with girfriend, no dietary restrictions, previously Games developer   Other Topics Concern  . None   Social History Narrative  .  None   Past Surgical History:  Procedure Laterality Date  . CHOLECYSTECTOMY  1980's  . CYSTOSCOPY/RETROGRADE/URETEROSCOPY/STONE EXTRACTION WITH BASKET Right 08/27/2003   and double J stent placement  . INGUINAL HERNIA REPAIR Left 1990's  . KNEE ARTHROSCOPY Left 1980's  . KNEE ARTHROSCOPY Right 2013  . KNEE ARTHROSCOPY Right 06/30/2013   Procedure: RIGHT KNEE ARTHROSCOPY AND SYNOVECTOMY;  Surgeon: Velna Ochs, MD;  Location: [facility];  Service: Orthopedics;  Laterality: Right;  . LITHOTRIPSY     multiple, b/l  . SHOULDER ADHESION RELEASE Left ` 1998  . SHOULDER ARTHROSCOPY W/ ROTATOR CUFF REPAIR Left 10/20/2004   x 4, had to have part of clavile removed. torn rotator cuff multiple times, had to place screws   . SHOULDER ARTHROSCOPY W/ SUPERIOR LABRAL  ANTERIOR POSTERIOR LESION REPAIR Left 12/22/2004  . TOTAL KNEE ARTHROPLASTY Right 12/23/2012   x3, 2 arthroscopies and a TKR,  TOTAL KNEE ARTHROPLASTY;  Surgeon: Velna Ochs, MD;  Location: [facility];  Service: Orthopedics;  Laterality: Right;  DEPUY, RNFA  . WRIST SURGERY Right    x 3, torn ligaments, pins placed then infected, then fused with plate   Past Medical History:  Diagnosis Date  . Anxiety   . GERD (gastroesophageal reflux disease)    occasional - OTC as needed  . History of chicken pox   . History of kidney stones   . History of shingles   . Hypertension    under control with med., had been on med. since age 72  . Non-insulin dependent type 2 diabetes mellitus (HCC)   . Osteoarthritis    right knee  . PONV (postoperative nausea and vomiting)   . PONV (postoperative nausea and vomiting)   . Preventative health care 08/14/2013  . Tachycardia    BP 132/90   Pulse 96   Resp 16   SpO2 95%   Opioid Risk Score:   Fall Risk Score:  `1  Depression screen PHQ 2/9  Depression screen [facility] 2/9 03/15/2016 08/10/2015  Decreased Interest 0 2  Down, Depressed, Hopeless 0 3  PHQ - 2 Score 0 5  Altered sleeping - 3  Tired, decreased energy - 2  Change in appetite - 3  Feeling bad or failure about yourself  - 3  Trouble concentrating - 1  Moving slowly or fidgety/restless - 3  Suicidal thoughts - 0  PHQ-9 Score - 20  Difficult doing work/chores - Very difficult     Review of Systems  Neurological: Positive for tremors.  Psychiatric/Behavioral: Positive for dysphoric mood. The patient is nervous/anxious.   All other systems reviewed and are negative.     Objective:   Physical Exam HENT: Normocephalic, Atraumatic Eyes: EOMI, Conj WNL Cardio: S1, S2 normal, RRR Pulm: B/l clear to auscultation.  Effort normal Abd: Soft, non-distended, non-tender, BS+ MSK:   Gait Antalgic.             Mild TTP right medial knee.                Neg TTP b/l greater trochanter.                 Neg TTP lower back.               No atrophy.             No edema.   Neuro: CN II-XII grossly intact.  Sensation intact to light touch in all LE dermatomes             Reflexes 2+ throughout LE, except 1+ right knee             Strength          5/5 in all LE myotomes Skin: Warm and Dry.  Healer right knee surgical scar.    Assessment & Plan:  55 y/o with hx of anxiety, fairly well controlled non-insulin dependent DM, OA of right knee and psh of TKA x2 in right knee presents for follow up of pain in his right knee.  1. Right knee pain secondary to DJD, neuropathic pain, and post-op pain             S/p arthroscopy x2, TKA, and TKA revision (most recently ~May 2015)             Major complaint is lack of sleep due to pain and stiffness.             Pt has had PT after his most recent revision, but states his improvement plateaued.  He continues some of the home exercise programs, continue.               Pt has tried braces without significant benefit.    OTC Lidoderm ineffective              TENS unit provides minimal relief.                Cont aquatic therapy.      Flexaril d/ced due to lack of efficacy  Signed pain contract and obtained UDS, reviewed             Cont polar ice             Cont Gabapentin to 200 BID and 800 qHS             Cont Cymbalta 60mg  daily prescribed  Cont Oxycodone 1 day (now 5mg ), will cont to attempt to wean             Will consider Lidoderm patch after insurance approval             Will consider CPM machine trial after insurance             If ineffective, will also consider nerve block in future             Will consider Biowave after insurance approval if necessary at that time             Pt to bring in recent lab work, had not gone to PCP as of yet.   Pt awaiting verdict of SSD hearing   2. Abnormality of gait             Cont cane for safety  3. Insomnia  Partially due to #1  Will order Elavil 75mg , may increase to 150mg   after 2 weeks if incomplete efficacy and no side effects. Hold trazodone during trial   4. Anxiety  Cont Lexapro

## 2016-03-15 NOTE — Progress Notes (Deleted)
Subjective:     Patient ID: Sean Horn, male   DOB: 07/19/1961, 55 y.o.   MRN: [id number]  HPI Pain Inventory Average Pain 6 Pain Right Now 7 My pain is dull and aching  In the last 24 hours, has pain interfered with the following? General activity 8 Relation with others 3 Enjoyment of life 7 What TIME of day is your pain at its worst? constant  Sleep (in general) Poor  Pain is worse with: walking, bending, sitting and standing Pain improves with: rest and medication Relief from Meds: NA  Mobility use a cane how many minutes can you walk? 15 min ability to climb steps?  no do you drive?  yes  Function disabled: date disabled 2013  Neuro/Psych tremor depression anxiety  Prior Studies Any changes since last visit?  no  Physicians involved in your care Any changes since last visit?  no   Family History  Problem Relation Age of Onset  . COPD Mother     smoker  . Hypertension Mother   . Heart disease Mother     CHF  . Diabetes Father   . Heart disease Father     quintuple bypass  . Stroke Father   . Hypertension Father   . Peripheral vascular disease Father   . Hypertension Son     tachycardia  . Heart disease Maternal Grandfather   . Arthritis Paternal Grandmother   . Colon cancer Neg Hx   . Rectal cancer Neg Hx   . Stomach cancer Neg Hx    Social History   Social History  . Marital status: Single    Spouse name: N/A  . Number of children: N/A  . Years of education: N/A   Social History Main Topics  . Smoking status: Never Smoker  . Smokeless tobacco: Current User    Types: Snuff  . Alcohol use Yes     Comment: occasionally  . Drug use: No  . Sexual activity: Yes     Comment: currently applying for disability due to knee, lives with girfriend, no dietary restrictions, previously Games developerdiesel mechanic   Other Topics Concern  . None   Social History Narrative  . None   Past Surgical History:  Procedure Laterality Date  . CHOLECYSTECTOMY   1980's  . CYSTOSCOPY/RETROGRADE/URETEROSCOPY/STONE EXTRACTION WITH BASKET Right 08/27/2003   and double J stent placement  . INGUINAL HERNIA REPAIR Left 1990's  . KNEE ARTHROSCOPY Left 1980's  . KNEE ARTHROSCOPY Right 2013  . KNEE ARTHROSCOPY Right 06/30/2013   Procedure: RIGHT KNEE ARTHROSCOPY AND SYNOVECTOMY;  Surgeon: Velna OchsPeter G Dalldorf, MD;  Location: [facility];  Service: Orthopedics;  Laterality: Right;  . LITHOTRIPSY     multiple, b/l  . SHOULDER ADHESION RELEASE Left ` 1998  . SHOULDER ARTHROSCOPY W/ ROTATOR CUFF REPAIR Left 10/20/2004   x 4, had to have part of clavile removed. torn rotator cuff multiple times, had to place screws   . SHOULDER ARTHROSCOPY W/ SUPERIOR LABRAL ANTERIOR POSTERIOR LESION REPAIR Left 12/22/2004  . TOTAL KNEE ARTHROPLASTY Right 12/23/2012   x3, 2 arthroscopies and a TKR,  TOTAL KNEE ARTHROPLASTY;  Surgeon: Velna OchsPeter G Dalldorf, MD;  Location: [facility];  Service: Orthopedics;  Laterality: Right;  DEPUY, RNFA  . WRIST SURGERY Right    x 3, torn ligaments, pins placed then infected, then fused with plate   Past Medical History:  Diagnosis Date  . Anxiety   . GERD (gastroesophageal reflux disease)    occasional -  OTC as needed  . History of chicken pox   . History of kidney stones   . History of shingles   . Hypertension    under control with med., had been on med. since age 33  . Non-insulin dependent type 2 diabetes mellitus (HCC)   . Osteoarthritis    right knee  . PONV (postoperative nausea and vomiting)   . PONV (postoperative nausea and vomiting)   . Preventative health care 08/14/2013  . Tachycardia    BP 132/90   Pulse 96   Resp 16   SpO2 95%   Opioid Risk Score:   Fall Risk Score:  `1  Depression screen PHQ 2/9  Depression screen PHQ 2/9 08/10/2015  Decreased Interest 2  Down, Depressed, Hopeless 3  PHQ - 2 Score 5  Altered sleeping 3  Tired, decreased energy 2  Change in appetite 3  Feeling bad or failure about yourself  3   Trouble concentrating 1  Moving slowly or fidgety/restless 3  Suicidal thoughts 0  PHQ-9 Score 20  Difficult doing work/chores Very difficult    Review of Systems     Objective:   Physical Exam     Assessment:     ***    Plan:     ***

## 2016-03-25 LAB — TOXASSURE SELECT,+ANTIDEPR,UR: PDF: 0

## 2016-03-28 NOTE — Progress Notes (Signed)
Urine drug screen for this encounter is consistent for prescribed medications.   

## 2016-04-09 ENCOUNTER — Encounter: Payer: Self-pay | Admitting: Physical Medicine & Rehabilitation

## 2016-04-10 ENCOUNTER — Other Ambulatory Visit: Payer: Self-pay | Admitting: *Deleted

## 2016-04-10 MED ORDER — GABAPENTIN 800 MG PO TABS: 800.0000 mg | ORAL_TABLET | Freq: Every day | ORAL | 1 refills | Status: DC

## 2016-04-10 MED ORDER — DULOXETINE HCL 60 MG PO CPEP: 60.0000 mg | ORAL_CAPSULE | Freq: Every day | ORAL | 1 refills | Status: DC

## 2016-05-08 ENCOUNTER — Other Ambulatory Visit: Payer: Self-pay | Admitting: Family Medicine

## 2016-05-08 MED ORDER — ESCITALOPRAM OXALATE 20 MG PO TABS: 20.0000 mg | ORAL_TABLET | Freq: Every day | ORAL | 2 refills | Status: DC

## 2016-05-16 ENCOUNTER — Encounter: Payer: Self-pay | Attending: Physical Medicine & Rehabilitation | Admitting: Physical Medicine & Rehabilitation

## 2016-05-16 DIAGNOSIS — Z87442 Personal history of urinary calculi: Secondary | ICD-10-CM | POA: Insufficient documentation

## 2016-05-16 DIAGNOSIS — F329 Major depressive disorder, single episode, unspecified: Secondary | ICD-10-CM | POA: Insufficient documentation

## 2016-05-16 DIAGNOSIS — I10 Essential (primary) hypertension: Secondary | ICD-10-CM | POA: Insufficient documentation

## 2016-05-16 DIAGNOSIS — F17290 Nicotine dependence, other tobacco product, uncomplicated: Secondary | ICD-10-CM | POA: Insufficient documentation

## 2016-05-16 DIAGNOSIS — K219 Gastro-esophageal reflux disease without esophagitis: Secondary | ICD-10-CM | POA: Insufficient documentation

## 2016-05-16 DIAGNOSIS — E119 Type 2 diabetes mellitus without complications: Secondary | ICD-10-CM | POA: Insufficient documentation

## 2016-05-16 DIAGNOSIS — M1711 Unilateral primary osteoarthritis, right knee: Secondary | ICD-10-CM | POA: Insufficient documentation

## 2016-05-16 DIAGNOSIS — F419 Anxiety disorder, unspecified: Secondary | ICD-10-CM | POA: Insufficient documentation

## 2016-05-16 DIAGNOSIS — M25561 Pain in right knee: Secondary | ICD-10-CM | POA: Insufficient documentation

## 2016-05-16 DIAGNOSIS — Z96651 Presence of right artificial knee joint: Secondary | ICD-10-CM | POA: Insufficient documentation

## 2016-06-13 ENCOUNTER — Other Ambulatory Visit: Payer: Self-pay | Admitting: Family Medicine

## 2016-06-13 NOTE — Telephone Encounter (Signed)
Rx request to pharmacy/SLS  

## 2016-06-14 ENCOUNTER — Other Ambulatory Visit: Payer: Self-pay | Admitting: Physical Medicine & Rehabilitation

## 2016-06-14 ENCOUNTER — Other Ambulatory Visit: Payer: Self-pay | Admitting: Family Medicine

## 2016-06-15 NOTE — Telephone Encounter (Signed)
Cymbalta and Gabapentin re-filled. No more refills without an appointment.  Will have office staff schedule an appointment with Dr. Allena KatzPatel.

## 2016-08-15 ENCOUNTER — Other Ambulatory Visit: Payer: Self-pay | Admitting: Family Medicine

## 2016-08-15 NOTE — Telephone Encounter (Signed)
Received refill request.  Last Rf: 07/23/2016 Last Ov: 09/20/2015 Missed last scheduled OV for 03/19/2016. UDS: 12/17/14 controlled substance contract signed, no uds given,  Forwarded to Provider for review, approval or denial.

## 2016-08-15 NOTE — Telephone Encounter (Signed)
I refilled his Lexapro for the next 2 months but Feb will be a year since I saw him so he needs to come in for a follow up please reach out and let him know.

## 2016-08-17 ENCOUNTER — Telehealth: Payer: Self-pay

## 2016-08-17 NOTE — Telephone Encounter (Signed)
OK to give him a 90 day supply

## 2016-08-17 NOTE — Telephone Encounter (Signed)
Please schedule patient for annual exam  PC

## 2016-08-20 NOTE — Telephone Encounter (Signed)
Patient has a scheduled follow up for 09/13/2016,patient scheduled thru MyChart next available physical is not until July

## 2016-09-13 ENCOUNTER — Encounter: Payer: Self-pay | Admitting: Family Medicine

## 2016-09-13 ENCOUNTER — Ambulatory Visit (INDEPENDENT_AMBULATORY_CARE_PROVIDER_SITE_OTHER): Payer: Medicare HMO | Admitting: Family Medicine

## 2016-09-13 VITALS — BP 138/88 | HR 87 | Temp 98.7°F | Ht 72.0 in | Wt 223.6 lb

## 2016-09-13 DIAGNOSIS — E782 Mixed hyperlipidemia: Secondary | ICD-10-CM | POA: Diagnosis not present

## 2016-09-13 DIAGNOSIS — G4700 Insomnia, unspecified: Secondary | ICD-10-CM | POA: Diagnosis not present

## 2016-09-13 DIAGNOSIS — E119 Type 2 diabetes mellitus without complications: Secondary | ICD-10-CM

## 2016-09-13 DIAGNOSIS — I10 Essential (primary) hypertension: Secondary | ICD-10-CM

## 2016-09-13 DIAGNOSIS — M1711 Unilateral primary osteoarthritis, right knee: Secondary | ICD-10-CM | POA: Diagnosis not present

## 2016-09-13 HISTORY — DX: Insomnia, unspecified: G47.00

## 2016-09-13 MED ORDER — DULOXETINE HCL 60 MG PO CPEP: 60.0000 mg | ORAL_CAPSULE | Freq: Every day | ORAL | 5 refills | Status: DC

## 2016-09-13 MED ORDER — TRAZODONE HCL 100 MG PO TABS: 100.0000 mg | ORAL_TABLET | Freq: Every day | ORAL | 3 refills | Status: DC

## 2016-09-13 MED ORDER — INSULIN LISPRO 100 UNIT/ML ~~LOC~~ SOLN: 10.0000 [IU] | Freq: Three times a day (TID) | SUBCUTANEOUS | 11 refills | Status: DC

## 2016-09-13 NOTE — Progress Notes (Signed)
Patient ID: Sean Horn, male   DOB: 12/02/1960, 56 y.o.   MRN: [id number]   Subjective:    Patient ID: Sean KneeCharles W Horn, male    DOB: 06/23/1961, 56 y.o.   MRN: [id number]  Chief Complaint  Patient presents with  . Follow-up  . Hypertension  . Diabetes  I acted as a Neurosurgeonscribe for Dr. Abner GreenspanBlyth. Princess, RMA   Hypertension  This is a chronic problem. The current episode started more than 1 year ago. The problem is controlled. Pertinent negatives include no blurred vision, chest pain, headaches, malaise/fatigue, palpitations or shortness of breath. The current treatment provides mild improvement.  Diabetes  He presents for his follow-up diabetic visit. He has type 2 diabetes mellitus. Pertinent negatives for hypoglycemia include no dizziness, headaches or nervousness/anxiousness. Pertinent negatives for diabetes include no blurred vision and no chest pain.    Patient is in today for follow up for hypertension, diabetes type 2 and other chronic conditions. Does not complain of polyria or polydipsia. No recent febrile illness or recent hospitalization. Denies CP/palp/SOB/HA/congestion/fevers/GI or GU c/o. Taking meds as prescribed. Has his disability in place now and knee is better since last surgery. Sugars have begun spiking to above 300 just in past couple of weeks but he denies changing his diet or eating more carbs. Not exercising. Notes his trazodone is also not working as well and he wakes up frequently at night.  Past Medical History:  Diagnosis Date  . Anxiety   . GERD (gastroesophageal reflux disease)    occasional - OTC as needed  . History of chicken pox   . History of kidney stones   . History of shingles   . Hypertension    under control with med., had been on med. since age 56  . Insomnia 09/13/2016  . Non-insulin dependent type 2 diabetes mellitus (HCC)   . Osteoarthritis    right knee  . PONV (postoperative nausea and vomiting)   . PONV (postoperative nausea and vomiting)    . Preventative health care 08/14/2013  . Tachycardia     Past Surgical History:  Procedure Laterality Date  . CHOLECYSTECTOMY  1980's  . CYSTOSCOPY/RETROGRADE/URETEROSCOPY/STONE EXTRACTION WITH BASKET Right 08/27/2003   and double J stent placement  . INGUINAL HERNIA REPAIR Left 1990's  . KNEE ARTHROSCOPY Left 1980's  . KNEE ARTHROSCOPY Right 2013  . KNEE ARTHROSCOPY Right 06/30/2013   Procedure: RIGHT KNEE ARTHROSCOPY AND SYNOVECTOMY;  Surgeon: Velna OchsPeter G Dalldorf, MD;  Location: [facility];  Service: Orthopedics;  Laterality: Right;  . LITHOTRIPSY     multiple, b/l  . SHOULDER ADHESION RELEASE Left ` 1998  . SHOULDER ARTHROSCOPY W/ ROTATOR CUFF REPAIR Left 10/20/2004   x 4, had to have part of clavile removed. torn rotator cuff multiple times, had to place screws   . SHOULDER ARTHROSCOPY W/ SUPERIOR LABRAL ANTERIOR POSTERIOR LESION REPAIR Left 12/22/2004  . TOTAL KNEE ARTHROPLASTY Right 12/23/2012   x3, 2 arthroscopies and a TKR,  TOTAL KNEE ARTHROPLASTY;  Surgeon: Velna OchsPeter G Dalldorf, MD;  Location: [facility];  Service: Orthopedics;  Laterality: Right;  DEPUY, RNFA  . WRIST SURGERY Right    x 3, torn ligaments, pins placed then infected, then fused with plate    Family History  Problem Relation Age of Onset  . COPD Mother     smoker  . Hypertension Mother   . Heart disease Mother     CHF  . Diabetes Father   . Heart disease Father  quintuple bypass  . Stroke Father   . Hypertension Father   . Peripheral vascular disease Father   . Hypertension Son     tachycardia  . Heart disease Maternal Grandfather   . Arthritis Paternal Grandmother   . Colon cancer Neg Hx   . Rectal cancer Neg Hx   . Stomach cancer Neg Hx     Social History   Social History  . Marital status: Single    Spouse name: N/A  . Number of children: N/A  . Years of education: N/A   Occupational History  . Not on file.   Social History Main Topics  . Smoking status: Never Smoker  .  Smokeless tobacco: Current User    Types: Snuff  . Alcohol use Yes     Comment: occasionally  . Drug use: No  . Sexual activity: Yes     Comment: currently applying for disability due to knee, lives with girfriend, no dietary restrictions, previously Games developer   Other Topics Concern  . Not on file   Social History Narrative  . No narrative on file    Outpatient Medications Prior to Visit  Medication Sig Dispense Refill  . carvedilol (COREG) 12.5 MG tablet Take 1 tablet (12.5 mg total) by mouth 2 (two) times daily with a meal. 60 tablet 6  . glipiZIDE (GLUCOTROL) 10 MG tablet Take 1 tablet (10 mg total) by mouth 2 (two) times daily before a meal. 180 tablet 1  . lansoprazole (PREVACID) 30 MG capsule Take 30 mg by mouth daily at 12 noon.    Marland Kitchen lisinopril-hydrochlorothiazide (PRINZIDE,ZESTORETIC) 10-12.5 MG per tablet Take 1 tablet by mouth daily. 30 tablet 5  . metFORMIN (GLUCOPHAGE) 1000 MG tablet TAKE ONE TABLET BY MOUTH TWICE DAILY WITH MEALS 60 tablet 3  . RA KRILL OIL 500 MG CAPS Take 1 capsule by mouth daily.    . Turmeric 500 MG CAPS Take 1 capsule by mouth 2 (two) times daily.    . DULoxetine (CYMBALTA) 60 MG capsule TAKE ONE CAPSULE BY MOUTH ONCE DAILY 30 capsule 0  . insulin lispro (HUMALOG) 100 UNIT/ML injection Inject 0.02-0.04 mLs (2-4 Units total) into the skin 3 (three) times daily before meals. 10 mL 11  . traZODone (DESYREL) 50 MG tablet TAKE ONE TABLET BY MOUTH ONCE DAILY AT BEDTIME 30 tablet 1  . canagliflozin (INVOKANA) 300 MG TABS tablet Take 1 tablet (300 mg total) by mouth daily before breakfast. 30 tablet 0  . escitalopram (LEXAPRO) 20 MG tablet TAKE ONE TABLET BY MOUTH DAILY (Patient not taking: Reported on 09/13/2016) 30 tablet 2  . fenofibrate 160 MG tablet Take 1 tablet (160 mg total) by mouth daily. (Patient not taking: Reported on 09/13/2016) 30 tablet 8  . oxyCODONE (ROXICODONE) 5 MG immediate release tablet Take 1 tablet (5 mg total) by mouth daily.  (Patient not taking: Reported on 09/13/2016) 30 tablet 0  . amitriptyline (ELAVIL) 150 MG tablet Take 1 tablet (150 mg total) by mouth at bedtime. Will order Elavil 75mg , may increase to 150mg  after 2 weeks if incomplete efficacy and no side effects (Patient not taking: Reported on 09/13/2016) 30 tablet 0  . gabapentin (NEURONTIN) 100 MG capsule Take 2 capsules (200 mg total) by mouth 2 (two) times daily. (Patient not taking: Reported on 09/13/2016) 120 capsule 1  . gabapentin (NEURONTIN) 800 MG tablet TAKE ONE TABLET BY MOUTH ONCE DAILY AT BEDTIME (Patient not taking: Reported on 09/13/2016) 30 tablet 0   No facility-administered medications  prior to visit.     Allergies  Allergen Reactions  . Toradol [Ketorolac Tromethamine] Itching    Review of Systems  Constitutional: Negative for fever and malaise/fatigue.  HENT: Negative for congestion.   Eyes: Negative for blurred vision.  Respiratory: Negative for shortness of breath.   Cardiovascular: Negative for chest pain, palpitations and leg swelling.  Gastrointestinal: Negative for abdominal pain, blood in stool and nausea.  Genitourinary: Negative for dysuria and frequency.  Musculoskeletal: Negative for falls.  Skin: Negative for rash.  Neurological: Negative for dizziness, loss of consciousness and headaches.  Endo/Heme/Allergies: Negative for environmental allergies.  Psychiatric/Behavioral: Negative for depression. The patient is not nervous/anxious.        Objective:    Physical Exam  Constitutional: He is oriented to person, place, and time. He appears well-developed and well-nourished. No distress.  HENT:  Head: Normocephalic and atraumatic.  Nose: Nose normal.  Eyes: Right eye exhibits no discharge. Left eye exhibits no discharge.  Neck: Normal range of motion. Neck supple.  Cardiovascular: Normal rate and regular rhythm.   No murmur heard. Pulmonary/Chest: Effort normal and breath sounds normal.  Abdominal: Soft. Bowel sounds  are normal. There is no tenderness.  Musculoskeletal: He exhibits no edema.  Neurological: He is alert and oriented to person, place, and time.  Skin: Skin is warm and dry.  Psychiatric: He has a normal mood and affect.  Nursing note and vitals reviewed.   BP 138/88 (BP Location: Right Arm, Patient Position: Sitting, Cuff Size: Normal)   Pulse 87   Temp 98.7 F (37.1 C) (Oral)   Ht 6' (1.829 m)   Wt 223 lb 9.6 oz (101.4 kg)   SpO2 97%   BMI 30.33 kg/m  Wt Readings from Last 3 Encounters:  09/13/16 223 lb 9.6 oz (101.4 kg)  09/20/15 217 lb 8 oz (98.7 kg)  06/02/15 215 lb (97.5 kg)      Lab Results  Component Value Date   WBC 7.8 05/26/2015   HGB 16.9 05/26/2015   HCT 49.5 05/26/2015   PLT 280.0 05/26/2015   GLUCOSE 190 (H) 05/26/2015   CHOL 197 05/26/2015   TRIG 280.0 (H) 05/26/2015   HDL 38.20 (L) 05/26/2015   LDLDIRECT 132.0 05/26/2015   ALT 28 05/26/2015   AST 22 05/26/2015   NA 137 05/26/2015   K 3.8 05/26/2015   CL 98 05/26/2015   CREATININE 1.18 05/26/2015   BUN 13 05/26/2015   CO2 29 05/26/2015   TSH 3.49 05/26/2015   INR 1.03 12/16/2012   HGBA1C 7.3 (H) 05/26/2015    Lab Results  Component Value Date   TSH 3.49 05/26/2015   Lab Results  Component Value Date   WBC 7.8 05/26/2015   HGB 16.9 05/26/2015   HCT 49.5 05/26/2015   MCV 92.2 05/26/2015   PLT 280.0 05/26/2015   Lab Results  Component Value Date   NA 137 05/26/2015   K 3.8 05/26/2015   CO2 29 05/26/2015   GLUCOSE 190 (H) 05/26/2015   BUN 13 05/26/2015   CREATININE 1.18 05/26/2015   BILITOT 1.4 (H) 05/26/2015   ALKPHOS 49 05/26/2015   AST 22 05/26/2015   ALT 28 05/26/2015   PROT 7.4 05/26/2015   ALBUMIN 4.5 05/26/2015   CALCIUM 9.6 05/26/2015   GFR 68.40 05/26/2015   Lab Results  Component Value Date   CHOL 197 05/26/2015   Lab Results  Component Value Date   HDL 38.20 (L) 05/26/2015   No results found for:  LDLCALC Lab Results  Component Value Date   TRIG 280.0 (H)  05/26/2015   Lab Results  Component Value Date   CHOLHDL 5 05/26/2015   Lab Results  Component Value Date   HGBA1C 7.3 (H) 05/26/2015       Assessment & Plan:   Problem List Items Addressed This Visit      Chronic   Right knee DJD    Improved after last surgery, has finally gotten his disability squared away. At this time time is taking very few pain meds. Uses OTC Tylenol and Advil sparingly with good results. Encouraged to consider adding Lidocaine gel prn, stay as active as tolerated        Other   Non-insulin dependent type 2 diabetes mellitus (HCC)    hgba1c acceptable but he notes in past couple of weeks sugars have begun to spike above 300 at times. minimize simple carbs. Increase exercise as tolerated. Increase Humalog to 6 units tid and then increase to 10 units tid as needed.      Relevant Medications   insulin lispro (HUMALOG) 100 UNIT/ML injection   Other Relevant Orders   Hemoglobin A1c   Hypertension    Well controlled, no changes to meds. Encouraged heart healthy diet such as the DASH diet and exercise as tolerated.       Relevant Orders   CBC   Comprehensive metabolic panel   TSH   Insomnia    Encouraged good sleep hygiene such as dark, quiet room. No blue/green glowing lights such as computer screens in bedroom. No alcohol or stimulants in evening. Cut down on caffeine as able. Regular exercise is helpful but not just prior to bed time. Increase Trazodone and reassess       Other Visit Diagnoses    Hyperlipidemia, mixed    -  Primary   Relevant Orders   Lipid panel      I have discontinued Mr. Russman gabapentin, amitriptyline, traZODone, and gabapentin. I have also changed his DULoxetine and insulin lispro. Additionally, I am having him start on traZODone. Lastly, I am having him maintain his RA KRILL OIL, lansoprazole, Turmeric, lisinopril-hydrochlorothiazide, fenofibrate, carvedilol, canagliflozin, glipiZIDE, oxyCODONE, metFORMIN, and  escitalopram.  Meds ordered this encounter  Medications  . DULoxetine (CYMBALTA) 60 MG capsule    Sig: Take 1 capsule (60 mg total) by mouth daily.    Dispense:  30 capsule    Refill:  5  . traZODone (DESYREL) 100 MG tablet    Sig: Take 1 tablet (100 mg total) by mouth at bedtime.    Dispense:  30 tablet    Refill:  3  . insulin lispro (HUMALOG) 100 UNIT/ML injection    Sig: Inject 0.1 mLs (10 Units total) into the skin 3 (three) times daily before meals.    Dispense:  120 mL    Refill:  11    CMA served as scribe during this visit. History, Physical and Plan performed by medical provider. Documentation and orders reviewed and attested to.  Danise Edge, MD

## 2016-09-13 NOTE — Progress Notes (Signed)
Pre visit review using our clinic review tool, if applicable. No additional management support is needed unless otherwise documented below in the visit note. 

## 2016-09-13 NOTE — Assessment & Plan Note (Addendum)
hgba1c acceptable but he notes in past couple of weeks sugars have begun to spike above 300 at times. minimize simple carbs. Increase exercise as tolerated. Increase Humalog to 6 units tid and then increase to 10 units tid as needed.

## 2016-09-13 NOTE — Assessment & Plan Note (Signed)
Well controlled, no changes to meds. Encouraged heart healthy diet such as the DASH diet and exercise as tolerated.  °

## 2016-09-13 NOTE — Assessment & Plan Note (Signed)
Encouraged good sleep hygiene such as dark, quiet room. No blue/green glowing lights such as computer screens in bedroom. No alcohol or stimulants in evening. Cut down on caffeine as able. Regular exercise is helpful but not just prior to bed time. Increase Trazodone and reassess

## 2016-09-13 NOTE — Patient Instructions (Signed)
Carbohydrate Counting for Diabetes Mellitus, Adult Carbohydrate counting is a method for keeping track of how many carbohydrates you eat. Eating carbohydrates naturally increases the amount of sugar (glucose) in the blood. Counting how many carbohydrates you eat helps keep your blood glucose within normal limits, which helps you manage your diabetes (diabetes mellitus). It is important to know how many carbohydrates you can safely have in each meal. This is different for every person. A diet and nutrition specialist (registered dietitian) can help you make a meal plan and calculate how many carbohydrates you should have at each meal and snack. Carbohydrates are found in the following foods:  Grains, such as breads and cereals.  Dried beans and soy products.  Starchy vegetables, such as potatoes, peas, and corn.  Fruit and fruit juices.  Milk and yogurt.  Sweets and snack foods, such as cake, cookies, candy, chips, and soft drinks. How do I count carbohydrates? There are two ways to count carbohydrates in food. You can use either of the methods or a combination of both. Reading "Nutrition Facts" on packaged food  The "Nutrition Facts" list is included on the labels of almost all packaged foods and beverages in the [place] It includes:  The serving size.  Information about nutrients in each serving, including the grams (g) of carbohydrate per serving. To use the "Nutrition Facts":  Decide how many servings you will have.  Multiply the number of servings by the number of carbohydrates per serving.  The resulting number is the total amount of carbohydrates that you will be having. Learning standard serving sizes of other foods  When you eat foods containing carbohydrates that are not packaged or do not include "Nutrition Facts" on the label, you need to measure the servings in order to count the amount of carbohydrates:  Measure the foods that you will eat with a food scale or measuring  cup, if needed.  Decide how many standard-size servings you will eat.  Multiply the number of servings by 15. Most carbohydrate-rich foods have about 15 g of carbohydrates per serving.  For example, if you eat 8 oz (170 g) of strawberries, you will have eaten 2 servings and 30 g of carbohydrates (2 servings x 15 g = 30 g).  For foods that have more than one food mixed, such as soups and casseroles, you must count the carbohydrates in each food that is included. The following list contains standard serving sizes of common carbohydrate-rich foods. Each of these servings has about 15 g of carbohydrates:   hamburger bun or  English muffin.   oz (15 mL) syrup.   oz (14 g) jelly.  1 slice of bread.  1 six-inch tortilla.  3 oz (85 g) cooked rice or pasta.  4 oz (113 g) cooked dried beans.  4 oz (113 g) starchy vegetable, such as peas, corn, or potatoes.  4 oz (113 g) hot cereal.  4 oz (113 g) mashed potatoes or  of a large baked potato.  4 oz (113 g) canned or frozen fruit.  4 oz (120 mL) fruit juice.  4-6 crackers.  6 chicken nuggets.  6 oz (170 g) unsweetened dry cereal.  6 oz (170 g) plain fat-free yogurt or yogurt sweetened with artificial sweeteners.  8 oz (240 mL) milk.  8 oz (170 g) fresh fruit or one small piece of fruit.  24 oz (680 g) popped popcorn. Example of carbohydrate counting Sample meal  3 oz (85 g) chicken breast.  6 oz (  170 g) brown rice.  4 oz (113 g) corn.  8 oz (240 mL) milk.  8 oz (170 g) strawberries with sugar-free whipped topping. Carbohydrate calculation 1. Identify the foods that contain carbohydrates:  Rice.  Corn.  Milk.  Strawberries. 2. Calculate how many servings you have of each food:  2 servings rice.  1 serving corn.  1 serving milk.  1 serving strawberries. 3. Multiply each number of servings by 15 g:  2 servings rice x 15 g = 30 g.  1 serving corn x 15 g = 15 g.  1 serving milk x 15 g = 15  g.  1 serving strawberries x 15 g = 15 g. 4. Add together all of the amounts to find the total grams of carbohydrates eaten:  30 g + 15 g + 15 g + 15 g = 75 g of carbohydrates total. This information is not intended to replace advice given to you by your health care provider. Make sure you discuss any questions you have with your health care provider. Document Released: 07/23/2005 Document Revised: 02/10/2016 Document Reviewed: 01/04/2016 Elsevier Interactive Patient Education  2017 [employer]  

## 2016-09-13 NOTE — Assessment & Plan Note (Signed)
Improved after last surgery, has finally gotten his disability squared away. At this time time is taking very few pain meds. Uses OTC Tylenol and Advil sparingly with good results. Encouraged to consider adding Lidocaine gel prn, stay as active as tolerated

## 2016-09-14 LAB — CBC
HCT: 46.4 % (ref 39.0–52.0)
HEMOGLOBIN: 16.4 g/dL (ref 13.0–17.0)
MCHC: 35.3 g/dL (ref 30.0–36.0)
MCV: 91.4 fl (ref 78.0–100.0)
Platelets: 292 10*3/uL (ref 150.0–400.0)
RBC: 5.08 Mil/uL (ref 4.22–5.81)
RDW: 13.5 % (ref 11.5–15.5)
WBC: 8.1 10*3/uL (ref 4.0–10.5)

## 2016-09-14 LAB — LIPID PANEL
Cholesterol: 184 mg/dL (ref 0–200)
HDL: 37.4 mg/dL — AB (ref >39.00)
Total CHOL/HDL Ratio: 5
Triglycerides: 652 mg/dL — ABNORMAL HIGH (ref 0.0–149.0)

## 2016-09-14 LAB — COMPREHENSIVE METABOLIC PANEL
ALBUMIN: 4.4 g/dL (ref 3.5–5.2)
ALK PHOS: 54 U/L (ref 39–117)
ALT: 27 U/L (ref 0–53)
AST: 18 U/L (ref 0–37)
BILIRUBIN TOTAL: 1.5 mg/dL — AB (ref 0.2–1.2)
BUN: 11 mg/dL (ref 6–23)
CO2: 27 mEq/L (ref 19–32)
Calcium: 9 mg/dL (ref 8.4–10.5)
Chloride: 101 mEq/L (ref 96–112)
Creatinine, Ser: 0.9 mg/dL (ref 0.40–1.50)
GFR: 93.05 mL/min (ref >60.00)
GLUCOSE: 194 mg/dL — AB (ref 70–99)
Potassium: 4.1 mEq/L (ref 3.5–5.1)
Sodium: 134 mEq/L — ABNORMAL LOW (ref 135–145)
TOTAL PROTEIN: 6.9 g/dL (ref 6.0–8.3)

## 2016-09-14 LAB — TSH: TSH: 1.85 u[IU]/mL (ref 0.35–4.50)

## 2016-09-14 LAB — LDL CHOLESTEROL, DIRECT: Direct LDL: 89 mg/dL

## 2016-09-14 LAB — HEMOGLOBIN A1C: HEMOGLOBIN A1C: 9.7 % — AB (ref 4.6–6.5)

## 2016-09-17 ENCOUNTER — Other Ambulatory Visit: Payer: Self-pay | Admitting: Family Medicine

## 2016-09-17 DIAGNOSIS — E785 Hyperlipidemia, unspecified: Secondary | ICD-10-CM

## 2016-09-17 MED ORDER — CANAGLIFLOZIN 300 MG PO TABS: 300.0000 mg | ORAL_TABLET | Freq: Every day | ORAL | 3 refills | Status: DC

## 2016-09-17 MED ORDER — ROSUVASTATIN CALCIUM 10 MG PO TABS: 10.0000 mg | ORAL_TABLET | Freq: Every day | ORAL | 3 refills | Status: DC

## 2016-10-30 ENCOUNTER — Encounter: Payer: Self-pay | Admitting: Family Medicine

## 2016-10-31 ENCOUNTER — Encounter: Payer: Self-pay | Admitting: Family Medicine

## 2016-10-31 MED ORDER — OXYCODONE HCL 5 MG PO TABS: 5.0000 mg | ORAL_TABLET | Freq: Every day | ORAL | 0 refills | Status: DC

## 2016-11-08 ENCOUNTER — Other Ambulatory Visit: Payer: Self-pay | Admitting: Family Medicine

## 2016-11-08 MED ORDER — CANAGLIFLOZIN 300 MG PO TABS: 300.0000 mg | ORAL_TABLET | Freq: Every day | ORAL | 0 refills | Status: DC

## 2016-11-08 MED ORDER — ROSUVASTATIN CALCIUM 10 MG PO TABS: 10.0000 mg | ORAL_TABLET | Freq: Every day | ORAL | 0 refills | Status: DC

## 2016-11-28 ENCOUNTER — Other Ambulatory Visit: Payer: Self-pay | Admitting: Family Medicine

## 2016-12-11 ENCOUNTER — Encounter: Payer: Self-pay | Admitting: Family Medicine

## 2016-12-11 ENCOUNTER — Ambulatory Visit (INDEPENDENT_AMBULATORY_CARE_PROVIDER_SITE_OTHER): Payer: Medicare HMO | Admitting: Family Medicine

## 2016-12-11 VITALS — BP 131/87 | HR 83 | Temp 98.0°F | Wt 218.6 lb

## 2016-12-11 DIAGNOSIS — IMO0001 Reserved for inherently not codable concepts without codable children: Secondary | ICD-10-CM

## 2016-12-11 DIAGNOSIS — R350 Frequency of micturition: Secondary | ICD-10-CM | POA: Diagnosis not present

## 2016-12-11 DIAGNOSIS — M1711 Unilateral primary osteoarthritis, right knee: Secondary | ICD-10-CM | POA: Diagnosis not present

## 2016-12-11 DIAGNOSIS — E119 Type 2 diabetes mellitus without complications: Secondary | ICD-10-CM | POA: Diagnosis not present

## 2016-12-11 DIAGNOSIS — Z794 Long term (current) use of insulin: Secondary | ICD-10-CM | POA: Diagnosis not present

## 2016-12-11 DIAGNOSIS — I10 Essential (primary) hypertension: Secondary | ICD-10-CM

## 2016-12-11 DIAGNOSIS — E782 Mixed hyperlipidemia: Secondary | ICD-10-CM

## 2016-12-11 HISTORY — DX: Frequency of micturition: R35.0

## 2016-12-11 MED ORDER — INSULIN DETEMIR 100 UNIT/ML ~~LOC~~ SOLN: 20.0000 [IU] | Freq: Every day | SUBCUTANEOUS | 3 refills | Status: DC

## 2016-12-11 MED ORDER — CARVEDILOL 12.5 MG PO TABS: 12.5000 mg | ORAL_TABLET | Freq: Two times a day (BID) | ORAL | 6 refills | Status: DC

## 2016-12-11 NOTE — Assessment & Plan Note (Signed)
s/p  Right TKR and doing much better

## 2016-12-11 NOTE — Assessment & Plan Note (Signed)
Check a PSA, UA with c&s,

## 2016-12-11 NOTE — Progress Notes (Signed)
Dictation #1 [id number]  [id number] Patient ID: Sean Horn, male   DOB: 12-05-1960, 56 y.o.   MRN: [id number]   Subjective:  I acted as a Neurosurgeon for Danise Edge, MD. [place], [place]   Patient ID: Sean Horn, male    DOB: Oct 09, 1960, 56 y.o.   MRN: [id number]  Chief Complaint  Patient presents with  . Hyperlipidemia    37-month F/U.  Marland Kitchen Urinary Frequency    Up to 10 times per day for the past two months.    Hyperlipidemia  This is a chronic problem. The problem is controlled. Pertinent negatives include no chest pain or shortness of breath.  Urinary Frequency   This is a new problem. The current episode started more than 1 month ago. The problem occurs intermittently. The problem has been unchanged. There has been no fever. There is no history of pyelonephritis. Associated symptoms include frequency. Pertinent negatives include no vomiting.    Patient is in today for a 16-month follow up for hyperlipidemia. Patient also states that he has been having urinary Frequency for the past two months; up to 10 times per day. Patient checked his blood sugar this morning with a reading of 148(fasting). Patient has a Hx of hyperlipidemia, HTN, GERD, anxiety, osteoarthritis, insomnia. Patient has no additional acute concerns noted at this time. No recent febrile illness or hospitalizations. Denies CP/palp/SOB/HA/congestion/fevers/GI or c/o. Taking meds as prescribed  Patient Care Team: Bradd Canary, MD as PCP - General (Family Medicine)   Past Medical History:  Diagnosis Date  . Anxiety   . GERD (gastroesophageal reflux disease)    occasional - OTC as needed  . History of chicken pox   . History of kidney stones   . History of shingles   . Hypertension    under control with med., had been on med. since age 53  . Insomnia 09/13/2016  . Insulin dependent diabetes mellitus (HCC)    Follows with endocrinology, Dr Chestine Spore reports his last hgba1c was 7.3   . Non-insulin dependent type  2 diabetes mellitus (HCC)   . Osteoarthritis    right knee  . PONV (postoperative nausea and vomiting)   . PONV (postoperative nausea and vomiting)   . Preventative health care 08/14/2013  . Tachycardia   . Urinary frequency 12/11/2016    Past Surgical History:  Procedure Laterality Date  . CHOLECYSTECTOMY  1980's  . CYSTOSCOPY/RETROGRADE/URETEROSCOPY/STONE EXTRACTION WITH BASKET Right 08/27/2003   and double J stent placement  . INGUINAL HERNIA REPAIR Left 1990's  . KNEE ARTHROSCOPY Left 1980's  . KNEE ARTHROSCOPY Right 2013  . KNEE ARTHROSCOPY Right 06/30/2013   Procedure: RIGHT KNEE ARTHROSCOPY AND SYNOVECTOMY;  Surgeon: Velna Ochs, MD;  [facility]:  [facility];  Service: Orthopedics;  Laterality: Right;  . LITHOTRIPSY     multiple, b/l  . SHOULDER ADHESION RELEASE Left ` 1998  . SHOULDER ARTHROSCOPY W/ ROTATOR CUFF REPAIR Left 10/20/2004   x 4, had to have part of clavile removed. torn rotator cuff multiple times, had to place screws   . SHOULDER ARTHROSCOPY W/ SUPERIOR LABRAL ANTERIOR POSTERIOR LESION REPAIR Left 12/22/2004  . TOTAL KNEE ARTHROPLASTY Right 12/23/2012   x3, 2 arthroscopies and a TKR,  TOTAL KNEE ARTHROPLASTY;  Surgeon: Velna Ochs, MD;  Location: [facility] OR;  Service: Orthopedics;  Laterality: Right;  DEPUY, RNFA  . WRIST SURGERY Right    x 3, torn ligaments, pins placed then infected, then fused with plate    Family History  Problem Relation Age of Onset  . COPD Mother     smoker  . Hypertension Mother   . Heart disease Mother     CHF  . Diabetes Father   . Heart disease Father     quintuple bypass  . Stroke Father   . Hypertension Father   . Peripheral vascular disease Father   . Hypertension Son     tachycardia  . Heart disease Maternal Grandfather   . Arthritis Paternal Grandmother   . Colon cancer Neg Hx   . Rectal cancer Neg Hx   . Stomach cancer Neg Hx     Social History   Social History  . Marital status: Single     Spouse name: N/A  . Number of children: N/A  . Years of education: N/A   Occupational History  . Not on file.   Social History Main Topics  . Smoking status: Never Smoker  . Smokeless tobacco: Current User    Types: Snuff  . Alcohol use Yes     Comment: occasionally  . Drug use: No  . Sexual activity: Yes     Comment: currently applying for disability due to knee, lives with girfriend, no dietary restrictions, previously Games developer   Other Topics Concern  . Not on file   Social History Narrative  . No narrative on file    Outpatient Medications Prior to Visit  Medication Sig Dispense Refill  . DULoxetine (CYMBALTA) 60 MG capsule Take 1 capsule (60 mg total) by mouth daily. 30 capsule 5  . escitalopram (LEXAPRO) 20 MG tablet TAKE ONE TABLET BY MOUTH ONCE DAILY 30 tablet 2  . insulin lispro (HUMALOG) 100 UNIT/ML injection Inject 0.1 mLs (10 Units total) into the skin 3 (three) times daily before meals. 120 mL 11  . lansoprazole (PREVACID) 30 MG capsule Take 30 mg by mouth daily at 12 noon.    Marland Kitchen lisinopril-hydrochlorothiazide (PRINZIDE,ZESTORETIC) 10-12.5 MG per tablet Take 1 tablet by mouth daily. 30 tablet 5  . metFORMIN (GLUCOPHAGE) 1000 MG tablet TAKE ONE TABLET BY MOUTH TWICE DAILY WITH MEALS 60 tablet 3  . oxyCODONE (ROXICODONE) 5 MG immediate release tablet Take 1 tablet (5 mg total) by mouth daily. 30 tablet 0  . RA KRILL OIL 500 MG CAPS Take 1 capsule by mouth daily.    . rosuvastatin (CRESTOR) 10 MG tablet Take 1 tablet (10 mg total) by mouth daily. 90 tablet 0  . traZODone (DESYREL) 100 MG tablet Take 1 tablet (100 mg total) by mouth at bedtime. 30 tablet 3  . canagliflozin (INVOKANA) 300 MG TABS tablet Take 1 tablet (300 mg total) by mouth daily before breakfast. 90 tablet 0  . carvedilol (COREG) 12.5 MG tablet Take 1 tablet (12.5 mg total) by mouth 2 (two) times daily with a meal. 60 tablet 6  . glipiZIDE (GLUCOTROL) 10 MG tablet Take 1 tablet (10 mg total) by  mouth 2 (two) times daily before a meal. 180 tablet 1  . fenofibrate 160 MG tablet Take 1 tablet (160 mg total) by mouth daily. (Patient not taking: Reported on 12/11/2016) 30 tablet 8  . Turmeric 500 MG CAPS Take 1 capsule by mouth 2 (two) times daily.     No facility-administered medications prior to visit.     Allergies  Allergen Reactions  . Toradol [Ketorolac Tromethamine] Itching    Review of Systems  Constitutional: Negative for fever and malaise/fatigue.  HENT: Negative for congestion.   Eyes: Negative for blurred vision.  Respiratory: Negative for cough and shortness of breath.   Cardiovascular: Negative for chest pain, palpitations and leg swelling.  Gastrointestinal: Negative for vomiting.  Genitourinary: Positive for frequency.       10+ times per day.  Musculoskeletal: Positive for joint pain. Negative for back pain.  Skin: Negative for rash.  Neurological: Negative for loss of consciousness and headaches.       Objective:    Physical Exam  Constitutional: He is oriented to person, place, and time. He appears well-developed and well-nourished. No distress.  HENT:  Head: Normocephalic and atraumatic.  Eyes: Conjunctivae are normal.  Neck: Normal range of motion. No thyromegaly present.  Cardiovascular: Normal rate and regular rhythm.   Pulmonary/Chest: Effort normal and breath sounds normal. He has no wheezes.  Abdominal: Soft. Bowel sounds are normal. There is no tenderness.  Musculoskeletal: He exhibits no edema or deformity.  Neurological: He is alert and oriented to person, place, and time.  Skin: Skin is warm and dry. He is not diaphoretic.  Psychiatric: He has a normal mood and affect.    BP 131/87 (BP Location: Left Arm, Patient Position: Sitting, Cuff Size: Normal)   Pulse 83   Temp 98 F (36.7 C) (Oral)   Wt 218 lb 9.6 oz (99.2 kg)   SpO2 99% Comment: RA  BMI 29.65 kg/m  Wt Readings from Last 3 Encounters:  12/11/16 218 lb 9.6 oz (99.2 kg)    09/13/16 223 lb 9.6 oz (101.4 kg)  09/20/15 217 lb 8 oz (98.7 kg)   BP Readings from Last 3 Encounters:  12/11/16 131/87  09/13/16 138/88  03/15/16 132/90      There is no immunization history on file for this patient.  Health Maintenance  Topic Date Due  . Hepatitis C Screening  1961-01-13  . FOOT EXAM  07/10/1971  . OPHTHALMOLOGY EXAM  07/10/1971  . HIV Screening  07/09/1976  . COLONOSCOPY  07/10/2011  . PNEUMOCOCCAL POLYSACCHARIDE VACCINE (1) 03/13/2017 (Originally 07[birth year]10/1963)  . TETANUS/TDAP  03/13/2017 (Originally 07/09/1980)  . INFLUENZA VACCINE  03/06/2017  . HEMOGLOBIN A1C  03/13/2017    Lab Results  Component Value Date   WBC 7.5 12/11/2016   HGB 16.3 12/11/2016   HCT 46.9 12/11/2016   PLT 250.0 12/11/2016   GLUCOSE 122 (H) 12/11/2016   CHOL 200 12/11/2016   TRIG 269.0 (H) 12/11/2016   HDL 39.50 12/11/2016   LDLDIRECT 128.0 12/11/2016   ALT 22 12/11/2016   AST 19 12/11/2016   NA 136 12/11/2016   K 3.8 12/11/2016   CL 102 12/11/2016   CREATININE 0.99 12/11/2016   BUN 13 12/11/2016   CO2 26 12/11/2016   TSH 1.44 12/11/2016   PSA 0.58 12/11/2016   INR 1.03 12/16/2012   HGBA1C 9.8 (H) 12/11/2016    Lab Results  Component Value Date   TSH 1.44 12/11/2016   Lab Results  Component Value Date   WBC 7.5 12/11/2016   HGB 16.3 12/11/2016   HCT 46.9 12/11/2016   MCV 91.0 12/11/2016   PLT 250.0 12/11/2016   Lab Results  Component Value Date   NA 136 12/11/2016   K 3.8 12/11/2016   CO2 26 12/11/2016   GLUCOSE 122 (H) 12/11/2016   BUN 13 12/11/2016   CREATININE 0.99 12/11/2016   BILITOT 1.3 (H) 12/11/2016   ALKPHOS 59 12/11/2016   AST 19 12/11/2016   ALT 22 12/11/2016   PROT 6.8 12/11/2016   ALBUMIN 4.4 12/11/2016   CALCIUM 9.5 12/11/2016  GFR 83.29 12/11/2016   Lab Results  Component Value Date   CHOL 200 12/11/2016   Lab Results  Component Value Date   HDL 39.50 12/11/2016   No results found for: [facility] Lab Results  Component Value  Date   TRIG 269.0 (H) 12/11/2016   Lab Results  Component Value Date   CHOLHDL 5 12/11/2016   Lab Results  Component Value Date   HGBA1C 9.8 (H) 12/11/2016         Assessment & Plan:   Problem List Items Addressed This Visit      Chronic   Right knee DJD    s/p  Right TKR and doing much better        Other   Insulin dependent diabetes mellitus (HCC)    hgba1c unacceptable, minimize simple carbs. Increase exercise as tolerated. Continue Metformin and d/c other oral meds. Start Levemir 14 units and titrate up to 20 units as needed if numbers continue to run above 100 consistently      Relevant Medications   insulin detemir (LEVEMIR) 100 UNIT/ML injection   Hypertension    Well controlled, no changes to meds. Encouraged heart healthy diet such as the DASH diet and exercise as tolerated.       Relevant Medications   carvedilol (COREG) 12.5 MG tablet   Other Relevant Orders   CBC (Completed)   Comprehensive metabolic panel (Completed)   Lipid panel (Completed)   TSH (Completed)   Urinary frequency - Primary    Check a PSA, UA with c&s,       Relevant Orders   Urinalysis (Completed)   Urine Culture   PSA (Completed)      I have discontinued Mr. Delker Turmeric, fenofibrate, glipiZIDE, and canagliflozin. I am also having him start on insulin detemir. Additionally, I am having him maintain his RA KRILL OIL, lansoprazole, lisinopril-hydrochlorothiazide, DULoxetine, traZODone, insulin lispro, oxyCODONE, rosuvastatin, escitalopram, metFORMIN, and carvedilol.  Meds ordered this encounter  Medications  . carvedilol (COREG) 12.5 MG tablet    Sig: Take 1 tablet (12.5 mg total) by mouth 2 (two) times daily with a meal.    Dispense:  60 tablet    Refill:  6  . insulin detemir (LEVEMIR) 100 UNIT/ML injection    Sig: Inject 0.2 mLs (20 Units total) into the skin at bedtime.    Dispense:  10 mL    Refill:  3    .CMA served as Neurosurgeon during this visit. History,  Physical and Plan performed by medical provider. Documentation and orders reviewed and attested to.  Danise Edge, MD

## 2016-12-11 NOTE — Patient Instructions (Addendum)
Can increase insulin by 2 units every 3 days if all blood sugars are consistently above 100  Food Choices to Lower Your Triglycerides Triglycerides are a type of fat in your blood. High levels of triglycerides can increase the risk of heart disease and stroke. If your triglyceride levels are high, the foods you eat and your eating habits are very important. Choosing the right foods can help lower your triglycerides. What general guidelines do I need to follow?  Lose weight if you are overweight.  Limit or avoid alcohol.  Fill one half of your plate with vegetables and green salads.  Limit fruit to two servings a day. Choose fruit instead of juice.  Make one fourth of your plate whole grains. Look for the word "whole" as the first word in the ingredient list.  Fill one fourth of your plate with lean protein foods.  Enjoy fatty fish (such as salmon, mackerel, sardines, and tuna) three times a week.  Choose healthy fats.  Limit foods high in starch and sugar.  Eat more home-cooked food and less restaurant, buffet, and fast food.  Limit fried foods.  Cook foods using methods other than frying.  Limit saturated fats.  Check ingredient lists to avoid foods with partially hydrogenated oils (trans fats) in them. What foods can I eat? Grains  Whole grains, such as whole wheat or whole grain breads, crackers, cereals, and pasta. Unsweetened oatmeal, bulgur, barley, quinoa, or brown rice. Corn or whole wheat flour tortillas. Vegetables  Fresh or frozen vegetables (raw, steamed, roasted, or grilled). Green salads. Fruits  All fresh, canned (in natural juice), or frozen fruits. Meat and Other Protein Products  Ground beef (85% or leaner), grass-fed beef, or beef trimmed of fat. Skinless chicken or [place]. Ground chicken or [place]. Pork trimmed of fat. All fish and seafood. Eggs. Dried beans, peas, or lentils. Unsalted nuts or seeds. Unsalted canned or dry beans. Dairy  Low-fat dairy  products, such as skim or 1% milk, 2% or reduced-fat cheeses, low-fat ricotta or cottage cheese, or plain low-fat yogurt. Fats and Oils  Tub margarines without trans fats. Light or reduced-fat mayonnaise and salad dressings. Avocado. Safflower, olive, or canola oils. Natural peanut or almond butter. The items listed above may not be a complete list of recommended foods or beverages. Contact your dietitian for more options.  What foods are not recommended? Grains  White bread. White pasta. White rice. Cornbread. Bagels, pastries, and croissants. Crackers that contain trans fat. Vegetables  White potatoes. Corn. Creamed or fried vegetables. Vegetables in a cheese sauce. Fruits  Dried fruits. Canned fruit in light or heavy syrup. Fruit juice. Meat and Other Protein Products  Fatty cuts of meat. Ribs, chicken wings, bacon, sausage, bologna, salami, chitterlings, fatback, hot dogs, bratwurst, and packaged luncheon meats. Dairy  Whole or 2% milk, cream, half-and-half, and cream cheese. Whole-fat or sweetened yogurt. Full-fat cheeses. Nondairy creamers and whipped toppings. Processed cheese, cheese spreads, or cheese curds. Sweets and Desserts  Corn syrup, sugars, honey, and molasses. Candy. Jam and jelly. Syrup. Sweetened cereals. Cookies, pies, cakes, donuts, muffins, and ice cream. Fats and Oils  Butter, stick margarine, lard, shortening, ghee, or bacon fat. Coconut, palm kernel, or palm oils. Beverages  Alcohol. Sweetened drinks (such as sodas, lemonade, and fruit drinks or punches). The items listed above may not be a complete list of foods and beverages to avoid. Contact your dietitian for more information.  This information is not intended to replace advice given to you by  your health care provider. Make sure you discuss any questions you have with your health care provider. Document Released: 05/10/2004 Document Revised: 12/29/2015 Document Reviewed: 05/27/2013 Elsevier Interactive Patient  Education  2017 [employer].

## 2016-12-11 NOTE — Assessment & Plan Note (Addendum)
hgba1c unacceptable, minimize simple carbs. Increase exercise as tolerated. Continue Metformin and d/c other oral meds. Start Levemir 14 units and titrate up to 20 units as needed if numbers continue to run above 100 consistently

## 2016-12-11 NOTE — Progress Notes (Signed)
Pre visit review using our clinic review tool, if applicable. No additional management support is needed unless otherwise documented below in the visit note. 

## 2016-12-11 NOTE — Assessment & Plan Note (Signed)
Well controlled, no changes to meds. Encouraged heart healthy diet such as the DASH diet and exercise as tolerated.  °

## 2016-12-12 ENCOUNTER — Encounter: Payer: Self-pay | Admitting: Family Medicine

## 2016-12-12 LAB — COMPREHENSIVE METABOLIC PANEL
ALBUMIN: 4.4 g/dL (ref 3.5–5.2)
ALK PHOS: 59 U/L (ref 39–117)
ALT: 22 U/L (ref 0–53)
AST: 19 U/L (ref 0–37)
BILIRUBIN TOTAL: 1.3 mg/dL — AB (ref 0.2–1.2)
BUN: 13 mg/dL (ref 6–23)
CALCIUM: 9.5 mg/dL (ref 8.4–10.5)
CHLORIDE: 102 meq/L (ref 96–112)
CO2: 26 mEq/L (ref 19–32)
CREATININE: 0.99 mg/dL (ref 0.40–1.50)
GFR: 83.29 mL/min (ref >60.00)
Glucose, Bld: 122 mg/dL — ABNORMAL HIGH (ref 70–99)
Potassium: 3.8 mEq/L (ref 3.5–5.1)
SODIUM: 136 meq/L (ref 135–145)
TOTAL PROTEIN: 6.8 g/dL (ref 6.0–8.3)

## 2016-12-12 LAB — LIPID PANEL
CHOL/HDL RATIO: 5
CHOLESTEROL: 200 mg/dL (ref 0–200)
HDL: 39.5 mg/dL (ref >39.00)
NonHDL: 160.31
TRIGLYCERIDES: 269 mg/dL — AB (ref 0.0–149.0)
VLDL: 53.8 mg/dL — ABNORMAL HIGH (ref 0.0–40.0)

## 2016-12-12 LAB — HEMOGLOBIN A1C: HEMOGLOBIN A1C: 9.8 % — AB (ref 4.6–6.5)

## 2016-12-12 LAB — CBC
HCT: 46.9 % (ref 39.0–52.0)
Hemoglobin: 16.3 g/dL (ref 13.0–17.0)
MCHC: 34.8 g/dL (ref 30.0–36.0)
MCV: 91 fl (ref 78.0–100.0)
Platelets: 250 10*3/uL (ref 150.0–400.0)
RBC: 5.16 Mil/uL (ref 4.22–5.81)
RDW: 13.1 % (ref 11.5–15.5)
WBC: 7.5 10*3/uL (ref 4.0–10.5)

## 2016-12-12 LAB — PSA: PSA: 0.58 ng/mL (ref 0.10–4.00)

## 2016-12-12 LAB — URINE CULTURE: Organism ID, Bacteria: NO GROWTH

## 2016-12-12 LAB — URINALYSIS
Bilirubin Urine: NEGATIVE
Hgb urine dipstick: NEGATIVE
KETONES UR: 15 — AB
Leukocytes, UA: NEGATIVE
Nitrite: NEGATIVE
PH: 6 (ref 5.0–8.0)
SPECIFIC GRAVITY, URINE: 1.02 (ref 1.000–1.030)
TOTAL PROTEIN, URINE-UPE24: NEGATIVE
UROBILINOGEN UA: 0.2 (ref 0.0–1.0)

## 2016-12-12 LAB — LDL CHOLESTEROL, DIRECT: LDL DIRECT: 128 mg/dL

## 2016-12-12 LAB — TSH: TSH: 1.44 u[IU]/mL (ref 0.35–4.50)

## 2016-12-19 ENCOUNTER — Other Ambulatory Visit: Payer: Medicare HMO

## 2016-12-31 ENCOUNTER — Encounter: Payer: Self-pay | Admitting: Family Medicine

## 2017-01-02 ENCOUNTER — Encounter: Payer: Self-pay | Admitting: Family Medicine

## 2017-01-02 MED ORDER — OXYCODONE HCL 5 MG PO TABS: 5.0000 mg | ORAL_TABLET | Freq: Every day | ORAL | 0 refills | Status: DC

## 2017-01-04 MED FILL — oxyCODONE HCL 5 MG TABS: 5 | 30 days supply | Qty: 30 | Fill #0

## 2017-01-29 ENCOUNTER — Ambulatory Visit (INDEPENDENT_AMBULATORY_CARE_PROVIDER_SITE_OTHER): Payer: Medicare HMO | Admitting: Family Medicine

## 2017-01-29 ENCOUNTER — Encounter: Payer: Self-pay | Admitting: Family Medicine

## 2017-01-29 VITALS — BP 150/90 | HR 83 | Temp 98.1°F | Resp 18 | Wt 225.8 lb

## 2017-01-29 DIAGNOSIS — E119 Type 2 diabetes mellitus without complications: Secondary | ICD-10-CM | POA: Diagnosis not present

## 2017-01-29 DIAGNOSIS — E669 Obesity, unspecified: Secondary | ICD-10-CM

## 2017-01-29 DIAGNOSIS — I10 Essential (primary) hypertension: Secondary | ICD-10-CM

## 2017-01-29 DIAGNOSIS — E118 Type 2 diabetes mellitus with unspecified complications: Secondary | ICD-10-CM | POA: Diagnosis not present

## 2017-01-29 DIAGNOSIS — Z794 Long term (current) use of insulin: Secondary | ICD-10-CM

## 2017-01-29 DIAGNOSIS — IMO0001 Reserved for inherently not codable concepts without codable children: Secondary | ICD-10-CM

## 2017-01-29 MED ORDER — INSULIN DETEMIR 100 UNIT/ML ~~LOC~~ SOLN: 22.0000 [IU] | Freq: Every day | SUBCUTANEOUS | 3 refills | Status: DC

## 2017-01-29 NOTE — Progress Notes (Signed)
Subjective:  I acted as a Neurosurgeon for Dr. Abner Greenspan. [place], [place]  Patient ID: Sean Horn, male    DOB: 1961/07/05, 56 y.o.   MRN: [id number]  No chief complaint on file.   HPI  Patient is in today for a follow up visit. Patient is following on his DM. Patient states his blood sugars at home have not improved. No recent febrile illness or acute hospitalizations. Denies CP/palp/SOB/HA/congestion/fevers/GI or GU c/o. Taking meds as prescribed. He reports a high blood sugar over 500 and one episode of knowing his blood sugar was low but he was not near his glucometer. She was sweaty, anxious shakey then after eating he was better. Denies there was anything unusual about that day otherwise. Continues to struggle with ongoing knee pain but it is manageable most days and he is trying to be more active.  Patient Care Team: Bradd Canary, MD as PCP - General (Family Medicine)   Past Medical History:  Diagnosis Date  . Anxiety   . GERD (gastroesophageal reflux disease)    occasional - OTC as needed  . History of chicken pox   . History of kidney stones   . History of shingles   . Hypertension    under control with med., had been on med. since age 14  . Insomnia 09/13/2016  . Insulin dependent diabetes mellitus (HCC)    Follows with endocrinology, Dr Chestine Spore reports his last hgba1c was 7.3   . Non-insulin dependent type 2 diabetes mellitus (HCC)   . Osteoarthritis    right knee  . PONV (postoperative nausea and vomiting)   . PONV (postoperative nausea and vomiting)   . Preventative health care 08/14/2013  . Tachycardia   . Urinary frequency 12/11/2016    Past Surgical History:  Procedure Laterality Date  . CHOLECYSTECTOMY  1980's  . CYSTOSCOPY/RETROGRADE/URETEROSCOPY/STONE EXTRACTION WITH BASKET Right 08/27/2003   and double J stent placement  . INGUINAL HERNIA REPAIR Left 1990's  . KNEE ARTHROSCOPY Left 1980's  . KNEE ARTHROSCOPY Right 2013  . KNEE ARTHROSCOPY Right 06/30/2013   Procedure: RIGHT KNEE ARTHROSCOPY AND SYNOVECTOMY;  Surgeon: Velna Ochs, MD;  Location: [facility];  Service: Orthopedics;  Laterality: Right;  . LITHOTRIPSY     multiple, b/l  . SHOULDER ADHESION RELEASE Left ` 1998  . SHOULDER ARTHROSCOPY W/ ROTATOR CUFF REPAIR Left 10/20/2004   x 4, had to have part of clavile removed. torn rotator cuff multiple times, had to place screws   . SHOULDER ARTHROSCOPY W/ SUPERIOR LABRAL ANTERIOR POSTERIOR LESION REPAIR Left 12/22/2004  . TOTAL KNEE ARTHROPLASTY Right 12/23/2012   x3, 2 arthroscopies and a TKR,  TOTAL KNEE ARTHROPLASTY;  Surgeon: Velna Ochs, MD;  Location: [facility];  Service: Orthopedics;  Laterality: Right;  DEPUY, RNFA  . WRIST SURGERY Right    x 3, torn ligaments, pins placed then infected, then fused with plate    Family History  Problem Relation Age of Onset  . COPD Mother        smoker  . Hypertension Mother   . Heart disease Mother        CHF  . Diabetes Father   . Heart disease Father        quintuple bypass  . Stroke Father   . Hypertension Father   . Peripheral vascular disease Father   . Hypertension Son        tachycardia  . Heart disease Maternal Grandfather   . Arthritis Paternal  Grandmother   . Colon cancer Neg Hx   . Rectal cancer Neg Hx   . Stomach cancer Neg Hx     Social History   Social History  . Marital status: Single    Spouse name: N/A  . Number of children: N/A  . Years of education: N/A   Occupational History  . Not on file.   Social History Main Topics  . Smoking status: Never Smoker  . Smokeless tobacco: Current User    Types: Snuff  . Alcohol use Yes     Comment: occasionally  . Drug use: No  . Sexual activity: Yes     Comment: currently applying for disability due to knee, lives with girfriend, no dietary restrictions, previously Games developer   Other Topics Concern  . Not on file   Social History Narrative  . No narrative on file    Outpatient  Medications Prior to Visit  Medication Sig Dispense Refill  . carvedilol (COREG) 12.5 MG tablet Take 1 tablet (12.5 mg total) by mouth 2 (two) times daily with a meal. 60 tablet 6  . DULoxetine (CYMBALTA) 60 MG capsule Take 1 capsule (60 mg total) by mouth daily. 30 capsule 5  . escitalopram (LEXAPRO) 20 MG tablet TAKE ONE TABLET BY MOUTH ONCE DAILY 30 tablet 2  . insulin detemir (LEVEMIR) 100 UNIT/ML injection Inject 0.2 mLs (20 Units total) into the skin at bedtime. 10 mL 3  . insulin lispro (HUMALOG) 100 UNIT/ML injection Inject 0.1 mLs (10 Units total) into the skin 3 (three) times daily before meals. 120 mL 11  . lansoprazole (PREVACID) 30 MG capsule Take 30 mg by mouth daily at 12 noon.    . metFORMIN (GLUCOPHAGE) 1000 MG tablet TAKE ONE TABLET BY MOUTH TWICE DAILY WITH MEALS 60 tablet 3  . oxyCODONE (ROXICODONE) 5 MG immediate release tablet Take 1 tablet (5 mg total) by mouth daily. 30 tablet 0  . RA KRILL OIL 500 MG CAPS Take 1 capsule by mouth daily.    . rosuvastatin (CRESTOR) 10 MG tablet Take 1 tablet (10 mg total) by mouth daily. 90 tablet 0  . traZODone (DESYREL) 100 MG tablet Take 1 tablet (100 mg total) by mouth at bedtime. 30 tablet 3  . lisinopril-hydrochlorothiazide (PRINZIDE,ZESTORETIC) 10-12.5 MG per tablet Take 1 tablet by mouth daily. 30 tablet 5   No facility-administered medications prior to visit.     Allergies  Allergen Reactions  . Toradol [Ketorolac Tromethamine] Itching    Review of Systems  Constitutional: Positive for malaise/fatigue. Negative for fever.  HENT: Negative for congestion.   Eyes: Negative for blurred vision.  Respiratory: Negative for cough and shortness of breath.   Cardiovascular: Negative for chest pain, palpitations and leg swelling.  Gastrointestinal: Negative for vomiting.  Musculoskeletal: Positive for joint pain. Negative for back pain.  Skin: Negative for rash.  Neurological: Negative for loss of consciousness and headaches.        Objective:    Physical Exam  Constitutional: He is oriented to person, place, and time. He appears well-developed and well-nourished. No distress.  HENT:  Head: Normocephalic and atraumatic.  Eyes: Conjunctivae are normal.  Neck: Normal range of motion. No thyromegaly present.  Cardiovascular: Normal rate and regular rhythm.   Pulmonary/Chest: Effort normal and breath sounds normal. He has no wheezes.  Abdominal: Soft. Bowel sounds are normal. There is no tenderness.  Musculoskeletal: Normal range of motion. He exhibits no edema or deformity.  Neurological: He is alert and  oriented to person, place, and time.  Skin: Skin is warm and dry. He is not diaphoretic.  Psychiatric: He has a normal mood and affect.    BP (!) 150/90 (BP Location: Left Arm, Patient Position: Sitting, Cuff Size: Normal)   Pulse 83   Temp 98.1 F (36.7 C) (Oral)   Resp 18   Wt 225 lb 12.8 oz (102.4 kg)   SpO2 98%   BMI 30.62 kg/m  Wt Readings from Last 3 Encounters:  01/29/17 225 lb 12.8 oz (102.4 kg)  12/11/16 218 lb 9.6 oz (99.2 kg)  09/13/16 223 lb 9.6 oz (101.4 kg)   BP Readings from Last 3 Encounters:  01/29/17 (!) 150/90  12/11/16 131/87  09/13/16 138/88      There is no immunization history on file for this patient.  Health Maintenance  Topic Date Due  . Hepatitis C Screening  04-Jan-1961  . FOOT EXAM  07/10/1971  . OPHTHALMOLOGY EXAM  07/10/1971  . HIV Screening  07/09/1976  . COLONOSCOPY  07/10/2011  . PNEUMOCOCCAL POLYSACCHARIDE VACCINE (1) 03/13/2017 (Originally 07/10/1963)  . TETANUS/TDAP  03/13/2017 (Originally 07/09/1980)  . INFLUENZA VACCINE  03/06/2017  . HEMOGLOBIN A1C  06/13/2017    Lab Results  Component Value Date   WBC 7.5 12/11/2016   HGB 16.3 12/11/2016   HCT 46.9 12/11/2016   PLT 250.0 12/11/2016   GLUCOSE 122 (H) 12/11/2016   CHOL 200 12/11/2016   TRIG 269.0 (H) 12/11/2016   HDL 39.50 12/11/2016   LDLDIRECT 128.0 12/11/2016   ALT 22 12/11/2016   AST  19 12/11/2016   NA 136 12/11/2016   K 3.8 12/11/2016   CL 102 12/11/2016   CREATININE 0.99 12/11/2016   BUN 13 12/11/2016   CO2 26 12/11/2016   TSH 1.44 12/11/2016   PSA 0.58 12/11/2016   INR 1.03 12/16/2012   HGBA1C 9.8 (H) 12/11/2016    Lab Results  Component Value Date   TSH 1.44 12/11/2016   Lab Results  Component Value Date   WBC 7.5 12/11/2016   HGB 16.3 12/11/2016   HCT 46.9 12/11/2016   MCV 91.0 12/11/2016   PLT 250.0 12/11/2016   Lab Results  Component Value Date   NA 136 12/11/2016   K 3.8 12/11/2016   CO2 26 12/11/2016   GLUCOSE 122 (H) 12/11/2016   BUN 13 12/11/2016   CREATININE 0.99 12/11/2016   BILITOT 1.3 (H) 12/11/2016   ALKPHOS 59 12/11/2016   AST 19 12/11/2016   ALT 22 12/11/2016   PROT 6.8 12/11/2016   ALBUMIN 4.4 12/11/2016   CALCIUM 9.5 12/11/2016   GFR 83.29 12/11/2016   Lab Results  Component Value Date   CHOL 200 12/11/2016   Lab Results  Component Value Date   HDL 39.50 12/11/2016   [facility] results found for: [facility] Results  Component Value Date   TRIG 269.0 (H) 12/11/2016   Lab Results  Component Value Date   CHOLHDL 5 12/11/2016   Lab Results  Component Value Date   HGBA1C 9.8 (H) 12/11/2016         Assessment & Plan:   Problem List Items Addressed This Visit    None      I have discontinued Mr. Ermalene SearingMartin's lisinopril-hydrochlorothiazide. I am also having him maintain his RA KRILL OIL, lansoprazole, DULoxetine, traZODone, insulin lispro, rosuvastatin, escitalopram, metFORMIN, carvedilol, insulin detemir, and oxyCODONE.  No orders of the defined types were placed in this encounter.   CMA served as Neurosurgeonscribe during this visit.  History, Physical and Plan performed by medical provider. Documentation and orders reviewed and attested to.  Crissie Sickles, [place]

## 2017-01-29 NOTE — Patient Instructions (Addendum)
Increase Levemir to 22 after 3 days if no blod sugars lower than 110 increase by 2 more units and can repeat until sugars controlled ranging between 100 and 200 is ideal. Long term will want fasting sugars at 70 to 110 and after eating 100 to 140 One carb per meal, eat healthy meal every 4-5 hours, exercise, always brown carbs and lean proteins  Tylenol/Acetamnophen ES 500 mg tabs, 2 tabs twice daily Advil/Motrin/Ibuprofen 200 mg tabs 2 tabs twice daily Carbohydrate Counting for Diabetes Mellitus, Adult Carbohydrate counting is a method for keeping track of how many carbohydrates you eat. Eating carbohydrates naturally increases the amount of sugar (glucose) in the blood. Counting how many carbohydrates you eat helps keep your blood glucose within normal limits, which helps you manage your diabetes (diabetes mellitus). It is important to know how many carbohydrates you can safely have in each meal. This is different for every person. A diet and nutrition specialist (registered dietitian) can help you make a meal plan and calculate how many carbohydrates you should have at each meal and snack. Carbohydrates are found in the following foods:  Grains, such as breads and cereals.  Dried beans and soy products.  Starchy vegetables, such as potatoes, peas, and corn.  Fruit and fruit juices.  Milk and yogurt.  Sweets and snack foods, such as cake, cookies, candy, chips, and soft drinks.  How do I count carbohydrates? There are two ways to count carbohydrates in food. You can use either of the methods or a combination of both. Reading "Nutrition Facts" on packaged food The "Nutrition Facts" list is included on the labels of almost all packaged foods and beverages in the [place] It includes:  The serving size.  Information about nutrients in each serving, including the grams (g) of carbohydrate per serving.  To use the "Nutrition Facts":  Decide how many servings you will have.  Multiply the  number of servings by the number of carbohydrates per serving.  The resulting number is the total amount of carbohydrates that you will be having.  Learning standard serving sizes of other foods When you eat foods containing carbohydrates that are not packaged or do not include "Nutrition Facts" on the label, you need to measure the servings in order to count the amount of carbohydrates:  Measure the foods that you will eat with a food scale or measuring cup, if needed.  Decide how many standard-size servings you will eat.  Multiply the number of servings by 15. Most carbohydrate-rich foods have about 15 g of carbohydrates per serving. ? For example, if you eat 8 oz (170 g) of strawberries, you will have eaten 2 servings and 30 g of carbohydrates (2 servings x 15 g = 30 g).  For foods that have more than one food mixed, such as soups and casseroles, you must count the carbohydrates in each food that is included.  The following list contains standard serving sizes of common carbohydrate-rich foods. Each of these servings has about 15 g of carbohydrates:   hamburger bun or  English muffin.   oz (15 mL) syrup.   oz (14 g) jelly.  1 slice of bread.  1 six-inch tortilla.  3 oz (85 g) cooked rice or pasta.  4 oz (113 g) cooked dried beans.  4 oz (113 g) starchy vegetable, such as peas, corn, or potatoes.  4 oz (113 g) hot cereal.  4 oz (113 g) mashed potatoes or  of a large baked potato.  4  oz (113 g) canned or frozen fruit.  4 oz (120 mL) fruit juice.  4-6 crackers.  6 chicken nuggets.  6 oz (170 g) unsweetened dry cereal.  6 oz (170 g) plain fat-free yogurt or yogurt sweetened with artificial sweeteners.  8 oz (240 mL) milk.  8 oz (170 g) fresh fruit or one small piece of fruit.  24 oz (680 g) popped popcorn.  Example of carbohydrate counting Sample meal  3 oz (85 g) chicken breast.  6 oz (170 g) brown rice.  4 oz (113 g) corn.  8 oz (240 mL)  milk.  8 oz (170 g) strawberries with sugar-free whipped topping. Carbohydrate calculation 1. Identify the foods that contain carbohydrates: ? Rice. ? Corn. ? Milk. ? Strawberries. 2. Calculate how many servings you have of each food: ? 2 servings rice. ? 1 serving corn. ? 1 serving milk. ? 1 serving strawberries. 3. Multiply each number of servings by 15 g: ? 2 servings rice x 15 g = 30 g. ? 1 serving corn x 15 g = 15 g. ? 1 serving milk x 15 g = 15 g. ? 1 serving strawberries x 15 g = 15 g. 4. Add together all of the amounts to find the total grams of carbohydrates eaten: ? 30 g + 15 g + 15 g + 15 g = 75 g of carbohydrates total. This information is not intended to replace advice given to you by your health care provider. Make sure you discuss any questions you have with your health care provider. Document Released: 07/23/2005 Document Revised: 02/10/2016 Document Reviewed: 01/04/2016 Elsevier Interactive Patient Education  [employer].

## 2017-01-30 ENCOUNTER — Encounter: Payer: Self-pay | Admitting: Family Medicine

## 2017-01-30 DIAGNOSIS — E669 Obesity, unspecified: Secondary | ICD-10-CM | POA: Insufficient documentation

## 2017-01-30 HISTORY — DX: Obesity, unspecified: E66.9

## 2017-01-30 NOTE — Assessment & Plan Note (Signed)
Mild elevation today. Encouraged heart healthy diet such as the DASH diet and exercise as tolerated. Monitor and let us know if concerning symptoms or numbers develop

## 2017-01-30 NOTE — Assessment & Plan Note (Signed)
Sugars remain elevated. He is referred to endocrinology for further management and his Levemir is increased to 22 units and he is allowed to increase by 2 units every 3 days if no blood sugars lower than 110 increase by 2 more units and can repeat until sugars controlled ranging between 100 and 200 is ideal. Long term will want fasting sugars at 70 to 110 and after eating 100 to 140

## 2017-01-30 NOTE — Assessment & Plan Note (Signed)
Encouraged DASH diet, decrease po intake and increase exercise as tolerated. Needs 7-8 hours of sleep nightly. Avoid trans fats, eat small, frequent meals every 4-5 hours with lean proteins, complex carbs and healthy fats. Minimize simple carbs, bariatric referral considered 

## 2017-02-05 ENCOUNTER — Other Ambulatory Visit: Payer: Self-pay | Admitting: *Deleted

## 2017-02-05 MED ORDER — DULOXETINE HCL 60 MG PO CPEP: 60.0000 mg | ORAL_CAPSULE | Freq: Every day | ORAL | 1 refills | Status: DC

## 2017-03-12 ENCOUNTER — Encounter: Payer: Medicare HMO | Admitting: Family Medicine

## 2017-03-12 DIAGNOSIS — Z0289 Encounter for other administrative examinations: Secondary | ICD-10-CM

## 2017-03-20 ENCOUNTER — Other Ambulatory Visit: Payer: Self-pay | Admitting: Family Medicine

## 2017-04-02 ENCOUNTER — Encounter: Payer: Self-pay | Admitting: Family Medicine

## 2017-04-03 DIAGNOSIS — M25532 Pain in left wrist: Secondary | ICD-10-CM | POA: Diagnosis not present

## 2017-04-03 DIAGNOSIS — M654 Radial styloid tenosynovitis [de Quervain]: Secondary | ICD-10-CM | POA: Diagnosis not present

## 2017-04-19 ENCOUNTER — Ambulatory Visit: Payer: Medicare HMO | Admitting: Internal Medicine

## 2017-05-27 ENCOUNTER — Ambulatory Visit (INDEPENDENT_AMBULATORY_CARE_PROVIDER_SITE_OTHER): Payer: Medicare HMO | Admitting: Internal Medicine

## 2017-05-27 ENCOUNTER — Encounter: Payer: Self-pay | Admitting: Internal Medicine

## 2017-05-27 VITALS — BP 138/84 | HR 89 | Wt 217.0 lb

## 2017-05-27 DIAGNOSIS — Z794 Long term (current) use of insulin: Secondary | ICD-10-CM

## 2017-05-27 DIAGNOSIS — E119 Type 2 diabetes mellitus without complications: Secondary | ICD-10-CM

## 2017-05-27 DIAGNOSIS — IMO0001 Reserved for inherently not codable concepts without codable children: Secondary | ICD-10-CM

## 2017-05-27 LAB — POCT GLYCOSYLATED HEMOGLOBIN (HGB A1C): Hemoglobin A1C: 13.7

## 2017-05-27 NOTE — Progress Notes (Signed)
Patient ID: LANDO ALCALDE, male   DOB: 1961-07-10, 56 y.o.   MRN: [id number]   HPI: DAYTONA RETANA is a 56 y.o.-year-old male, referred by his PCP, Dr. Abner Greenspan for management of DM2, dx in 2013, insulin-dependent since 2017, uncontrolled, without long-term complications. He was previously seen by Dr. Chestine Spore.  Last hemoglobin A1c was: Lab Results  Component Value Date   HGBA1C 9.8 (H) 12/11/2016   HGBA1C 9.7 (H) 09/13/2016   HGBA1C 7.3 (H) 05/26/2015   Pt is on a regimen of: - Metformin 1000 mg 2x a day, with meals - Levemir 22 units at bedtime - Humalog 10 units 3x a day, before meals - forgets lunchtime during the week He tried Invokana >> worked but cannot remember why he stopped (believes 2/2 $$$)  Pt checks his sugars 1-2x a day and they are: - am: 160-300s, 400 - 2h after b'fast: n/c - before lunch: 80 x1 (working outside) - 2h after lunch: n/c - before dinner: n/c - 2h after dinner: n/c - bedtime: 200-300s - nighttime: n/c Lowest sugar was 80; he has hypoglycemia awareness at 80.  Highest sugar was 400s.  Glucometer: One Touch Ultra  Pt's meals are: - Breakfast: cereal: cheerios + 2% milk - Lunch: sandwich  - Dinner: meat + veggies and less starches - Snacks: after dinner - nuts; granola; sugar-free cookies Midnight snack: e.g. banana   - no CKD, last BUN/creatinine:  Lab Results  Component Value Date   BUN 13 12/11/2016   BUN 11 09/13/2016   CREATININE 0.99 12/11/2016   CREATININE 0.90 09/13/2016  On lisinopril 10. - last set of lipids: Lab Results  Component Value Date   CHOL 200 12/11/2016   HDL 39.50 12/11/2016   LDLDIRECT 128.0 12/11/2016   TRIG 269.0 (H) 12/11/2016   CHOLHDL 5 12/11/2016   On Crestor 10 - started 12/2016 - last eye exam was in 10/2016. No DR.  - No numbness and tingling in his feet.  Pt has FH of DM in Father.  ROS: Constitutional: no weight gain/loss, no fatigue, no subjective hyperthermia/hypothermia, + increased urination  and nocturia, + poor sleep Eyes: no blurry vision, no xerophthalmia ENT: no sore throat, no nodules palpated in throat, no dysphagia/odynophagia, no hoarseness Cardiovascular: no CP/SOB/palpitations/leg swelling Respiratory: no cough/SOB Gastrointestinal: no N/V/D/C/+ acid reflux Musculoskeletal: + both:  muscle/joint aches Skin: no rashes Neurological: no tremors/numbness/tingling/dizziness Psychiatric: no depression/+ anxiety + low libido, + pbs with erections  Past Medical History:  Diagnosis Date  . Anxiety   . GERD (gastroesophageal reflux disease)    occasional - OTC as needed  . History of chicken pox   . History of kidney stones   . History of shingles   . Hypertension    under control with med., had been on med. since age 56  . Insomnia 09/13/2016  . Insulin dependent diabetes mellitus (HCC)    Follows with endocrinology, Dr Chestine Spore reports his last hgba1c was 7.3   . Non-insulin dependent type 2 diabetes mellitus (HCC)   . Obesity 01/30/2017  . Osteoarthritis    right knee  . PONV (postoperative nausea and vomiting)   . PONV (postoperative nausea and vomiting)   . Preventative health care 08/14/2013  . Tachycardia   . Urinary frequency 12/11/2016   Past Surgical History:  Procedure Laterality Date  . CHOLECYSTECTOMY  1980's  . CYSTOSCOPY/RETROGRADE/URETEROSCOPY/STONE EXTRACTION WITH BASKET Right 08/27/2003   and double J stent placement  . INGUINAL HERNIA REPAIR Left 1990's  .  KNEE ARTHROSCOPY Left 1980's  . KNEE ARTHROSCOPY Right 2013  . KNEE ARTHROSCOPY Right 06/30/2013   Procedure: RIGHT KNEE ARTHROSCOPY AND SYNOVECTOMY;  Surgeon: Velna Ochs, MD;  Location: [facility];  Service: Orthopedics;  Laterality: Right;  . LITHOTRIPSY     multiple, b/l  . SHOULDER ADHESION RELEASE Left ` 1998  . SHOULDER ARTHROSCOPY W/ ROTATOR CUFF REPAIR Left 10/20/2004   x 4, had to have part of clavile removed. torn rotator cuff multiple times, had to place screws    . SHOULDER ARTHROSCOPY W/ SUPERIOR LABRAL ANTERIOR POSTERIOR LESION REPAIR Left 12/22/2004  . TOTAL KNEE ARTHROPLASTY Right 12/23/2012   x3, 2 arthroscopies and a TKR,  TOTAL KNEE ARTHROPLASTY;  Surgeon: Velna Ochs, MD;  Location: [facility];  Service: Orthopedics;  Laterality: Right;  DEPUY, RNFA  . WRIST SURGERY Right    x 3, torn ligaments, pins placed then infected, then fused with plate   Social History   Social History  . Marital status: Single    Spouse name: N/A  . Number of children: 2   Occupational History  . Retired, disability after his R knee sx.   Social History Main Topics  . Smoking status: Never Smoker  . Smokeless tobacco: Current User    Types: Snuff  . Alcohol use Yes     Comment: occasionally  . Drug use: No  . Sexual activity: Yes     Comment: currently applying for disability due to knee, lives with girfriend, no dietary restrictions, previously Games developer   Current Outpatient Prescriptions on File Prior to Visit  Medication Sig Dispense Refill  . carvedilol (COREG) 12.5 MG tablet Take 1 tablet (12.5 mg total) by mouth 2 (two) times daily with a meal. 60 tablet 6  . escitalopram (LEXAPRO) 20 MG tablet TAKE 1 TABLET BY MOUTH ONCE DAILY 30 tablet 2  . insulin detemir (LEVEMIR) 100 UNIT/ML injection Inject 0.22 mLs (22 Units total) into the skin at bedtime. 10 mL 3  . insulin lispro (HUMALOG) 100 UNIT/ML injection Inject 0.1 mLs (10 Units total) into the skin 3 (three) times daily before meals. 120 mL 11  . lansoprazole (PREVACID) 30 MG capsule Take 30 mg by mouth daily at 12 noon.    . metFORMIN (GLUCOPHAGE) 1000 MG tablet TAKE ONE TABLET BY MOUTH TWICE DAILY WITH MEALS 60 tablet 3  . traZODone (DESYREL) 100 MG tablet Take 1 tablet (100 mg total) by mouth at bedtime. 30 tablet 3  . DULoxetine (CYMBALTA) 60 MG capsule Take 1 capsule (60 mg total) by mouth daily. (Patient not taking: Reported on 05/27/2017) 90 capsule 1  . oxyCODONE (ROXICODONE) 5 MG  immediate release tablet Take 1 tablet (5 mg total) by mouth daily. (Patient not taking: Reported on 05/27/2017) 30 tablet 0  . RA KRILL OIL 500 MG CAPS Take 1 capsule by mouth daily.    . rosuvastatin (CRESTOR) 10 MG tablet Take 1 tablet (10 mg total) by mouth daily. (Patient not taking: Reported on 05/27/2017) 90 tablet 0   No current facility-administered medications on file prior to visit.    Allergies  Allergen Reactions  . Toradol [Ketorolac Tromethamine] Itching   Family History  Problem Relation Age of Onset  . COPD Mother        smoker  . Hypertension Mother   . Heart disease Mother        CHF  . Diabetes Father   . Heart disease Father  quintuple bypass  . Stroke Father   . Hypertension Father   . Peripheral vascular disease Father   . Hypertension Son        tachycardia  . Heart disease Maternal Grandfather   . Arthritis Paternal Grandmother   . Colon cancer Neg Hx   . Rectal cancer Neg Hx   . Stomach cancer Neg Hx    PE: BP 138/84 (BP Location: Left Arm, Patient Position: Sitting)   Pulse 89   Wt 217 lb (98.4 kg)   SpO2 97%   BMI 29.43 kg/m  Wt Readings from Last 3 Encounters:  05/27/17 217 lb (98.4 kg)  01/29/17 225 lb 12.8 oz (102.4 kg)  12/11/16 218 lb 9.6 oz (99.2 kg)   Constitutional: overweight, in NAD Eyes: PERRLA, EOMI, no exophthalmos ENT: moist mucous membranes, no thyromegaly, no cervical lymphadenopathy Cardiovascular: RRR, No MRG Respiratory: CTA B Gastrointestinal: abdomen soft, NT, ND, BS+ Musculoskeletal: no deformities, strength intact in all 4 Skin: moist, warm, no rashes Neurological: no tremor with outstretched hands, DTR normal in all 4  ASSESSMENT: 1. DM2, non-insulin-dependent, uncontrolled, without long term complications, but with significant hyperglycemia  PLAN:  1. Patient with long-standing, uncontrolled diabetes, on oral antidiabetic regimen, which became insufficient. HbA1c today is very high, 13.7%. - he is on  Metformin + basal-bolus insulin, but the doses appear too low for him. Sugars 200-400. However, if he exerts himself, may decrease to 80. Due to the variability and the very high HbA1c on a comprehensive regimen >> I suspect he may have insulin deficiency >> will need to check his insulin production. We could not do this today as CBG in the office was >200. - we discussed about continuing the Metformin and increasing his insulin doses. SGLT2 inh would not be a good idea as he has frequent urination throughout the day and night. GLP1 R agonists are tier 3 for him >> we can try to use them if his insulin production is not low in the near future. For now, we discussed at length about changing his diet >> given ideas for healthier b'fasts, healthy snacks, advised not to eat at night, etc. OTW, will increase his insulin doses and give him a more flexible rapid acting insulin regimen. Discussed to get in touch with me in 2 weeks to see if any adjustments will be needed then. - I suggested to:  Patient Instructions  Please continue: - Metformin 1000 mg 2x a day with meals  Please increase: - Levemir 30 units at bedtime - Humalog  10 units for a smaller meal 15 units for a regular meal 20 units for a larger meal  Please return in 1.5 months with your sugar log.   - Strongly advised him to start checking sugars at different times of the day - check 3 times a day, rotating checks - given sugar log and advised how to fill it and to bring it at next appt  - given foot care handout and explained the principles  - given instructions for hypoglycemia management "15-15 rule"  - advised for yearly eye exams  - refuses flu shot  - Return to clinic in 1.5 mo with sugar log   Carlus Pavlovristina Milad Bublitz, MD PhD [facility]

## 2017-05-27 NOTE — Patient Instructions (Signed)
Please continue: - Metformin 1000 mg 2x a day with meals  Please increase: - Levemir 30 units at bedtime - Humalog  10 units for a smaller meal 15 units for a regular meal 20 units for a larger meal  Please return in 1.5 months with your sugar log.   PATIENT INSTRUCTIONS FOR TYPE 2 DIABETES:  DIET AND EXERCISE Diet and exercise is an important part of diabetic treatment.  We recommended aerobic exercise in the form of brisk walking (working between 40-60% of maximal aerobic capacity, similar to brisk walking) for 150 minutes per week (such as 30 minutes five days per week) along with 3 times per week performing 'resistance' training (using various gauge rubber tubes with handles) 5-10 exercises involving the major muscle groups (upper body, lower body and core) performing 10-15 repetitions (or near fatigue) each exercise. Start at half the above goal but build slowly to reach the above goals. If limited by weight, joint pain, or disability, we recommend daily walking in a swimming pool with water up to waist to reduce pressure from joints while allow for adequate exercise.    BLOOD GLUCOSES Monitoring your blood glucoses is important for continued management of your diabetes. Please check your blood glucoses 2-4 times a day: fasting, before meals and at bedtime (you can rotate these measurements - e.g. one day check before the 3 meals, the next day check before 2 of the meals and before bedtime, etc.).   HYPOGLYCEMIA (low blood sugar) Hypoglycemia is usually a reaction to not eating, exercising, or taking too much insulin/ other diabetes drugs.  Symptoms include tremors, sweating, hunger, confusion, headache, etc. Treat IMMEDIATELY with 15 grams of Carbs: . 4 glucose tablets .  cup regular juice/soda . 2 tablespoons raisins . 4 teaspoons sugar . 1 tablespoon honey Recheck blood glucose in 15 mins and repeat above if still symptomatic/blood glucose <100.  RECOMMENDATIONS TO REDUCE  YOUR RISK OF DIABETIC COMPLICATIONS: * Take your prescribed MEDICATION(S) * Follow a DIABETIC diet: Complex carbs, fiber rich foods, (monounsaturated and polyunsaturated) fats * AVOID saturated/trans fats, high fat foods, >2,300 mg salt per day. * EXERCISE at least 5 times a week for 30 minutes or preferably daily.  * DO NOT SMOKE OR DRINK more than 1 drink a day. * Check your FEET every day. Do not wear tightfitting shoes. Contact us if you develop an ulcer * See your EYE doctor once a year or more if needed * Get a FLU shot once a year * Get a PNEUMONIA vaccine once before and once after age 35 years  GOALS:  * Your Hemoglobin A1c of <7%  * fasting sugars need to be <130 * after meals sugars need to be <180 (2h after you start eating) * Your Systolic BP should be 140 or lower  * Your Diastolic BP should be 80 or lower  * Your HDL (Good Cholesterol) should be 40 or higher  * Your LDL (Bad Cholesterol) should be 100 or lower. * Your Triglycerides should be 150 or lower  * Your Urine microalbumin (kidney function) should be <30 * Your Body Mass Index should be 25 or lower    Please consider the following ways to cut down carbs and fat and increase fiber and micronutrients in your diet: - substitute whole grain for white bread or pasta - substitute brown rice for white rice - substitute 90-calorie flat bread pieces for slices of bread when possible - substitute sweet potatoes or yams for white potatoes -  substitute humus for margarine - substitute tofu for cheese when possible - substitute almond or rice milk for regular milk (would not drink soy milk daily due to concern for soy estrogen influence on breast cancer risk) - substitute dark chocolate for other sweets when possible - substitute water - can add lemon or orange slices for taste - for diet sodas (artificial sweeteners will trick your body that you can eat sweets without getting calories and will lead you to overeating and  weight gain in the long run) - do not skip breakfast or other meals (this will slow down the metabolism and will result in more weight gain over time)  - can try smoothies made from fruit and almond/rice milk in am instead of regular breakfast - can also try old-fashioned (not instant) oatmeal made with almond/rice milk in am - order the dressing on the side when eating salad at a restaurant (pour less than half of the dressing on the salad) - eat as little meat as possible - can try juicing, but should not forget that juicing will get rid of the fiber, so would alternate with eating raw veg./fruits or drinking smoothies - use as little oil as possible, even when using olive oil - can dress a salad with a mix of balsamic vinegar and lemon juice, for e.g. - use agave nectar, stevia sugar, or regular sugar rather than artificial sweateners - steam or broil/roast veggies  - snack on veggies/fruit/nuts (unsalted, preferably) when possible, rather than processed foods - reduce or eliminate aspartame in diet (it is in diet sodas, chewing gum, etc) Read the labels!  Try to read Dr. Katherina RightNeal Barnard's book: "Program for Reversing Diabetes" for other ideas for healthy eating.

## 2017-05-27 NOTE — Addendum Note (Signed)
Addended by: Darene LamerHOMPSON, Demi Trieu T on: 05/27/2017 01:01 PM   Modules accepted: Orders

## 2017-05-30 ENCOUNTER — Telehealth: Payer: Self-pay | Admitting: *Deleted

## 2017-05-30 NOTE — Telephone Encounter (Signed)
Caller name: Ariane Relationship to pt; [employer]  Contact Number : [phone]  Reason for call : medication Review

## 2017-06-03 NOTE — Telephone Encounter (Signed)
Called number below left message for them to call me back

## 2017-07-12 ENCOUNTER — Telehealth: Payer: Self-pay | Admitting: Family Medicine

## 2017-07-12 NOTE — Telephone Encounter (Signed)
Pt did not pick up their Rx for ROXICODONE dated 10/31/2016. Rx was shredded

## 2017-07-18 ENCOUNTER — Ambulatory Visit: Payer: Medicare HMO | Admitting: Internal Medicine

## 2017-07-19 ENCOUNTER — Encounter: Payer: Self-pay | Admitting: Internal Medicine

## 2017-07-19 ENCOUNTER — Ambulatory Visit: Payer: Medicare HMO | Admitting: Internal Medicine

## 2017-07-19 VITALS — BP 170/100 | HR 92 | Ht 72.75 in | Wt 223.6 lb

## 2017-07-19 DIAGNOSIS — Z794 Long term (current) use of insulin: Secondary | ICD-10-CM | POA: Diagnosis not present

## 2017-07-19 DIAGNOSIS — E119 Type 2 diabetes mellitus without complications: Secondary | ICD-10-CM

## 2017-07-19 DIAGNOSIS — IMO0001 Reserved for inherently not codable concepts without codable children: Secondary | ICD-10-CM

## 2017-07-19 DIAGNOSIS — E785 Hyperlipidemia, unspecified: Secondary | ICD-10-CM | POA: Diagnosis not present

## 2017-07-19 MED ORDER — DULAGLUTIDE 0.75 MG/0.5ML ~~LOC~~ SOAJ: SUBCUTANEOUS | 1 refills | Status: DC

## 2017-07-19 MED ORDER — INSULIN DETEMIR 100 UNIT/ML ~~LOC~~ SOLN: 60.0000 [IU] | Freq: Every day | SUBCUTANEOUS | 3 refills | Status: DC

## 2017-07-19 MED ORDER — METFORMIN HCL 1000 MG PO TABS: 1000.0000 mg | ORAL_TABLET | Freq: Two times a day (BID) | ORAL | 3 refills | Status: DC

## 2017-07-19 MED ORDER — INSULIN LISPRO 100 UNIT/ML ~~LOC~~ SOLN: 20.0000 [IU] | Freq: Three times a day (TID) | SUBCUTANEOUS | 3 refills | Status: DC

## 2017-07-19 NOTE — Patient Instructions (Addendum)
Please continue: - Metformin 1000 mg 2x a day with meals - Levemir 60 units at bedtime  Please increase: - Humalog: 20 units for a smaller meal 25 units for a regular meal 30 units for a larger meal  Please start Trulicity 0.75 mg weekly. Let me know when you are close to running out to call in the higher dose to your pharmacy (1.5 mg).  Please return in 1.5 months with your sugar log.

## 2017-07-19 NOTE — Progress Notes (Signed)
Patient ID: Sean Horn, male   DOB: 1961/01/09, 56 y.o.   MRN: [id number]   HPI: Sean Horn is a 56 y.o.-year-old male, returning for follow-up for DM2, dx in 2013, insulin-dependent since 2017, uncontrolled, without long-term complications. He was previously seen by Dr. Chestine Spore.  Last visit 1.5 months ago.  Last hemoglobin A1c was: Lab Results  Component Value Date   HGBA1C 13.7 05/27/2017   HGBA1C 9.8 (H) 12/11/2016   HGBA1C 9.7 (H) 09/13/2016   Patient was previously on: - Metformin 1000 mg 2x a day, with meals - Levemir 22 units at bedtime - Humalog 10 units 3x a day, before meals - forgets lunchtime during the week He tried Invokana >> worked but cannot remember why he stopped (believes 2/2 $$$)  At last visit, we changed to: - Metformin 1000 mg 2x a day with meals - Levemir 30 >> 60 units at bedtime - Humalog: 10 units for a smaller meal 15 units for a regular meal 20 units for a larger meal  Pt checks his sugars 1-2 times a day -per review of his meter: - am: 160-300s, 400 >> 298-400 - 2h after b'fast: n/c >> 336 - before lunch: 80 x1 (working outside) >> >600 - 2h after lunch: n/c - before dinner: n/c - 2h after dinner: n/c - bedtime: 200-300s - nighttime: n/c Lowest sugar was 80 >> 200s; he has hypoglycemia awareness in the 80s. Highest sugar was 400s >> HI  Glucometer: One Touch Ultra  Pt's meals are: - Breakfast: cereal: cheerios + 2% milk - Lunch: sandwich  - Dinner: meat + veggies and less starches - Snacks: after dinner - nuts; granola; sugar-free cookies Midnight snack: e.g. banana   -No CKD, last BUN/creatinine:  Lab Results  Component Value Date   BUN 13 12/11/2016   BUN 11 09/13/2016   CREATININE 0.99 12/11/2016   CREATININE 0.90 09/13/2016  On lisinopril 10. -+ HL; last set of lipids: Lab Results  Component Value Date   CHOL 200 12/11/2016   HDL 39.50 12/11/2016   LDLDIRECT 128.0 12/11/2016   TRIG 269.0 (H) 12/11/2016   CHOLHDL  5 12/11/2016   On Crestor 10 mg daily- started 12/2016 - last eye exam was in 10/2016: No DR.  - No numbness and tingling in his feet.  He has family history of diabetes in father.  ROS: Constitutional: no weight gain/no weight loss, no fatigue, no subjective hyperthermia, no subjective hypothermia, + nocturia Eyes: no blurry vision, no xerophthalmia ENT: no sore throat, no nodules palpated in throat, no dysphagia, no odynophagia, no hoarseness Cardiovascular: no CP/no SOB/no palpitations/no leg swelling Respiratory: no cough/no SOB/no wheezing Gastrointestinal: no N/no V/no D/no C/no acid reflux Musculoskeletal: no muscle aches/no joint aches Skin: no rashes, no hair loss Neurological: no tremors/no numbness/no tingling/no dizziness  I reviewed pt's medications, allergies, PMH, social hx, family hx, and changes were documented in the history of present illness. Otherwise, unchanged from my initial visit note.  Past Medical History:  Diagnosis Date  . Anxiety   . GERD (gastroesophageal reflux disease)    occasional - OTC as needed  . History of chicken pox   . History of kidney stones   . History of shingles   . Hypertension    under control with med., had been on med. since age 56  . Insomnia 09/13/2016  . Insulin dependent diabetes mellitus (HCC)    Follows with endocrinology, Dr Chestine Spore reports his last hgba1c was 7.3   .  Non-insulin dependent type 2 diabetes mellitus (HCC)   . Obesity 01/30/2017  . Osteoarthritis    right knee  . PONV (postoperative nausea and vomiting)   . PONV (postoperative nausea and vomiting)   . Preventative health care 08/14/2013  . Tachycardia   . Urinary frequency 12/11/2016   Past Surgical History:  Procedure Laterality Date  . CHOLECYSTECTOMY  1980's  . CYSTOSCOPY/RETROGRADE/URETEROSCOPY/STONE EXTRACTION WITH BASKET Right 08/27/2003   and double J stent placement  . INGUINAL HERNIA REPAIR Left 1990's  . KNEE ARTHROSCOPY Left 1980's  . KNEE  ARTHROSCOPY Right 2013  . KNEE ARTHROSCOPY Right 06/30/2013   Procedure: RIGHT KNEE ARTHROSCOPY AND SYNOVECTOMY;  Surgeon: Velna OchsPeter G Dalldorf, MD;  Location: [facility];  Service: Orthopedics;  Laterality: Right;  . LITHOTRIPSY     multiple, b/l  . SHOULDER ADHESION RELEASE Left ` 1998  . SHOULDER ARTHROSCOPY W/ ROTATOR CUFF REPAIR Left 10/20/2004   x 4, had to have part of clavile removed. torn rotator cuff multiple times, had to place screws   . SHOULDER ARTHROSCOPY W/ SUPERIOR LABRAL ANTERIOR POSTERIOR LESION REPAIR Left 12/22/2004  . TOTAL KNEE ARTHROPLASTY Right 12/23/2012   x3, 2 arthroscopies and a TKR,  TOTAL KNEE ARTHROPLASTY;  Surgeon: Velna OchsPeter G Dalldorf, MD;  Location: [facility];  Service: Orthopedics;  Laterality: Right;  DEPUY, RNFA  . WRIST SURGERY Right    x 3, torn ligaments, pins placed then infected, then fused with plate   Social History   Social History  . Marital status: Single    Spouse name: N/A  . Number of children: 2   Occupational History  . Retired, disability after his R knee sx.   Social History Main Topics  . Smoking status: Never Smoker  . Smokeless tobacco: Current User    Types: Snuff  . Alcohol use Yes     Comment: occasionally  . Drug use: No  . Sexual activity: Yes     Comment: currently applying for disability due to knee, lives with girfriend, no dietary restrictions, previously Games developerdiesel mechanic   Current Outpatient Medications on File Prior to Visit  Medication Sig Dispense Refill  . carvedilol (COREG) 12.5 MG tablet Take 1 tablet (12.5 mg total) by mouth 2 (two) times daily with a meal. 60 tablet 6  . DULoxetine (CYMBALTA) 60 MG capsule Take 1 capsule (60 mg total) by mouth daily. (Patient not taking: Reported on 05/27/2017) 90 capsule 1  . escitalopram (LEXAPRO) 20 MG tablet TAKE 1 TABLET BY MOUTH ONCE DAILY 30 tablet 2  . insulin detemir (LEVEMIR) 100 UNIT/ML injection Inject 0.22 mLs (22 Units total) into the skin at bedtime. 10 mL  3  . insulin lispro (HUMALOG) 100 UNIT/ML injection Inject 0.1 mLs (10 Units total) into the skin 3 (three) times daily before meals. 120 mL 11  . lansoprazole (PREVACID) 30 MG capsule Take 30 mg by mouth daily at 12 noon.    . loratadine (CLARITIN) 10 MG tablet Take 10 mg by mouth daily.    . metFORMIN (GLUCOPHAGE) 1000 MG tablet TAKE ONE TABLET BY MOUTH TWICE DAILY WITH MEALS 60 tablet 3  . oxyCODONE (ROXICODONE) 5 MG immediate release tablet Take 1 tablet (5 mg total) by mouth daily. (Patient not taking: Reported on 05/27/2017) 30 tablet 0  . RA KRILL OIL 500 MG CAPS Take 1 capsule by mouth daily.    . rosuvastatin (CRESTOR) 10 MG tablet Take 1 tablet (10 mg total) by mouth daily. 90 tablet 0  .  traZODone (DESYREL) 100 MG tablet Take 1 tablet (100 mg total) by mouth at bedtime. 30 tablet 3   No current facility-administered medications on file prior to visit.    Allergies  Allergen Reactions  . Toradol [Ketorolac Tromethamine] Itching   Family History  Problem Relation Age of Onset  . COPD Mother        smoker  . Hypertension Mother   . Heart disease Mother        CHF  . Diabetes Father   . Heart disease Father        quintuple bypass  . Stroke Father   . Hypertension Father   . Peripheral vascular disease Father   . Hypertension Son        tachycardia  . Heart disease Maternal Grandfather   . Arthritis Paternal Grandmother   . Colon cancer Neg Hx   . Rectal cancer Neg Hx   . Stomach cancer Neg Hx    PE: BP (!) 170/100   Pulse 92   Ht 6' 0.75" (1.848 m)   Wt 223 lb 9.6 oz (101.4 kg)   SpO2 95%   BMI 29.70 kg/m  Wt Readings from Last 3 Encounters:  07/19/17 223 lb 9.6 oz (101.4 kg)  05/27/17 217 lb (98.4 kg)  01/29/17 225 lb 12.8 oz (102.4 kg)   Constitutional: overweight, in NAD Eyes: PERRLA, EOMI, no exophthalmos ENT: moist mucous membranes, no thyromegaly, no cervical lymphadenopathy Cardiovascular: Tachycardia, RR, No MRG Respiratory: CTA  B Gastrointestinal: abdomen soft, NT, ND, BS+ Musculoskeletal: no deformities, strength intact in all 4 Skin: moist, warm, no rashes Neurological: no tremor with outstretched hands, DTR normal in all 4  ASSESSMENT: 1. DM2, insulin-dependent, uncontrolled, without long term complications, but with significant hyperglycemia  2. HL  PLAN:  1. Patient with history of uncontrolled diabetes, currently on metformin and basal-bolus insulin regimen with sugars in the 200-400s at last visit, now improving after adjusting his insulin doses.  At last visit, his HbA1c was very high, 13.7%. - He also has significant variability in his blood sugars, so I do suspect the degree of pancreatic insufficiency.  We will need to check his insulin production when CBGs are lower than 200. - We are limited in what we can use for his diabetes control as he has frequent urination so SGLT2 inhibitors would not be ideal, and GLP-1 receptor agonist are tier 3 for him.  At last visit, we discussed at length about changing his diet and gave him ideas for healthier breakfast, snacks, and eating times. - At this visit, his sugars are actually worse and he had to increase his Levemir further to 60 units daily.  However, he did not increase the Humalog as advised and he still takes 10 units before meals.  We discussed that this is not nearly enough and we will increase t the doses today.  I also suggested that we added a GLP-1 receptor agonist and I am hoping that this would be covered by his insurance. - I suggested to:  Patient Instructions  Please continue: - Metformin 1000 mg 2x a day with meals - Levemir 60 units at bedtime  Please increase: - Humalog: 20 units for a smaller meal 25 units for a regular meal 30 units for a larger meal  Please start Trulicity 0.75 mg weekly. Let me know when you are close to running out to call in the higher dose to your pharmacy (1.5 mg).  Please return in 1.5 months with your  sugar  log.   - continue checking sugars at different times of the day - check 3x a day, rotating checks >>  I advised to start writing his sugars down as we could not download his meter - advised for yearly eye exams >> he is UTD - Return to clinic in 1.5 mo with sugar log   2. HL  - reviewed latest lipid panel from 12/2016: Both triglycerides and his LDL were high he was started on Crestor at that time.  Improving diabetes should also help. - no side effects from Crestor - He will need a new lipid panel >> he has appointment with PCP for this  Carlus Pavlov, MD PhD [facility]

## 2017-07-25 ENCOUNTER — Other Ambulatory Visit: Payer: Self-pay

## 2017-07-25 MED ORDER — INSULIN ASPART 100 UNIT/ML FLEXPEN: PEN_INJECTOR | SUBCUTANEOUS | 0 refills | Status: DC

## 2017-08-01 ENCOUNTER — Encounter: Payer: Self-pay | Admitting: Internal Medicine

## 2017-08-07 ENCOUNTER — Other Ambulatory Visit: Payer: Self-pay | Admitting: Family Medicine

## 2017-08-08 MED ORDER — DULAGLUTIDE 1.5 MG/0.5ML ~~LOC~~ SOAJ: SUBCUTANEOUS | 1 refills | Status: DC

## 2017-08-27 NOTE — Progress Notes (Addendum)
[facility] [address] [place], [place] [address] [phone]  Date:  08/28/2017   Name:  Sean Horn   DOB:  [birth year]   MRN:  [id number]  PCP:  Bradd Canary, MD    Chief Complaint: Headache (c/o of headache that has worsened over the past 3 weeks. )   History of Present Illness:  Sean Horn is a 57 y.o. very pleasant male patient who presents with the following:  Pt of Dr. Abner Greenspan with history of DM, hyperlipidemia, HTN.  He was recently sent to see Dr. Elvera Lennox to manage his DM as it has been difficult to control. Per her note from mid December:  Patient Instructions  Please continue: - Metformin 1000 mg 2x a day with meals - Levemir 60 units at bedtime  Please increase: - Humalog: 20 units for a smaller meal 25 units for a regular meal 30 units for a larger meal  Please start Trulicity 0.75 mg weekly. Let me know when you are close to running out to call in the higher dose to your pharmacy (1.5 mg).  Here today with concern of headaches He notes "a massive headache" for 3 weeks.  It seemed to get even worse 3-4 days ago.   3-4 weeks ago his vision was blurry, but he blamed this on his glucose being so high. Now that his glucose is a bit better his vision is back to normal- they have increased his dose of trulicity and it does seem to be helping   1-2 weeks ago he had a sharp pain in his head that woke him up during the night He also had an episode where he woke up and felt like his heart was fluttering, and he felt out of breath.  This occurred about a week ago as well  This is an unusual headache for him in both duration and severity He did get migraines in his teens, but not since then  He notes that if he lays quite still and does not move his head feels better.    No nausea or vomiting No illness such as fever, chills, cough, or other signs of illness  He has not had any sx of  aura  He is taking excedrin for his HA- he also tried tylenol   Lab Results  Component Value Date   HGBA1C 13.7 05/27/2017   Glucose this am was 150 His BP at home is generally pretty good when he does check it- 130/90 or less. He thinks it may be up a bit today as he is feeling nervous    BP Readings from Last 3 Encounters:  08/28/17 (!) 145/99  07/19/17 (!) 170/100  06/23/16 (!) 163/91     Patient Active Problem List   Diagnosis Date Noted  . Hyperlipidemia 07/19/2017  . Obesity 01/30/2017  . Urinary frequency 12/11/2016  . Insomnia 09/13/2016  . History of chicken pox   . History of shingles   . Preventative health care 08/14/2013  . PONV (postoperative nausea and vomiting)   . Anxiety   . History of kidney stones   . GERD (gastroesophageal reflux disease)   . Insulin dependent diabetes mellitus (HCC)   . Hypertension   . Osteoarthritis   . 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 Anxiety   . GERD (gastroesophageal reflux disease)  occasional - OTC as needed  . History of chicken pox   . History of kidney stones   . History of shingles   . Hypertension    under control with med., had been on med. since age 57  . Insomnia 09/13/2016  . Insulin dependent diabetes mellitus (HCC)    Follows with endocrinology, Dr Chestine Sporelark reports his last hgba1c was 7.3   . Non-insulin dependent type 2 diabetes mellitus (HCC)   . Obesity 01/30/2017  . Osteoarthritis    right knee  . PONV (postoperative nausea and vomiting)   . PONV (postoperative nausea and vomiting)   . Preventative health care 08/14/2013  . Tachycardia   . Urinary frequency 12/11/2016    Past Surgical History:  Procedure Laterality Date  . CHOLECYSTECTOMY  1980's  . CYSTOSCOPY/RETROGRADE/URETEROSCOPY/STONE EXTRACTION WITH BASKET Right 08/27/2003   and double J stent placement  . INGUINAL HERNIA REPAIR Left 1990's  . KNEE ARTHROSCOPY Left 1980's  . KNEE  ARTHROSCOPY Right 2013  . KNEE ARTHROSCOPY Right 06/30/2013   Procedure: RIGHT KNEE ARTHROSCOPY AND SYNOVECTOMY;  Surgeon: Velna OchsPeter G Dalldorf, MD;  Location: [facility];  Service: Orthopedics;  Laterality: Right;  . LITHOTRIPSY     multiple, b/l  . SHOULDER ADHESION RELEASE Left ` 1998  . SHOULDER ARTHROSCOPY W/ ROTATOR CUFF REPAIR Left 10/20/2004   x 4, had to have part of clavile removed. torn rotator cuff multiple times, had to place screws   . SHOULDER ARTHROSCOPY W/ SUPERIOR LABRAL ANTERIOR POSTERIOR LESION REPAIR Left 12/22/2004  . TOTAL KNEE ARTHROPLASTY Right 12/23/2012   x3, 2 arthroscopies and a TKR,  TOTAL KNEE ARTHROPLASTY;  Surgeon: Velna OchsPeter G Dalldorf, MD;  Location: [facility];  Service: Orthopedics;  Laterality: Right;  DEPUY, RNFA  . WRIST SURGERY Right    x 3, torn ligaments, pins placed then infected, then fused with plate    Social History   Tobacco Use  . Smoking status: Never Smoker  . Smokeless tobacco: Current User    Types: Snuff  Substance Use Topics  . Alcohol use: Yes    Comment: occasionally  . Drug use: No    Family History  Problem Relation Age of Onset  . COPD Mother        smoker  . Hypertension Mother   . Heart disease Mother        CHF  . Diabetes Father   . Heart disease Father        quintuple bypass  . Stroke Father   . Hypertension Father   . Peripheral vascular disease Father   . Hypertension Son        tachycardia  . Heart disease Maternal Grandfather   . Arthritis Paternal Grandmother   . Colon cancer Neg Hx   . Rectal cancer Neg Hx   . Stomach cancer Neg Hx     Allergies  Allergen Reactions  . Toradol [Ketorolac Tromethamine] Itching    Medication list has been reviewed and updated.  Current Outpatient Medications on File Prior to Visit  Medication Sig Dispense Refill  . carvedilol (COREG) 12.5 MG tablet Take 1 tablet (12.5 mg total) by mouth 2 (two) times daily with a meal. 60 tablet 6  . Dulaglutide (TRULICITY)  1.5 MG/0.5ML SOPN Inject 1.5mg  weekly 4 pen 1  . escitalopram (LEXAPRO) 20 MG tablet TAKE 1 TABLET BY MOUTH ONCE DAILY 30 tablet 2  . insulin aspart (NOVOLOG FLEXPEN) 100 UNIT/ML FlexPen Inject 20-30 units 3 times daily with meals  81 mL 0  . insulin detemir (LEVEMIR) 100 UNIT/ML injection Inject 0.6 mLs (60 Units total) into the skin at bedtime. 54 mL 3  . lansoprazole (PREVACID) 30 MG capsule Take 30 mg by mouth daily at 12 noon.    . loratadine (CLARITIN) 10 MG tablet Take 10 mg by mouth daily.    . metFORMIN (GLUCOPHAGE) 1000 MG tablet Take 1 tablet (1,000 mg total) by mouth 2 (two) times daily with a meal. 180 tablet 3   No current facility-administered medications on file prior to visit.     Review of Systems:  As per HPI- otherwise negative.   Physical Examination: Vitals:   08/28/17 0955  BP: (!) 145/99  Pulse: 91  Temp: 97.9 F (36.6 C)  SpO2: 97%   Vitals:   08/28/17 0955  Weight: 227 lb (103 kg)  Height: 6' (1.829 m)   Body mass index is 30.79 kg/m. Ideal Body Weight: Weight in (lb) to have BMI = 25: 183.9  GEN: WDWN, NAD, Non-toxic, A & O x 3, overweight, looks well HEENT: Atraumatic, Normocephalic. Neck supple. No masses, No LAD.  Bilateral TM wnl, oropharynx normal.  PEERL,EOMI.  No meningismus Ears and Nose: No external deformity. CV: RRR, No M/G/R. No JVD. No thrill. No extra heart sounds. PULM: CTA B, no wheezes, crackles, rhonchi. No retractions. No resp. distress. No accessory muscle use. ABD: S, NT, ND EXTR: No c/c/e NEURO Normal gait.  PSYCH: Normally interactive. Conversant. Not depressed or anxious appearing.  Calm demeanor.  Normal strength and sensation of all extremities today.  Normal romberg  Reflexes are 1+,absent in right knee but he is s/p knee replacement   Assessment and Plan: Worst headache of life - Plan: MR Brain W Wo Contrast, butalbital-acetaminophen-caffeine (FIORICET, ESGIC) 50-325-40 MG tablet, CBC, Sedimentation rate  Benign  essential HTN - Plan: lisinopril (PRINIVIL,ZESTRIL) 10 MG tablet  Type 2 diabetes mellitus with complication, with long-term current use of insulin (HCC) - Plan: Comprehensive metabolic panel  Acute nonintractable headache, unspecified headache type - Plan: MR Brain W Wo Contrast  Here today with an unusual headache for about 3 weeks Ordered MRI for him- will need to be approval  Add 10 mg of lisinopril for better BP control Will make sure that his electrolytes, etc are ok as he does have history of poorly controlled DM   Signed Abbe Amsterdam, MD  I was able to get authorization for his MRI- [id number] Received his labs- called pt to give him this update, should be able to get MRI tomorrow.   Will ask lab to add fractionate bilirubin Results for orders placed or performed in visit on 08/28/17  Comprehensive metabolic panel  Result Value Ref Range   Sodium 136 135 - 145 mEq/L   Potassium 4.0 3.5 - 5.1 mEq/L   Chloride 101 96 - 112 mEq/L   CO2 26 19 - 32 mEq/L   Glucose, Bld 123 (H) 70 - 99 mg/dL   BUN 10 6 - 23 mg/dL   Creatinine, Ser 5.62 0.40 - 1.50 mg/dL   Total Bilirubin 1.8 (H) 0.2 - 1.2 mg/dL   Alkaline Phosphatase 64 39 - 117 U/L   AST 16 0 - 37 U/L   ALT 21 0 - 53 U/L   Total Protein 7.1 6.0 - 8.3 g/dL   Albumin 4.3 3.5 - 5.2 g/dL   Calcium 9.8 8.4 - 13.0 mg/dL   GFR 865.78 >46.96 mL/min  CBC  Result Value Ref Range   WBC  6.9 4.0 - 10.5 K/uL   RBC 5.37 4.22 - 5.81 Mil/uL   Platelets 304.0 150.0 - 400.0 K/uL   Hemoglobin 16.7 13.0 - 17.0 g/dL   HCT 40.9 81.1 - 91.4 %   MCV 88.4 78.0 - 100.0 fl   MCHC 35.2 30.0 - 36.0 g/dL   RDW 78.2 95.6 - 21.3 %  Sedimentation rate  Result Value Ref Range   Sed Rate 4 0 - 20 mm/hr   Received his MRI on 1/25- called and LMOM with results, looks ok  Mr Laqueta Jean Wo Contrast  Result Date: 08/30/2017 CLINICAL DATA:  Worst headache of life EXAM: MRI HEAD WITHOUT AND WITH CONTRAST TECHNIQUE: Multiplanar, multiecho pulse sequences  of the brain and surrounding structures were obtained without and with intravenous contrast. CONTRAST:  20 mL MultiHance IV COMPARISON:  MRI 06/30/2004 FINDINGS: Brain: Ventricle size and cerebral volume normal. Negative for infarct, hemorrhage, mass. No evidence of demyelinating disease. Normal enhancement of the brain. Mild low lying cerebellar tonsil on the right measuring approximately 5 mm below the foramen magnum similar to the prior study. Left tonsil is at the foramen magnum. No evidence of mass-effect on the tonsil. Vascular: Normal arterial flow void Skull and upper cervical spine: Negative Sinuses/Orbits: Mild mucosal edema paranasal sinuses.  Normal orbit Other: None IMPRESSION: No acute abnormality Mild low lying cerebellar tonsil on the right unchanged from 2005 of doubtful significance. Otherwise negative Mild mucosal edema paranasal sinuses These results will be called to the ordering clinician or representative by the Radiologist Assistant, and communication documented in the PACS or zVision Dashboard. Electronically Signed   By: Marlan Palau M.D.   On: 08/30/2017 11:49

## 2017-08-28 ENCOUNTER — Other Ambulatory Visit: Payer: Self-pay | Admitting: Family Medicine

## 2017-08-28 ENCOUNTER — Ambulatory Visit (INDEPENDENT_AMBULATORY_CARE_PROVIDER_SITE_OTHER): Payer: Medicare HMO | Admitting: Family Medicine

## 2017-08-28 ENCOUNTER — Encounter: Payer: Self-pay | Admitting: Family Medicine

## 2017-08-28 VITALS — BP 145/99 | HR 91 | Temp 97.9°F | Ht 72.0 in | Wt 227.0 lb

## 2017-08-28 DIAGNOSIS — I10 Essential (primary) hypertension: Secondary | ICD-10-CM | POA: Diagnosis not present

## 2017-08-28 DIAGNOSIS — R51 Headache: Secondary | ICD-10-CM

## 2017-08-28 DIAGNOSIS — E118 Type 2 diabetes mellitus with unspecified complications: Secondary | ICD-10-CM

## 2017-08-28 DIAGNOSIS — R519 Headache, unspecified: Secondary | ICD-10-CM

## 2017-08-28 DIAGNOSIS — Z794 Long term (current) use of insulin: Secondary | ICD-10-CM

## 2017-08-28 LAB — COMPREHENSIVE METABOLIC PANEL
ALK PHOS: 64 U/L (ref 39–117)
ALT: 21 U/L (ref 0–53)
AST: 16 U/L (ref 0–37)
Albumin: 4.3 g/dL (ref 3.5–5.2)
BUN: 10 mg/dL (ref 6–23)
CHLORIDE: 101 meq/L (ref 96–112)
CO2: 26 mEq/L (ref 19–32)
Calcium: 9.8 mg/dL (ref 8.4–10.5)
Creatinine, Ser: 0.71 mg/dL (ref 0.40–1.50)
GFR: 121.92 mL/min (ref >60.00)
GLUCOSE: 123 mg/dL — AB (ref 70–99)
POTASSIUM: 4 meq/L (ref 3.5–5.1)
SODIUM: 136 meq/L (ref 135–145)
TOTAL PROTEIN: 7.1 g/dL (ref 6.0–8.3)
Total Bilirubin: 1.8 mg/dL — ABNORMAL HIGH (ref 0.2–1.2)

## 2017-08-28 LAB — CBC
HEMATOCRIT: 47.5 % (ref 39.0–52.0)
HEMOGLOBIN: 16.7 g/dL (ref 13.0–17.0)
MCHC: 35.2 g/dL (ref 30.0–36.0)
MCV: 88.4 fl (ref 78.0–100.0)
Platelets: 304 10*3/uL (ref 150.0–400.0)
RBC: 5.37 Mil/uL (ref 4.22–5.81)
RDW: 13.2 % (ref 11.5–15.5)
WBC: 6.9 10*3/uL (ref 4.0–10.5)

## 2017-08-28 LAB — SEDIMENTATION RATE: Sed Rate: 4 mm/hr (ref 0–20)

## 2017-08-28 MED ORDER — BUTALBITAL-APAP-CAFFEINE 50-325-40 MG PO TABS: 1.0000 | ORAL_TABLET | Freq: Four times a day (QID) | ORAL | 0 refills | Status: DC | PRN

## 2017-08-28 MED ORDER — LISINOPRIL 10 MG PO TABS: 10.0000 mg | ORAL_TABLET | Freq: Every day | ORAL | 3 refills | Status: DC

## 2017-08-28 NOTE — Patient Instructions (Signed)
Please go to the lab, and then keep your phone on. We will try and get your MRI done today or tomorrow If any dramatic worsening of your symptoms please seek emergency care We are going to add 10 mg of lisinopril for blood pressure control Also use the fiorcet as needed for headache- max 6 in 24 hours.  These can make you drowsy!    Please let me know if no word about your MRI in the next day

## 2017-08-29 ENCOUNTER — Encounter: Payer: Self-pay | Admitting: Family Medicine

## 2017-08-30 ENCOUNTER — Ambulatory Visit (HOSPITAL_COMMUNITY)
Admission: RE | Admit: 2017-08-30 | Discharge: 2017-08-30 | Disposition: A | Discharge status: Home / Self Care | Payer: Medicare HMO | Source: Ambulatory Visit | Attending: Family Medicine | Admitting: Family Medicine

## 2017-08-30 DIAGNOSIS — R600 Localized edema: Secondary | ICD-10-CM | POA: Insufficient documentation

## 2017-08-30 DIAGNOSIS — R51 Headache: Secondary | ICD-10-CM | POA: Insufficient documentation

## 2017-08-30 DIAGNOSIS — R519 Headache, unspecified: Secondary | ICD-10-CM

## 2017-08-30 MED ORDER — GADOBENATE DIMEGLUMINE 529 MG/ML IV SOLN
20.0000 mL | Freq: Once | INTRAVENOUS | Status: AC | PRN
  Administered 2017-08-30: 20 mL via INTRAVENOUS

## 2017-09-02 ENCOUNTER — Encounter: Payer: Self-pay | Admitting: Family Medicine

## 2017-09-02 DIAGNOSIS — R519 Headache, unspecified: Secondary | ICD-10-CM

## 2017-09-02 DIAGNOSIS — J0110 Acute frontal sinusitis, unspecified: Secondary | ICD-10-CM

## 2017-09-02 DIAGNOSIS — R51 Headache: Principal | ICD-10-CM

## 2017-09-02 DIAGNOSIS — G8929 Other chronic pain: Secondary | ICD-10-CM

## 2017-09-03 ENCOUNTER — Encounter: Payer: Self-pay | Admitting: Family Medicine

## 2017-09-03 ENCOUNTER — Encounter: Payer: Self-pay | Admitting: Internal Medicine

## 2017-09-03 ENCOUNTER — Other Ambulatory Visit: Payer: Self-pay

## 2017-09-03 MED ORDER — INSULIN ASPART 100 UNIT/ML FLEXPEN: PEN_INJECTOR | SUBCUTANEOUS | 0 refills | Status: DC

## 2017-09-03 MED ORDER — AMOXICILLIN 500 MG PO CAPS: 1000.0000 mg | ORAL_CAPSULE | Freq: Two times a day (BID) | ORAL | 0 refills | Status: DC

## 2017-09-03 NOTE — Addendum Note (Signed)
Addended by: Abbe AmsterdamOPLAND, Equilla Que C on: 09/03/2017 03:41 PM   Modules accepted: Orders

## 2017-09-06 ENCOUNTER — Encounter: Payer: Self-pay | Admitting: Family Medicine

## 2017-09-06 MED ORDER — TOPIRAMATE 50 MG PO TABS: ORAL_TABLET | ORAL | 3 refills | Status: DC

## 2017-09-09 ENCOUNTER — Encounter: Payer: Self-pay | Admitting: Family Medicine

## 2017-09-09 DIAGNOSIS — R51 Headache: Principal | ICD-10-CM

## 2017-09-09 DIAGNOSIS — R519 Headache, unspecified: Secondary | ICD-10-CM

## 2017-09-24 ENCOUNTER — Ambulatory Visit: Payer: Medicare HMO | Admitting: Internal Medicine

## 2017-09-24 NOTE — Progress Notes (Signed)
[facility] [address], [address] [place], [place] [address] [phone] [phone]  Date:  09/25/2017   Name:  Sean Horn   DOB:  08/19/1960   MRN:  [id number]  PCP:  Bradd CanaryBlyth, Stacey A, MD    Chief Complaint: Headache (c/o still having bad headaches. )   History of Present Illness:  Sean Horn is a 57 y.o. very pleasant male patient who presents with the following:  I saw him on 1/23 with headaches- we got an MR of the brain and referred him to neurology, I started him on topamax He has planned on seeing Dr. Allena KatzPatel who he knows personally- however he has not been able to get an appt anytime soon even so-  He has an appt with Dr. Allena KatzPatel for April per his report However when I called [place] [facility] they did not have anything scheduled He does have an app[facility] on 3/25 with [facility]  He does relate that he had some disc issues is his neck in the past- wonders if this might be related.  However he is not having any pain, tingling or numbness down his arms at all  He describes a pressure type HA. No nausea or vomiting, no aura No vision or hearing change Laying down flat and extending his neck seems to help some otherwise nothing seems to make his HA better   We have tried fiorcet as well  No fever or chills He is otherwise feeling ok   BP Readings from Last 3 Encounters:  09/25/17 124/84  08/28/17 (!) 145/99  07/19/17 (!) 170/100   We tried treating him with abx for possible sinus pain We have him currently on topamax 50 mg but this does not seem to be helping him   He is allergic to toradol  He has had this HA for about 7 weeks now- it is present every day although it will wax and wane We are tempted to try prednisone, but pt notes that this always sends his glucose very high- his DM is poorly controlled  Lab Results  Component Value Date   HGBA1C 13.7 05/27/2017    Patient Active Problem List   Diagnosis  Date Noted  . Hyperlipidemia 07/19/2017  . Obesity 01/30/2017  . Urinary frequency 12/11/2016  . Insomnia 09/13/2016  . History of chicken pox   . History of shingles   . Preventative health care 08/14/2013  . PONV (postoperative nausea and vomiting)   . Anxiety   . History of kidney stones   . GERD (gastroesophageal reflux disease)   . Insulin dependent diabetes mellitus (HCC)   . Hypertension   . Osteoarthritis   . 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 Anxiety   . GERD (gastroesophageal reflux disease)    occasional - OTC as needed  . History of chicken pox   . History of kidney stones   . History of shingles   . Hypertension    under control with med., had been on med. since age 57  . Insomnia 09/13/2016  . Insulin dependent diabetes mellitus (HCC)    Follows with endocrinology, Dr Chestine Sporelark reports his last hgba1c was 7.3   . Non-insulin dependent type 2 diabetes mellitus (HCC)   . Obesity 01/30/2017  . Osteoarthritis    right knee  . PONV (postoperative nausea and vomiting)   .  PONV (postoperative nausea and vomiting)   . Preventative health care 08/14/2013  . Tachycardia   . Urinary frequency 12/11/2016    Past Surgical History:  Procedure Laterality Date  . CHOLECYSTECTOMY  1980's  . CYSTOSCOPY/RETROGRADE/URETEROSCOPY/STONE EXTRACTION WITH BASKET Right 08/27/2003   and double J stent placement  . INGUINAL HERNIA REPAIR Left 1990's  . KNEE ARTHROSCOPY Left 1980's  . KNEE ARTHROSCOPY Right 2013  . KNEE ARTHROSCOPY Right 06/30/2013   Procedure: RIGHT KNEE ARTHROSCOPY AND SYNOVECTOMY;  Surgeon: Velna Ochs, MD;  Location: [facility];  Service: Orthopedics;  Laterality: Right;  . LITHOTRIPSY     multiple, b/l  . SHOULDER ADHESION RELEASE Left ` 1998  . SHOULDER ARTHROSCOPY W/ ROTATOR CUFF REPAIR Left 10/20/2004   x 4, had to have part of clavile removed. torn rotator cuff multiple times, had to place  screws   . SHOULDER ARTHROSCOPY W/ SUPERIOR LABRAL ANTERIOR POSTERIOR LESION REPAIR Left 12/22/2004  . TOTAL KNEE ARTHROPLASTY Right 12/23/2012   x3, 2 arthroscopies and a TKR,  TOTAL KNEE ARTHROPLASTY;  Surgeon: Velna Ochs, MD;  Location: [facility];  Service: Orthopedics;  Laterality: Right;  DEPUY, RNFA  . WRIST SURGERY Right    x 3, torn ligaments, pins placed then infected, then fused with plate    Social History   Tobacco Use  . Smoking status: Never Smoker  . Smokeless tobacco: Current User    Types: Snuff  Substance Use Topics  . Alcohol use: Yes    Comment: occasionally  . Drug use: No    Family History  Problem Relation Age of Onset  . COPD Mother        smoker  . Hypertension Mother   . Heart disease Mother        CHF  . Diabetes Father   . Heart disease Father        quintuple bypass  . Stroke Father   . Hypertension Father   . Peripheral vascular disease Father   . Hypertension Son        tachycardia  . Heart disease Maternal Grandfather   . Arthritis Paternal Grandmother   . Colon cancer Neg Hx   . Rectal cancer Neg Hx   . Stomach cancer Neg Hx     Allergies  Allergen Reactions  . Toradol [Ketorolac Tromethamine] Itching    Medication list has been reviewed and updated.  Current Outpatient Medications on File Prior to Visit  Medication Sig Dispense Refill  . butalbital-acetaminophen-caffeine (FIORICET, ESGIC) 50-325-40 MG tablet Take 1-2 tablets by mouth every 6 (six) hours as needed for headache. 20 tablet 0  . carvedilol (COREG) 12.5 MG tablet Take 1 tablet (12.5 mg total) by mouth 2 (two) times daily with a meal. 60 tablet 6  . Dulaglutide (TRULICITY) 1.5 MG/0.5ML SOPN Inject 1.5mg  weekly 4 pen 1  . escitalopram (LEXAPRO) 20 MG tablet TAKE 1 TABLET BY MOUTH ONCE DAILY 30 tablet 2  . insulin aspart (NOVOLOG FLEXPEN) 100 UNIT/ML FlexPen Inject 20-30 units 3 times daily with meals 81 mL 0  . insulin detemir (LEVEMIR) 100 UNIT/ML injection Inject  0.6 mLs (60 Units total) into the skin at bedtime. 54 mL 3  . lansoprazole (PREVACID) 30 MG capsule Take 30 mg by mouth daily at 12 noon.    Marland Kitchen lisinopril (PRINIVIL,ZESTRIL) 10 MG tablet Take 1 tablet (10 mg total) by mouth daily. 30 tablet 3  . loratadine (CLARITIN) 10 MG tablet Take 10 mg by mouth daily.    Marland Kitchen  metFORMIN (GLUCOPHAGE) 1000 MG tablet Take 1 tablet (1,000 mg total) by mouth 2 (two) times daily with a meal. 180 tablet 3  . topiramate (TOPAMAX) 50 MG tablet Take 1/2 tablet at bedtime for one week, then increase to a whole tablet 30 tablet 3  . amoxicillin (AMOXIL) 500 MG capsule Take 2 capsules (1,000 mg total) by mouth 2 (two) times daily. (Patient not taking: Reported on 09/25/2017) 40 capsule 0   No current facility-administered medications on file prior to visit.     Review of Systems:  As per HPI- otherwise negative.   Physical Examination: Vitals:   09/25/17 1230  BP: 124/84  Pulse: 94  Temp: 97.6 F (36.4 C)  SpO2: 98%   Vitals:   09/25/17 1230  Weight: 225 lb 12.8 oz (102.4 kg)  Height: 6' (1.829 m)   Body mass index is 30.62 kg/m. Ideal Body Weight: Weight in (lb) to have BMI = 25: 183.9  GEN: WDWN, NAD, Non-toxic, A & O x 3, overweight, otherwise looks well  HEENT: Atraumatic, Normocephalic. Neck supple. No masses, No LAD.  Bilateral TM wnl, oropharynx normal.  PEERL,EOMI.   Ears and Nose: No external deformity. CV: RRR, No M/G/R. No JVD. No thrill. No extra heart sounds. PULM: CTA B, no wheezes, crackles, rhonchi. No retractions. No resp. distress. No accessory muscle use. ABD: S, NT, ND EXTR: No c/c/e NEURO Normal gait. Normal strength, sensation and DTR of all extremities Normal romberg Cannot find any tenderness in his neck, head, or face on exam PSYCH: Normally interactive. Conversant. Not depressed or anxious appearing.  Calm demeanor.    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 Otherwise negative Mild mucosal edema paranasal sinuses  Will taper off and stop topamax as it is not helping Will try a muscle relaxer instead- robaxin Will send a message to Dr. Lucia Gaskins and see if she has any other suggestions for now   Signed Abbe Amsterdam, MD

## 2017-09-25 ENCOUNTER — Encounter: Payer: Self-pay | Admitting: Family Medicine

## 2017-09-25 ENCOUNTER — Ambulatory Visit (INDEPENDENT_AMBULATORY_CARE_PROVIDER_SITE_OTHER): Payer: Medicare HMO | Admitting: Family Medicine

## 2017-09-25 VITALS — BP 124/84 | HR 94 | Temp 97.6°F | Ht 72.0 in | Wt 225.8 lb

## 2017-09-25 DIAGNOSIS — R51 Headache: Secondary | ICD-10-CM | POA: Diagnosis not present

## 2017-09-25 DIAGNOSIS — R519 Headache, unspecified: Secondary | ICD-10-CM

## 2017-09-25 MED ORDER — METHOCARBAMOL 500 MG PO TABS: 500.0000 mg | ORAL_TABLET | Freq: Three times a day (TID) | ORAL | 0 refills | Status: DC | PRN

## 2017-09-25 NOTE — Patient Instructions (Addendum)
You can taper off the topamax since it is not helping Take a 1/2 tablet for 4 or 5 days and then stop We will try robaxin- a muscle relaxer- for you today.  Pease let me know how this works for you You have a neurolgoy appt on 3/25 with Dr[facility]- I will touch base with her and see is she has any ideas in the meantime

## 2017-09-29 ENCOUNTER — Encounter: Payer: Self-pay | Admitting: Family Medicine

## 2017-10-01 ENCOUNTER — Ambulatory Visit: Payer: Medicare HMO | Admitting: Neurology

## 2017-10-01 ENCOUNTER — Encounter: Payer: Self-pay | Admitting: Neurology

## 2017-10-01 VITALS — BP 137/87 | HR 113 | Ht 72.0 in | Wt 225.6 lb

## 2017-10-01 DIAGNOSIS — G8929 Other chronic pain: Secondary | ICD-10-CM

## 2017-10-01 DIAGNOSIS — R51 Headache: Secondary | ICD-10-CM | POA: Diagnosis not present

## 2017-10-01 DIAGNOSIS — M542 Cervicalgia: Secondary | ICD-10-CM | POA: Diagnosis not present

## 2017-10-01 DIAGNOSIS — R519 Headache, unspecified: Secondary | ICD-10-CM

## 2017-10-01 DIAGNOSIS — M5412 Radiculopathy, cervical region: Secondary | ICD-10-CM | POA: Diagnosis not present

## 2017-10-01 DIAGNOSIS — M4802 Spinal stenosis, cervical region: Secondary | ICD-10-CM | POA: Diagnosis not present

## 2017-10-01 NOTE — Patient Instructions (Signed)
MRI cervical spine Continue Muscle relaxer Nerve Blocks today

## 2017-10-01 NOTE — Progress Notes (Signed)
[id number]    Provider:  Dr Lucia Gaskins Referring Provider: Bradd Canary, MD Primary Care Physician:  Bradd Canary, MD  CC:  headache  HPI:  Sean Horn is a 57 y.o. male here as a referral from Dr. Abner Greenspan for headache. PMHx HTN, uncontrolled DM.  He had migraines since a teenager. Started 2 months ago in the setting of hyperglycemia, he had blurred vision for a week improved since glucose improved. Started in the morning like a hangover and stayed dull for the rest of the day, since then continuous, goes to sleep with it and wakes up with it. Muscle relaxers have helped. He woke one night with sharp head pain left eye. It is a dull ache that worsens sometimes, bending head back helps. Layingin bed helps. Headache is in the back of the head. Dull not pounding or throbbing. No light or sound sensitivity. No aura. He has some neck pain. Not burning or sharp. No significant snoring, sleep study was borderline, not excessively tired during the day. He has some shoulder pain, he has had 4 shoulder surgeries. He has some numbness occ in the left hand when he wakes up mostly on left. No pain on neck movement or shooting pain in the neck. Not migrainous and not similar to previous migraines. No bowel or bladder changes, no ataxia or falls. No other focal neurologic deficits, associated symptoms, inciting events or modifiable factors.  Reviewed notes, labs and imaging from outside physicians, which showed:  MRI c-spine 2005   1. Stable low lying cerebellar tonsils without beaking.   2. Disc bulges, disc herniations, spur formation and facet hypertrophy, as described above. The most significant finding on the right is at the C3-C4 level where uncinate spur formation is producing marked neural foraminal stenosis. The most significant finding on the left is at the C5-C6 level level where a combination of disc herniation and facet hypertrophy are producing mild to moderate  foraminal stenosis and mild posterior displacement and minimal flattening of the spinal cord.  Reviewed images MRI cervical spine which showed multi-level degenerative changes and moderate canal stenosis (agree with report above):   HgbA1c 13.7 05/27/2017  Reviewed referring physician's notes.  He presented with a "massive headache".  Seem to get worse every 3-4 days.  He reported blurry vision but his glucose was high, vision back to normal after increasing his dose of Trulicity which has seems to be helping.  1-2 weeks ago he had a sharp pain in his head that woke him up during the night.  He also had an episode where he woke up and felt like his heart was fluttering and he felt out of breath.  Unusual headache for him in both duration and severity.  He did get migraines in his teens but none since then.  Feels better when not moving his head.  No nausea or vomiting.  No history of aura.  Personally reviewed MRI images of the brain which showed a low-lying cerebellar tonsil on the right measuring 5 mm stable no evidence of mass-effect on the tonsil.  Unchanged from 2005.  "Of doubtful significance".  MRI brain unrevealing for etiology, has low lying cerebellar tonsil that is stable unlikely causing symptoms (reviewed MRi brain images with patient and explained finding)   Review of Systems: Patient complains of symptoms per HPI as well as the following symptoms: anxiety, headache, sleepiness. Pertinent negatives and positives per HPI. All others negative.   Social History   Socioeconomic History  .  Marital status: Single    Spouse name: Not on file  . Number of children: 2  . Years of education: Not on file  . Highest education level: High school graduate  Social Needs  . Financial resource strain: Not on file  . Food insecurity - worry: Not on file  . Food insecurity - inability: Not on file  . Transportation needs - medical: Not on file  . Transportation needs - non-medical: Not on  file  Occupational History  . Occupation: Disabled  Tobacco Use  . Smoking status: Never Smoker  . Smokeless tobacco: Current User    Types: Snuff  Substance and Sexual Activity  . Alcohol use: No    Frequency: Never  . Drug use: No  . Sexual activity: Yes    Comment: currently applying for disability due to knee, lives with girfriend, no dietary restrictions, previously Games developer  Other Topics Concern  . Not on file  Social History Narrative   Lives at home with his father (he takes care of his father)   Right handed   Drinks 2 cups of caffeine daily    Family History  Problem Relation Age of Onset  . COPD Mother        smoker  . Hypertension Mother   . Heart disease Mother        CHF  . Diabetes Father   . Heart disease Father        quintuple bypass  . Stroke Father   . Hypertension Father   . Peripheral vascular disease Father   . Hypertension Son        tachycardia  . Heart disease Maternal Grandfather   . Arthritis Paternal Grandmother   . Colon cancer Neg Hx   . Rectal cancer Neg Hx   . Stomach cancer Neg Hx     Past Medical History:  Diagnosis Date  . Anxiety   . GERD (gastroesophageal reflux disease)    occasional - OTC as needed  . History of chicken pox   . History of kidney stones   . History of shingles   . Hypertension    under control with med., had been on med. since age 58  . Insomnia 09/13/2016  . Insulin dependent diabetes mellitus (HCC)    Follows with endocrinology, Dr Chestine Spore reports his last hgba1c was 7.3   . Non-insulin dependent type 2 diabetes mellitus (HCC)   . Obesity 01/30/2017  . Osteoarthritis    right knee  . PONV (postoperative nausea and vomiting)   . PONV (postoperative nausea and vomiting)   . Preventative health care 08/14/2013  . Tachycardia   . Urinary frequency 12/11/2016    Past Surgical History:  Procedure Laterality Date  . CHOLECYSTECTOMY  1980's  . CYSTOSCOPY/RETROGRADE/URETEROSCOPY/STONE EXTRACTION WITH  BASKET Right 08/27/2003   and double J stent placement  . INGUINAL HERNIA REPAIR Left 1990's  . KNEE ARTHROSCOPY Left 1980's  . KNEE ARTHROSCOPY Right 2013  . KNEE ARTHROSCOPY Right 06/30/2013   Procedure: RIGHT KNEE ARTHROSCOPY AND SYNOVECTOMY;  Surgeon: Velna Ochs, MD;  Location: [facility];  Service: Orthopedics;  Laterality: Right;  . LITHOTRIPSY     multiple, b/l  . SHOULDER ADHESION RELEASE Left ` 1998  . SHOULDER ARTHROSCOPY W/ ROTATOR CUFF REPAIR Left 10/20/2004   x 4, had to have part of clavile removed. torn rotator cuff multiple times, had to place screws   . SHOULDER ARTHROSCOPY W/ SUPERIOR LABRAL ANTERIOR POSTERIOR LESION REPAIR Left  12/22/2004  . TOTAL KNEE ARTHROPLASTY Right 12/23/2012   x3, 2 arthroscopies and a TKR,  TOTAL KNEE ARTHROPLASTY;  Surgeon: Velna OchsPeter G Dalldorf, MD;  Location: [facility];  Service: Orthopedics;  Laterality: Right;  DEPUY, RNFA  . WRIST SURGERY Right    x 3, torn ligaments, pins placed then infected, then fused with plate    Current Outpatient Medications  Medication Sig Dispense Refill  . carvedilol (COREG) 12.5 MG tablet Take 1 tablet (12.5 mg total) by mouth 2 (two) times daily with a meal. 60 tablet 6  . Dulaglutide (TRULICITY) 1.5 MG/0.5ML SOPN Inject 1.5mg  weekly 4 pen 1  . escitalopram (LEXAPRO) 20 MG tablet TAKE 1 TABLET BY MOUTH ONCE DAILY 30 tablet 2  . insulin aspart (NOVOLOG FLEXPEN) 100 UNIT/ML FlexPen Inject 20-30 units 3 times daily with meals 81 mL 0  . insulin detemir (LEVEMIR) 100 UNIT/ML injection Inject 0.6 mLs (60 Units total) into the skin at bedtime. 54 mL 3  . lansoprazole (PREVACID) 30 MG capsule Take 30 mg by mouth daily at 12 noon.    Marland Kitchen. lisinopril (PRINIVIL,ZESTRIL) 10 MG tablet Take 1 tablet (10 mg total) by mouth daily. 30 tablet 3  . loratadine (CLARITIN) 10 MG tablet Take 10 mg by mouth daily.    . metFORMIN (GLUCOPHAGE) 1000 MG tablet Take 1 tablet (1,000 mg total) by mouth 2 (two) times daily with a  meal. 180 tablet 3  . methocarbamol (ROBAXIN) 500 MG tablet Take 1 tablet (500 mg total) by mouth every 8 (eight) hours as needed for muscle spasms. 20 tablet 0   No current facility-administered medications for this visit.     Allergies as of 10/01/2017 - Review Complete 10/01/2017  Allergen Reaction Noted  . Toradol [ketorolac tromethamine] Itching 06/30/2012    Vitals: BP 137/87 (BP Location: Right Arm, Patient Position: Sitting)   Pulse (!) 113   Ht 6' (1.829 m)   Wt 225 lb 9.6 oz (102.3 kg)   BMI 30.60 kg/m  Last Weight:  Wt Readings from Last 1 Encounters:  10/01/17 225 lb 9.6 oz (102.3 kg)   Last Height:   Ht Readings from Last 1 Encounters:  10/01/17 6' (1.829 m)   Physical exam: Exam: Gen: NAD, conversant, well nourised, obese, well groomed                     CV: RRR, no MRG. No Carotid Bruits. No peripheral edema, warm, nontender Eyes: Conjunctivae clear without exudates or hemorrhage  Neuro: Detailed Neurologic Exam  Speech:    Speech is normal; fluent and spontaneous with normal comprehension.  Cognition:    The patient is oriented to person, place, and time;     recent and remote memory intact;     language fluent;     normal attention, concentration,     fund of knowledge Cranial Nerves:    The pupils are equal, round, and reactive to light. The fundi are normal and spontaneous venous pulsations are present. Visual fields are full to finger confrontation. Extraocular movements are intact. Trigeminal sensation is intact and the muscles of mastication are normal. The face is symmetric. The palate elevates in the midline. Hearing intact. Voice is normal. Shoulder shrug is normal. The tongue has normal motion without fasciculations.   Coordination:    Normal finger to nose and heel to shin. Normal rapid alternating movements.   Gait:    Heel-toe and tandem gait are normal.   Motor Observation:  No asymmetry, no atrophy, and no involuntary movements  noted. Tone:    Normal muscle tone.    Posture:    Posture is normal. normal erect    Strength:    Strength is V/V in the upper and lower limbs.      Sensation: intact to LT     Reflex Exam:  DTR's:    Right patellar hypo (surgical) otherwise deep tendon reflexes in the upper and lower extremities are normal bilaterally.   Toes:    The toes are downgoing bilaterally.   Clonus:    Clonus is absent.     Assessment/Plan:  57 year old with occipital headache. PMHx HTN, uncontrolled DM, remote migraines. MRI brain unrevealing for etiology, has low lying cerebellar tonsil that is stable unlikely causing symptoms. However in the past had MRI c-spine which showed multilevel disease and cervical cord flattening - occipital neck and head pain symptoms may be due to degenerative changes in the neck.  - Reviewed images MRI cervical spine which showed multi-level degenerative changes and moderate canal stenosis in 2005, definitely need repeat to evaluate for cervical stenosis and arthritic changes for injections vs surgical intervention.  - Patient's headache improves with positional changes in the neck, given occipital location and previous MRI findings, his symptoms likely related to his cervical degenerative disease. Will send to Physical Therapy: Cervical myofascial pai contributing to occipital headaches and cervicalgia. Please evaluate and treat including dry needling, stretching, strengthening, manual therapy/massage, heating, TENS unit, exercising and strengthening as clinically warranted as well as any other modality as recommended by evaluation.  - nerve blocks today  - continue muscle relaxer  Performed by Dr. Lucia Gaskins M.D.  All procedures a documented below were medically necessary, reasonable and appropriate based on the patient's history, medical diagnosis and physician opinion. Verbal informed consent was obtained from the patient, patient was informed of potential risk of procedure,  including bruising, bleeding, hematoma formation, infection, muscle weakness, muscle pain, numbness, transient hypertension, transient hyperglycemia and transient insomnia among others. All areas injected were topically clean with isopropyl rubbing alcohol. Nonsterile nonlatex gloves were worn during the procedure.  1. Greater occipital nerve block [phone]). The greater occipital nerve site was identified at the nuchal line medial to the occipital artery. Medication was injected into the left and right occipital nerve areas and suboccipital areas. Patient's condition is associated with inflammation of the greater occipital nerve and associated multiple groups. Injection was deemed medically necessary, reasonable and appropriate. Injection represents a separate and unique surgical service.  2. Lesser occipital nerve block [phone]). The lesser occipital nerve site was identified approximately 2 cm lateral to the greater occipital nerve. Occasion was injected into the left and right occipital nerve areas. Patient's condition is associated with inflammation of the lesser occipital nerve and associated muscle groups. Injection was deemed medically necessary, reasonable and appropriate. Injection represents a separate and unique surgical service.  Cc: Dr. Rogelia Rohrer  Orders Placed This Encounter  Procedures  . MR CERVICAL SPINE WO CONTRAST  . Ambulatory referral to Physical Therapy   Discussed: To prevent or relieve headaches, try the following: Cool Compress. Lie down and place a cool compress on your head.  Avoid headache triggers. If certain foods or odors seem to have triggered your migraines in the past, avoid them. A headache diary might help you identify triggers.  Include physical activity in your daily routine. Try a daily walk or other moderate aerobic exercise.  Manage stress. Find healthy ways to cope with  the stressors, such as delegating tasks on your to-do list.  Practice relaxation techniques.  Try deep breathing, yoga, massage and visualization.  Eat regularly. Eating regularly scheduled meals and maintaining a healthy diet might help prevent headaches. Also, drink plenty of fluids.  Follow a regular sleep schedule. Sleep deprivation might contribute to headaches Consider biofeedback. With this mind-body technique, you learn to control certain bodily functions - such as muscle tension, heart rate and blood pressure - to prevent headaches or reduce headache pain.    Proceed to emergency room if you experience new or worsening symptoms or symptoms do not resolve, if you have new neurologic symptoms or if headache is severe, or for any concerning symptom.   Naomie Dean, MD  [facility] [address] [place], [place] [address]  Phone [phone] Fax [phone]

## 2017-10-01 NOTE — Progress Notes (Signed)
Trigger point injections: 0.5% Bupivocaine prepared 6 mL  LOT: [id number] EXP: 12/05/2018  2% Lidocaine prepared 6 mL LOT: [id number] EXP: 04/07/2019 //BCrn

## 2017-10-02 ENCOUNTER — Encounter: Payer: Self-pay | Admitting: Neurology

## 2017-10-07 ENCOUNTER — Other Ambulatory Visit: Payer: Self-pay | Admitting: Neurology

## 2017-10-07 ENCOUNTER — Telehealth: Payer: Self-pay | Admitting: Neurology

## 2017-10-07 DIAGNOSIS — M5481 Occipital neuralgia: Secondary | ICD-10-CM

## 2017-10-07 MED ORDER — GABAPENTIN 300 MG PO CAPS: 300.0000 mg | ORAL_CAPSULE | Freq: Three times a day (TID) | ORAL | 11 refills | Status: DC

## 2017-10-07 NOTE — Telephone Encounter (Signed)
Sean Horn, patient is asking if Sean Horn has any openings sooner than Sean Horn? He is scheduled there 3-15 but willing to go to Sean Horn. thanks

## 2017-10-07 NOTE — Telephone Encounter (Signed)
Okay thank you. I sent a mychart message to the patient.

## 2017-10-16 ENCOUNTER — Other Ambulatory Visit: Payer: Self-pay | Admitting: Internal Medicine

## 2017-10-18 ENCOUNTER — Ambulatory Visit
Admission: RE | Admit: 2017-10-18 | Discharge: 2017-10-18 | Disposition: A | Discharge status: Home / Self Care | Payer: Medicare HMO | Source: Ambulatory Visit | Attending: Neurology | Admitting: Neurology

## 2017-10-18 DIAGNOSIS — M542 Cervicalgia: Secondary | ICD-10-CM | POA: Diagnosis not present

## 2017-10-18 DIAGNOSIS — G8929 Other chronic pain: Secondary | ICD-10-CM

## 2017-10-18 DIAGNOSIS — M4802 Spinal stenosis, cervical region: Secondary | ICD-10-CM | POA: Diagnosis not present

## 2017-10-18 DIAGNOSIS — M5412 Radiculopathy, cervical region: Secondary | ICD-10-CM

## 2017-10-21 ENCOUNTER — Encounter: Payer: Self-pay | Admitting: Neurology

## 2017-10-22 ENCOUNTER — Encounter: Payer: Self-pay | Admitting: Neurology

## 2017-10-23 ENCOUNTER — Telehealth: Payer: Self-pay | Admitting: Neurology

## 2017-10-23 DIAGNOSIS — G589 Mononeuropathy, unspecified: Secondary | ICD-10-CM

## 2017-10-23 DIAGNOSIS — M5481 Occipital neuralgia: Secondary | ICD-10-CM

## 2017-10-23 DIAGNOSIS — S149XXA Injury of unspecified nerves of neck, initial encounter: Secondary | ICD-10-CM

## 2017-10-23 MED ORDER — GABAPENTIN 600 MG PO TABS: 600.0000 mg | ORAL_TABLET | Freq: Three times a day (TID) | ORAL | 11 refills | Status: DC

## 2017-10-23 MED ORDER — GABAPENTIN 300 MG PO CAPS: 600.0000 mg | ORAL_CAPSULE | Freq: Three times a day (TID) | ORAL | 11 refills | Status: DC

## 2017-10-23 NOTE — Telephone Encounter (Signed)
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 I suggest we send him to Dr. Ollen BowlHarkins for intervention at Neurosurgery. In the meantime we can increase his gabapentin to 600mg  tid. thanks     IMPRESSION: This MRI of the cervical spine without contrast shows the following: 1.    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 2.    At C2-C3, there is borderline spinal stenosis but no foraminal narrowing and no nerve root compression. 3.    At C3-C4, there is mild spinal stenosis and moderately severe right foraminal narrowing that could lead to right C4 nerve root compression 4.    At [facility],There is moderately severe spinal stenosis and moderate right foraminal narrowing.  There did not appear to be nerve root compression.  This level has significantly progressed when compared to the previous MRI. 5.    At C5-C6, there is a mild spinal stenosis due to left paramedian disc herniation distorting the left side of the thecal sac.  There is moderately severe left foraminal narrowing that could lead to left C6 nerve root compression 6.    At C6-C7 and C7-T1, there are mild degenerative changes but no foraminal narrowing or spinal stenosis.

## 2017-10-23 NOTE — Addendum Note (Signed)
Addended by: Bertram SavinULBERTSON, Asheton Scheffler L on: 10/23/2017 09:20 AM   Modules accepted: Orders

## 2017-10-23 NOTE — Addendum Note (Signed)
Addended by: Bertram SavinULBERTSON, BETHANY L on: 10/23/2017 04:29 PM   Modules accepted: Orders

## 2017-10-23 NOTE — Telephone Encounter (Signed)
Referral to Dr. Ollen BowlHarkins placed for cervical injections due to pinched nerve.   Received pharmacy notification that the new prescription for Gabapentin 600 mg TID using the 300 mg capsules exceeds pt daily allowed limit. Prescription reordered to Gabapentin 600 mg TID using 600 mg tablets. Spoke with patient and made sure he was aware that while he could use the 300 mg for now and take 2 capsules three times daily, when he picks up the new prescription it will be 600 mg tablets and he will only be taking 1 tablet three times daily. He verbalized understanding and appreciation.

## 2017-10-23 NOTE — Telephone Encounter (Signed)
Called pt and discussed MRI results. He was made aware that he has multi-level degenerative changes which is leading to a pinched nerve and may be causing the pain in the right occipital area. Dr. Lucia GaskinsAhern has suggested a referral to Dr. Ollen BowlHarkins for intervention. She has also offered to increase pt's Gabapentin to 600 mg (2 capsules) three times daily. The patient verbalized agreement to all. He stated that so far he is doing ok on the Gabapentin at 300 mg (1 capsule) three times daily. He will call if there are any side effects, sleepiness with Gabapentin 600 mg three times daily. He had no questions or concerns.

## 2017-10-23 NOTE — Telephone Encounter (Signed)
Thanks did you refer him to dr Norville Haggardharkin for injections? He is the pain specialist there thanks

## 2017-10-28 ENCOUNTER — Ambulatory Visit: Payer: Self-pay | Admitting: Neurology

## 2017-10-31 NOTE — Telephone Encounter (Signed)
Called and spoke to patient and told him that referral was still in review he should get a call soon. He relayed ok he just want to make sure he  did need to do anything.

## 2017-11-11 ENCOUNTER — Encounter: Payer: Self-pay | Admitting: Neurology

## 2017-11-12 ENCOUNTER — Telehealth (INDEPENDENT_AMBULATORY_CARE_PROVIDER_SITE_OTHER): Payer: Self-pay | Admitting: Physical Medicine and Rehabilitation

## 2017-11-12 NOTE — Telephone Encounter (Signed)
Submitted prior auth on [employer] website, case is pending.

## 2017-11-12 NOTE — Telephone Encounter (Signed)
Patient needs auth for [id number] and scheduling ASAP.

## 2017-11-12 NOTE — Telephone Encounter (Signed)
Left message on pt phone stating the process of getting auth with insurance.

## 2017-11-12 NOTE — Telephone Encounter (Signed)
Ok for direct C7-T1 interlam when able, I can read and see MRI already

## 2017-11-19 ENCOUNTER — Other Ambulatory Visit: Payer: Self-pay | Admitting: Family Medicine

## 2017-11-19 ENCOUNTER — Ambulatory Visit: Payer: Medicare HMO | Admitting: Internal Medicine

## 2017-11-19 NOTE — Telephone Encounter (Signed)
Submitted more clinical notes, case is still pending.

## 2017-11-26 ENCOUNTER — Telehealth (INDEPENDENT_AMBULATORY_CARE_PROVIDER_SITE_OTHER): Payer: Self-pay | Admitting: *Deleted

## 2017-11-26 NOTE — Telephone Encounter (Signed)
Called pt for an office visit left vm #1.

## 2017-11-27 ENCOUNTER — Telehealth (INDEPENDENT_AMBULATORY_CARE_PROVIDER_SITE_OTHER): Payer: Self-pay | Admitting: *Deleted

## 2017-11-27 NOTE — Telephone Encounter (Signed)
Called pt and left vm  

## 2017-12-02 ENCOUNTER — Other Ambulatory Visit: Payer: Self-pay

## 2017-12-02 DIAGNOSIS — I10 Essential (primary) hypertension: Secondary | ICD-10-CM

## 2017-12-02 MED ORDER — LISINOPRIL 10 MG PO TABS: 10.0000 mg | ORAL_TABLET | Freq: Every day | ORAL | 0 refills | Status: DC

## 2017-12-05 DIAGNOSIS — R51 Headache: Secondary | ICD-10-CM | POA: Diagnosis not present

## 2017-12-05 DIAGNOSIS — M542 Cervicalgia: Secondary | ICD-10-CM | POA: Diagnosis not present

## 2017-12-16 ENCOUNTER — Telehealth (INDEPENDENT_AMBULATORY_CARE_PROVIDER_SITE_OTHER): Payer: Self-pay | Admitting: *Deleted

## 2017-12-16 NOTE — Telephone Encounter (Signed)
Called pt to schedule for an OV, left vm

## 2017-12-17 ENCOUNTER — Other Ambulatory Visit: Payer: Self-pay | Admitting: Internal Medicine

## 2017-12-23 DIAGNOSIS — M4802 Spinal stenosis, cervical region: Secondary | ICD-10-CM | POA: Diagnosis not present

## 2017-12-23 DIAGNOSIS — R51 Headache: Secondary | ICD-10-CM | POA: Diagnosis not present

## 2017-12-23 DIAGNOSIS — Z6830 Body mass index (BMI) 30.0-30.9, adult: Secondary | ICD-10-CM | POA: Diagnosis not present

## 2018-01-02 ENCOUNTER — Other Ambulatory Visit: Payer: Self-pay | Admitting: Neurosurgery

## 2018-01-10 NOTE — Pre-Procedure Instructions (Signed)
Sean KneeCharles W Horn  01/10/2018      [facility] - [place], [place] - [address] [place] [address] Phone: [phone] Fax: [phone]    Your procedure is scheduled on June 13.  [facility]  Call this number if you have problems the morning of surgery:  [phone]   Remember:  No food or drink after midnight.     Take these medicines the morning of surgery with A SIP OF WATER  Carvedilol (Coreg) Escitalopram (Lexapro) Lansoprazole (Prevacid) Loratadine (Claritin)  Stop taking aspirin, BC's, Goody's, Herbal medications, Fish Oil, Aleve, Ibuprofen, Advil, Motrin     How to Manage Your Diabetes Before and After Surgery  Why is it important to control my blood sugar before and after surgery? . Improving blood sugar levels before and after surgery helps healing and can limit problems. . A way of improving blood sugar control is eating a healthy diet by: o  Eating less sugar and carbohydrates o  Increasing activity/exercise o  Talking with your doctor about reaching your blood sugar goals . High blood sugars (greater than 180 mg/dL) can raise your risk of infections and slow your recovery, so you will need to focus on controlling your diabetes during the weeks before surgery. . Make sure that the doctor who takes care of your diabetes knows about your planned surgery including the date and location.  How do I manage my blood sugar before surgery? . Check your blood sugar at least 4 times a day, starting 2 days before surgery, to make sure that the level is not too high or low. o Check your blood sugar the morning of your surgery when you wake up and every 2 hours until you get to the Short Stay unit. . If your blood sugar is less than 70 mg/dL, you will need to treat for low blood sugar: o Do not take insulin. o Treat a low blood sugar (less than 70 mg/dL) with  cup of clear juice  (cranberry or apple), 4 glucose tablets, OR glucose gel. Recheck blood sugar in 15 minutes after treatment (to make sure it is greater than 70 mg/dL). If your blood sugar is not greater than 70 mg/dL on recheck, call [phone] o  for further instructions. . Report your blood sugar to the short stay nurse when you get to Short Stay.  . If you are admitted to the hospital after surgery: o Your blood sugar will be checked by the staff and you will probably be given insulin after surgery (instead of oral diabetes medicines) to make sure you have good blood sugar levels. o The goal for blood sugar control after surgery is 80-180 mg/dL.              WHAT DO I DO ABOUT MY DIABETES MEDICATION?   Marland Kitchen. Do not take oral diabetes medicines (pills) the morning of surgery.  . THE NIGHT BEFORE SURGERY, take 30 units of Levemir insulin.       . THE MORNING OF SURGERY, take _____________ units of __________insulin.  . The day of surgery, do not take other diabetes injectables, including Byetta (exenatide), Bydureon (exenatide ER), Victoza (liraglutide), or Trulicity (dulaglutide).  . If your CBG is greater than 220 mg/dL, you may take  of your sliding scale (correction) dose of insulin.  Other Instructions:          Patient Signature:  Date:  Nurse Signature:  Date:   Reviewed and Endorsed by [facility] Patient Education Committee, August 2015   Do not wear jewelry, make-up or nail polish.  Do not wear lotions, powders, or perfumes, or deodorant.  Do not shave 48 hours prior to surgery.  Men may shave face and neck.  Do not bring valuables to the hospital.  [facility] is not responsible for any belongings or valuables.  Contacts, dentures or bridgework may not be worn into surgery.  Leave your suitcase in the car.  After surgery it may be brought to your room.  For patients admitted to the hospital, discharge time will be determined by your treatment team.  Patients  discharged the day of surgery will not be allowed to drive home.   Special instructions:   [place]- Preparing For Surgery  Before surgery, you can play an important role. Because skin is not sterile, your skin needs to be as free of germs as possible. You can reduce the number of germs on your skin by washing with CHG (chlorahexidine gluconate) Soap before surgery.  CHG is an antiseptic cleaner which kills germs and bonds with the skin to continue killing germs even after washing.    Oral Hygiene is also important to reduce your risk of infection.  Remember - BRUSH YOUR TEETH THE MORNING OF SURGERY WITH YOUR REGULAR TOOTHPASTE  Please do not use if you have an allergy to CHG or antibacterial soaps. If your skin becomes reddened/irritated stop using the CHG.  Do not shave (including legs and underarms) for at least 48 hours prior to first CHG shower. It is OK to shave your face.  Please follow these instructions carefully.   1. Shower the NIGHT BEFORE SURGERY and the MORNING OF SURGERY with CHG.   2. If you chose to wash your hair, wash your hair first as usual with your normal shampoo.  3. After you shampoo, rinse your hair and body thoroughly to remove the shampoo.  4. Use CHG as you would any other liquid soap. You can apply CHG directly to the skin and wash gently with a scrungie or a clean washcloth.   5. Apply the CHG Soap to your body ONLY FROM THE NECK DOWN.  Do not use on open wounds or open sores. Avoid contact with your eyes, ears, mouth and genitals (private parts). Wash Face and genitals (private parts)  with your normal soap.  6. Wash thoroughly, paying special attention to the area where your surgery will be performed.  7. Thoroughly rinse your body with warm water from the neck down.  8. DO NOT shower/wash with your normal soap after using and rinsing off the CHG Soap.  9. Pat yourself dry with a CLEAN TOWEL.  10. Wear CLEAN PAJAMAS to bed the night before surgery,  wear comfortable clothes the morning of surgery  11. Place CLEAN SHEETS on your bed the night of your first shower and DO NOT SLEEP WITH PETS.    Day of Surgery:  Do not apply any deodorants/lotions.  Please wear clean clothes to the hospital/surgery center.   Remember to brush your teeth WITH YOUR REGULAR TOOTHPASTE.    Please read over the following fact sheets that you were given. Pain Booklet, Coughing and Deep Breathing, MRSA Information and Surgical Site Infection Prevention

## 2018-01-13 ENCOUNTER — Other Ambulatory Visit: Payer: Self-pay

## 2018-01-13 ENCOUNTER — Encounter (HOSPITAL_COMMUNITY)
Admission: RE | Admit: 2018-01-13 | Discharge: 2018-01-13 | Disposition: A | Discharge status: Home / Self Care | Payer: Medicare HMO | Source: Ambulatory Visit | Attending: Neurosurgery | Admitting: Neurosurgery

## 2018-01-13 ENCOUNTER — Encounter (HOSPITAL_COMMUNITY): Payer: Self-pay

## 2018-01-13 DIAGNOSIS — Z01812 Encounter for preprocedural laboratory examination: Secondary | ICD-10-CM

## 2018-01-13 DIAGNOSIS — E119 Type 2 diabetes mellitus without complications: Secondary | ICD-10-CM

## 2018-01-13 DIAGNOSIS — Z79899 Other long term (current) drug therapy: Secondary | ICD-10-CM

## 2018-01-13 DIAGNOSIS — K219 Gastro-esophageal reflux disease without esophagitis: Secondary | ICD-10-CM

## 2018-01-13 DIAGNOSIS — R000 Tachycardia, unspecified: Secondary | ICD-10-CM | POA: Insufficient documentation

## 2018-01-13 DIAGNOSIS — Z96651 Presence of right artificial knee joint: Secondary | ICD-10-CM | POA: Insufficient documentation

## 2018-01-13 DIAGNOSIS — M4802 Spinal stenosis, cervical region: Secondary | ICD-10-CM | POA: Diagnosis not present

## 2018-01-13 DIAGNOSIS — Z8619 Personal history of other infectious and parasitic diseases: Secondary | ICD-10-CM | POA: Diagnosis not present

## 2018-01-13 DIAGNOSIS — Z794 Long term (current) use of insulin: Secondary | ICD-10-CM

## 2018-01-13 DIAGNOSIS — M1711 Unilateral primary osteoarthritis, right knee: Secondary | ICD-10-CM | POA: Diagnosis not present

## 2018-01-13 DIAGNOSIS — G4700 Insomnia, unspecified: Secondary | ICD-10-CM | POA: Insufficient documentation

## 2018-01-13 DIAGNOSIS — Z9049 Acquired absence of other specified parts of digestive tract: Secondary | ICD-10-CM | POA: Insufficient documentation

## 2018-01-13 DIAGNOSIS — Z87442 Personal history of urinary calculi: Secondary | ICD-10-CM | POA: Insufficient documentation

## 2018-01-13 DIAGNOSIS — Z0183 Encounter for blood typing: Secondary | ICD-10-CM | POA: Insufficient documentation

## 2018-01-13 DIAGNOSIS — Z01818 Encounter for other preprocedural examination: Secondary | ICD-10-CM

## 2018-01-13 DIAGNOSIS — I10 Essential (primary) hypertension: Secondary | ICD-10-CM | POA: Insufficient documentation

## 2018-01-13 DIAGNOSIS — R69 Illness, unspecified: Secondary | ICD-10-CM | POA: Diagnosis not present

## 2018-01-13 DIAGNOSIS — G9520 Unspecified cord compression: Secondary | ICD-10-CM | POA: Diagnosis not present

## 2018-01-13 DIAGNOSIS — E785 Hyperlipidemia, unspecified: Secondary | ICD-10-CM | POA: Diagnosis not present

## 2018-01-13 LAB — TYPE AND SCREEN
ABO/RH(D): O POS
Antibody Screen: NEGATIVE

## 2018-01-13 LAB — CBC
HCT: 45.7 % (ref 39.0–52.0)
Hemoglobin: 16.6 g/dL (ref 13.0–17.0)
MCH: 31.7 pg (ref 26.0–34.0)
MCHC: 36.3 g/dL — AB (ref 30.0–36.0)
MCV: 87.4 fL (ref 78.0–100.0)
PLATELETS: 243 10*3/uL (ref 150–400)
RBC: 5.23 MIL/uL (ref 4.22–5.81)
RDW: 11.8 % (ref 11.5–15.5)
WBC: 6.1 10*3/uL (ref 4.0–10.5)

## 2018-01-13 LAB — HEMOGLOBIN A1C
HEMOGLOBIN A1C: 8.9 % — AB (ref 4.8–5.6)
Mean Plasma Glucose: 208.73 mg/dL

## 2018-01-13 LAB — SURGICAL PCR SCREEN
MRSA, PCR: NEGATIVE
Staphylococcus aureus: NEGATIVE

## 2018-01-13 LAB — BASIC METABOLIC PANEL
Anion gap: 7 (ref 5–15)
BUN: 8 mg/dL (ref 6–20)
CALCIUM: 8.9 mg/dL (ref 8.9–10.3)
CO2: 25 mmol/L (ref 22–32)
CREATININE: 0.8 mg/dL (ref 0.61–1.24)
Chloride: 103 mmol/L (ref 101–111)
GFR calc Af Amer: >60 mL/min (ref >60)
Glucose, Bld: 250 mg/dL — ABNORMAL HIGH (ref 65–99)
Potassium: 3.8 mmol/L (ref 3.5–5.1)
Sodium: 135 mmol/L (ref 135–145)

## 2018-01-13 NOTE — Progress Notes (Addendum)
PCP is Dr. S Blyth, he did go for OV 09/2017, but it wasn't with her. Endo is Dr. C Gherge  LOV 07/2017 He had knee surgery 2 plus yrs ago.  At that time was noted to have tachycardia.  Sent to se Dr. Nishan at [facility].  Had to wear holter monitor.  No definite dx, but was placed on meds to control it.  No further episodes, did have echo 2014 Denies murmur, cp, sob. He checks his sugars every day.  Ranges between 130-160.  His last A1C was 13.7 05/2017 

## 2018-01-13 NOTE — Pre-Procedure Instructions (Signed)
Sean Horn  01/13/2018      [facility] - [place], [place] - [address] [place] [address] Phone: [phone] Fax: [phone]    Your procedure is scheduled on Thursday,  June 13.   [facility].M.             (posted surgery time 12:45p - 3:48p)   Call this number if you have problems the morning of surgery:  [phone]   Remember:  No food or drink after midnight, with exception of below :     Take these medicines the morning of surgery with A SIP OF WATER  Carvedilol (Coreg) Escitalopram (Lexapro) Lansoprazole (Prevacid) Loratadine (Claritin)  Stop taking aspirin, BC's, Goody's, Herbal medications, Fish Oil, Aleve, Ibuprofen, Advil, Motrin              DO NOT wear any jewelry - no rings or watches.             DO NOT wear any lotions, colognes or deodorant.             Men may shave face and neck.             DO NOT bring valuables to the hospital, as [facility] is NOT responsible for any belongings or valuables.              Contacts, dentures or bridgework may not be worn into surgery.    How to Manage Your Diabetes Before and After Surgery  Why is it important to control my blood sugar before and after surgery? . Improving blood sugar levels before and after surgery helps healing and can limit problems. . A way of improving blood sugar control is eating a healthy diet by: o  Eating less sugar and carbohydrates o  Increasing activity/exercise o  Talking with your doctor about reaching your blood sugar goals . High blood sugars (greater than 180 mg/dL) can raise your risk of infections and slow your recovery, so you will need to focus on controlling your diabetes during the weeks before surgery. . Make sure that the doctor who takes care of your diabetes knows about your planned surgery including the date and location.  How do I manage my blood sugar before surgery? . Check  your blood sugar at least 4 times a day, starting 2 days before surgery, to make sure that the level is not too high or low. o Check your blood sugar the morning of your surgery when you wake up and every 2 hours until you get to the Short Stay unit. . If your blood sugar is less than 70 mg/dL, you will need to treat for low blood sugar: o Do not take insulin. o  o Treat a low blood sugar (less than 70 mg/dL) with  cup of clear juice (cranberry or apple), 4 glucose tablets, OR glucose gel. o  Recheck blood sugar in 15 minutes after treatment (to make sure it is greater than 70 mg/dL). If your blood sugar is not greater than 70 mg/dL on recheck, call [phone] o  for further instructions. . Report your blood sugar to the short stay nurse when you get to Short Stay.  . If you are admitted to the hospital after surgery: o Your blood sugar will be checked by the staff and you will probably be given insulin after surgery (instead of oral diabetes medicines) to make sure you  have good blood sugar levels. o The goal for blood sugar control after surgery is 80-180 mg/dL.  WHAT DO I DO ABOUT MY DIABETES MEDICATION?   Marland Kitchen Do not take oral diabetes medicines (pills) the morning of surgery.  . THE NIGHT BEFORE SURGERY, take 30 units of Levemir insulin.            THE MORNING OF SURGERY, take _____________ units of __________insulin.  . The day of surgery, do not take other diabetes injectables, including Byetta (exenatide), Bydureon (exenatide ER), Victoza (liraglutide), or Trulicity (dulaglutide).  . If your CBG is greater than 220 mg/dL, you may take  of your sliding scale (correction) dose of insulin.  Other Instructions:          Patient Signature:  Date:   Nurse Signature:  Date:    Special instructions:   [place]- Preparing For Surgery  Before surgery, you can play an important role. Because skin is not sterile, your skin needs to be as free of germs as possible. You can  reduce the number of germs on your skin by washing with CHG (chlorahexidine gluconate) Soap before surgery.  CHG is an antiseptic cleaner which kills germs and bonds with the skin to continue killing germs even after washing.    Oral Hygiene is also important to reduce your risk of infection.  Remember - BRUSH YOUR TEETH THE MORNING OF SURGERY WITH YOUR REGULAR TOOTHPASTE  Please do not use if you have an allergy to CHG or antibacterial soaps. If your skin becomes reddened/irritated stop using the CHG.  Do not shave (including legs and underarms) for at least 48 hours prior to first CHG shower. It is OK to shave your face.  Please follow these instructions carefully.   1. Shower the NIGHT BEFORE SURGERY and the MORNING OF SURGERY with CHG.   2. If you chose to wash your hair, wash your hair first as usual with your normal shampoo.  3. After you shampoo, rinse your hair and body thoroughly to remove the shampoo.  4. Use CHG as you would any other liquid soap. You can apply CHG directly to the skin and wash gently with a scrungie or a clean washcloth.   5. Apply the CHG Soap to your body ONLY FROM THE NECK DOWN.  Do not use on open wounds or open sores. Avoid contact with your eyes, ears, mouth and genitals (private parts). Wash Face and genitals (private parts)  with your normal soap.  6. Wash thoroughly, paying special attention to the area where your surgery will be performed.  7. Thoroughly rinse your body with warm water from the neck down.  8. DO NOT shower/wash with your normal soap after using and rinsing off the CHG Soap.  9. Pat yourself dry with a CLEAN TOWEL.  10. Wear CLEAN PAJAMAS to bed the night before surgery, wear comfortable clothes the morning of surgery  11. Place CLEAN SHEETS on your bed the night of your first shower and DO NOT SLEEP WITH PETS.    Day of Surgery:  Do not apply any deodorants/lotions.  Please wear clean clothes to the hospital/surgery center.    Remember to brush your teeth WITH YOUR REGULAR TOOTHPASTE.    Please read over the following fact sheets that you were given. Pain Booklet, Coughing and Deep Breathing, MRSA Information and Surgical Site Infection Prevention

## 2018-01-13 NOTE — Progress Notes (Signed)
Anesthesia Chart Review:   Case:  [id number] Date/Time:  01/16/18 1230   Procedure:  ANTERIOR CERVICAL DECOMPRESSION/DISCECTOMY FUSION CERVICAL 4- CERVICAL 5, CERVICAL 5- CERVICAL 6 (N/A ) - ANTERIOR CERVICAL DECOMPRESSION/DISCECTOMY FUSION CERVICAL 4- CERVICAL 5, CERVICAL 5- CERVICAL 6   Anesthesia type:  General   Pre-op diagnosis:  CERVICAL STENOSIS OF SPINAL CANAL   [facility]  [facility] ROOM 20 / [facility] OR   Surgeon:  Lisbeth Renshaw, MD      DISCUSSION: - Pt is 57 year old male with hx tachycardia (controlled by carvedilol), HTN, DM   VS: BP (!) 146/92   Pulse 85   Temp 36.6 C   Resp 20   Ht 6' (1.829 m)   Wt 219 lb (99.3 kg)   SpO2 96%   BMI 29.70 kg/m    PROVIDERS: - PCP is Bradd Canary, MD  - Saw cardiologist Charlton Haws, MD in 2014 for persistent tachycardia.  Echo and holter monitor ordered, results below. Tachycardia thought to be physiologic; pt started on coreg to control sx.  - Endocrinologist is Carlus Pavlov, MD   LABS: Labs reviewed: Acceptable for surgery. (all labs ordered are listed, but only abnormal results are displayed)  Labs Reviewed  CBC - Abnormal; Notable for the following components:      Result Value   MCHC 36.3 (*)    All other components within normal limits  BASIC METABOLIC PANEL - Abnormal; Notable for the following components:   Glucose, Bld 250 (*)    All other components within normal limits  HEMOGLOBIN A1C - Abnormal; Notable for the following components:   Hgb A1c MFr Bld 8.9 (*)    All other components within normal limits  SURGICAL PCR SCREEN  TYPE AND SCREEN    EKG 01/13/18: NSR   CV:  Echo 07/24/13:  - Left ventricle: The cavity size was normal. Wall thicknesswas increased in a pattern of mild LVH. Systolic functionwas normal. The estimated ejection fraction was in therange of 60% to 65%. Wall motion was normal; there were noregional wall motion abnormalities. There was an increasedrelative contribution of atrial  contraction to ventricularfilling. Doppler parameters are consistent with abnormalleft ventricular relaxation (grade 1 diastolicdysfunction). - Aorta: Borderline aortic root enlargement. Aortic rootdimension: 39mm (ED). - Mitral valve: A torn chordae tendinae is seen. Nosignificant regurgitation is noted.  Holter monitor 07/22/13:  - Predominant rhythm was sinus tachycardia with an average heart rate of 104 bpm.  1 PVC noted. - Patient's complaints of "chest discomfort" and "SOB" correlated with sinus tachycardia, HR 106-110 bpm    Past Medical History:  Diagnosis Date  . Anxiety   . GERD (gastroesophageal reflux disease)    occasional - OTC as needed  . History of chicken pox   . History of kidney stones   . History of shingles   . Hypertension    under control with med., had been on med. since age 57  . Insomnia 09/13/2016  . Insulin dependent diabetes mellitus (HCC)    Follows with endocrinology, Dr Chestine Spore reports his last hgba1c was 7.3   . Non-insulin dependent type 2 diabetes mellitus (HCC)   . Obesity 01/30/2017  . Osteoarthritis    right knee  . PONV (postoperative nausea and vomiting)   . PONV (postoperative nausea and vomiting)   . Preventative health care 08/14/2013  . Tachycardia   . Urinary frequency 12/11/2016    Past Surgical History:  Procedure Laterality Date  . CHOLECYSTECTOMY  1980's  . CYSTOSCOPY/RETROGRADE/URETEROSCOPY/STONE  EXTRACTION WITH BASKET Right 08/27/2003   and double J stent placement  . INGUINAL HERNIA REPAIR Left 1990's  . KNEE ARTHROSCOPY Left 1980's  . KNEE ARTHROSCOPY Right 2013  . KNEE ARTHROSCOPY Right 06/30/2013   Procedure: RIGHT KNEE ARTHROSCOPY AND SYNOVECTOMY;  Surgeon: Velna OchsPeter G Dalldorf, MD;  Location: [facility];  Service: Orthopedics;  Laterality: Right;  . LITHOTRIPSY     multiple, b/l  . SHOULDER ADHESION RELEASE Left ` 1998  . SHOULDER ARTHROSCOPY W/ ROTATOR CUFF REPAIR Left 10/20/2004   x 4, had to have part of  clavile removed. torn rotator cuff multiple times, had to place screws   . SHOULDER ARTHROSCOPY W/ SUPERIOR LABRAL ANTERIOR POSTERIOR LESION REPAIR Left 12/22/2004  . TOTAL KNEE ARTHROPLASTY Right 12/23/2012   x3, 2 arthroscopies and a TKR,  TOTAL KNEE ARTHROPLASTY;  Surgeon: Velna OchsPeter G Dalldorf, MD;  Location: [facility];  Service: Orthopedics;  Laterality: Right;  DEPUY, RNFA  . WRIST SURGERY Right    x 3, torn ligaments, pins placed then infected, then fused with plate    MEDICATIONS: . carvedilol (COREG) 12.5 MG tablet  . escitalopram (LEXAPRO) 20 MG tablet  . insulin aspart (NOVOLOG FLEXPEN) 100 UNIT/ML FlexPen  . insulin detemir (LEVEMIR) 100 UNIT/ML injection  . lansoprazole (PREVACID) 30 MG capsule  . lisinopril (PRINIVIL,ZESTRIL) 10 MG tablet  . loratadine (CLARITIN) 10 MG tablet  . TRULICITY 1.5 MG/0.5ML SOPN   No current facility-administered medications for this encounter.     If no changes, I anticipate pt can proceed with surgery as scheduled.   Rica Mastngela Brentley Landfair, FNP-BC [facility]Anesthesiology Phone: [phone] 01/13/2018 3:37 PM

## 2018-01-13 NOTE — Progress Notes (Signed)
   01/13/18 0814  OBSTRUCTIVE SLEEP APNEA  Have you ever been diagnosed with sleep apnea through a sleep study? No  Do you snore loudly (loud enough to be heard through closed doors)?  0  Do you often feel tired, fatigued, or sleepy during the daytime (such as falling asleep during driving or talking to someone)? 0  Has anyone observed you stop breathing during your sleep? 0  Do you have, or are you being treated for high blood pressure? 1  BMI more than 35 kg/m2? 0  Age > 50 (1-yes) 1  Neck circumference greater than:Male 16 inches or larger, Male 17inches or larger? 1  Male Gender (Yes=1) 1  Obstructive Sleep Apnea Score 4  Score 5 or greater  Results sent to PCP

## 2018-01-13 NOTE — Progress Notes (Signed)
PCP is Dr. Mariel AloeS Blyth, he did go for OV 09/2017, but it wasn't with her. Endo is Dr. Nehemiah Settle Gherge  LOV 07/2017 He had knee surgery 2 plus yrs ago.  At that time was noted to have tachycardia.  [facility] to se [facility].  Had to wear holter monitor.  No definite dx, but was placed on meds to control it.  No further episodes, did have echo 2014 Denies murmur, cp, sob. He checks his sugars every day.  Ranges between 130-160.  His last A1C was 13.7 05/2017

## 2018-01-15 ENCOUNTER — Other Ambulatory Visit: Payer: Self-pay | Admitting: Neurosurgery

## 2018-01-16 ENCOUNTER — Encounter (HOSPITAL_COMMUNITY): Payer: Self-pay | Admitting: Anesthesiology

## 2018-01-16 ENCOUNTER — Encounter (HOSPITAL_COMMUNITY)
Admission: AD | Disposition: A | Discharge status: Home / Self Care | Payer: Self-pay | Source: Home / Self Care | Attending: Neurosurgery

## 2018-01-16 ENCOUNTER — Ambulatory Visit (HOSPITAL_COMMUNITY): Payer: Medicare HMO | Admitting: Emergency Medicine

## 2018-01-16 ENCOUNTER — Inpatient Hospital Stay (HOSPITAL_COMMUNITY)
Admission: AD | Admit: 2018-01-16 | Discharge: 2018-01-16 | DRG: 472 | Disposition: A | Discharge status: Home / Self Care | Payer: Medicare HMO | Attending: Neurosurgery | Admitting: Neurosurgery

## 2018-01-16 ENCOUNTER — Other Ambulatory Visit: Payer: Self-pay

## 2018-01-16 ENCOUNTER — Ambulatory Visit (HOSPITAL_COMMUNITY): Payer: Medicare HMO

## 2018-01-16 ENCOUNTER — Ambulatory Visit (HOSPITAL_COMMUNITY): Payer: Medicare HMO | Admitting: Certified Registered Nurse Anesthetist

## 2018-01-16 DIAGNOSIS — Z8249 Family history of ischemic heart disease and other diseases of the circulatory system: Secondary | ICD-10-CM

## 2018-01-16 DIAGNOSIS — E785 Hyperlipidemia, unspecified: Secondary | ICD-10-CM | POA: Diagnosis not present

## 2018-01-16 DIAGNOSIS — M4322 Fusion of spine, cervical region: Secondary | ICD-10-CM | POA: Diagnosis not present

## 2018-01-16 DIAGNOSIS — Z794 Long term (current) use of insulin: Secondary | ICD-10-CM | POA: Diagnosis not present

## 2018-01-16 DIAGNOSIS — M4802 Spinal stenosis, cervical region: Principal | ICD-10-CM | POA: Diagnosis present

## 2018-01-16 DIAGNOSIS — M542 Cervicalgia: Secondary | ICD-10-CM | POA: Diagnosis present

## 2018-01-16 DIAGNOSIS — I10 Essential (primary) hypertension: Secondary | ICD-10-CM | POA: Diagnosis not present

## 2018-01-16 DIAGNOSIS — Z8619 Personal history of other infectious and parasitic diseases: Secondary | ICD-10-CM

## 2018-01-16 DIAGNOSIS — K219 Gastro-esophageal reflux disease without esophagitis: Secondary | ICD-10-CM | POA: Diagnosis not present

## 2018-01-16 DIAGNOSIS — Z9049 Acquired absence of other specified parts of digestive tract: Secondary | ICD-10-CM

## 2018-01-16 DIAGNOSIS — Z419 Encounter for procedure for purposes other than remedying health state, unspecified: Secondary | ICD-10-CM

## 2018-01-16 DIAGNOSIS — Z79899 Other long term (current) drug therapy: Secondary | ICD-10-CM | POA: Diagnosis not present

## 2018-01-16 DIAGNOSIS — M1711 Unilateral primary osteoarthritis, right knee: Secondary | ICD-10-CM | POA: Diagnosis present

## 2018-01-16 DIAGNOSIS — G9520 Unspecified cord compression: Secondary | ICD-10-CM | POA: Diagnosis not present

## 2018-01-16 DIAGNOSIS — E119 Type 2 diabetes mellitus without complications: Secondary | ICD-10-CM | POA: Diagnosis not present

## 2018-01-16 DIAGNOSIS — Z833 Family history of diabetes mellitus: Secondary | ICD-10-CM | POA: Diagnosis not present

## 2018-01-16 DIAGNOSIS — Z87442 Personal history of urinary calculi: Secondary | ICD-10-CM | POA: Diagnosis not present

## 2018-01-16 DIAGNOSIS — R69 Illness, unspecified: Secondary | ICD-10-CM | POA: Diagnosis not present

## 2018-01-16 DIAGNOSIS — F17290 Nicotine dependence, other tobacco product, uncomplicated: Secondary | ICD-10-CM | POA: Diagnosis present

## 2018-01-16 DIAGNOSIS — Z886 Allergy status to analgesic agent status: Secondary | ICD-10-CM | POA: Diagnosis not present

## 2018-01-16 HISTORY — PX: ANTERIOR CERVICAL DECOMP/DISCECTOMY FUSION: SHX1161

## 2018-01-16 LAB — GLUCOSE, CAPILLARY
Glucose-Capillary: 280 mg/dL — ABNORMAL HIGH (ref 65–99)
Glucose-Capillary: 266 mg/dL — ABNORMAL HIGH (ref 65–99)
Glucose-Capillary: 269 mg/dL — ABNORMAL HIGH (ref 65–99)

## 2018-01-16 SURGERY — ANTERIOR CERVICAL DECOMPRESSION/DISCECTOMY FUSION 2 LEVELS
Anesthesia: General

## 2018-01-16 MED ORDER — LIDOCAINE-EPINEPHRINE 1 %-1:100000 IJ SOLN
INTRAMUSCULAR | Status: DC | PRN
  Administered 2018-01-16: 4 mL via INTRADERMAL

## 2018-01-16 MED ORDER — LIDOCAINE-EPINEPHRINE 1 %-1:100000 IJ SOLN
INTRAMUSCULAR | Status: AC
  Filled 2018-01-16: qty 1

## 2018-01-16 MED ORDER — PHENOL 1.4 % MT LIQD: 1.0000 | OROMUCOSAL | Status: DC | PRN

## 2018-01-16 MED ORDER — MENTHOL 3 MG MT LOZG: 1.0000 | LOZENGE | OROMUCOSAL | Status: DC | PRN

## 2018-01-16 MED ORDER — SODIUM CHLORIDE 0.9% FLUSH: 3.0000 mL | INTRAVENOUS | Status: DC | PRN

## 2018-01-16 MED ORDER — ONDANSETRON HCL 4 MG PO TABS: 4.0000 mg | ORAL_TABLET | Freq: Four times a day (QID) | ORAL | Status: DC | PRN

## 2018-01-16 MED ORDER — METOPROLOL TARTRATE 5 MG/5ML IV SOLN: 2.5000 mg | Freq: Once | INTRAVENOUS | Status: AC

## 2018-01-16 MED ORDER — ONDANSETRON HCL 4 MG/2ML IJ SOLN: 4.0000 mg | Freq: Four times a day (QID) | INTRAMUSCULAR | Status: DC | PRN

## 2018-01-16 MED ORDER — OXYCODONE-ACETAMINOPHEN 7.5-325 MG PO TABS: 1.0000 | ORAL_TABLET | ORAL | 0 refills | Status: DC | PRN

## 2018-01-16 MED ORDER — CHLORHEXIDINE GLUCONATE CLOTH 2 % EX PADS: 6.0000 | MEDICATED_PAD | Freq: Once | CUTANEOUS | Status: DC

## 2018-01-16 MED ORDER — SENNOSIDES-DOCUSATE SODIUM 8.6-50 MG PO TABS: 1.0000 | ORAL_TABLET | Freq: Every evening | ORAL | Status: DC | PRN

## 2018-01-16 MED ORDER — HYDROMORPHONE HCL 2 MG/ML IJ SOLN
0.2500 mg | INTRAMUSCULAR | Status: DC | PRN
  Administered 2018-01-16 (×2): 0.5 mg via INTRAVENOUS

## 2018-01-16 MED ORDER — DEXAMETHASONE SODIUM PHOSPHATE 10 MG/ML IJ SOLN
INTRAMUSCULAR | Status: DC | PRN
  Administered 2018-01-16: 4 mg via INTRAVENOUS

## 2018-01-16 MED ORDER — BISACODYL 10 MG RE SUPP: 10.0000 mg | Freq: Every day | RECTAL | Status: DC | PRN

## 2018-01-16 MED ORDER — PHENYLEPHRINE 40 MCG/ML (10ML) SYRINGE FOR IV PUSH (FOR BLOOD PRESSURE SUPPORT)
PREFILLED_SYRINGE | INTRAVENOUS | Status: DC | PRN
  Administered 2018-01-16 (×6): 80 ug via INTRAVENOUS

## 2018-01-16 MED ORDER — DOCUSATE SODIUM 100 MG PO CAPS: 100.0000 mg | ORAL_CAPSULE | Freq: Two times a day (BID) | ORAL | Status: DC

## 2018-01-16 MED ORDER — HYDRALAZINE HCL 20 MG/ML IJ SOLN
INTRAMUSCULAR | Status: AC
  Filled 2018-01-16: qty 1

## 2018-01-16 MED ORDER — FENTANYL CITRATE (PF) 250 MCG/5ML IJ SOLN
INTRAMUSCULAR | Status: DC | PRN
  Administered 2018-01-16: 150 ug via INTRAVENOUS
  Administered 2018-01-16 (×2): 50 ug via INTRAVENOUS

## 2018-01-16 MED ORDER — BUPIVACAINE HCL (PF) 0.5 % IJ SOLN
INTRAMUSCULAR | Status: AC
  Filled 2018-01-16: qty 30

## 2018-01-16 MED ORDER — CEFAZOLIN SODIUM-DEXTROSE 2-4 GM/100ML-% IV SOLN
2.0000 g | INTRAVENOUS | Status: AC
  Administered 2018-01-16: 2 g via INTRAVENOUS
  Filled 2018-01-16: qty 100

## 2018-01-16 MED ORDER — MIDAZOLAM HCL 2 MG/2ML IJ SOLN
INTRAMUSCULAR | Status: AC
  Filled 2018-01-16: qty 2

## 2018-01-16 MED ORDER — 0.9 % SODIUM CHLORIDE (POUR BTL) OPTIME
TOPICAL | Status: DC | PRN
  Administered 2018-01-16: 1000 mL

## 2018-01-16 MED ORDER — LABETALOL HCL 5 MG/ML IV SOLN
INTRAVENOUS | Status: AC
  Administered 2018-01-16: 10 mg
  Filled 2018-01-16: qty 4

## 2018-01-16 MED ORDER — HYDROMORPHONE HCL 1 MG/ML IJ SOLN: 0.5000 mg | INTRAMUSCULAR | Status: DC | PRN

## 2018-01-16 MED ORDER — HYDROCODONE-ACETAMINOPHEN 5-325 MG PO TABS: 1.0000 | ORAL_TABLET | ORAL | Status: DC | PRN

## 2018-01-16 MED ORDER — METOPROLOL TARTRATE 5 MG/5ML IV SOLN
2.5000 mg | Freq: Once | INTRAVENOUS | Status: AC
  Administered 2018-01-16: 2.5 mg via INTRAVENOUS

## 2018-01-16 MED ORDER — CEFAZOLIN SODIUM-DEXTROSE 2-4 GM/100ML-% IV SOLN: 2.0000 g | Freq: Three times a day (TID) | INTRAVENOUS | Status: DC

## 2018-01-16 MED ORDER — FLEET ENEMA 7-19 GM/118ML RE ENEM: 1.0000 | ENEMA | Freq: Once | RECTAL | Status: DC | PRN

## 2018-01-16 MED ORDER — INSULIN ASPART 100 UNIT/ML ~~LOC~~ SOLN: 0.0000 [IU] | Freq: Three times a day (TID) | SUBCUTANEOUS | Status: DC

## 2018-01-16 MED ORDER — DEXTROSE 5 % IV SOLN
500.0000 mg | Freq: Four times a day (QID) | INTRAVENOUS | Status: DC | PRN
  Filled 2018-01-16: qty 5

## 2018-01-16 MED ORDER — DEXTROSE 5 % IV SOLN
INTRAVENOUS | Status: DC | PRN
  Administered 2018-01-16: 20 ug/min via INTRAVENOUS

## 2018-01-16 MED ORDER — METHOCARBAMOL 500 MG PO TABS
500.0000 mg | ORAL_TABLET | Freq: Four times a day (QID) | ORAL | Status: DC | PRN
  Administered 2018-01-16: 500 mg via ORAL
  Filled 2018-01-16: qty 1

## 2018-01-16 MED ORDER — OXYCODONE HCL 5 MG PO TABS
5.0000 mg | ORAL_TABLET | ORAL | Status: DC | PRN
  Administered 2018-01-16: 10 mg via ORAL
  Filled 2018-01-16: qty 2

## 2018-01-16 MED ORDER — HYDRALAZINE HCL 20 MG/ML IJ SOLN
5.0000 mg | Freq: Once | INTRAMUSCULAR | Status: AC
  Administered 2018-01-16: 5 mg via INTRAVENOUS

## 2018-01-16 MED ORDER — BUPIVACAINE HCL 0.5 % IJ SOLN
INTRAMUSCULAR | Status: DC | PRN
  Administered 2018-01-16: 4 mL

## 2018-01-16 MED ORDER — LACTATED RINGERS IV SOLN
INTRAVENOUS | Status: DC
  Administered 2018-01-16 (×2): via INTRAVENOUS

## 2018-01-16 MED ORDER — THROMBIN 5000 UNITS EX SOLR
OROMUCOSAL | Status: DC | PRN
  Administered 2018-01-16: 14:00:00 via TOPICAL

## 2018-01-16 MED ORDER — ONDANSETRON HCL 4 MG/2ML IJ SOLN
INTRAMUSCULAR | Status: DC | PRN
  Administered 2018-01-16: 4 mg via INTRAVENOUS

## 2018-01-16 MED ORDER — METOPROLOL TARTRATE 5 MG/5ML IV SOLN
INTRAVENOUS | Status: AC
  Administered 2018-01-16: 2.5 mg
  Filled 2018-01-16: qty 5

## 2018-01-16 MED ORDER — SUCCINYLCHOLINE CHLORIDE 200 MG/10ML IV SOSY
PREFILLED_SYRINGE | INTRAVENOUS | Status: DC | PRN
  Administered 2018-01-16: 120 mg via INTRAVENOUS

## 2018-01-16 MED ORDER — ACETAMINOPHEN 650 MG RE SUPP: 650.0000 mg | RECTAL | Status: DC | PRN

## 2018-01-16 MED ORDER — ACETAMINOPHEN 325 MG PO TABS: 650.0000 mg | ORAL_TABLET | ORAL | Status: DC | PRN

## 2018-01-16 MED ORDER — GABAPENTIN 300 MG PO CAPS: 300.0000 mg | ORAL_CAPSULE | Freq: Three times a day (TID) | ORAL | Status: DC

## 2018-01-16 MED ORDER — THROMBIN 5000 UNITS EX SOLR
CUTANEOUS | Status: AC
  Filled 2018-01-16: qty 5000

## 2018-01-16 MED ORDER — INSULIN ASPART 100 UNIT/ML ~~LOC~~ SOLN: 0.0000 [IU] | Freq: Every day | SUBCUTANEOUS | Status: DC

## 2018-01-16 MED ORDER — BACITRACIN 50000 UNITS IM SOLR
INTRAMUSCULAR | Status: DC | PRN
  Administered 2018-01-16: 14:00:00

## 2018-01-16 MED ORDER — SODIUM CHLORIDE 0.9 % IV SOLN: INTRAVENOUS | Status: DC

## 2018-01-16 MED ORDER — HYDRALAZINE HCL 20 MG/ML IJ SOLN
10.0000 mg | Freq: Once | INTRAMUSCULAR | Status: AC
  Administered 2018-01-16: 10 mg via INTRAVENOUS

## 2018-01-16 MED ORDER — PROPOFOL 10 MG/ML IV BOLUS
INTRAVENOUS | Status: DC | PRN
  Administered 2018-01-16: 50 mg via INTRAVENOUS
  Administered 2018-01-16: 150 mg via INTRAVENOUS

## 2018-01-16 MED ORDER — LIDOCAINE 2% (20 MG/ML) 5 ML SYRINGE
INTRAMUSCULAR | Status: DC | PRN
  Administered 2018-01-16: 100 mg via INTRAVENOUS

## 2018-01-16 MED ORDER — SODIUM CHLORIDE 0.9% FLUSH: 3.0000 mL | Freq: Two times a day (BID) | INTRAVENOUS | Status: DC

## 2018-01-16 MED ORDER — SODIUM CHLORIDE 0.9 % IV SOLN: 250.0000 mL | INTRAVENOUS | Status: DC

## 2018-01-16 MED ORDER — ACETAMINOPHEN 500 MG PO TABS
1000.0000 mg | ORAL_TABLET | Freq: Four times a day (QID) | ORAL | Status: DC
  Administered 2018-01-16: 1000 mg via ORAL
  Filled 2018-01-16: qty 2

## 2018-01-16 MED ORDER — LABETALOL HCL 5 MG/ML IV SOLN
10.0000 mg | Freq: Once | INTRAVENOUS | Status: AC
  Administered 2018-01-16: 10 mg via INTRAVENOUS

## 2018-01-16 MED ORDER — SENNA 8.6 MG PO TABS: 1.0000 | ORAL_TABLET | Freq: Two times a day (BID) | ORAL | Status: DC

## 2018-01-16 MED ORDER — LABETALOL HCL 5 MG/ML IV SOLN
INTRAVENOUS | Status: AC
  Filled 2018-01-16: qty 4

## 2018-01-16 MED ORDER — FENTANYL CITRATE (PF) 250 MCG/5ML IJ SOLN
INTRAMUSCULAR | Status: AC
  Filled 2018-01-16: qty 5

## 2018-01-16 MED ORDER — SUGAMMADEX SODIUM 200 MG/2ML IV SOLN
INTRAVENOUS | Status: DC | PRN
  Administered 2018-01-16: 200 mg via INTRAVENOUS

## 2018-01-16 MED ORDER — MIDAZOLAM HCL 5 MG/5ML IJ SOLN
INTRAMUSCULAR | Status: DC | PRN
  Administered 2018-01-16: 2 mg via INTRAVENOUS

## 2018-01-16 MED ORDER — PROPOFOL 10 MG/ML IV BOLUS
INTRAVENOUS | Status: AC
  Filled 2018-01-16: qty 20

## 2018-01-16 MED ORDER — HYDROMORPHONE HCL 2 MG/ML IJ SOLN
INTRAMUSCULAR | Status: AC
  Filled 2018-01-16: qty 1

## 2018-01-16 MED ORDER — METHOCARBAMOL 500 MG PO TABS: 500.0000 mg | ORAL_TABLET | Freq: Four times a day (QID) | ORAL | 2 refills | Status: DC | PRN

## 2018-01-16 MED ORDER — ROCURONIUM BROMIDE 10 MG/ML (PF) SYRINGE
PREFILLED_SYRINGE | INTRAVENOUS | Status: DC | PRN
  Administered 2018-01-16: 40 mg via INTRAVENOUS
  Administered 2018-01-16 (×2): 20 mg via INTRAVENOUS

## 2018-01-16 SURGICAL SUPPLY — 69 items
ADH SKN CLS APL DERMABOND .7 (GAUZE/BANDAGES/DRESSINGS) ×1
APL SKNCLS STERI-STRIP NONHPOA (GAUZE/BANDAGES/DRESSINGS)
BAG DECANTER FOR FLEXI CONT (MISCELLANEOUS) ×2 IMPLANT
BENZOIN TINCTURE PRP APPL 2/3 (GAUZE/BANDAGES/DRESSINGS) IMPLANT
BLADE CLIPPER SURG (BLADE) ×1 IMPLANT
BLADE SURG 11 STRL SS (BLADE) ×2 IMPLANT
BLADE ULTRA TIP 2M (BLADE) IMPLANT
BNDG GAUZE ELAST 4 BULKY (GAUZE/BANDAGES/DRESSINGS) IMPLANT
BUR MATCHSTICK NEURO 3.0 LAGG (BURR) ×2 IMPLANT
CAGE PEEK TI 6X16X14 12D (Cage) ×2 IMPLANT
CANISTER SUCT 3000ML PPV (MISCELLANEOUS) ×2 IMPLANT
CARTRIDGE OIL MAESTRO DRILL (MISCELLANEOUS) ×1 IMPLANT
DECANTER SPIKE VIAL GLASS SM (MISCELLANEOUS) ×2 IMPLANT
DERMABOND ADVANCED (GAUZE/BANDAGES/DRESSINGS) ×1
DERMABOND ADVANCED .7 DNX12 (GAUZE/BANDAGES/DRESSINGS) ×1 IMPLANT
DIFFUSER DRILL AIR PNEUMATIC (MISCELLANEOUS) ×2 IMPLANT
DRAIN CHANNEL 10M FLAT 3/4 FLT (DRAIN) IMPLANT
DRAPE C-ARM 42X72 X-RAY (DRAPES) ×4 IMPLANT
DRAPE HALF SHEET 40X57 (DRAPES) ×1 IMPLANT
DRAPE LAPAROTOMY 100X72 PEDS (DRAPES) ×2 IMPLANT
DRAPE MICROSCOPE LEICA (MISCELLANEOUS) ×2 IMPLANT
DRSG OPSITE 4X5.5 SM (GAUZE/BANDAGES/DRESSINGS) ×4 IMPLANT
DRSG OPSITE POSTOP 3X4 (GAUZE/BANDAGES/DRESSINGS) ×3 IMPLANT
DURAPREP 6ML APPLICATOR 50/CS (WOUND CARE) ×2 IMPLANT
ELECT COATED BLADE 2.86 ST (ELECTRODE) ×2 IMPLANT
ELECT REM PT RETURN 9FT ADLT (ELECTROSURGICAL) ×2
ELECTRODE REM PT RTRN 9FT ADLT (ELECTROSURGICAL) ×1 IMPLANT
EVACUATOR SILICONE 100CC (DRAIN) IMPLANT
GAUZE SPONGE 4X4 16PLY XRAY LF (GAUZE/BANDAGES/DRESSINGS) IMPLANT
GLOVE BIO SURGEON STRL SZ7.5 (GLOVE) ×2 IMPLANT
GLOVE BIO SURGEON STRL SZ8 (GLOVE) ×1 IMPLANT
GLOVE BIOGEL PI IND STRL 7.5 (GLOVE) ×2 IMPLANT
GLOVE BIOGEL PI IND STRL 8.5 (GLOVE) IMPLANT
GLOVE BIOGEL PI INDICATOR 7.5 (GLOVE) ×2
GLOVE BIOGEL PI INDICATOR 8.5 (GLOVE) ×1
GLOVE ECLIPSE 7.0 STRL STRAW (GLOVE) ×3 IMPLANT
GLOVE EXAM NITRILE LRG STRL (GLOVE) IMPLANT
GLOVE EXAM NITRILE XL STR (GLOVE) IMPLANT
GLOVE EXAM NITRILE XS STR PU (GLOVE) IMPLANT
GOWN STRL REUS W/ TWL LRG LVL3 (GOWN DISPOSABLE) ×2 IMPLANT
GOWN STRL REUS W/ TWL XL LVL3 (GOWN DISPOSABLE) IMPLANT
GOWN STRL REUS W/TWL 2XL LVL3 (GOWN DISPOSABLE) IMPLANT
GOWN STRL REUS W/TWL LRG LVL3 (GOWN DISPOSABLE) ×6
GOWN STRL REUS W/TWL XL LVL3 (GOWN DISPOSABLE) ×2
HEMOSTAT POWDER KIT SURGIFOAM (HEMOSTASIS) ×2 IMPLANT
INSERT GRAFT XPANSE 9.5X8.5X6 (Bone Implant) ×2 IMPLANT
KIT BASIN OR (CUSTOM PROCEDURE TRAY) ×2 IMPLANT
KIT TURNOVER KIT B (KITS) ×2 IMPLANT
NDL SPNL 22GX3.5 QUINCKE BK (NEEDLE) ×1 IMPLANT
NEEDLE HYPO 22GX1.5 SAFETY (NEEDLE) ×2 IMPLANT
NEEDLE SPNL 22GX3.5 QUINCKE BK (NEEDLE) ×2 IMPLANT
NS IRRIG 1000ML POUR BTL (IV SOLUTION) ×2 IMPLANT
OIL CARTRIDGE MAESTRO DRILL (MISCELLANEOUS) ×2
PACK LAMINECTOMY NEURO (CUSTOM PROCEDURE TRAY) ×2 IMPLANT
PAD ARMBOARD 7.5X6 YLW CONV (MISCELLANEOUS) ×6 IMPLANT
PLATE 2 42.5XLCK NS SPNE CVD (Plate) IMPLANT
PLATE 2 ATLANTIS TRANS (Plate) ×2 IMPLANT
RUBBERBAND STERILE (MISCELLANEOUS) ×4 IMPLANT
SCREW SELF TAP VAR 4.0X13 (Screw) ×6 IMPLANT
SPONGE INTESTINAL PEANUT (DISPOSABLE) ×2 IMPLANT
SPONGE SURGIFOAM ABS GEL 100 (HEMOSTASIS) ×2 IMPLANT
STRIP CLOSURE SKIN 1/2X4 (GAUZE/BANDAGES/DRESSINGS) IMPLANT
SUT ETHILON 3 0 FSL (SUTURE) IMPLANT
SUT VIC AB 3-0 SH 8-18 (SUTURE) ×2 IMPLANT
SUT VICRYL 3-0 RB1 18 ABS (SUTURE) ×3 IMPLANT
TAPE CLOTH 3X10 TAN LF (GAUZE/BANDAGES/DRESSINGS) ×2 IMPLANT
TOWEL GREEN STERILE (TOWEL DISPOSABLE) ×2 IMPLANT
TOWEL GREEN STERILE FF (TOWEL DISPOSABLE) ×2 IMPLANT
WATER STERILE IRR 1000ML POUR (IV SOLUTION) ×2 IMPLANT

## 2018-01-16 NOTE — Anesthesia Preprocedure Evaluation (Signed)
Anesthesia Evaluation  Patient identified by MRN, date of birth, ID band Patient awake    History of Anesthesia Complications (+) PONV  Airway Mallampati: II  TM Distance: >3 FB     Dental   Pulmonary    breath sounds clear to auscultation       Cardiovascular hypertension,  Rhythm:Regular Rate:Normal     Neuro/Psych    GI/Hepatic Neg liver ROS, GERD  ,  Endo/Other  diabetes  Renal/GU negative Renal ROS     Musculoskeletal   Abdominal   Peds  Hematology   Anesthesia Other Findings   Reproductive/Obstetrics                             Anesthesia Physical Anesthesia Plan  ASA: III  Anesthesia Plan: General   Post-op Pain Management:    Induction: Intravenous  PONV Risk Score and Plan: 3 and Treatment may vary due to age or medical condition, Dexamethasone, Ondansetron and Midazolam  Airway Management Planned:   Additional Equipment:   Intra-op Plan:   Post-operative Plan: Extubation in OR  Informed Consent: I have reviewed the patients History and Physical, chart, labs and discussed the procedure including the risks, benefits and alternatives for the proposed anesthesia with the patient or authorized representative who has indicated his/her understanding and acceptance.   Dental advisory given  Plan Discussed with: CRNA and Anesthesiologist  Anesthesia Plan Comments:         Anesthesia Quick Evaluation

## 2018-01-16 NOTE — H&P (Signed)
Chief Complaint   Neck pain  HPI   HPI: Sean Horn is a 57 y.o. male who was initially evaluated for severe neck pain and posterior headaches. He underwent an MRI of his cervical spine which did demonstrate moderate to severe stenosis at C4-5 and C5-6. He subsequently underwent cervical ESI which greatly improved his pain. He does not report any significant arm weakness, leg weakness, imbalance, falls, or changes in bowel or bladder function.  He does report some mild numbness in the fingers of his hands when he 1st wakes up, but says he shakes out his hands and the numbness resolves within minutes. Based on the degree of cervical spinal stenosis, despite being asymptomatic, the patient was offered surgical decompression vs monitoring. He elected to proceed with C4-5 and C5-6 ACDF. He presents today for surgery. He is without any concerns.   Patient Active Problem List   Diagnosis Date Noted  . Hyperlipidemia 07/19/2017  . Obesity 01/30/2017  . Urinary frequency 12/11/2016  . Insomnia 09/13/2016  . History of chicken pox   . History of shingles   . Preventative health care 08/14/2013  . PONV (postoperative nausea and vomiting)   . Anxiety   . History of kidney stones   . GERD (gastroesophageal reflux disease)   . Insulin dependent diabetes mellitus (HCC)   . Hypertension   . Osteoarthritis   . 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 Anxiety   . GERD (gastroesophageal reflux disease)    occasional - OTC as needed  . History of chicken pox   . History of kidney stones   . History of shingles   . Hypertension    under control with med., had been on med. since age 12  . Insomnia 09/13/2016  . Insulin dependent diabetes mellitus (HCC)    Follows with endocrinology, Dr Chestine Spore reports his last hgba1c was 7.3   . Non-insulin dependent type 2 diabetes mellitus (HCC)   . Obesity 01/30/2017  . Osteoarthritis    right knee  .  PONV (postoperative nausea and vomiting)   . PONV (postoperative nausea and vomiting)   . Preventative health care 08/14/2013  . Tachycardia   . Urinary frequency 12/11/2016    PSH: Past Surgical History:  Procedure Laterality Date  . CHOLECYSTECTOMY  1980's  . CYSTOSCOPY/RETROGRADE/URETEROSCOPY/STONE EXTRACTION WITH BASKET Right 08/27/2003   and double J stent placement  . INGUINAL HERNIA REPAIR Left 1990's  . KNEE ARTHROSCOPY Left 1980's  . KNEE ARTHROSCOPY Right 2013  . KNEE ARTHROSCOPY Right 06/30/2013   Procedure: RIGHT KNEE ARTHROSCOPY AND SYNOVECTOMY;  Surgeon: Velna Ochs, MD;  Location: [facility];  Service: Orthopedics;  Laterality: Right;  . LITHOTRIPSY     multiple, b/l  . SHOULDER ADHESION RELEASE Left ` 1998  . SHOULDER ARTHROSCOPY W/ ROTATOR CUFF REPAIR Left 10/20/2004   x 4, had to have part of clavile removed. torn rotator cuff multiple times, had to place screws   . SHOULDER ARTHROSCOPY W/ SUPERIOR LABRAL ANTERIOR POSTERIOR LESION REPAIR Left 12/22/2004  . TOTAL KNEE ARTHROPLASTY Right 12/23/2012   x3, 2 arthroscopies and a TKR,  TOTAL KNEE ARTHROPLASTY;  Surgeon: Velna Ochs, MD;  Location: [facility] OR;  Service: Orthopedics;  Laterality: Right;  DEPUY, RNFA  . WRIST SURGERY Right    x 3, torn ligaments, pins placed then infected, then fused with plate    Medications Prior to Admission  Medication Sig Dispense Refill Last Dose  . carvedilol (COREG) 12.5 MG tablet Take 1 tablet (12.5 mg total) by mouth 2 (two) times daily with a meal. 60 tablet 6 Taking  . escitalopram (LEXAPRO) 20 MG tablet TAKE 1 TABLET BY MOUTH ONCE DAILY 30 tablet 2   . insulin aspart (NOVOLOG FLEXPEN) 100 UNIT/ML FlexPen Inject 20-30 units 3 times daily with meals (Patient taking differently: Inject 20-30 Units into the skin 3 (three) times daily with meals. Inject 20-30 units 3 times daily with meals) 81 mL 0 Taking  . insulin detemir (LEVEMIR) 100 UNIT/ML injection Inject 0.6 mLs  (60 Units total) into the skin at bedtime. 54 mL 3 Taking  . lansoprazole (PREVACID) 30 MG capsule Take 30 mg by mouth daily.    Taking  . lisinopril (PRINIVIL,ZESTRIL) 10 MG tablet Take 1 tablet (10 mg total) by mouth daily. 90 tablet 0   . loratadine (CLARITIN) 10 MG tablet Take 10 mg by mouth daily.   Taking  . TRULICITY 1.5 MG/0.5ML SOPN INJECT 1 SYRINGE SUBCUTANEOUSLY ONCE A WEEK (Patient taking differently: INJECT 1 SYRINGE SUBCUTANEOUSLY ONCE A WEEK ON SATURDAYS) 4 pen 1     SH: Social History   Tobacco Use  . Smoking status: Never Smoker  . Smokeless tobacco: Current User    Types: Snuff  Substance Use Topics  . Alcohol use: No    Frequency: Never  . Drug use: No    MEDS: Prior to Admission medications   Medication Sig Start Date End Date Taking? Authorizing Provider  carvedilol (COREG) 12.5 MG tablet Take 1 tablet (12.5 mg total) by mouth 2 (two) times daily with a meal. 12/11/16  Yes Bradd CanaryBlyth, Stacey A, MD  escitalopram (LEXAPRO) 20 MG tablet TAKE 1 TABLET BY MOUTH ONCE DAILY 11/19/17  Yes Bradd CanaryBlyth, Stacey A, MD  insulin aspart (NOVOLOG FLEXPEN) 100 UNIT/ML FlexPen Inject 20-30 units 3 times daily with meals Patient taking differently: Inject 20-30 Units into the skin 3 (three) times daily with meals. Inject 20-30 units 3 times daily with meals 09/03/17  Yes Carlus PavlovGherghe, Cristina, MD  insulin detemir (LEVEMIR) 100 UNIT/ML injection Inject 0.6 mLs (60 Units total) into the skin at bedtime. 07/19/17  Yes Carlus PavlovGherghe, Cristina, MD  lansoprazole (PREVACID) 30 MG capsule Take 30 mg by mouth daily.    Yes [provider]  lisinopril (PRINIVIL,ZESTRIL) 10 MG tablet Take 1 tablet (10 mg total) by mouth daily. 12/02/17  Yes Copland, Gwenlyn FoundJessica C, MD  loratadine (CLARITIN) 10 MG tablet Take 10 mg by mouth daily.   Yes [provider]  TRULICITY 1.5 MG/0.5ML SOPN INJECT 1 SYRINGE SUBCUTANEOUSLY ONCE A WEEK Patient taking differently: INJECT 1 SYRINGE SUBCUTANEOUSLY ONCE A WEEK ON SATURDAYS  12/18/17  Yes Carlus PavlovGherghe, Cristina, MD    ALLERGY: Allergies  Allergen Reactions  . Toradol [Ketorolac Tromethamine] Itching    Social History   Tobacco Use  . Smoking status: Never Smoker  . Smokeless tobacco: Current User    Types: Snuff  Substance Use Topics  . Alcohol use: No    Frequency: Never     Family History  Problem Relation Age of Onset  . COPD Mother        smoker  . Hypertension Mother   . Heart disease Mother        CHF  . Diabetes Father   . Heart disease Father        quintuple bypass  . Stroke Father   . Hypertension Father   . Peripheral  vascular disease Father   . Hypertension Son        tachycardia  . Heart disease Maternal Grandfather   . Arthritis Paternal Grandmother   . Colon cancer Neg Hx   . Rectal cancer Neg Hx   . Stomach cancer Neg Hx      ROS   Review of Systems  Constitutional: Negative.   HENT: Negative.   Eyes: Negative.   Respiratory: Negative.   Cardiovascular: Negative.   Gastrointestinal: Negative.   Genitourinary: Negative.   Musculoskeletal: Negative.   Skin: Negative.   Neurological: Positive for headaches. Negative for dizziness, tingling, tremors, sensory change, speech change, focal weakness, seizures, loss of consciousness and weakness.  Psychiatric/Behavioral: Negative.     Exam   There were no vitals filed for this visit. General appearance: WDWN, NAD Eyes: PERRL, Fundoscopic: normal Cardiovascular: Regular rate and rhythm without murmurs, rubs, gallops. No edema or variciosities. Distal pulses normal. Pulmonary: Clear to auscultation Musculoskeletal:     Gait: normal    Muscle tone upper extremities: Normal    Muscle tone lower extremities: Normal    Motor exam: Upper Extremities Deltoid Bicep Tricep Grip  Right 5/5 5/5 5/5 5/5  Left 5/5 5/5 5/5 5/5   Lower Extremity IP Quad PF DF EHL  Right 5/5 5/5 5/5 5/5 5/5  Left 5/5 5/5 5/5 5/5 5/5   Neurological Awake, alert, oriented Memory and  concentration grossly intact Speech fluent, appropriate CNII: Visual fields normal CNIII/IV/VI: EOMI CNV: Facial sensation normal CNVII: Symmetric, normal strength CNVIII: Grossly normal CNIX: Normal palate movement CNXI: Trap and SCM strength normal CN XII: Tongue protrusion normal Sensation grossly intact to LT DTR: Normal Coordination (finger/nose & heel/shin): Normal  Results - Imaging/Labs   No results found for this or any previous visit (from the past 48 hour(s)).  No results found.  Impression/Plan   57 y.o. male with moderate to severe cervical stenosis seen on MRI of the cervical spine.  He presents today for C4-5 and C5-6 ACDF.   While in the office, Dr Conchita Paris reviewed the details of surgical decompression. The risks of surgery were discussed in detail with the patient which include but are not limited to spinal cord injury which may result in hand, leg, and bowel dysfunction, postoperative dysphagia, dysphonia, neck hematoma, or subsequent surgery for epidural hematoma.  The risk of CSF leak was also discussed. In addition, I explained to him that after spinal fusion surgery, there is a risk of adjacent level disease requiring future surgical intervention. The possibility of continued/worsening pain after surgery was also discussed. The general risks of anesthesia were also reviewed including heart attack, stroke, and DVT/PE.    He wishes to proceed. Consent signed.

## 2018-01-16 NOTE — Progress Notes (Signed)
Fitzgerald at bedside, give 5 of hydralazine and send to unit once diastolic is less than 100.  Hermina BartersBOWMAN, Adynn Caseres M, RN

## 2018-01-16 NOTE — Progress Notes (Signed)
Patient likely to be discharged later tonight if doing well.  Prescriptions, including narcotics, have been sent in electronically to pharmacy [facility] [place]

## 2018-01-16 NOTE — Op Note (Signed)
NEUROSURGERY OPERATIVE NOTE   PREOP DIAGNOSIS: Cervical stenosis  POSTOP DIAGNOSIS: Same  PROCEDURE: 1. Discectomy at C4-5, C5-6 for decompression of spinal cord  2. Placement of intervertebral biomechanical device, [employer] PTC PEEK 37m cage x2 3. Placement of anterior instrumentation consisting of interbody plate and screws spanning C4-C6 4. Use of morselized bone allograft  5. Arthrodesis C4-5, C5-6, anterior interbody technique  6. Use of intraoperative microscope  SURGEON: Dr. NConsuella Lose MD  ASSISTANT: Dr. JNewman Pies MD  ANESTHESIA: General Endotracheal  EBL: 30cc  SPECIMENS: None  DRAINS: None  COMPLICATIONS: None immediate  CONDITION: Hemodynamically stable to PACU  HISTORY: Sean TAITEis a 57y.o. man with chronic neck and arm pain which was treated well with conservative treatment including spinal epidural steroid injection.  His MRI however demonstrated significant central spinal stenosis with spinal cord compression at C4-5 and C5-6.  With these findings surgical decompression was indicated.  The risks and benefits of surgery were explained in detail to the patient and the family.  After all questions were answered informed consent was obtained and witnessed.  PROCEDURE IN DETAIL: The patient was brought to the operating room and transferred to the operative table. After induction of general anesthesia, the patient was positioned on the operative table in the supine position with all pressure points meticulously padded. The skin of the neck was then prepped and draped in the usual sterile fashion.  After timeout was conducted, the skin was infiltrated with local anesthetic. Skin incision was then made sharply and Bovie electrocautery was used to dissect the subcutaneous tissue until the platysma was identified. The platysma was then divided and undermined. The sternocleidomastoid muscle was then identified and, utilizing natural fascial planes in  the neck, the prevertebral fascia was identified and the carotid sheath was retracted laterally and the trachea and esophagus retracted medially. Again using fluoroscopy, the correct disc spaces were identified. Bovie electrocautery was used to dissect in the subperiosteal plane and elevate the bilateral longus coli muscles. Table mounted retractors were then placed. At this point, the microscope was draped and brought into the field, and the remainder of the case was done under the microscope using microdissecting technique.  The C4-5 disc space was incised sharply and rongeurs were use to initially complete a discectomy. The high-speed drill was then used to complete discectomy until the posterior annulus was identified and removed and the posterior longitudinal ligament was identified. Using a nerve hook, the PLL was elevated, and Kerrison rongeurs were used to remove the posterior longitudinal ligament and the ventral thecal sac was identified.  There were significant posterior osteophytes and a significant amount of spondylotic tissue compressing the spinal cord.  Using a combination of curettes and rongeurs, complete decompression of the thecal sac and exiting nerve roots at this level was completed, and verified using micro-nerve hook.  At this point, a 622minterbody cage was sized and packed with morcellized bone allograft. This was then inserted and tapped into place.  Attention was then turned to the C5-6 level. In a similar fashion, discectomy was completed initially with curettes and rongeurs, and completed with the drill. The PLL was again identified, elevated and incised. Using Kerrison rongeurs, decompression of the spinal cord and exiting roots  was completed and confirmed with a dissector. Again, there was significant posterior osteophytosis especially on the left compressing the left hemicord.  A 35m47mnterbody cage was then sized and filled with bone allograft, and tapped into place.  Position of the  interbody devices was then confirmed with fluoroscopy.  After placement of the intervertebral devices, the anterior cervical plate was selected, and placed across the interspaces. Using a high-speed drill, the cortex of the cervical vertebral bodies was punctured, and screws inserted in the C4, C5, C6 levels. Final fluoroscopic images in lateral projection were taken to confirm good hardware placement.  At this point, after all counts were verified to be correct, meticulous hemostasis was secured using a combination of bipolar electrocautery and passive hemostatics. The platysma muscle was then closed using interrupted 3-0 Vicryl sutures, and the skin was closed with a interrupted subcuticular stitch. Sterile dressings were then applied and the drapes removed.  The patient tolerated the procedure well and was extubated in the room and taken to the postanesthesia care unit in stable condition.

## 2018-01-16 NOTE — Progress Notes (Signed)
Discharged instructions/education/AVS/Rx given to patient with son  at bedside and they both verbalized understanding. MAE well, ambulating independently with supervision in hallway. Swallowing well with no complaints. Voiding with no difficulty. Pain is mild to moderate per patient. No swelling, no drainage, no redness noted on incision site. BP is more stable and DBP is on 80's. Discharged via wheelchair.

## 2018-01-16 NOTE — Anesthesia Postprocedure Evaluation (Signed)
Anesthesia Post Note  Patient: Sean Horn  Procedure(s) Performed: ANTERIOR CERVICAL DECOMPRESSION/DISCECTOMY FUSION CERVICAL FOUR-FIVE, CERVICAL FIVE-SIX (N/A )     Patient location during evaluation: PACU Anesthesia Type: General Level of consciousness: awake Pain management: pain level controlled Vital Signs Assessment: post-procedure vital signs reviewed and stable Respiratory status: spontaneous breathing Cardiovascular status: stable Anesthetic complications: no    Last Vitals:  Vitals:   01/16/18 1552 01/16/18 1607  BP: (!) 150/99 (!) 158/100  Pulse: 99   Resp: 18   Temp:  36.6 C  SpO2: 92%     Last Pain:  Vitals:   01/16/18 1629  TempSrc:   PainSc: 5                  [name]

## 2018-01-16 NOTE — Transfer of Care (Signed)
Immediate Anesthesia Transfer of Care Note  Patient: Sean Horn  Procedure(s) Performed: ANTERIOR CERVICAL DECOMPRESSION/DISCECTOMY FUSION CERVICAL FOUR-FIVE, CERVICAL FIVE-SIX (N/A )  Patient Location: PACU  Anesthesia Type:General  Level of Consciousness: awake, alert  and oriented  Airway & Oxygen Therapy: Patient Spontanous Breathing and Patient connected to nasal cannula oxygen  Post-op Assessment: Report given to RN and Post -op Vital signs reviewed and stable  Post vital signs: Reviewed and stable  Last Vitals:  Vitals Value Taken Time  BP    Temp    Pulse 103 01/16/2018  3:36 PM  Resp 9 01/16/2018  3:36 PM  SpO2 90 % 01/16/2018  3:36 PM  Vitals shown include unvalidated device data.  Last Pain:  Vitals:   01/16/18 1053  TempSrc:   PainSc: 4       Patients Stated Pain Goal: 3 (01/16/18 1053)  Complications: No apparent anesthesia complications

## 2018-01-16 NOTE — Progress Notes (Signed)
Dr. Maple HudsonMoser notified of pts high blood pressures. 160s/100s. Order for 2.5mg  of Metoprolol to be given. If SBP doesn't go below 160 and DBP doesn't go below 100, given an additional 2.5mg  of Metoprolol.

## 2018-01-16 NOTE — Progress Notes (Signed)
Spoke with Sampson GoonFitzgerald, E regarding blood pressure, verbal order to give 10mg  of labetalol.  Marin CommentFrei, Cornelious Bartolucci C, RN

## 2018-01-16 NOTE — Discharge Summary (Signed)
Physician Discharge Summary  Patient ID: Sean Horn MRN: [id number] DOB/AGE: [birth year] 57 y.o.  Admit date: 01/16/2018 Discharge date: 01/16/2018  Admission Diagnoses:  Cervical stenosis of spine  Discharge Diagnoses:  Same Active Problems:   Cervical stenosis of spine  Discharged Condition: Stable  Hospital Course:  Sean Horn is a 57 y.o. male who was admitted for the below procedure. There were no post operative complications. At time of discharge, pain was well controlled, ambulating with Pt/OT, tolerating po, voiding normal. Ready for discharge.  Treatments: Surgery - C4-5, C5-6 ACDF  Discharge Exam: Blood pressure (!) 156/96, pulse 87, temperature 97.7 F (36.5 C), temperature source Oral, resp. rate 18, height 6' (1.829 m), weight 99.3 kg (219 lb), SpO2 98 %. Awake, alert, oriented Speech fluent, appropriate CN grossly intact 5/5 BUE/BLE Wound c/d/i  Disposition: Discharge disposition: 01-Home or Self Care       Discharge Instructions    Call MD for:  difficulty breathing, headache or visual disturbances   Complete by:  As directed    Call MD for:  persistant dizziness or light-headedness   Complete by:  As directed    Call MD for:  redness, tenderness, or signs of infection (pain, swelling, redness, odor or green/yellow discharge around incision site)   Complete by:  As directed    Call MD for:  severe uncontrolled pain   Complete by:  As directed    Call MD for:  temperature >100.4   Complete by:  As directed    Diet general   Complete by:  As directed    Driving Restrictions   Complete by:  As directed    Do not drive until given clearance.   Increase activity slowly   Complete by:  As directed    Lifting restrictions   Complete by:  As directed    Do not lift anything >10lbs. Avoid bending and twisting in awkward positions. Avoid bending at the back.   May shower / Bathe   Complete by:  As directed    In 24 hours. Okay to wash wound  with warm soapy water. Avoid scrubbing the wound. Pat dry.   Remove dressing in 24 hours   Complete by:  As directed      Allergies as of 01/16/2018      Reactions   Toradol [ketorolac Tromethamine] Itching      Medication List    TAKE these medications   carvedilol 12.5 MG tablet Commonly known as:  COREG Take 1 tablet (12.5 mg total) by mouth 2 (two) times daily with a meal.   escitalopram 20 MG tablet Commonly known as:  LEXAPRO TAKE 1 TABLET BY MOUTH ONCE DAILY   insulin aspart 100 UNIT/ML FlexPen Commonly known as:  NOVOLOG FLEXPEN Inject 20-30 units 3 times daily with meals What changed:    how much to take  how to take this  when to take this  additional instructions   insulin detemir 100 UNIT/ML injection Commonly known as:  LEVEMIR Inject 0.6 mLs (60 Units total) into the skin at bedtime.   lansoprazole 30 MG capsule Commonly known as:  PREVACID Take 30 mg by mouth daily.   lisinopril 10 MG tablet Commonly known as:  PRINIVIL,ZESTRIL Take 1 tablet (10 mg total) by mouth daily.   loratadine 10 MG tablet Commonly known as:  CLARITIN Take 10 mg by mouth daily.   methocarbamol 500 MG tablet Commonly known as:  ROBAXIN Take 1 tablet (500 mg total) by  mouth every 6 (six) hours as needed for muscle spasms.   oxyCODONE-acetaminophen 7.5-325 MG tablet Commonly known as:  PERCOCET Take 1 tablet by mouth every 4 (four) hours as needed for severe pain.   TRULICITY 1.5 MG/0.5ML Sopn Generic drug:  Dulaglutide INJECT 1 SYRINGE SUBCUTANEOUSLY ONCE A WEEK What changed:  See the new instructions.      Follow-up Information    Lisbeth RenshawNundkumar, Neelesh, MD. Schedule an appointment as soon as possible for a visit in 3 week(s).   Specialty:  Neurosurgery Contact information: [address] [place] [address] [phone]           Signed: Alyson InglesCOSTELLA, Graysen Depaula J 01/16/2018, 3:30 PM

## 2018-01-16 NOTE — Progress Notes (Signed)
Dr. Maple HudsonMoser updated on pts blood pressure. Pressures are still maintaining a diastolic above 100. Order for 10mg  of Labetalol to be given now.

## 2018-01-16 NOTE — Anesthesia Procedure Notes (Signed)
Procedure Name: Intubation Date/Time: 01/16/2018 1:13 PM Performed by: Adria Dillonkin, Keagen Heinlen A, CRNA Pre-anesthesia Checklist: Patient identified, Emergency Drugs available, Suction available and Patient being monitored Patient Re-evaluated:Patient Re-evaluated prior to induction Oxygen Delivery Method: Circle system utilized Preoxygenation: Pre-oxygenation with 100% oxygen Induction Type: IV induction Ventilation: Mask ventilation without difficulty Laryngoscope Size: Glidescope and 3 Grade View: Grade I Tube size: 7.5 mm Number of attempts: 1 Airway Equipment and Method: Video-laryngoscopy and Rigid stylet Placement Confirmation: ETT inserted through vocal cords under direct vision,  positive ETCO2,  CO2 detector and breath sounds checked- equal and bilateral Secured at: 21 cm Tube secured with: Tape Dental Injury: Teeth and Oropharynx as per pre-operative assessment

## 2018-01-16 NOTE — Progress Notes (Signed)
Spoke to Dr. Neva SeatGreene about patients blood sugar and blood pressure  No further orders received for blood sugar. Will recheck in 1 hour  Dr. Neva SeatGreene stated that we should continue to monitor blood pressure and as long as the diastolic stays below 110 then we can proceed.  If it does not get below 110 then we need to discuss with Dr. Conchita ParisNundkumar

## 2018-01-16 NOTE — Progress Notes (Signed)
Pts CBG on arrival to PACU was 269. Dr. Neva SeatGreene made aware. No new orders given.

## 2018-01-17 NOTE — Telephone Encounter (Signed)
Pt failed to respond to voice messages closed referral.

## 2018-01-20 ENCOUNTER — Encounter (HOSPITAL_COMMUNITY): Payer: Self-pay | Admitting: Neurosurgery

## 2018-02-01 ENCOUNTER — Other Ambulatory Visit: Payer: Self-pay | Admitting: Family Medicine

## 2018-02-01 DIAGNOSIS — F419 Anxiety disorder, unspecified: Secondary | ICD-10-CM

## 2018-02-04 ENCOUNTER — Ambulatory Visit: Payer: Medicare HMO | Admitting: Internal Medicine

## 2018-02-04 DIAGNOSIS — Z0289 Encounter for other administrative examinations: Secondary | ICD-10-CM

## 2018-02-04 NOTE — Progress Notes (Deleted)
Patient ID: Sean Horn, male   DOB: 10-May-1961, 57 y.o.   MRN: [id number]   HPI: Sean Horn is a 57 y.o.-year-old male, returning for follow-up for DM2, dx in 2013, insulin-dependent since 2017, uncontrolled, without long-term complications. He was previously seen by Dr. Chestine Spore.  Last visit 7 months ago.  He did not return in 1.5 months as advised.  Last hemoglobin A1c was: Lab Results  Component Value Date   HGBA1C 8.9 (H) 01/13/2018   HGBA1C 13.7 05/27/2017   HGBA1C 9.8 (H) 12/11/2016   He is currently on: - Metformin 1000 mg 2x a day with meals - Trulicity 1.5 mg weekly - Levemir 60 units at bedtime - Humalog: 20 units for a smaller meal 25 units for a regular meal 30 units for a larger meal He tried Invokana >> worked but cannot remember why he stopped (believes 2/2 $$$)  Pt checks his sugars 1-2 times a day per review of his meter: - am: 160-300s, 400 >> 298-400 - 2h after b'fast: n/c >> 336 - before lunch: 80 x1 (working outside) >> >600 - 2h after lunch: n/c - before dinner: n/c - 2h after dinner: n/c - bedtime: 200-300s - nighttime: n/c Lowest sugar was 80 >> 200s >> ***; he has hypoglycemia awareness in the 80s. Highest sugar was 400s >> HI >> ***  Glucometer: One Touch Ultra  Pt's meals are: - Breakfast: cereal: cheerios + 2% milk - Lunch: sandwich  - Dinner: meat + veggies and less starches - Snacks: after dinner - nuts; granola; sugar-free cookies Midnight snack: e.g. banana   -No CKD, last BUN/creatinine:  Lab Results  Component Value Date   BUN 8 01/13/2018   BUN 10 08/28/2017   CREATININE 0.80 01/13/2018   CREATININE 0.71 08/28/2017  On  lisinopril 10. - + HL; last set of lipids: Lab Results  Component Value Date   CHOL 200 12/11/2016   HDL 39.50 12/11/2016   LDLDIRECT 128.0 12/11/2016   TRIG 269.0 (H) 12/11/2016   CHOLHDL 5 12/11/2016   On Crestor 10 mg daily- started 12/2016 - last eye exam was in  10/2016: No DR -  No numbness  and tingling in his feet.  He has family history of diabetes in father.  ROS: Constitutional: no weight gain/no weight loss, no fatigue, no subjective hyperthermia, no subjective hypothermia Eyes: no blurry vision, no xerophthalmia ENT: no sore throat, no nodules palpated in throat, no dysphagia, no odynophagia, no hoarseness Cardiovascular: no CP/no SOB/no palpitations/no leg swelling Respiratory: no cough/no SOB/no wheezing Gastrointestinal: no N/no V/no D/no C/no acid reflux Musculoskeletal: no muscle aches/no joint aches Skin: no rashes, no hair loss Neurological: no tremors/no numbness/no tingling/no dizziness  I reviewed pt's medications, allergies, PMH, social hx, family hx, and changes were documented in the history of present illness. Otherwise, unchanged from my initial visit note.  Past Medical History:  Diagnosis Date  . Anxiety   . GERD (gastroesophageal reflux disease)    occasional - OTC as needed  . History of chicken pox   . History of kidney stones   . History of shingles   . Hypertension    under control with med., had been on med. since age 57  . Insomnia 09/13/2016  . Insulin dependent diabetes mellitus (HCC)    Follows with endocrinology, Dr Chestine Spore reports his last hgba1c was 7.3   . Non-insulin dependent type 2 diabetes mellitus (HCC)   . Obesity 01/30/2017  . Osteoarthritis    right  knee  . PONV (postoperative nausea and vomiting)   . PONV (postoperative nausea and vomiting)   . Preventative health care 08/14/2013  . Tachycardia   . Urinary frequency 12/11/2016   Past Surgical History:  Procedure Laterality Date  . ANTERIOR CERVICAL DECOMP/DISCECTOMY FUSION N/A 01/16/2018   Procedure: ANTERIOR CERVICAL DECOMPRESSION/DISCECTOMY FUSION CERVICAL FOUR-FIVE, CERVICAL FIVE-SIX;  Surgeon: Lisbeth RenshawNundkumar, Neelesh, MD;  Location: [facility];  Service: Neurosurgery;  Laterality: N/A;  ANTERIOR CERVICAL DECOMPRESSION/DISCECTOMY FUSION CERVICAL FOUR-FIVE, CERVICAL FIVE-SIX  .  CHOLECYSTECTOMY  1980's  . CYSTOSCOPY/RETROGRADE/URETEROSCOPY/STONE EXTRACTION WITH BASKET Right 08/27/2003   and double J stent placement  . INGUINAL HERNIA REPAIR Left 1990's  . KNEE ARTHROSCOPY Left 1980's  . KNEE ARTHROSCOPY Right 2013  . KNEE ARTHROSCOPY Right 06/30/2013   Procedure: RIGHT KNEE ARTHROSCOPY AND SYNOVECTOMY;  Surgeon: Velna OchsPeter G Dalldorf, MD;  Location: [facility];  Service: Orthopedics;  Laterality: Right;  . LITHOTRIPSY     multiple, b/l  . SHOULDER ADHESION RELEASE Left ` 1998  . SHOULDER ARTHROSCOPY W/ ROTATOR CUFF REPAIR Left 10/20/2004   x 4, had to have part of clavile removed. torn rotator cuff multiple times, had to place screws   . SHOULDER ARTHROSCOPY W/ SUPERIOR LABRAL ANTERIOR POSTERIOR LESION REPAIR Left 12/22/2004  . TOTAL KNEE ARTHROPLASTY Right 12/23/2012   x3, 2 arthroscopies and a TKR,  TOTAL KNEE ARTHROPLASTY;  Surgeon: Velna OchsPeter G Dalldorf, MD;  Location: [facility] OR;  Service: Orthopedics;  Laterality: Right;  DEPUY, RNFA  . WRIST SURGERY Right    x 3, torn ligaments, pins placed then infected, then fused with plate   Social History   Social History  . Marital status: Single    Spouse name: N/A  . Number of children: 2   Occupational History  . Retired, disability after his R knee sx.   Social History Main Topics  . Smoking status: Never Smoker  . Smokeless tobacco: Current User    Types: Snuff  . Alcohol use Yes     Comment: occasionally  . Drug use: No  . Sexual activity: Yes     Comment: currently applying for disability due to knee, lives with girfriend, no dietary restrictions, previously Games developerdiesel mechanic   Current Outpatient Medications on File Prior to Visit  Medication Sig Dispense Refill  . carvedilol (COREG) 12.5 MG tablet Take 1 tablet (12.5 mg total) by mouth 2 (two) times daily with a meal. 60 tablet 6  . escitalopram (LEXAPRO) 20 MG tablet TAKE 1 TABLET BY MOUTH ONCE DAILY 90 tablet 0  . insulin aspart (NOVOLOG FLEXPEN)  100 UNIT/ML FlexPen Inject 20-30 units 3 times daily with meals (Patient taking differently: Inject 20-30 Units into the skin 3 (three) times daily with meals. Inject 20-30 units 3 times daily with meals) 81 mL 0  . insulin detemir (LEVEMIR) 100 UNIT/ML injection Inject 0.6 mLs (60 Units total) into the skin at bedtime. 54 mL 3  . lansoprazole (PREVACID) 30 MG capsule Take 30 mg by mouth daily.     Marland Kitchen. lisinopril (PRINIVIL,ZESTRIL) 10 MG tablet Take 1 tablet (10 mg total) by mouth daily. 90 tablet 0  . loratadine (CLARITIN) 10 MG tablet Take 10 mg by mouth daily.    . methocarbamol (ROBAXIN) 500 MG tablet Take 1 tablet (500 mg total) by mouth every 6 (six) hours as needed for muscle spasms. 90 tablet 2  . oxyCODONE-acetaminophen (PERCOCET) 7.5-325 MG tablet Take 1 tablet by mouth every 4 (four) hours as needed for severe pain. 60  tablet 0  . TRULICITY 1.5 MG/0.5ML SOPN INJECT 1 SYRINGE SUBCUTANEOUSLY ONCE A WEEK (Patient taking differently: INJECT 1 SYRINGE SUBCUTANEOUSLY ONCE A WEEK ON SATURDAYS) 4 pen 1   No current facility-administered medications on file prior to visit.    Allergies  Allergen Reactions  . Toradol [Ketorolac Tromethamine] Itching   Family History  Problem Relation Age of Onset  . COPD Mother        smoker  . Hypertension Mother   . Heart disease Mother        CHF  . Diabetes Father   . Heart disease Father        quintuple bypass  . Stroke Father   . Hypertension Father   . Peripheral vascular disease Father   . Hypertension Son        tachycardia  . Heart disease Maternal Grandfather   . Arthritis Paternal Grandmother   . Colon cancer Neg Hx   . Rectal cancer Neg Hx   . Stomach cancer Neg Hx    PE: There were no vitals taken for this visit. Wt Readings from Last 3 Encounters:  01/16/18 219 lb (99.3 kg)  01/13/18 219 lb (99.3 kg)  10/01/17 225 lb 9.6 oz (102.3 kg)   Constitutional: overweight, in NAD Eyes: PERRLA, EOMI, no exophthalmos ENT: moist mucous  membranes, no thyromegaly, no cervical lymphadenopathy Cardiovascular: RRR, No MRG Respiratory: CTA B Gastrointestinal: abdomen soft, NT, ND, BS+ Musculoskeletal: no deformities, strength intact in all 4 Skin: moist, warm, no rashes Neurological: no tremor with outstretched hands, DTR normal in all 4  ASSESSMENT: 1. DM2, insulin-dependent, uncontrolled, without long term complications, but with significant hyperglycemia  2. HL  PLAN:  1. Patient with history of uncontrolled diabetes, currently on metformin, basal-bolus insulin, and GLP-1 receptor agonist added at last visit.  At that time, HbA1c was very high, at 13.7%.  We also increased his Humalog doses then. We also discussed about healthier eating.  He was then lost for follow-up and returns after 7 months. - Since  last visit, sugars improved significantly  - reviewed his most recent HbA1c and this was impressively improved, to 8.9%. - I suggested to:  Patient Instructions  Please continue: - Metformin 1000 mg 2x a day with meals - Trulicity 1.5 mg weekly - Levemir 60 units at bedtime - Humalog: 20 units for a smaller meal 25 units for a regular meal 30 units for a larger meal  Please return in 3 months with your sugar log.   - continue checking sugars at different times of the day - check 3x a day, rotating checks - advised for yearly eye exams >> he is UTD - Return to clinic in 3 mo with sugar log    2. HL  - Reviewed latest lipid panel from 12/2016: Both triglycerides and LDL were high.  He was started on Crestor at that time. - Continues Crestor without side effects.  Carlus Pavlov, MD PhD [facility] Endocr[facility]ology

## 2018-02-05 DIAGNOSIS — I10 Essential (primary) hypertension: Secondary | ICD-10-CM | POA: Diagnosis not present

## 2018-02-05 DIAGNOSIS — M4802 Spinal stenosis, cervical region: Secondary | ICD-10-CM | POA: Diagnosis not present

## 2018-02-05 DIAGNOSIS — Z6828 Body mass index (BMI) 28.0-28.9, adult: Secondary | ICD-10-CM | POA: Diagnosis not present

## 2018-02-27 ENCOUNTER — Encounter: Payer: Self-pay | Admitting: Family Medicine

## 2018-02-28 ENCOUNTER — Ambulatory Visit (HOSPITAL_BASED_OUTPATIENT_CLINIC_OR_DEPARTMENT_OTHER)
Admission: RE | Admit: 2018-02-28 | Discharge: 2018-02-28 | Disposition: A | Discharge status: Home / Self Care | Payer: Medicare HMO | Source: Ambulatory Visit | Attending: Medical | Admitting: Medical

## 2018-02-28 ENCOUNTER — Encounter: Payer: Self-pay | Admitting: Medical

## 2018-02-28 ENCOUNTER — Ambulatory Visit (INDEPENDENT_AMBULATORY_CARE_PROVIDER_SITE_OTHER): Payer: Medicare HMO | Admitting: Medical

## 2018-02-28 VITALS — BP 125/80 | HR 92 | Temp 97.6°F | Resp 16 | Ht 72.0 in | Wt 213.8 lb

## 2018-02-28 DIAGNOSIS — M25532 Pain in left wrist: Secondary | ICD-10-CM

## 2018-02-28 DIAGNOSIS — S6992XA Unspecified injury of left wrist, hand and finger(s), initial encounter: Secondary | ICD-10-CM | POA: Diagnosis not present

## 2018-02-28 DIAGNOSIS — M659 Synovitis and tenosynovitis, unspecified: Secondary | ICD-10-CM | POA: Diagnosis not present

## 2018-02-28 MED ORDER — GABAPENTIN 100 MG PO CAPS: 100.0000 mg | ORAL_CAPSULE | Freq: Every day | ORAL | 0 refills | Status: DC

## 2018-02-28 NOTE — Progress Notes (Signed)
Subjective:    Patient ID: Sean Horn, male    DOB: August 22, 1960, 57 y.o.   MRN: [id number]  HPI  Pt in for follow up.  States has left wrist pain for about 3 months. Pain was mild at first. Then 2 months ago he fell and pain got worse. Pt is rt handed. Pt states he has applied ice to area and then used a wrist cock up splint. He has tried not use the wrist. He states cock up splint made are hurt worse.  Pt states prednisone for which he used for neck pain did not help. Then later tried biofreeze, alleve, ibuproen, and tylenol. Even percocet did not help his wrist.  Pain is sharp on and off all day.     Review of Systems  Constitutional: Negative for chills, fatigue and fever.  Respiratory: Negative for cough and chest tightness.   Cardiovascular: Negative for chest pain and palpitations.  Genitourinary: Negative for decreased urine volume.  Musculoskeletal:       See hpi.  Skin: Negative for rash.  Hematological: Negative for adenopathy.    Past Medical History:  Diagnosis Date  . Anxiety   . GERD (gastroesophageal reflux disease)    occasional - OTC as needed  . History of chicken pox   . History of kidney stones   . History of shingles   . Hypertension    under control with med., had been on med. since age 45  . Insomnia 09/13/2016  . Insulin dependent diabetes mellitus (HCC)    Follows with endocrinology, Dr Chestine Spore reports his last hgba1c was 7.3   . Non-insulin dependent type 2 diabetes mellitus (HCC)   . Obesity 01/30/2017  . Osteoarthritis    right knee  . PONV (postoperative nausea and vomiting)   . PONV (postoperative nausea and vomiting)   . Preventative health care 08/14/2013  . Tachycardia   . Urinary frequency 12/11/2016     Social History   Socioeconomic History  . Marital status: Single    Spouse name: Not on file  . Number of children: 2  . Years of education: Not on file  . Highest education level: High school graduate  Occupational History    . Occupation: Disabled  Social Needs  . Financial resource strain: Not on file  . Food insecurity:    Worry: Not on file    Inability: Not on file  . Transportation needs:    Medical: Not on file    Non-medical: Not on file  Tobacco Use  . Smoking status: Never Smoker  . Smokeless tobacco: Current User    Types: Snuff  Substance and Sexual Activity  . Alcohol use: No    Frequency: Never  . Drug use: No  . Sexual activity: Yes  Lifestyle  . Physical activity:    Days per week: Not on file    Minutes per session: Not on file  . Stress: Not on file  Relationships  . Social connections:    Talks on phone: Not on file    Gets together: Not on file    Attends religious service: Not on file    Active member of club or organization: Not on file    Attends meetings of clubs or organizations: Not on file    Relationship status: Not on file  . Intimate partner violence:    Fear of current or ex partner: Not on file    Emotionally abused: Not on file    Physically  abused: Not on file    Forced sexual activity: Not on file  Other Topics Concern  . Not on file  Social History Narrative   Lives at home with his father (he takes care of his father)   Right handed   Drinks 2 cups of caffeine daily    Past Surgical History:  Procedure Laterality Date  . ANTERIOR CERVICAL DECOMP/DISCECTOMY FUSION N/A 01/16/2018   Procedure: ANTERIOR CERVICAL DECOMPRESSION/DISCECTOMY FUSION CERVICAL FOUR-FIVE, CERVICAL FIVE-SIX;  Surgeon: Lisbeth Renshaw, MD;  Location: [facility];  Service: Neurosurgery;  Laterality: N/A;  ANTERIOR CERVICAL DECOMPRESSION/DISCECTOMY FUSION CERVICAL FOUR-FIVE, CERVICAL FIVE-SIX  . CHOLECYSTECTOMY  1980's  . CYSTOSCOPY/RETROGRADE/URETEROSCOPY/STONE EXTRACTION WITH BASKET Right 08/27/2003   and double J stent placement  . INGUINAL HERNIA REPAIR Left 1990's  . KNEE ARTHROSCOPY Left 1980's  . KNEE ARTHROSCOPY Right 2013  . KNEE ARTHROSCOPY Right 06/30/2013   Procedure:  RIGHT KNEE ARTHROSCOPY AND SYNOVECTOMY;  Surgeon: Velna Ochs, MD;  Location: [facility];  Service: Orthopedics;  Laterality: Right;  . LITHOTRIPSY     multiple, b/l  . SHOULDER ADHESION RELEASE Left ` 1998  . SHOULDER ARTHROSCOPY W/ ROTATOR CUFF REPAIR Left 10/20/2004   x 4, had to have part of clavile removed. torn rotator cuff multiple times, had to place screws   . SHOULDER ARTHROSCOPY W/ SUPERIOR LABRAL ANTERIOR POSTERIOR LESION REPAIR Left 12/22/2004  . TOTAL KNEE ARTHROPLASTY Right 12/23/2012   x3, 2 arthroscopies and a TKR,  TOTAL KNEE ARTHROPLASTY;  Surgeon: Velna Ochs, MD;  Location: [facility];  Service: Orthopedics;  Laterality: Right;  DEPUY, RNFA  . WRIST SURGERY Right    x 3, torn ligaments, pins placed then infected, then fused with plate    Family History  Problem Relation Age of Onset  . COPD Mother        smoker  . Hypertension Mother   . Heart disease Mother        CHF  . Diabetes Father   . Heart disease Father        quintuple bypass  . Stroke Father   . Hypertension Father   . Peripheral vascular disease Father   . Hypertension Son        tachycardia  . Heart disease Maternal Grandfather   . Arthritis Paternal Grandmother   . Colon cancer Neg Hx   . Rectal cancer Neg Hx   . Stomach cancer Neg Hx     Allergies  Allergen Reactions  . Toradol [Ketorolac Tromethamine] Itching    Current Outpatient Medications on File Prior to Visit  Medication Sig Dispense Refill  . carvedilol (COREG) 12.5 MG tablet Take 1 tablet (12.5 mg total) by mouth 2 (two) times daily with a meal. 60 tablet 6  . escitalopram (LEXAPRO) 20 MG tablet TAKE 1 TABLET BY MOUTH ONCE DAILY 90 tablet 0  . insulin aspart (NOVOLOG FLEXPEN) 100 UNIT/ML FlexPen Inject 20-30 units 3 times daily with meals (Patient taking differently: Inject 20-30 Units into the skin 3 (three) times daily with meals. Inject 20-30 units 3 times daily with meals) 81 mL 0  . insulin detemir  (LEVEMIR) 100 UNIT/ML injection Inject 0.6 mLs (60 Units total) into the skin at bedtime. 54 mL 3  . lansoprazole (PREVACID) 30 MG capsule Take 30 mg by mouth daily.     Marland Kitchen lisinopril (PRINIVIL,ZESTRIL) 10 MG tablet Take 1 tablet (10 mg total) by mouth daily. 90 tablet 0  . loratadine (CLARITIN) 10 MG tablet Take 10  mg by mouth daily.    . methocarbamol (ROBAXIN) 500 MG tablet Take 1 tablet (500 mg total) by mouth every 6 (six) hours as needed for muscle spasms. 90 tablet 2  . TRULICITY 1.5 MG/0.5ML SOPN INJECT 1 SYRINGE SUBCUTANEOUSLY ONCE A WEEK (Patient taking differently: INJECT 1 SYRINGE SUBCUTANEOUSLY ONCE A WEEK ON SATURDAYS) 4 pen 1   No current facility-administered medications on file prior to visit.     BP (!) 135/100   Pulse 92   Temp 97.6 F (36.4 C) (Oral)   Resp 16   Ht 6' (1.829 m)   Wt 213 lb 12.8 oz (97 kg)   SpO2 100%   BMI 29.00 kg/m       Objective:   Physical Exam  General- No acute distress. Pleasant patient.  Lungs- Clear, even and unlabored. Heart- regular rate and rhythm. Neurologic- CNII- XII grossly intact.  Left wrist- mild pain on palpation distal radius. + finklestein test in region of wrist but not into thumb. No redness, no warmth, no rash. Left hand- no tenderness.         Assessment & Plan:  You do appear to have tenosynovitis of the wrist.  You have tried various medications and conservative measures yet the pain persist.  We will get x-ray today of wrist and gave you a thumb spica splint.  Please use the splint daily.  I do think anti-inflammatory would be beneficial and recommend ibuprofen 400 to 600 mg every 8 hours.  Some of your description of pain features appears neuropathic/nerve like.  Since you have failed various medications we will try gabapentin 100 mg tablets at night.  I have placed referral to sports medicine and am asking them to get you in as soon as possible/sometime next week.  Follow-up with us in a week to 10  days provided that referral to sports medicine is delayed.  Esperanza RichtersEdward Yamilka Lopiccolo, PA-C

## 2018-02-28 NOTE — Patient Instructions (Signed)
You do appear to have tenosynovitis of the wrist.  You have tried various medications and conservative measures yet the pain persist.  We will get x-ray today of wrist and gave you a thumb spica splint.  Please use the splint daily.  I do think anti-inflammatory would be beneficial and recommend ibuprofen 400 to 600 mg every 8 hours.  Some of your description of pain features appears neuropathic/nerve like.  Since you have failed various medications we will try gabapentin 100 mg tablets at night.  I have placed referral to sports medicine and am asking them to get you in as soon as possible/sometime next week.  Follow-up with us in a week to 10 days provided that referral to sports medicine is delayed.

## 2018-03-04 ENCOUNTER — Ambulatory Visit: Payer: Medicare HMO | Admitting: Family Medicine

## 2018-03-04 ENCOUNTER — Encounter: Payer: Self-pay | Admitting: Family Medicine

## 2018-03-04 VITALS — BP 134/91 | HR 94 | Ht 72.0 in | Wt 213.0 lb

## 2018-03-04 DIAGNOSIS — M654 Radial styloid tenosynovitis [de Quervain]: Secondary | ICD-10-CM | POA: Diagnosis not present

## 2018-03-04 DIAGNOSIS — M25532 Pain in left wrist: Secondary | ICD-10-CM

## 2018-03-04 MED ORDER — METHYLPREDNISOLONE ACETATE 40 MG/ML IJ SUSP
20.0000 mg | Freq: Once | INTRAMUSCULAR | Status: AC
  Administered 2018-03-04: 20 mg via INTRA_ARTICULAR

## 2018-03-04 NOTE — Patient Instructions (Signed)
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 Avoid painful activities as much as possible. Wear the thumb spica brace as often as possible to rest this. Ice 15 minutes at a time 3-4 times a day. A cortisone injection typically helps a great deal with this and is an option - you were given this today Follow up with me in 1 month for reevaluation. 

## 2018-03-05 ENCOUNTER — Encounter: Payer: Self-pay | Admitting: Family Medicine

## 2018-03-05 NOTE — Progress Notes (Addendum)
PCP: Bradd CanaryBlyth, Stacey A, MD Consultation requested by Esperanza RichtersEdward Saguier PA-C  Subjective:   HPI: Patient is a 57 y.o. male here for left wrist pain.  Patient reports for about 4 months he has had radial sided left wrist pain. He recalls about a year ago he had similar pain went to an urgent care, had an injection which helped. He also sustained a fall in May 2019 causing a FOOSH injury of his left wrist. This felt like it flared the pain on the radial side of his left wrist. He is tried brace, thumb spica, icing, prednisone Dosepak, Biofreeze, Aleve, ibuprofen, Tylenol, Percocet. Do not feel there was any significant benefit with these measures. Pain is worse at night. Pain is 5 out of 10 and sharp especially with motions of the hand and thumb. No skin changes or numbness. Right-handed.  Past Medical History:  Diagnosis Date  . Anxiety   . GERD (gastroesophageal reflux disease)    occasional - OTC as needed  . History of chicken pox   . History of kidney stones   . History of shingles   . Hypertension    under control with med., had been on med. since age 57  . Insomnia 09/13/2016  . Insulin dependent diabetes mellitus (HCC)    Follows with endocrinology, Dr Chestine Sporelark reports his last hgba1c was 7.3   . Non-insulin dependent type 2 diabetes mellitus (HCC)   . Obesity 01/30/2017  . Osteoarthritis    right knee  . PONV (postoperative nausea and vomiting)   . PONV (postoperative nausea and vomiting)   . Preventative health care 08/14/2013  . Tachycardia   . Urinary frequency 12/11/2016    Current Outpatient Medications on File Prior to Visit  Medication Sig Dispense Refill  . carvedilol (COREG) 12.5 MG tablet Take 1 tablet (12.5 mg total) by mouth 2 (two) times daily with a meal. 60 tablet 6  . escitalopram (LEXAPRO) 20 MG tablet TAKE 1 TABLET BY MOUTH ONCE DAILY 90 tablet 0  . gabapentin (NEURONTIN) 100 MG capsule Take 1 capsule (100 mg total) by mouth at bedtime. 30 capsule 0  . insulin  aspart (NOVOLOG FLEXPEN) 100 UNIT/ML FlexPen Inject 20-30 units 3 times daily with meals (Patient taking differently: Inject 20-30 Units into the skin 3 (three) times daily with meals. Inject 20-30 units 3 times daily with meals) 81 mL 0  . insulin detemir (LEVEMIR) 100 UNIT/ML injection Inject 0.6 mLs (60 Units total) into the skin at bedtime. 54 mL 3  . lansoprazole (PREVACID) 30 MG capsule Take 30 mg by mouth daily.     Marland Kitchen. lisinopril (PRINIVIL,ZESTRIL) 10 MG tablet Take 1 tablet (10 mg total) by mouth daily. 90 tablet 0  . loratadine (CLARITIN) 10 MG tablet Take 10 mg by mouth daily.    . methocarbamol (ROBAXIN) 500 MG tablet Take 1 tablet (500 mg total) by mouth every 6 (six) hours as needed for muscle spasms. 90 tablet 2  . TRULICITY 1.5 MG/0.5ML SOPN INJECT 1 SYRINGE SUBCUTANEOUSLY ONCE A WEEK (Patient taking differently: INJECT 1 SYRINGE SUBCUTANEOUSLY ONCE A WEEK ON SATURDAYS) 4 pen 1   No current facility-administered medications on file prior to visit.     Past Surgical History:  Procedure Laterality Date  . ANTERIOR CERVICAL DECOMP/DISCECTOMY FUSION N/A 01/16/2018   Procedure: ANTERIOR CERVICAL DECOMPRESSION/DISCECTOMY FUSION CERVICAL FOUR-FIVE, CERVICAL FIVE-SIX;  Surgeon: Lisbeth RenshawNundkumar, Neelesh, MD;  Location: [facility] OR;  Service: Neurosurgery;  Laterality: N/A;  ANTERIOR CERVICAL DECOMPRESSION/DISCECTOMY FUSION CERVICAL FOUR-FIVE, CERVICAL FIVE-SIX  .  CHOLECYSTECTOMY  1980's  . CYSTOSCOPY/RETROGRADE/URETEROSCOPY/STONE EXTRACTION WITH BASKET Right 08/27/2003   and double J stent placement  . INGUINAL HERNIA REPAIR Left 1990's  . KNEE ARTHROSCOPY Left 1980's  . KNEE ARTHROSCOPY Right 2013  . KNEE ARTHROSCOPY Right 06/30/2013   Procedure: RIGHT KNEE ARTHROSCOPY AND SYNOVECTOMY;  Surgeon: Velna Ochs, MD;  Location: [facility];  Service: Orthopedics;  Laterality: Right;  . LITHOTRIPSY     multiple, b/l  . SHOULDER ADHESION RELEASE Left ` 1998  . SHOULDER ARTHROSCOPY W/  ROTATOR CUFF REPAIR Left 10/20/2004   x 4, had to have part of clavile removed. torn rotator cuff multiple times, had to place screws   . SHOULDER ARTHROSCOPY W/ SUPERIOR LABRAL ANTERIOR POSTERIOR LESION REPAIR Left 12/22/2004  . TOTAL KNEE ARTHROPLASTY Right 12/23/2012   x3, 2 arthroscopies and a TKR,  TOTAL KNEE ARTHROPLASTY;  Surgeon: Velna Ochs, MD;  Location: [facility] OR;  Service: Orthopedics;  Laterality: Right;  DEPUY, RNFA  . WRIST SURGERY Right    x 3, torn ligaments, pins placed then infected, then fused with plate    Allergies  Allergen Reactions  . Toradol [Ketorolac Tromethamine] Itching    Social History   Socioeconomic History  . Marital status: Single    Spouse name: Not on file  . Number of children: 2  . Years of education: Not on file  . Highest education level: High school graduate  Occupational History  . Occupation: Disabled  Social Needs  . Financial resource strain: Not on file  . Food insecurity:    Worry: Not on file    Inability: Not on file  . Transportation needs:    Medical: Not on file    Non-medical: Not on file  Tobacco Use  . Smoking status: Never Smoker  . Smokeless tobacco: Current User    Types: Snuff  Substance and Sexual Activity  . Alcohol use: No    Frequency: Never  . Drug use: No  . Sexual activity: Yes  Lifestyle  . Physical activity:    Days per week: Not on file    Minutes per session: Not on file  . Stress: Not on file  Relationships  . Social connections:    Talks on phone: Not on file    Gets together: Not on file    Attends religious service: Not on file    Active member of club or organization: Not on file    Attends meetings of clubs or organizations: Not on file    Relationship status: Not on file  . Intimate partner violence:    Fear of current or ex partner: Not on file    Emotionally abused: Not on file    Physically abused: Not on file    Forced sexual activity: Not on file  Other Topics Concern  . Not  on file  Social History Narrative   Lives at home with his father (he takes care of his father)   Right handed   Drinks 2 cups of caffeine daily    Family History  Problem Relation Age of Onset  . COPD Mother        smoker  . Hypertension Mother   . Heart disease Mother        CHF  . Diabetes Father   . Heart disease Father        quintuple bypass  . Stroke Father   . Hypertension Father   . Peripheral vascular disease Father   .  Hypertension Son        tachycardia  . Heart disease Maternal Grandfather   . Arthritis Paternal Grandmother   . Colon cancer Neg Hx   . Rectal cancer Neg Hx   . Stomach cancer Neg Hx     BP (!) 134/91   Pulse 94   Ht 6' (1.829 m)   Wt 213 lb (96.6 kg)   BMI 28.89 kg/m   Review of Systems: See HPI above.     Objective:  Physical Exam:  Gen: NAD, comfortable in exam room  Left wrist: No deformity, swelling, bruising. FROM with 5/5 strength but pain with all motions of thumb. TTP 1st dorsal compartment.  Minimal tenderness 1st CMC.  No carpal tunnel tenderness. Negative tinels and phalens.   Positive finkelsteins. NVI distally.  Right wrist: No deformity. FROM with 5/5 strength. No tenderness to palpation. NVI distally.   MSK u/s left wrist:  Tenosynovitis of 1st dorsal compartment with neovascularity.    Assessment & Plan:  1. Left de quervain's tenosynovitis -patient has been using a thumb spica brace without much benefit as well as trying anti-inflammatories and steroids.  He was given a first dorsal compartment injection today.  Instructed to continue with thumb spica brace, ice this.  Follow-up in 1 month.  After informed written consent timeout was performed.  Patient was seated in chair in exam room.  Patient's left first dorsal compartment was identified with ultrasound, prepped with alcohol swab, then injected with 0.5:0.90mL bupivacaine: Depo-Medrol.  Patient tolerated procedure well without immediate complications.

## 2018-03-12 ENCOUNTER — Other Ambulatory Visit: Payer: Self-pay | Admitting: Internal Medicine

## 2018-04-01 ENCOUNTER — Other Ambulatory Visit: Payer: Self-pay | Admitting: Internal Medicine

## 2018-04-04 ENCOUNTER — Ambulatory Visit: Payer: Medicare HMO | Admitting: Family Medicine

## 2018-04-17 ENCOUNTER — Telehealth: Payer: Self-pay | Admitting: Endocrinology

## 2018-04-17 NOTE — Telephone Encounter (Signed)
This is Dr. Charlean SanfilippoGherghe's pt--Sorry  Jocelyn calling [facility] # [phone]  They are requesting a call back from us to review the patients medication list.

## 2018-04-17 NOTE — Telephone Encounter (Signed)
Went over all the medications prescribed by us but advised them that the pt is not following up here and for a proper reconciliation they need to contact the PCP office.

## 2018-04-20 ENCOUNTER — Other Ambulatory Visit: Payer: Self-pay | Admitting: Family Medicine

## 2018-04-20 DIAGNOSIS — F419 Anxiety disorder, unspecified: Secondary | ICD-10-CM

## 2018-04-21 ENCOUNTER — Other Ambulatory Visit: Payer: Self-pay

## 2018-04-21 MED ORDER — INSULIN ASPART 100 UNIT/ML FLEXPEN: PEN_INJECTOR | SUBCUTANEOUS | 0 refills | Status: DC

## 2018-06-08 ENCOUNTER — Other Ambulatory Visit: Payer: Self-pay | Admitting: Internal Medicine

## 2018-06-09 ENCOUNTER — Encounter: Payer: Self-pay | Admitting: Family Medicine

## 2018-06-09 NOTE — Telephone Encounter (Signed)
Sean Horn patient  

## 2018-06-09 NOTE — Telephone Encounter (Signed)
Okay to refill for 3 months but he absolutely needs to schedule an appointment within this timeframe.  No further refills.

## 2018-06-09 NOTE — Telephone Encounter (Signed)
Please advise.  Last OV 07/19/17, since then 2 cancels and 1 no show.

## 2018-06-19 ENCOUNTER — Ambulatory Visit: Payer: Medicare HMO | Admitting: Family Medicine

## 2018-06-19 ENCOUNTER — Encounter: Payer: Self-pay | Admitting: Family Medicine

## 2018-06-19 VITALS — BP 139/94 | HR 89 | Ht 72.0 in | Wt 216.0 lb

## 2018-06-19 DIAGNOSIS — M25532 Pain in left wrist: Secondary | ICD-10-CM | POA: Diagnosis not present

## 2018-06-19 DIAGNOSIS — M654 Radial styloid tenosynovitis [de Quervain]: Secondary | ICD-10-CM | POA: Diagnosis not present

## 2018-06-19 MED ORDER — METHYLPREDNISOLONE ACETATE 40 MG/ML IJ SUSP
20.0000 mg | Freq: Once | INTRAMUSCULAR | Status: AC
  Administered 2018-06-19: 20 mg via INTRA_ARTICULAR

## 2018-06-19 NOTE — Patient Instructions (Signed)
We repeated your injection today. We will refer you to Dr. Amanda PeaGramig or Dr. Melvyn Novasrtmann - if you feel better, you can cancel this appointment - otherwise keep this appointment with them to talk about surgical options for de Quervain's. Follow up with me as needed otherwise.

## 2018-06-19 NOTE — Progress Notes (Signed)
PCP: Bradd Canary, MD Consultation requested by Esperanza Richters PA-C  Subjective:   HPI: Sean Horn is a 57 y.o. male here for left wrist pain.  7/30: Sean Horn reports for about 4 months he has had radial sided left wrist pain. He recalls about a year ago he had similar pain went to an urgent care, had an injection which helped. He also sustained a fall in May 2019 causing a FOOSH injury of his left wrist. This felt like it flared the pain on the radial side of his left wrist. He is tried brace, thumb spica, icing, prednisone Dosepak, Biofreeze, Aleve, ibuprofen, Tylenol, Percocet. Do not feel there was any significant benefit with these measures. Pain is worse at night. Pain is 5 out of 10 and sharp especially with motions of the hand and thumb. No skin changes or numbness. Right-handed.  11/14: Sean Horn reports he had 1 week of relief with injection last visit. Pain back to 7/10 level and sharp, worse with motions of thumb and hand. No skin changes or numbness.  Past Medical History:  Diagnosis Date  . Anxiety   . GERD (gastroesophageal reflux disease)    occasional - OTC as needed  . History of chicken pox   . History of kidney stones   . History of shingles   . Hypertension    under control with med., had been on med. since age 6  . Insomnia 09/13/2016  . Insulin dependent diabetes mellitus (HCC)    Follows with endocrinology, Dr Chestine Spore reports his last hgba1c was 7.3   . Non-insulin dependent type 2 diabetes mellitus (HCC)   . Obesity 01/30/2017  . Osteoarthritis    right knee  . PONV (postoperative nausea and vomiting)   . PONV (postoperative nausea and vomiting)   . Preventative health care 08/14/2013  . Tachycardia   . Urinary frequency 12/11/2016    Current Outpatient Medications on File Prior to Visit  Medication Sig Dispense Refill  . carvedilol (COREG) 12.5 MG tablet Take 1 tablet (12.5 mg total) by mouth 2 (two) times daily with a meal. 60 tablet 6  .  Dulaglutide (TRULICITY) 1.5 MG/0.5ML SOPN Must have appointment for further refills 2 pen 0  . escitalopram (LEXAPRO) 20 MG tablet TAKE 1 TABLET BY MOUTH ONCE DAILY 90 tablet 0  . gabapentin (NEURONTIN) 600 MG tablet Take 600 mg by mouth 3 (three) times daily.  11  . insulin aspart (NOVOLOG FLEXPEN) 100 UNIT/ML FlexPen Inject 20-30 units 3 times daily with meals 81 mL 0  . insulin detemir (LEVEMIR) 100 UNIT/ML injection Inject 0.6 mLs (60 Units total) into the skin at bedtime. 54 mL 3  . lansoprazole (PREVACID) 30 MG capsule Take 30 mg by mouth daily.     Marland Kitchen lisinopril (PRINIVIL,ZESTRIL) 10 MG tablet Take 1 tablet (10 mg total) by mouth daily. 90 tablet 0  . loratadine (CLARITIN) 10 MG tablet Take 10 mg by mouth daily.    . metFORMIN (GLUCOPHAGE) 1000 MG tablet TAKE 1 TABLET BY MOUTH TWICE DAILY WITH A MEAL  3  . methocarbamol (ROBAXIN) 500 MG tablet Take 1 tablet (500 mg total) by mouth every 6 (six) hours as needed for muscle spasms. 90 tablet 2   No current facility-administered medications on file prior to visit.     Past Surgical History:  Procedure Laterality Date  . ANTERIOR CERVICAL DECOMP/DISCECTOMY FUSION N/A 01/16/2018   Procedure: ANTERIOR CERVICAL DECOMPRESSION/DISCECTOMY FUSION CERVICAL FOUR-FIVE, CERVICAL FIVE-SIX;  Surgeon: Lisbeth Renshaw, MD;  Location: [facility]  OR;  Service: Neurosurgery;  Laterality: N/A;  ANTERIOR CERVICAL DECOMPRESSION/DISCECTOMY FUSION CERVICAL FOUR-FIVE, CERVICAL FIVE-SIX  . CHOLECYSTECTOMY  1980's  . CYSTOSCOPY/RETROGRADE/URETEROSCOPY/STONE EXTRACTION WITH BASKET Right 08/27/2003   and double J stent placement  . INGUINAL HERNIA REPAIR Left 1990's  . KNEE ARTHROSCOPY Left 1980's  . KNEE ARTHROSCOPY Right 2013  . KNEE ARTHROSCOPY Right 06/30/2013   Procedure: RIGHT KNEE ARTHROSCOPY AND SYNOVECTOMY;  Surgeon: Velna Ochs, MD;  Location: [facility];  Service: Orthopedics;  Laterality: Right;  . LITHOTRIPSY     multiple, b/l  . SHOULDER  ADHESION RELEASE Left ` 1998  . SHOULDER ARTHROSCOPY W/ ROTATOR CUFF REPAIR Left 10/20/2004   x 4, had to have part of clavile removed. torn rotator cuff multiple times, had to place screws   . SHOULDER ARTHROSCOPY W/ SUPERIOR LABRAL ANTERIOR POSTERIOR LESION REPAIR Left 12/22/2004  . TOTAL KNEE ARTHROPLASTY Right 12/23/2012   x3, 2 arthroscopies and a TKR,  TOTAL KNEE ARTHROPLASTY;  Surgeon: Velna Ochs, MD;  Location: [facility] OR;  Service: Orthopedics;  Laterality: Right;  DEPUY, RNFA  . WRIST SURGERY Right    x 3, torn ligaments, pins placed then infected, then fused with plate    Allergies  Allergen Reactions  . Toradol [Ketorolac Tromethamine] Itching    Social History   Socioeconomic History  . Marital status: Single    Spouse name: Not on file  . Number of children: 2  . Years of education: Not on file  . Highest education level: High school graduate  Occupational History  . Occupation: Disabled  Social Needs  . Financial resource strain: Not on file  . Food insecurity:    Worry: Not on file    Inability: Not on file  . Transportation needs:    Medical: Not on file    Non-medical: Not on file  Tobacco Use  . Smoking status: Never Smoker  . Smokeless tobacco: Current User    Types: Snuff  Substance and Sexual Activity  . Alcohol use: No    Frequency: Never  . Drug use: No  . Sexual activity: Yes  Lifestyle  . Physical activity:    Days per week: Not on file    Minutes per session: Not on file  . Stress: Not on file  Relationships  . Social connections:    Talks on phone: Not on file    Gets together: Not on file    Attends religious service: Not on file    Active member of club or organization: Not on file    Attends meetings of clubs or organizations: Not on file    Relationship status: Not on file  . Intimate partner violence:    Fear of current or ex partner: Not on file    Emotionally abused: Not on file    Physically abused: Not on file    Forced  sexual activity: Not on file  Other Topics Concern  . Not on file  Social History Narrative   Lives at home with his father (he takes care of his father)   Right handed   Drinks 2 cups of caffeine daily    Family History  Problem Relation Age of Onset  . COPD Mother        smoker  . Hypertension Mother   . Heart disease Mother        CHF  . Diabetes Father   . Heart disease Father        quintuple bypass  .  Stroke Father   . Hypertension Father   . Peripheral vascular disease Father   . Hypertension Son        tachycardia  . Heart disease Maternal Grandfather   . Arthritis Paternal Grandmother   . Colon cancer Neg Hx   . Rectal cancer Neg Hx   . Stomach cancer Neg Hx     BP (!) 139/94   Pulse 89   Ht 6' (1.829 m)   Wt 216 lb (98 kg)   BMI 29.29 kg/m   Review of Systems: See HPI above.     Objective:  Physical Exam:  Gen: NAD, comfortable in exam room  Left wrist: No deformity, swelling, bruising. FROM with 5/5 strength but pain with flexion and abduction of thumb. TTP 1st dorsal compartment.  No tenderness carpal tunnel, 1st CMC. Positive finkelsteins.   Negative tinels. NVI distally.  MSK u/s left wrist:  Tenosynovitis of 1st dorsal compartment.   Assessment & Plan:  1. Left de quervain's tenosynovitis - unfortunately only transiently relief with injection - discussed options and repeated this today but will also refer to hand surgeon in case this does not resolve with repeat injection.  Thumb spica, icing as needed.  After informed written consent timeout was performed and Sean Horn was seated in chair in exam room.  Left 1st dorsal compartment identified and marked with ultrasound.  Area overlying this prepped with alcohol swab then left 1st dorsal compartment injected with 0.5:0.725mL bupivicaine:depomedrol.  Sean Horn tolerated procedure well without immediate complications.

## 2018-06-23 DIAGNOSIS — R42 Dizziness and giddiness: Secondary | ICD-10-CM | POA: Diagnosis not present

## 2018-06-23 DIAGNOSIS — I10 Essential (primary) hypertension: Secondary | ICD-10-CM | POA: Diagnosis not present

## 2018-06-23 DIAGNOSIS — Z6830 Body mass index (BMI) 30.0-30.9, adult: Secondary | ICD-10-CM | POA: Diagnosis not present

## 2018-06-25 ENCOUNTER — Encounter: Payer: Self-pay | Admitting: Family Medicine

## 2018-06-28 NOTE — Progress Notes (Deleted)
[facility] [place] [address], [address] [place], [place] [address] [phone] [phone]  Date:  06/30/2018   Name:  Sean Horn   DOB:  07/04/1961   MRN:  [id number]  PCP:  Bradd CanaryBlyth, Stacey A, MD    Chief Complaint: No chief complaint on file.   History of Present Illness:  Sean Horn is a 57 y.o. very pleasant male patient who presents with the following:  Pt of Dr. Abner GreenspanBlyth here today with concern of HTN History of IDDM  I last saw him in February of this year He had a neck decompression operation in June of this year He has not seen endocrinology in some time- needs to follow-up with them Also who does he consider to be his PCP Behind on screening- try to catch up today if time  Colon: Foot exam: Eye exam: Hep C screening Pneumonia vaccine Flu vaccine  Lab Results  Component Value Date   HGBA1C 8.9 (H) 01/13/2018    Patient Active Problem List   Diagnosis Date Noted  . Cervical stenosis of spine 01/16/2018  . Hyperlipidemia 07/19/2017  . Obesity 01/30/2017  . Urinary frequency 12/11/2016  . Insomnia 09/13/2016  . History of chicken pox   . History of shingles   . Preventative health care 08/14/2013  . PONV (postoperative nausea and vomiting)   . Anxiety   . History of kidney stones   . GERD (gastroesophageal reflux disease)   . Insulin dependent diabetes mellitus (HCC)   . Hypertension   . Osteoarthritis   . 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 Anxiety   . GERD (gastroesophageal reflux disease)    occasional - OTC as needed  . History of chicken pox   . History of kidney stones   . History of shingles   . Hypertension    under control with med., had been on med. since age 57  . Insomnia 09/13/2016  . Insulin dependent diabetes mellitus (HCC)    Follows with endocrinology, Dr Chestine Sporelark reports his last hgba1c was 7.3   . Non-insulin dependent type 2  diabetes mellitus (HCC)   . Obesity 01/30/2017  . Osteoarthritis    right knee  . PONV (postoperative nausea and vomiting)   . PONV (postoperative nausea and vomiting)   . Preventative health care 08/14/2013  . Tachycardia   . Urinary frequency 12/11/2016    Past Surgical History:  Procedure Laterality Date  . ANTERIOR CERVICAL DECOMP/DISCECTOMY FUSION N/A 01/16/2018   Procedure: ANTERIOR CERVICAL DECOMPRESSION/DISCECTOMY FUSION CERVICAL FOUR-FIVE, CERVICAL FIVE-SIX;  Surgeon: Lisbeth RenshawNundkumar, Neelesh, MD;  Location: [facility] OR;  Service: Neurosurgery;  Laterality: N/A;  ANTERIOR CERVICAL DECOMPRESSION/DISCECTOMY FUSION CERVICAL FOUR-FIVE, CERVICAL FIVE-SIX  . CHOLECYSTECTOMY  1980's  . CYSTOSCOPY/RETROGRADE/URETEROSCOPY/STONE EXTRACTION WITH BASKET Right 08/27/2003   and double J stent placement  . INGUINAL HERNIA REPAIR Left 1990's  . KNEE ARTHROSCOPY Left 1980's  . KNEE ARTHROSCOPY Right 2013  . KNEE ARTHROSCOPY Right 06/30/2013   Procedure: RIGHT KNEE ARTHROSCOPY AND SYNOVECTOMY;  Surgeon: Velna OchsPeter G Dalldorf, MD;  Location: [facility];  Service: Orthopedics;  Laterality: Right;  . LITHOTRIPSY     multiple, b/l  . SHOULDER ADHESION RELEASE Left ` 1998  . SHOULDER ARTHROSCOPY W/ ROTATOR CUFF REPAIR Left 10/20/2004   x 4, had to have part of clavile removed. torn rotator cuff multiple times, had to place screws   .  SHOULDER ARTHROSCOPY W/ SUPERIOR LABRAL ANTERIOR POSTERIOR LESION REPAIR Left 12/22/2004  . TOTAL KNEE ARTHROPLASTY Right 12/23/2012   x3, 2 arthroscopies and a TKR,  TOTAL KNEE ARTHROPLASTY;  Surgeon: Velna Ochs, MD;  Location: [facility] OR;  Service: Orthopedics;  Laterality: Right;  DEPUY, RNFA  . WRIST SURGERY Right    x 3, torn ligaments, pins placed then infected, then fused with plate    Social History   Tobacco Use  . Smoking status: Never Smoker  . Smokeless tobacco: Current User    Types: Snuff  Substance Use Topics  . Alcohol use: No    Frequency: Never  .  Drug use: No    Family History  Problem Relation Age of Onset  . COPD Mother        smoker  . Hypertension Mother   . Heart disease Mother        CHF  . Diabetes Father   . Heart disease Father        quintuple bypass  . Stroke Father   . Hypertension Father   . Peripheral vascular disease Father   . Hypertension Son        tachycardia  . Heart disease Maternal Grandfather   . Arthritis Paternal Grandmother   . Colon cancer Neg Hx   . Rectal cancer Neg Hx   . Stomach cancer Neg Hx     Allergies  Allergen Reactions  . Toradol [Ketorolac Tromethamine] Itching    Medication list has been reviewed and updated.  Current Outpatient Medications on File Prior to Visit  Medication Sig Dispense Refill  . carvedilol (COREG) 12.5 MG tablet Take 1 tablet (12.5 mg total) by mouth 2 (two) times daily with a meal. 60 tablet 6  . Dulaglutide (TRULICITY) 1.5 MG/0.5ML SOPN Must have appointment for further refills 2 pen 0  . escitalopram (LEXAPRO) 20 MG tablet TAKE 1 TABLET BY MOUTH ONCE DAILY 90 tablet 0  . gabapentin (NEURONTIN) 600 MG tablet Take 600 mg by mouth 3 (three) times daily.  11  . insulin aspart (NOVOLOG FLEXPEN) 100 UNIT/ML FlexPen Inject 20-30 units 3 times daily with meals 81 mL 0  . insulin detemir (LEVEMIR) 100 UNIT/ML injection Inject 0.6 mLs (60 Units total) into the skin at bedtime. 54 mL 3  . lansoprazole (PREVACID) 30 MG capsule Take 30 mg by mouth daily.     Marland Kitchen lisinopril (PRINIVIL,ZESTRIL) 10 MG tablet Take 1 tablet (10 mg total) by mouth daily. 90 tablet 0  . loratadine (CLARITIN) 10 MG tablet Take 10 mg by mouth daily.    . metFORMIN (GLUCOPHAGE) 1000 MG tablet TAKE 1 TABLET BY MOUTH TWICE DAILY WITH A MEAL  3  . methocarbamol (ROBAXIN) 500 MG tablet Take 1 tablet (500 mg total) by mouth every 6 (six) hours as needed for muscle spasms. 90 tablet 2   No current facility-administered medications on file prior to visit.     Review of Systems:  As per HPI-  otherwise negative.   Physical Examination: There were no vitals filed for this visit. There were no vitals filed for this visit. There is no height or weight on file to calculate BMI. Ideal Body Weight:    GEN: WDWN, NAD, Non-toxic, A & O x 3 HEENT: Atraumatic, Normocephalic. Neck supple. No masses, No LAD. Ears and Nose: No external deformity. CV: RRR, No M/G/R. No JVD. No thrill. No extra heart sounds. PULM: CTA B, no wheezes, crackles, rhonchi. No retractions. No resp. distress. No  accessory muscle use. ABD: S, NT, ND, +BS. No rebound. No HSM. EXTR: No c/c/e NEURO Normal gait.  PSYCH: Normally interactive. Conversant. Not depressed or anxious appearing.  Calm demeanor.    Assessment and Plan: ***  Signed Lamar Blinks, MD

## 2018-06-30 ENCOUNTER — Encounter: Payer: Self-pay | Admitting: Family Medicine

## 2018-06-30 ENCOUNTER — Ambulatory Visit: Payer: Medicare HMO | Admitting: Family Medicine

## 2018-06-30 DIAGNOSIS — Z0289 Encounter for other administrative examinations: Secondary | ICD-10-CM

## 2018-07-01 ENCOUNTER — Encounter: Payer: Self-pay | Admitting: Internal Medicine

## 2018-07-01 ENCOUNTER — Ambulatory Visit: Payer: Medicare HMO | Admitting: Internal Medicine

## 2018-07-01 VITALS — BP 148/90 | HR 95 | Ht 72.0 in | Wt 227.0 lb

## 2018-07-01 DIAGNOSIS — E785 Hyperlipidemia, unspecified: Secondary | ICD-10-CM | POA: Diagnosis not present

## 2018-07-01 DIAGNOSIS — E119 Type 2 diabetes mellitus without complications: Secondary | ICD-10-CM

## 2018-07-01 DIAGNOSIS — Z794 Long term (current) use of insulin: Secondary | ICD-10-CM

## 2018-07-01 DIAGNOSIS — E669 Obesity, unspecified: Secondary | ICD-10-CM

## 2018-07-01 DIAGNOSIS — IMO0001 Reserved for inherently not codable concepts without codable children: Secondary | ICD-10-CM

## 2018-07-01 LAB — POCT GLYCOSYLATED HEMOGLOBIN (HGB A1C): Hemoglobin A1C: 12.2 % — AB (ref 4.0–5.6)

## 2018-07-01 MED ORDER — INSULIN DETEMIR 100 UNIT/ML ~~LOC~~ SOLN: 75.0000 [IU] | Freq: Every day | SUBCUTANEOUS | 5 refills | Status: DC

## 2018-07-01 MED ORDER — INSULIN ASPART 100 UNIT/ML FLEXPEN: PEN_INJECTOR | SUBCUTANEOUS | 5 refills | Status: DC

## 2018-07-01 MED ORDER — METFORMIN HCL ER 500 MG PO TB24: 500.0000 mg | ORAL_TABLET | Freq: Two times a day (BID) | ORAL | 11 refills | Status: DC

## 2018-07-01 MED ORDER — DULAGLUTIDE 1.5 MG/0.5ML ~~LOC~~ SOAJ: SUBCUTANEOUS | 3 refills | Status: DC

## 2018-07-01 NOTE — Progress Notes (Signed)
Patient ID: Sean Horn, male   DOB: [birth year], 57 y.o.   MRN: [id number]   HPI: Sean Horn is a 57 y.o.-year-old male, returning for follow-up for DM2, dx in 2013, insulin-dependent since 2017, uncontrolled, without long-term complications. He was previously seen by Dr. Chestine Spore.  Last visit 1 year ago.  He had back surgery in June.  Taking Glucerna after dinner daily!  He was on Prednisone for wrist pain 3 mo ago.  Sugars were higher and they remain elevated.  Since last visit, he developed diarrhea with metformin and stopped the medication.  Last hemoglobin A1c was: Lab Results  Component Value Date   HGBA1C 8.9 (H) 01/13/2018   HGBA1C 13.7 05/27/2017   HGBA1C 9.8 (H) 12/11/2016   At last visit, we changed to: -  >> diarrhea  - stopped 2 mo ago - Trulicity 1.5 mg weekly - started 07/2017 - off for 3 mo, restarted it 1 mo ago - Levemir 60 units at bedtime  - Humalog - misses lunchtime insulin frequently: 20 units for a smaller meal 25 units for a regular meal   Pt checks his sugars 1x a day: - am: 160-300s, 400 >> 298-400 >> 180-330 - 2h after b'fast: n/c >> 336 >> n/c  - before lunch: 80 x1  >> >600 >> n/c - 2h after lunch: n/c - before dinner: n/c - 2h after dinner: n/c - bedtime: 200-300s >> n/c - nighttime: n/c Lowest sugar was 80 >> 200s >> 180; he has hypoglycemia awareness in the 80s Highest sugar was 400s >> HI >> 350.  Glucometer: One Touch Ultra  Pt's meals are: - Breakfast: cereal: cheerios + 2% milk - Lunch: sandwich  - Dinner: meat + veggies and less starches - Snacks: after dinner - nuts; granola; sugar-free cookies, Glucerna! Midnight snack: e.g. banana   -No CKD, last BUN/creatinine:  Lab Results  Component Value Date   BUN 8 01/13/2018   BUN 10 08/28/2017   CREATININE 0.80 01/13/2018   CREATININE 0.71 08/28/2017  On lisinopril 10. -+ HL; last set of lipids: Lab Results  Component Value Date   CHOL 200 12/11/2016   HDL 39.50  12/11/2016   LDLDIRECT 128.0 12/11/2016   TRIG 269.0 (H) 12/11/2016   CHOLHDL 5 12/11/2016   On Crestor 10-started 12/2016. - last eye exam was in 10/2016: No DR - no numbness and tingling in his feet.  He has family history of diabetes in father.  ROS: Constitutional: no weight gain/no weight loss, + fatigue, no subjective hyperthermia, no subjective hypothermia Eyes: no blurry vision, no xerophthalmia ENT: no sore throat, no nodules palpated in neck, no dysphagia, no odynophagia, no hoarseness Cardiovascular: no CP/no SOB/no palpitations/no leg swelling Respiratory: no cough/no SOB/no wheezing Gastrointestinal: no N/no V/no D/no C/no acid reflux Musculoskeletal: no muscle aches/no joint aches Skin: no rashes, no hair loss Neurological: no tremors/no numbness/no tingling/no dizziness, + headache + Difficulty with erections  I reviewed pt's medications, allergies, PMH, social hx, family hx, and changes were documented in the history of present illness. Otherwise, unchanged from my initial visit note.  Past Medical History:  Diagnosis Date  . Anxiety   . GERD (gastroesophageal reflux disease)    occasional - OTC as needed  . History of chicken pox   . History of kidney stones   . History of shingles   . Hypertension    under control with med., had been on med. since age 36  . Insomnia 09/13/2016  . Insulin  dependent diabetes mellitus (HCC)    Follows with endocrinology, Dr Chestine Spore reports his last hgba1c was 7.3   . Non-insulin dependent type 2 diabetes mellitus (HCC)   . Obesity 01/30/2017  . Osteoarthritis    right knee  . PONV (postoperative nausea and vomiting)   . PONV (postoperative nausea and vomiting)   . Preventative health care 08/14/2013  . Tachycardia   . Urinary frequency 12/11/2016   Past Surgical History:  Procedure Laterality Date  . ANTERIOR CERVICAL DECOMP/DISCECTOMY FUSION N/A 01/16/2018   Procedure: ANTERIOR CERVICAL DECOMPRESSION/DISCECTOMY FUSION CERVICAL  FOUR-FIVE, CERVICAL FIVE-SIX;  Surgeon: Lisbeth Renshaw, MD;  Location: [facility] OR;  Service: Neurosurgery;  Laterality: N/A;  ANTERIOR CERVICAL DECOMPRESSION/DISCECTOMY FUSION CERVICAL FOUR-FIVE, CERVICAL FIVE-SIX  . CHOLECYSTECTOMY  1980's  . CYSTOSCOPY/RETROGRADE/URETEROSCOPY/STONE EXTRACTION WITH BASKET Right 08/27/2003   and double J stent placement  . INGUINAL HERNIA REPAIR Left 1990's  . KNEE ARTHROSCOPY Left 1980's  . KNEE ARTHROSCOPY Right 2013  . KNEE ARTHROSCOPY Right 06/30/2013   Procedure: RIGHT KNEE ARTHROSCOPY AND SYNOVECTOMY;  Surgeon: Velna Ochs, MD;  Location: [facility];  Service: Orthopedics;  Laterality: Right;  . LITHOTRIPSY     multiple, b/l  . SHOULDER ADHESION RELEASE Left ` 1998  . SHOULDER ARTHROSCOPY W/ ROTATOR CUFF REPAIR Left 10/20/2004   x 4, had to have part of clavile removed. torn rotator cuff multiple times, had to place screws   . SHOULDER ARTHROSCOPY W/ SUPERIOR LABRAL ANTERIOR POSTERIOR LESION REPAIR Left 12/22/2004  . TOTAL KNEE ARTHROPLASTY Right 12/23/2012   x3, 2 arthroscopies and a TKR,  TOTAL KNEE ARTHROPLASTY;  Surgeon: Velna Ochs, MD;  Location: [facility] OR;  Service: Orthopedics;  Laterality: Right;  DEPUY, RNFA  . WRIST SURGERY Right    x 3, torn ligaments, pins placed then infected, then fused with plate   Social History   Social History  . Marital status: Single    Spouse name: N/A  . Number of children: 2   Occupational History  . Retired, disability after his R knee sx.   Social History Main Topics  . Smoking status: Never Smoker  . Smokeless tobacco: Current User    Types: Snuff  . Alcohol use Yes     Comment: occasionally  . Drug use: No  . Sexual activity: Yes     Comment: currently applying for disability due to knee, lives with girfriend, no dietary restrictions, previously Games developer   Current Outpatient Medications on File Prior to Visit  Medication Sig Dispense Refill  . carvedilol (COREG) 12.5  MG tablet Take 1 tablet (12.5 mg total) by mouth 2 (two) times daily with a meal. 60 tablet 6  . Dulaglutide (TRULICITY) 1.5 MG/0.5ML SOPN Must have appointment for further refills 2 pen 0  . escitalopram (LEXAPRO) 20 MG tablet TAKE 1 TABLET BY MOUTH ONCE DAILY 90 tablet 0  . gabapentin (NEURONTIN) 600 MG tablet Take 600 mg by mouth 3 (three) times daily.  11  . insulin aspart (NOVOLOG FLEXPEN) 100 UNIT/ML FlexPen Inject 20-30 units 3 times daily with meals 81 mL 0  . insulin detemir (LEVEMIR) 100 UNIT/ML injection Inject 0.6 mLs (60 Units total) into the skin at bedtime. 54 mL 3  . lansoprazole (PREVACID) 30 MG capsule Take 30 mg by mouth daily.     Marland Kitchen lisinopril (PRINIVIL,ZESTRIL) 10 MG tablet Take 1 tablet (10 mg total) by mouth daily. 90 tablet 0  . loratadine (CLARITIN) 10 MG tablet Take 10 mg by mouth daily.    Marland Kitchen  metFORMIN (GLUCOPHAGE) 1000 MG tablet TAKE 1 TABLET BY MOUTH TWICE DAILY WITH A MEAL  3  . methocarbamol (ROBAXIN) 500 MG tablet Take 1 tablet (500 mg total) by mouth every 6 (six) hours as needed for muscle spasms. 90 tablet 2   No current facility-administered medications on file prior to visit.    Allergies  Allergen Reactions  . Toradol [Ketorolac Tromethamine] Itching   Family History  Problem Relation Age of Onset  . COPD Mother        smoker  . Hypertension Mother   . Heart disease Mother        CHF  . Diabetes Father   . Heart disease Father        quintuple bypass  . Stroke Father   . Hypertension Father   . Peripheral vascular disease Father   . Hypertension Son        tachycardia  . Heart disease Maternal Grandfather   . Arthritis Paternal Grandmother   . Colon cancer Neg Hx   . Rectal cancer Neg Hx   . Stomach cancer Neg Hx    PE: BP (!) 148/90   Pulse 95   Ht 6' (1.829 m) Comment: measured  Wt 227 lb (103 kg)   SpO2 98%   BMI 30.79 kg/m  Wt Readings from Last 3 Encounters:  07/01/18 227 lb (103 kg)  06/19/18 216 lb (98 kg)  03/04/18 213 lb  (96.6 kg)   Constitutional: overweight, in NAD Eyes: PERRLA, EOMI, no exophthalmos ENT: moist mucous membranes, no thyromegaly, no cervical lymphadenopathy Cardiovascular: tachycardia, RR, No MRG Respiratory: CTA B Gastrointestinal: abdomen soft, NT, ND, BS+ Musculoskeletal: no deformities, strength intact in all 4 Skin: moist, warm, no rashes Neurological: no tremor with outstretched hands, DTR normal in all 4  ASSESSMENT: 1. DM2, insulin-dependent, uncontrolled, without long term complications, but with significant hyperglycemia  2. HL  3.  Obesity  PLAN:  1. Patient with history of uncontrolled diabetes, currently on metformin, GLP-1 receptor agonist (started at last visit) and basal-bolus insulin regimen.  We increased his Humalog doses at last visit as his sugars were still very high.  He usually has very fluctuating CBGs, so he likely has a degree of pancreatic insufficiency. -Of note, he cannot tolerate SGLT2 inhibitors due to frequent urination. -Before last visit, his HbA1c was very high, 13.7%.  In 01/2018, this decreased to 8.9%. -At this visit, sugars are very high now off metformin (due to diarrhea).  We discussed about restarting metformin in the extended release formulation, at the lower dose, and he agrees to try this.  Upon questioning, he is drinking Glucerna after dinner every night.  I advised him that this is a meal replacement and not a snack.  He is not covering this with insulin.  I strongly advised him to stop right away.  This may be the reason for his high sugars through the night and in the morning. -At this visit we will also increase his Levemir and Humalog and we discussed about the potential use of U500 insulin in the near future, if needed.  I hope his insurance will cover this.  We will also continue Trulicity, of which she was off but restarted a month ago.  He is tolerating this well. -He is only checking sugars in the morning and I advised him to also  check at bedtime at least, ideally also before every meal. - I suggested to:  Patient Instructions  Please start: - Metformin ER 500  mg 2x a day  Continue: - Trulicity 1.5 mg weekly in am  Please increase: - Levemir 75 units at bedtime - Humalog: 25 units for a smaller meal 30 units for a regular meal 35 units for a larger meal  Please stop Glucerna!  Please return in 3 months with your sugar log.   - today, HbA1c is 12.2% (higher) - continue checking sugars at different times of the day - check 3x a day, rotating checks - advised for yearly eye exams >> he is not UTD - Return to clinic in 3 mo with sugar log   2. HL  - Reviewed latest lipid panel from 1.5 years ago: Triglycerides and LDL both high.  He was started on Crestor then Lab Results  Component Value Date   CHOL 200 12/11/2016   HDL 39.50 12/11/2016   LDLDIRECT 128.0 12/11/2016   TRIG 269.0 (H) 12/11/2016   CHOLHDL 5 12/11/2016  - Continues Crestor without side effects. - He is due for another lipid panel.  He has an appointment with PCP next month.  3.  Obesity -Continue Trulicity which should also help with weight loss -Weight slightly higher than last visit, will greatly increase in the last month -We will stop Glucerna, as mentioned above.  Carlus Pavlov, MD PhD [facility]

## 2018-07-01 NOTE — Addendum Note (Signed)
Addended by: Darliss RidgelONGER, Dace Denn I on: 07/01/2018 02:51 PM   Modules accepted: Orders

## 2018-07-01 NOTE — Patient Instructions (Addendum)
Please start: - Metformin ER 500 mg 2x a day  Continue: - Trulicity 1.5 mg weekly in am  Please increase: - Levemir 75 units at bedtime - Humalog: 25 units for a smaller meal 30 units for a regular meal 35 units for a larger meal  Please stop Glucerna!  Please return in 3 months with your sugar log.

## 2018-07-08 ENCOUNTER — Other Ambulatory Visit: Payer: Self-pay | Admitting: Family Medicine

## 2018-07-08 DIAGNOSIS — F419 Anxiety disorder, unspecified: Secondary | ICD-10-CM

## 2018-07-28 DIAGNOSIS — M654 Radial styloid tenosynovitis [de Quervain]: Secondary | ICD-10-CM | POA: Diagnosis not present

## 2018-08-27 DIAGNOSIS — M654 Radial styloid tenosynovitis [de Quervain]: Secondary | ICD-10-CM | POA: Diagnosis not present

## 2018-08-27 DIAGNOSIS — M659 Synovitis and tenosynovitis, unspecified: Secondary | ICD-10-CM | POA: Diagnosis not present

## 2018-09-09 DIAGNOSIS — M654 Radial styloid tenosynovitis [de Quervain]: Secondary | ICD-10-CM | POA: Diagnosis not present

## 2018-09-09 DIAGNOSIS — Z4789 Encounter for other orthopedic aftercare: Secondary | ICD-10-CM | POA: Diagnosis not present

## 2018-09-16 ENCOUNTER — Other Ambulatory Visit: Payer: Self-pay | Admitting: Family Medicine

## 2018-09-16 DIAGNOSIS — F419 Anxiety disorder, unspecified: Secondary | ICD-10-CM

## 2018-10-12 ENCOUNTER — Encounter: Payer: Self-pay | Admitting: Internal Medicine

## 2018-10-14 ENCOUNTER — Ambulatory Visit: Payer: Medicare HMO | Admitting: Internal Medicine

## 2018-11-03 ENCOUNTER — Encounter (HOSPITAL_COMMUNITY): Payer: Self-pay | Admitting: Emergency Medicine

## 2018-11-03 ENCOUNTER — Other Ambulatory Visit: Payer: Self-pay

## 2018-11-03 ENCOUNTER — Emergency Department (HOSPITAL_COMMUNITY): Payer: Medicare HMO

## 2018-11-03 ENCOUNTER — Observation Stay (HOSPITAL_COMMUNITY)
Admission: EM | Admit: 2018-11-03 | Discharge: 2018-11-04 | Disposition: A | Discharge status: Home / Self Care | Payer: Medicare HMO | Attending: Internal Medicine | Admitting: Internal Medicine

## 2018-11-03 DIAGNOSIS — G459 Transient cerebral ischemic attack, unspecified: Principal | ICD-10-CM | POA: Diagnosis present

## 2018-11-03 DIAGNOSIS — F419 Anxiety disorder, unspecified: Secondary | ICD-10-CM | POA: Diagnosis not present

## 2018-11-03 DIAGNOSIS — I10 Essential (primary) hypertension: Secondary | ICD-10-CM | POA: Diagnosis not present

## 2018-11-03 DIAGNOSIS — F329 Major depressive disorder, single episode, unspecified: Secondary | ICD-10-CM | POA: Insufficient documentation

## 2018-11-03 DIAGNOSIS — R9431 Abnormal electrocardiogram [ECG] [EKG]: Secondary | ICD-10-CM | POA: Diagnosis not present

## 2018-11-03 DIAGNOSIS — E119 Type 2 diabetes mellitus without complications: Secondary | ICD-10-CM | POA: Diagnosis not present

## 2018-11-03 DIAGNOSIS — Z96651 Presence of right artificial knee joint: Secondary | ICD-10-CM | POA: Diagnosis not present

## 2018-11-03 DIAGNOSIS — R202 Paresthesia of skin: Secondary | ICD-10-CM | POA: Diagnosis not present

## 2018-11-03 DIAGNOSIS — Z9049 Acquired absence of other specified parts of digestive tract: Secondary | ICD-10-CM | POA: Diagnosis not present

## 2018-11-03 DIAGNOSIS — Z794 Long term (current) use of insulin: Secondary | ICD-10-CM | POA: Diagnosis not present

## 2018-11-03 DIAGNOSIS — Z79899 Other long term (current) drug therapy: Secondary | ICD-10-CM | POA: Insufficient documentation

## 2018-11-03 DIAGNOSIS — R200 Anesthesia of skin: Secondary | ICD-10-CM | POA: Diagnosis not present

## 2018-11-03 DIAGNOSIS — R69 Illness, unspecified: Secondary | ICD-10-CM | POA: Diagnosis not present

## 2018-11-03 HISTORY — DX: Transient cerebral ischemic attack, unspecified: G45.9

## 2018-11-03 HISTORY — DX: Headache, unspecified: R51.9

## 2018-11-03 HISTORY — DX: Other chronic pain: G89.29

## 2018-11-03 HISTORY — DX: Occipital neuralgia: M54.81

## 2018-11-03 HISTORY — DX: Cervicalgia: M54.2

## 2018-11-03 HISTORY — DX: Headache: R51

## 2018-11-03 LAB — COMPREHENSIVE METABOLIC PANEL
ALT: 29 U/L (ref 0–44)
ANION GAP: 11 (ref 5–15)
AST: 17 U/L (ref 15–41)
Albumin: 4 g/dL (ref 3.5–5.0)
Alkaline Phosphatase: 74 U/L (ref 38–126)
BUN: 13 mg/dL (ref 6–20)
CALCIUM: 8.9 mg/dL (ref 8.9–10.3)
CO2: 24 mmol/L (ref 22–32)
Chloride: 98 mmol/L (ref 98–111)
Creatinine, Ser: 0.86 mg/dL (ref 0.61–1.24)
GFR calc Af Amer: >60 mL/min (ref >60)
GFR calc non Af Amer: >60 mL/min (ref >60)
Glucose, Bld: 377 mg/dL — ABNORMAL HIGH (ref 70–99)
Potassium: 4.1 mmol/L (ref 3.5–5.1)
Sodium: 133 mmol/L — ABNORMAL LOW (ref 135–145)
Total Bilirubin: 1.5 mg/dL — ABNORMAL HIGH (ref 0.3–1.2)
Total Protein: 7 g/dL (ref 6.5–8.1)

## 2018-11-03 LAB — CBC
HCT: 46.4 % (ref 39.0–52.0)
Hemoglobin: 16.5 g/dL (ref 13.0–17.0)
MCH: 30.8 pg (ref 26.0–34.0)
MCHC: 35.6 g/dL (ref 30.0–36.0)
MCV: 86.6 fL (ref 80.0–100.0)
Platelets: 257 10*3/uL (ref 150–400)
RBC: 5.36 MIL/uL (ref 4.22–5.81)
RDW: 12.4 % (ref 11.5–15.5)
WBC: 7.8 10*3/uL (ref 4.0–10.5)
nRBC: 0 % (ref 0.0–0.2)

## 2018-11-03 LAB — RAPID URINE DRUG SCREEN, HOSP PERFORMED
Amphetamines: NOT DETECTED
BARBITURATES: NOT DETECTED
Benzodiazepines: NOT DETECTED
Cocaine: NOT DETECTED
Opiates: NOT DETECTED
Tetrahydrocannabinol: NOT DETECTED

## 2018-11-03 LAB — URINALYSIS, ROUTINE W REFLEX MICROSCOPIC
Bacteria, UA: NONE SEEN
Bilirubin Urine: NEGATIVE
Glucose, UA: >=500 mg/dL — AB
Hgb urine dipstick: NEGATIVE
KETONES UR: 5 mg/dL — AB
Leukocytes,Ua: NEGATIVE
Nitrite: NEGATIVE
PH: 5 (ref 5.0–8.0)
Protein, ur: 30 mg/dL — AB
Specific Gravity, Urine: 1.033 — ABNORMAL HIGH (ref 1.005–1.030)

## 2018-11-03 LAB — DIFFERENTIAL
Abs Immature Granulocytes: 0.04 10*3/uL (ref 0.00–0.07)
BASOS ABS: 0.1 10*3/uL (ref 0.0–0.1)
Basophils Relative: 1 %
EOS PCT: 2 %
Eosinophils Absolute: 0.1 10*3/uL (ref 0.0–0.5)
Immature Granulocytes: 1 %
Lymphocytes Relative: 39 %
Lymphs Abs: 3 10*3/uL (ref 0.7–4.0)
Monocytes Absolute: 0.7 10*3/uL (ref 0.1–1.0)
Monocytes Relative: 9 %
Neutro Abs: 3.8 10*3/uL (ref 1.7–7.7)
Neutrophils Relative %: 48 %

## 2018-11-03 LAB — TROPONIN I: Troponin I: <0.03 ng/mL (ref <0.03)

## 2018-11-03 LAB — APTT: aPTT: 28 seconds (ref 24–36)

## 2018-11-03 LAB — PROTIME-INR
INR: 0.9 (ref 0.8–1.2)
Prothrombin Time: 12.3 seconds (ref 11.4–15.2)

## 2018-11-03 LAB — CBG MONITORING, ED: Glucose-Capillary: 289 mg/dL — ABNORMAL HIGH (ref 70–99)

## 2018-11-03 LAB — ETHANOL: Alcohol, Ethyl (B): <10 mg/dL (ref <10)

## 2018-11-03 MED ORDER — ALBUTEROL SULFATE (2.5 MG/3ML) 0.083% IN NEBU: 2.5000 mg | INHALATION_SOLUTION | RESPIRATORY_TRACT | Status: DC | PRN

## 2018-11-03 MED ORDER — DOCUSATE SODIUM 100 MG PO CAPS
100.0000 mg | ORAL_CAPSULE | Freq: Two times a day (BID) | ORAL | Status: DC
  Administered 2018-11-04: 100 mg via ORAL
  Filled 2018-11-03 (×2): qty 1

## 2018-11-03 MED ORDER — ACETAMINOPHEN 325 MG PO TABS
650.0000 mg | ORAL_TABLET | Freq: Four times a day (QID) | ORAL | Status: DC | PRN
  Administered 2018-11-04: 650 mg via ORAL
  Filled 2018-11-03: qty 2

## 2018-11-03 MED ORDER — ALBUTEROL SULFATE (2.5 MG/3ML) 0.083% IN NEBU: 2.5000 mg | INHALATION_SOLUTION | Freq: Four times a day (QID) | RESPIRATORY_TRACT | Status: DC | PRN

## 2018-11-03 MED ORDER — SENNOSIDES-DOCUSATE SODIUM 8.6-50 MG PO TABS
1.0000 | ORAL_TABLET | Freq: Every evening | ORAL | Status: DC | PRN
  Filled 2018-11-03: qty 1

## 2018-11-03 MED ORDER — MAGNESIUM CITRATE PO SOLN: 1.0000 | Freq: Once | ORAL | Status: DC | PRN

## 2018-11-03 MED ORDER — SORBITOL 70 % SOLN
30.0000 mL | Freq: Every day | Status: DC | PRN
  Filled 2018-11-03: qty 30

## 2018-11-03 MED ORDER — ONDANSETRON HCL 4 MG/2ML IJ SOLN: 4.0000 mg | Freq: Four times a day (QID) | INTRAMUSCULAR | Status: DC | PRN

## 2018-11-03 MED ORDER — ONDANSETRON HCL 4 MG PO TABS: 4.0000 mg | ORAL_TABLET | Freq: Four times a day (QID) | ORAL | Status: DC | PRN

## 2018-11-03 MED ORDER — INSULIN ASPART 100 UNIT/ML ~~LOC~~ SOLN
0.0000 [IU] | Freq: Three times a day (TID) | SUBCUTANEOUS | Status: DC
  Administered 2018-11-04: 5 [IU] via SUBCUTANEOUS
  Administered 2018-11-04: 3 [IU] via SUBCUTANEOUS

## 2018-11-03 MED ORDER — ACETAMINOPHEN 650 MG RE SUPP: 650.0000 mg | Freq: Four times a day (QID) | RECTAL | Status: DC | PRN

## 2018-11-03 MED ORDER — SODIUM CHLORIDE 0.9 % IV SOLN
INTRAVENOUS | Status: DC
  Administered 2018-11-04: via INTRAVENOUS

## 2018-11-03 MED ORDER — HEPARIN SODIUM (PORCINE) 5000 UNIT/ML IJ SOLN
5000.0000 [IU] | Freq: Three times a day (TID) | INTRAMUSCULAR | Status: DC
  Administered 2018-11-04 (×2): 5000 [IU] via SUBCUTANEOUS
  Filled 2018-11-03 (×2): qty 1

## 2018-11-03 MED ORDER — ASPIRIN EC 81 MG PO TBEC
81.0000 mg | DELAYED_RELEASE_TABLET | Freq: Every day | ORAL | Status: DC
  Administered 2018-11-04: 81 mg via ORAL
  Filled 2018-11-03: qty 1

## 2018-11-03 MED ORDER — HYDROCODONE-ACETAMINOPHEN 5-325 MG PO TABS: 1.0000 | ORAL_TABLET | ORAL | Status: DC | PRN

## 2018-11-03 NOTE — ED Notes (Addendum)
Patient states he had episode of bilateral arm and hand numbness last night that resolved. States this morning he was riding to [place] at approximately 1030 and had episode of numbness to bilateral arms and inability to speak and headache. States it lasted approximately 15 minutes and everything resolved except headache. No deficits at this time.

## 2018-11-03 NOTE — Consult Note (Signed)
[facility] TeleNeurology Consult Services   Date of Service:   11/03/2018 23:05:40  Impression:     .  Transient Ischemic Attack  Comments/Sign-Out: 58 YO M with h/o DM, HTN, HLD presented with a transient right sided numbness and difficulty getting his words out for 15 mins now resolved, likely recurrent TIAs r/o stroke  Metrics: Last Known Well: Unknown [facility] Notification Time: 11/03/2018 23:05:40 Arrival Time: 11/03/2018 20:45:00 Stamp Time: 11/03/2018 23:05:40 Time First Login Attempt: 11/03/2018 23:14:00 Video Start Time: 11/03/2018 23:14:00  Symptoms: Right sided numbness and aphasia NIHSS Start Assessment Time: 11/03/2018 23:21:05 Patient is not a candidate for tPA. Patient was not deemed candidate for tPA thrombolytics because of Last Well Known Above 4.5 Hours. Video End Time: 11/03/2018 23:25:33  CT head was reviewed and results were: 1. No acute intracranial hemorrhage. 2. No acute/traumatic cervical spine pathology. 3. C4-C6 disc spacer and ACDF.  Clinical Presentation is not Suggestive of Large Vessel Occlusive Disease, Patient is not a Candidate for Thrombectomy  ED Physician notified of diagnostic impression and management plan on 11/03/2018 23:25:36  Our recommendations are outlined below.  Recommendations:     .  Activate Stroke Protocol Admission/Order Set     .  Stroke/Telemetry Floor     .  Neuro Checks     .  Bedside Swallow Eval     .  DVT Prophylaxis     .  IV Fluids, Normal Saline     .  Head of Bed Below 30 Degrees     .  Euglycemia and Avoid Hyperthermia (PRN Acetaminophen)     .  Initiate Aspirin 81 MG Daily  Routine Consultation with Inhouse Neurology for Follow up Care  Sign Out:     .  Discussed with Emergency Department Provider    ------------------------------------------------------------------------------  History of Present Illness: Patient is a 58 year old Male.  Patient was brought by private  transportation with symptoms of Right sided numbness and aphasia  58 YO M with h/o DM, HTN, HLD presented with a transient right sided numbness and difficulty getting his words out. Symptoms lasted for about 15 mins and were resolved. He had an episode of left sided numbness for 15 mins yesterday, Now he is back to normal self.  CT head was reviewed.    Examination: 1A: Level of Consciousness - Alert; keenly responsive + 0 1B: Ask Month and Age - Both Questions Right + 0 1C: Blink Eyes & Squeeze Hands - Performs Both Tasks + 0 2: Test Horizontal Extraocular Movements - Normal + 0 3: Test Visual Fields - No Visual Loss + 0 4: Test Facial Palsy (Use Grimace if Obtunded) - Normal symmetry + 0 5A: Test Left Arm Motor Drift - No Drift for 10 Seconds + 0 5B: Test Right Arm Motor Drift - No Drift for 10 Seconds + 0 6A: Test Left Leg Motor Drift - No Drift for 5 Seconds + 0 6B: Test Right Leg Motor Drift - No Drift for 5 Seconds + 0 7: Test Limb Ataxia (FNF/Heel-Shin) - No Ataxia + 0 8: Test Sensation - Normal; No sensory loss + 0 9: Test Language/Aphasia - Normal; No aphasia + 0 10: Test Dysarthria - Normal + 0 11: Test Extinction/Inattention - No abnormality + 0  NIHSS Score: 0  Patient was informed the Neurology Consult would happen via TeleHealth consult by way of interactive audio and video telecommunications and consented to receiving care in this manner.  Due to the immediate potential  for life-threatening deterioration due to underlying acute neurologic illness, I spent 35 minutes providing critical care. This time includes time for face to face visit via telemedicine, review of medical records, imaging studies and discussion of findings with providers, the patient and/or family.   Dr Venia Carbon   [facility] [phone]  Case [id number]

## 2018-11-03 NOTE — ED Notes (Signed)
Tele neurology in room  

## 2018-11-03 NOTE — ED Provider Notes (Signed)
[facility] EMERGENCY DEPARTMENT Provider Note   CSN: [id number] Arrival date & time: 11/03/18  2045    History   Chief Complaint Chief Complaint  Patient presents with   Numbness    HPI Sean Horn is a 58 y.o. male.     HPI  Pt was seen at 2110. Per pt, c/o sudden onset and resolution of 2 separate episodes of "numbness" since yesterday. Pt states he was laying in bed yesterday approximately 5/6pm when he felt his left side "become numb." Pt states this numbness involved his left lower face, entire LUE and entire LLE. Pt states he could not pick up his left arm "without it dropping." Pt states he did not try to lift his leg up.  Pt states this lasted approximately 15 minutes before completely resolving. Pt states today at 1030/11am while sitting in a car, he developed right sided "numbness." This numbness involved his right lower face and entire RUE. Pt states this episode also involved him "being unable to get my words out." States these symptoms lasted for approximately 20 minutes before completely resolving. Pt endorses hx of chronic headaches for many years, and also has had intermittent headaches for for the past 2 days. Describes the headache as per his usual chronic headache pain pattern.  Denies headache was sudden or maximal in onset or at any time. Denies any change in his usual chronic neck pain.  Denies visual changes, no fevers, no rash, no injury, no CP/SOB, no abd pain, no N/V/D.     Past Medical History:  Diagnosis Date   Anxiety    Chronic headaches    Chronic neck pain    GERD (gastroesophageal reflux disease)    occasional - OTC as needed   History of chicken pox    History of kidney stones    History of shingles    Hypertension    under control with med., had been on med. since age 56   Insomnia 09/13/2016   Insulin dependent diabetes mellitus (HCC)    Follows with endocrinology, Dr Chestine Spore reports his last hgba1c was 7.3    Non-insulin  dependent type 2 diabetes mellitus (HCC)    Obesity 01/30/2017   Occipital neuralgia    Osteoarthritis    right knee   PONV (postoperative nausea and vomiting)    PONV (postoperative nausea and vomiting)    Preventative health care 08/14/2013   Tachycardia    Urinary frequency 12/11/2016    Patient Active Problem List   Diagnosis Date Noted   Cervical stenosis of spine 01/16/2018   Hyperlipidemia 07/19/2017   Obesity 01/30/2017   Urinary frequency 12/11/2016   Insomnia 09/13/2016   History of chicken pox    History of shingles    Preventative health care 08/14/2013   PONV (postoperative nausea and vomiting)    Anxiety    History of kidney stones    GERD (gastroesophageal reflux disease)    Insulin dependent diabetes mellitus (HCC)    Hypertension    Osteoarthritis    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    Class: Chronic    Past Surgical History:  Procedure Laterality Date   ANTERIOR CERVICAL DECOMP/DISCECTOMY FUSION N/A 01/16/2018   Procedure: ANTERIOR CERVICAL DECOMPRESSION/DISCECTOMY FUSION CERVICAL FOUR-FIVE, CERVICAL FIVE-SIX;  Surgeon: Lisbeth Renshaw, MD;  Location: [facility] OR;  Service: Neurosurgery;  Laterality: N/A;  ANTERIOR CERVICAL DECOMPRESSION/DISCECTOMY FUSION CERVICAL FOUR-FIVE, CERVICAL FIVE-SIX   CHOLECYSTECTOMY  1980's   CYSTOSCOPY/RETROGRADE/URETEROSCOPY/STONE EXTRACTION WITH BASKET Right 08/27/2003   and double J  stent placement   INGUINAL HERNIA REPAIR Left 1990's   KNEE ARTHROSCOPY Left 1980's   KNEE ARTHROSCOPY Right 2013   KNEE ARTHROSCOPY Right 06/30/2013   Procedure: RIGHT KNEE ARTHROSCOPY AND SYNOVECTOMY;  Surgeon: Velna Ochs, MD;  Location: [facility];  Service: Orthopedics;  Laterality: Right;   LITHOTRIPSY     multiple, b/l   SHOULDER ADHESION RELEASE Left ` 1998   SHOULDER ARTHROSCOPY W/ ROTATOR CUFF REPAIR Left 10/20/2004   x 4, had to have part of clavile removed. torn rotator cuff multiple times,  had to place screws    SHOULDER ARTHROSCOPY W/ SUPERIOR LABRAL ANTERIOR POSTERIOR LESION REPAIR Left 12/22/2004   TOTAL KNEE ARTHROPLASTY Right 12/23/2012   x3, 2 arthroscopies and a TKR,  TOTAL KNEE ARTHROPLASTY;  Surgeon: Velna Ochs, MD;  Location: [facility] OR;  Service: Orthopedics;  Laterality: Right;  DEPUY, RNFA   WRIST SURGERY Right    x 3, torn ligaments, pins placed then infected, then fused with plate        Home Medications    Prior to Admission medications   Medication Sig Start Date End Date Taking? Authorizing Provider  ASPIRIN PO Take 1 tablet by mouth once as needed (for pain).   Yes [provider]  Dulaglutide (TRULICITY) 1.5 MG/0.5ML SOPN Inject under skin weekly Patient taking differently: Inject 1.5 mg into the skin every Tuesday. Inject under skin weekly 07/01/18  Yes Carlus Pavlov, MD  escitalopram (LEXAPRO) 20 MG tablet TAKE 1 TABLET BY MOUTH ONCE DAILY Patient taking differently: Take 20 mg by mouth every morning.  09/18/18  Yes Bradd Canary, MD  gabapentin (NEURONTIN) 600 MG tablet Take 600 mg by mouth 2 (two) times daily. *May take 3 times daily as prescribed 04/21/18  Yes [provider]  insulin aspart (NOVOLOG FLEXPEN) 100 UNIT/ML FlexPen Inject 25-35 units 3 times daily with meals Patient taking differently: Inject 10 Units into the skin 3 (three) times daily with meals.  07/01/18  Yes Carlus Pavlov, MD  insulin detemir (LEVEMIR) 100 UNIT/ML injection Inject 0.75 mLs (75 Units total) into the skin at bedtime. Patient taking differently: Inject 60 Units into the skin at bedtime.  07/01/18  Yes Carlus Pavlov, MD  lansoprazole (PREVACID) 30 MG capsule Take 30 mg by mouth every morning.    Yes [provider]  lisinopril (PRINIVIL,ZESTRIL) 10 MG tablet Take 1 tablet (10 mg total) by mouth daily. Patient taking differently: Take 10 mg by mouth every evening.  12/02/17  Yes Copland, Gwenlyn Found, MD  loratadine (CLARITIN) 10 MG  tablet Take 10 mg by mouth every morning.    Yes [provider]  metFORMIN (GLUCOPHAGE-XR) 500 MG 24 hr tablet Take 1 tablet (500 mg total) by mouth 2 (two) times daily with a meal. 07/01/18  Yes Carlus Pavlov, MD  methocarbamol (ROBAXIN) 750 MG tablet Take 750 mg by mouth 2 (two) times daily. *May take one tablet every 6 hours* 10/12/18  Yes [provider]    Family History Family History  Problem Relation Age of Onset   COPD Mother        smoker   Hypertension Mother    Heart disease Mother        CHF   Diabetes Father    Heart disease Father        quintuple bypass   Stroke Father    Hypertension Father    Peripheral vascular disease Father    Hypertension Son  tachycardia   Heart disease Maternal Grandfather    Arthritis Paternal Grandmother    Colon cancer Neg Hx    Rectal cancer Neg Hx    Stomach cancer Neg Hx     Social History Social History   Tobacco Use   Smoking status: Never Smoker   Smokeless tobacco: Current User    Types: Snuff  Substance Use Topics   Alcohol use: No    Frequency: Never   Drug use: No     Allergies   Toradol [ketorolac tromethamine]   Review of Systems Review of Systems ROS: Statement: All systems negative except as marked or noted in the HPI; Constitutional: Negative for fever and chills. ; ; Eyes: Negative for eye pain, redness and discharge. ; ; ENMT: Negative for ear pain, hoarseness, nasal congestion, sinus pressure and sore throat. ; ; Cardiovascular: Negative for chest pain, palpitations, diaphoresis, dyspnea and peripheral edema. ; ; Respiratory: Negative for cough, wheezing and stridor. ; ; Gastrointestinal: Negative for nausea, vomiting, diarrhea, abdominal pain, blood in stool, hematemesis, jaundice and rectal bleeding. . ; ; Genitourinary: Negative for dysuria, flank pain and hematuria. ; ; Musculoskeletal: Negative for back pain and neck pain. Negative for swelling and trauma.; ;  Skin: Negative for pruritus, rash, abrasions, blisters, bruising and skin lesion.; ; Neuro: +chronic headache, paresthesias, extremity weakness. Negative for lightheadedness and neck stiffness. Negative for weakness, altered level of consciousness, altered mental status, involuntary movement, seizure and syncope.       Physical Exam Updated Vital Signs BP (!) 157/113 (BP Location: Right Arm)    Pulse (!) 106    Temp 97.8 F (36.6 C) (Oral)    Resp 19    Ht 6' (1.829 m)    Wt 102.5 kg    SpO2 99%    BMI 30.65 kg/m     Patient Vitals for the past 24 hrs:  BP Temp Temp src Pulse Resp SpO2 Height Weight  11/03/18 2300 (!) 143/97 -- -- 93 (!) 21 94 % -- --  11/03/18 2245 -- -- -- 89 (!) 21 97 % -- --  11/03/18 2230 (!) 154/110 -- -- 89 15 98 % -- --  11/03/18 2215 -- -- -- 92 (!) 21 98 % -- --  11/03/18 2200 (!) 157/115 -- -- 90 19 98 % -- --  11/03/18 2149 -- -- -- 94 17 98 % -- --  11/03/18 2130 -- -- -- 98 (!) 22 99 % -- --  11/03/18 2056 (!) 157/113 97.8 F (36.6 C) Oral (!) 106 19 99 % 6' (1.829 m) 102.5 kg     Physical Exam 2115: Physical examination:  Nursing notes reviewed; Vital signs and O2 SAT reviewed;  Constitutional: Well developed, Well nourished, Well hydrated, In no acute distress; Head:  Normocephalic, atraumatic; Eyes: EOMI, PERRL, No scleral icterus; ENMT: Mouth and pharynx normal, Mucous membranes moist; Neck: Supple, Full range of motion, No lymphadenopathy; Cardiovascular: Regular rate and rhythm, No gallop; Respiratory: Breath sounds clear & equal bilaterally, No wheezes.  Speaking full sentences with ease, Normal respiratory effort/excursion; Chest: Nontender, Movement normal; Abdomen: Soft, Nontender, Nondistended, Normal bowel sounds; Genitourinary: No CVA tenderness; Extremities: Peripheral pulses normal, No tenderness, No edema, No calf edema or asymmetry.; Neuro: AA&Ox3, Major CN grossly intact. Speech clear.  No facial droop. Grips equal. Strength 5/5 equal bilat  UE's and LE's.  DTR 2/4 equal bilat UE's and LE's.  No gross sensory deficits.  Normal cerebellar testing bilat UE's (finger-nose) and LE's (heel-shin)..;  Skin: Color normal, Warm, Dry.    ED Treatments / Results  Labs (all labs ordered are listed, but only abnormal results are displayed)   EKG EKG Interpretation  Date/Time:  Monday November 03 2018 21:26:21 EDT Ventricular Rate:  96 PR Interval:    QRS Duration: 109 QT Interval:  361 QTC Calculation: 457 R Axis:   80 Text Interpretation:  Sinus rhythm Minimal ST elevation, inferior leads Baseline wander Artifact When compared with ECG of 01/13/2018 No significant change was found Confirmed by Samuel Jester [id number]) on 11/03/2018 9:32:29 PM   Radiology   Procedures Procedures (including critical care time)  Medications Ordered in ED Medications - No data to display   Initial Impression / Assessment and Plan / ED Course  I have reviewed the triage vital signs and the nursing notes.  Pertinent labs & imaging results that were available during my care of the patient were reviewed by me and considered in my medical decision making (see chart for details).     MDM Reviewed: previous chart, nursing note and vitals Reviewed previous: labs and ECG Interpretation: labs, ECG and CT scan     Results for orders placed or performed during the hospital encounter of 11/03/18  Ethanol  Result Value Ref Range   Alcohol, Ethyl (B) <10 <10 mg/dL  Protime-INR  Result Value Ref Range   Prothrombin Time 12.3 11.4 - 15.2 seconds   INR 0.9 0.8 - 1.2  APTT  Result Value Ref Range   aPTT 28 24 - 36 seconds  CBC  Result Value Ref Range   WBC 7.8 4.0 - 10.5 K/uL   RBC 5.36 4.22 - 5.81 MIL/uL   Hemoglobin 16.5 13.0 - 17.0 g/dL   HCT 52.8 41.3 - 24.4 %   MCV 86.6 80.0 - 100.0 fL   MCH 30.8 26.0 - 34.0 pg   MCHC 35.6 30.0 - 36.0 g/dL   RDW 01.0 27.2 - 53.6 %   Platelets 257 150 - 400 K/uL   nRBC 0.0 0.0 - 0.2 %  Differential   Result Value Ref Range   Neutrophils Relative % 48 %   Neutro Abs 3.8 1.7 - 7.7 K/uL   Lymphocytes Relative 39 %   Lymphs Abs 3.0 0.7 - 4.0 K/uL   Monocytes Relative 9 %   Monocytes Absolute 0.7 0.1 - 1.0 K/uL   Eosinophils Relative 2 %   Eosinophils Absolute 0.1 0.0 - 0.5 K/uL   Basophils Relative 1 %   Basophils Absolute 0.1 0.0 - 0.1 K/uL   Immature Granulocytes 1 %   Abs Immature Granulocytes 0.04 0.00 - 0.07 K/uL  Comprehensive metabolic panel  Result Value Ref Range   Sodium 133 (L) 135 - 145 mmol/L   Potassium 4.1 3.5 - 5.1 mmol/L   Chloride 98 98 - 111 mmol/L   CO2 24 22 - 32 mmol/L   Glucose, Bld 377 (H) 70 - 99 mg/dL   BUN 13 6 - 20 mg/dL   Creatinine, Ser 6.44 0.61 - 1.24 mg/dL   Calcium 8.9 8.9 - 03.4 mg/dL   Total Protein 7.0 6.5 - 8.1 g/dL   Albumin 4.0 3.5 - 5.0 g/dL   AST 17 15 - 41 U/L   ALT 29 0 - 44 U/L   Alkaline Phosphatase 74 38 - 126 U/L   Total Bilirubin 1.5 (H) 0.3 - 1.2 mg/dL   GFR calc non Af Amer >60 >60 mL/min   GFR calc Af Amer >60 >60 mL/min  Anion gap 11 5 - 15  Urine rapid drug screen (hosp performed)not at [facility]  Result Value Ref Range   Opiates NONE DETECTED NONE DETECTED   Cocaine NONE DETECTED NONE DETECTED   Benzodiazepines NONE DETECTED NONE DETECTED   Amphetamines NONE DETECTED NONE DETECTED   Tetrahydrocannabinol NONE DETECTED NONE DETECTED   Barbiturates NONE DETECTED NONE DETECTED  Urinalysis, Routine w reflex microscopic  Result Value Ref Range   Color, Urine YELLOW YELLOW   APPearance CLEAR CLEAR   Specific Gravity, Urine 1.033 (H) 1.005 - 1.030   pH 5.0 5.0 - 8.0   Glucose, UA >=500 (A) NEGATIVE mg/dL   Hgb urine dipstick NEGATIVE NEGATIVE   Bilirubin Urine NEGATIVE NEGATIVE   Ketones, ur 5 (A) NEGATIVE mg/dL   Protein, ur 30 (A) NEGATIVE mg/dL   Nitrite NEGATIVE NEGATIVE   Leukocytes,Ua NEGATIVE NEGATIVE   Bacteria, UA NONE SEEN NONE SEEN  Troponin I - ONCE - STAT  Result Value Ref Range   Troponin I <0.03 <0.03  ng/mL    Ct Head Wo Contrast Result Date: 11/03/2018 CLINICAL DATA:  58 year old male with left-sided numbness and tingling and paresthesia. EXAM: CT HEAD WITHOUT CONTRAST CT CERVICAL SPINE WITHOUT CONTRAST TECHNIQUE: Multidetector CT imaging of the head and cervical spine was performed following the standard protocol without intravenous contrast. Multiplanar CT image reconstructions of the cervical spine were also generated. COMPARISON:  Brain MRI dated 08/30/2017 FINDINGS: CT HEAD FINDINGS Brain: The ventricles and sulci appropriate size for patient's age. Minimal periventricular and deep white matter chronic microvascular ischemic changes noted. Subcentimeter hypodense focus in the region of the right lentiform nucleus may represent a small old lacunar infarct. There is no acute intracranial hemorrhage. No mass effect or midline shift. No extra-axial fluid collection. There is apparent crowding of the foramen magnum, likely related to low lying cerebellar tonsils important on the prior MRI. Vascular: No hyperdense vessel or unexpected calcification. Skull: Normal. Negative for fracture or focal lesion. Sinuses/Orbits: No acute finding. Other: None CT CERVICAL SPINE FINDINGS Alignment: Normal. Skull base and vertebrae: No acute fracture. No primary bone lesion or focal pathologic process. Soft tissues and spinal canal: No prevertebral fluid or swelling. No visible canal hematoma. Disc levels:  C4-C6 disc spacer and ACDF. Upper chest: Negative. Other: None IMPRESSION: 1. No acute intracranial hemorrhage. 2. No acute/traumatic cervical spine pathology. 3. C4-C6 disc spacer and ACDF. Electronically Signed   By: Elgie Collard M.D.   On: 11/03/2018 22:42   Ct Cervical Spine Wo Contrast Result Date: 11/03/2018 CLINICAL DATA:  58 year old male with left-sided numbness and tingling and paresthesia. EXAM: CT HEAD WITHOUT CONTRAST CT CERVICAL SPINE WITHOUT CONTRAST TECHNIQUE: Multidetector CT imaging of the head  and cervical spine was performed following the standard protocol without intravenous contrast. Multiplanar CT image reconstructions of the cervical spine were also generated. COMPARISON:  Brain MRI dated 08/30/2017 FINDINGS: CT HEAD FINDINGS Brain: The ventricles and sulci appropriate size for patient's age. Minimal periventricular and deep white matter chronic microvascular ischemic changes noted. Subcentimeter hypodense focus in the region of the right lentiform nucleus may represent a small old lacunar infarct. There is no acute intracranial hemorrhage. No mass effect or midline shift. No extra-axial fluid collection. There is apparent crowding of the foramen magnum, likely related to low lying cerebellar tonsils important on the prior MRI. Vascular: No hyperdense vessel or unexpected calcification. Skull: Normal. Negative for fracture or focal lesion. Sinuses/Orbits: No acute finding. Other: None CT CERVICAL SPINE FINDINGS Alignment: Normal. Skull  base and vertebrae: No acute fracture. No primary bone lesion or focal pathologic process. Soft tissues and spinal canal: No prevertebral fluid or swelling. No visible canal hematoma. Disc levels:  C4-C6 disc spacer and ACDF. Upper chest: Negative. Other: None IMPRESSION: 1. No acute intracranial hemorrhage. 2. No acute/traumatic cervical spine pathology. 3. C4-C6 disc spacer and ACDF. Electronically Signed   By: Elgie Collard M.D.   On: 11/03/2018 22:42     2325:  Pt not code stroke due to resolved symptoms. CT without acute pathology. CBG elevated per hx DM, AG normal.  T/C returned from [facility] Dr. Lewis Moccasin, case discussed, including:  HPI, pertinent PM/SHx, VS/PE, dx testing, ED course and treatment: he has evaluated pt while in the ED, likely TIA's as cause for symptoms, recommends permissive hypertension for now and admission for further stroke workup (see separate consult note).   2340:  T/C returned from [facility] Dr. Flossie Dibble, case discussed, including:   HPI, pertinent PM/SHx, VS/PE, dx testing, ED course and treatment:  Agreeable to admit.       Final Clinical Impressions(s) / ED Diagnoses   Final diagnoses:  None    ED Discharge Orders    None       Samuel Jester, DO 11/05/18 1708

## 2018-11-03 NOTE — ED Triage Notes (Signed)
ED via POV c/o left side numbness since yesterday. Pt just back from [facility] picking up a vehicle. Pt had right side numbness today, stating left numbness has resolved. Pt c/o severe HA today.Pt returned to town 15 mins ago.

## 2018-11-04 ENCOUNTER — Observation Stay (HOSPITAL_BASED_OUTPATIENT_CLINIC_OR_DEPARTMENT_OTHER): Payer: Medicare HMO

## 2018-11-04 ENCOUNTER — Other Ambulatory Visit (HOSPITAL_COMMUNITY): Payer: Self-pay | Admitting: Cardiology

## 2018-11-04 ENCOUNTER — Observation Stay (HOSPITAL_COMMUNITY): Payer: Medicare HMO

## 2018-11-04 DIAGNOSIS — G459 Transient cerebral ischemic attack, unspecified: Secondary | ICD-10-CM | POA: Diagnosis not present

## 2018-11-04 DIAGNOSIS — E119 Type 2 diabetes mellitus without complications: Secondary | ICD-10-CM | POA: Diagnosis not present

## 2018-11-04 DIAGNOSIS — I6523 Occlusion and stenosis of bilateral carotid arteries: Secondary | ICD-10-CM | POA: Diagnosis not present

## 2018-11-04 DIAGNOSIS — R51 Headache: Secondary | ICD-10-CM | POA: Diagnosis not present

## 2018-11-04 DIAGNOSIS — E785 Hyperlipidemia, unspecified: Secondary | ICD-10-CM | POA: Diagnosis not present

## 2018-11-04 DIAGNOSIS — R202 Paresthesia of skin: Secondary | ICD-10-CM | POA: Diagnosis not present

## 2018-11-04 DIAGNOSIS — I10 Essential (primary) hypertension: Secondary | ICD-10-CM | POA: Diagnosis not present

## 2018-11-04 DIAGNOSIS — R200 Anesthesia of skin: Secondary | ICD-10-CM | POA: Diagnosis not present

## 2018-11-04 LAB — VITAMIN B12: VITAMIN B 12: 458 pg/mL (ref 180–914)

## 2018-11-04 LAB — LIPID PANEL
CHOL/HDL RATIO: 6.4 ratio
Cholesterol: 216 mg/dL — ABNORMAL HIGH (ref 0–200)
HDL: 34 mg/dL — ABNORMAL LOW (ref >40)
LDL Cholesterol: UNDETERMINED mg/dL (ref 0–99)
Triglycerides: 567 mg/dL — ABNORMAL HIGH (ref <150)
VLDL: UNDETERMINED mg/dL (ref 0–40)

## 2018-11-04 LAB — BRAIN NATRIURETIC PEPTIDE: B Natriuretic Peptide: 16 pg/mL (ref 0.0–100.0)

## 2018-11-04 LAB — GLUCOSE, CAPILLARY
Glucose-Capillary: 283 mg/dL — ABNORMAL HIGH (ref 70–99)
Glucose-Capillary: 212 mg/dL — ABNORMAL HIGH (ref 70–99)

## 2018-11-04 LAB — MAGNESIUM: Magnesium: 1.9 mg/dL (ref 1.7–2.4)

## 2018-11-04 LAB — TSH: TSH: 4.628 u[IU]/mL — ABNORMAL HIGH (ref 0.350–4.500)

## 2018-11-04 LAB — HEMOGLOBIN A1C
HEMOGLOBIN A1C: 8.8 % — AB (ref 4.8–5.6)
MEAN PLASMA GLUCOSE: 205.86 mg/dL

## 2018-11-04 LAB — PHOSPHORUS: Phosphorus: 3.3 mg/dL (ref 2.5–4.6)

## 2018-11-04 MED ORDER — TOPIRAMATE 25 MG PO TABS: 25.0000 mg | ORAL_TABLET | Freq: Every day | ORAL | 2 refills | Status: DC

## 2018-11-04 MED ORDER — INSULIN ASPART 100 UNIT/ML FLEXPEN: 10.0000 [IU] | PEN_INJECTOR | Freq: Three times a day (TID) | SUBCUTANEOUS | Status: DC

## 2018-11-04 MED ORDER — ESCITALOPRAM OXALATE 10 MG PO TABS
20.0000 mg | ORAL_TABLET | Freq: Every day | ORAL | Status: DC
  Administered 2018-11-04: 20 mg via ORAL
  Filled 2018-11-04: qty 2

## 2018-11-04 MED ORDER — LORATADINE 10 MG PO TABS
10.0000 mg | ORAL_TABLET | Freq: Every morning | ORAL | Status: DC
  Administered 2018-11-04: 10 mg via ORAL
  Filled 2018-11-04: qty 1

## 2018-11-04 MED ORDER — PANTOPRAZOLE SODIUM 40 MG PO TBEC
40.0000 mg | DELAYED_RELEASE_TABLET | Freq: Every day | ORAL | Status: DC
  Administered 2018-11-04: 40 mg via ORAL
  Filled 2018-11-04: qty 1

## 2018-11-04 MED ORDER — CLOPIDOGREL BISULFATE 75 MG PO TABS
75.0000 mg | ORAL_TABLET | Freq: Every day | ORAL | Status: DC
  Administered 2018-11-04: 75 mg via ORAL
  Filled 2018-11-04: qty 1

## 2018-11-04 MED ORDER — INSULIN DETEMIR 100 UNIT/ML ~~LOC~~ SOLN
60.0000 [IU] | Freq: Every day | SUBCUTANEOUS | Status: DC
  Administered 2018-11-04: 60 [IU] via SUBCUTANEOUS
  Filled 2018-11-04 (×2): qty 0.6

## 2018-11-04 MED ORDER — GABAPENTIN 300 MG PO CAPS
600.0000 mg | ORAL_CAPSULE | Freq: Two times a day (BID) | ORAL | Status: DC
  Administered 2018-11-04 (×2): 600 mg via ORAL
  Filled 2018-11-04 (×2): qty 2

## 2018-11-04 MED ORDER — FISH OIL CONCENTRATE 1000 MG PO CAPS: 1.0000 | ORAL_CAPSULE | Freq: Every morning | ORAL | 3 refills | Status: AC

## 2018-11-04 MED ORDER — ASPIRIN 81 MG PO TBEC: 81.0000 mg | DELAYED_RELEASE_TABLET | Freq: Every day | ORAL | 3 refills | Status: AC

## 2018-11-04 MED ORDER — ATORVASTATIN CALCIUM 40 MG PO TABS: 80.0000 mg | ORAL_TABLET | Freq: Every day | ORAL | Status: DC

## 2018-11-04 NOTE — TOC Initial Note (Signed)
Transition of Care [facility]) - Initial/Assessment Note    Patient Details  Name: Sean Horn MRN: [id number] Date of Birth: 1960/11/01  [facility] CM/SW Contact:    Zayaan Kozak, Chrystine Oiler, RN Phone Number: 11/04/2018, 11:30 AM  Clinical Narrative:       Patient is from home, independent. His dad lives with him, for whom he cooks and cleans. Patient does have a RW and cane at home from a previous surgery, does not use/need currently. He does have a PCP, still drives to appointments. No O2, neb machine at home. [facility].  Seen by PT and OT- no recommendations. No CM needs identified or communicated.       Expected Discharge Plan: Home/Self Care Barriers to Discharge: No Barriers Identified   Patient Goals and CMS Choice Patient states their goals for this hospitalization and ongoing recovery are:: ready to get home      Expected Discharge Plan and Services Expected Discharge Plan: Home/Self Care         Expected Discharge Date: 11/05/18                        Prior Living Arrangements/Services     Patient language and need for interpreter reviewed:: No Do you feel safe going back to the place where you live?: Yes      Need for Family Participation in Patient Care: No (Comment)     Criminal Activity/Legal Involvement Pertinent to Current Situation/Hospitalization: No - Comment as needed  Activities of Daily Living Home Assistive Devices/Equipment: None ADL Screening (condition at time of admission) Patient's cognitive ability adequate to safely complete daily activities?: Yes Is the patient deaf or have difficulty hearing?: No Does the patient have difficulty seeing, even when wearing glasses/contacts?: No Does the patient have difficulty concentrating, remembering, or making decisions?: No Patient able to express need for assistance with ADLs?: Yes Does the patient have difficulty dressing or bathing?: No Independently performs ADLs?:  No Communication: Independent Dressing (OT): Independent Grooming: Independent Feeding: Independent Bathing: Independent Toileting: Independent In/Out Bed: Independent Walks in Home: Independent Does the patient have difficulty walking or climbing stairs?: No Weakness of Legs: None Weakness of Arms/Hands: Right   Admission diagnosis:  TIA (transient ischemic attack) [G45.9] Patient Active Problem List   Diagnosis Date Noted  . TIA (transient ischemic attack) 11/03/2018  . Cervical stenosis of spine 01/16/2018  . Hyperlipidemia 07/19/2017  . Obesity 01/30/2017  . Urinary frequency 12/11/2016  . Insomnia 09/13/2016  . History of chicken pox   . History of shingles   . Preventative health care 08/14/2013  . PONV (postoperative nausea and vomiting)   . Anxiety   . History of kidney stones   . GERD (gastroesophageal reflux disease)   . Insulin dependent diabetes mellitus (HCC)   . Hypertension   . Osteoarthritis   . 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

## 2018-11-04 NOTE — H&P (Addendum)
History and Physical   Patient: Sean Horn                            PCP: Bradd Canary, MD                    DOB: [birth year]            DOA: 11/03/2018 NFA:[id number]             DOS: 11/04/2018, 12:09 AM  Patient coming from:  Home  I have personally reviewed patient's medical records, in electronic medical records, including: [place] link, and care everywhere.    Chief Complaint:   Chief Complaint  Patient presents with   Numbness    History of present illness:    Sean Horn is a 58 y.o. male with medical history significant of HTN, DMII insulin dependent), Depression, Anxiety, Chronic pain (neck pain, head aches), GERD, presented: with acute onset of left-sided numbness.  Patient reports that this is the second episode, first episode happened yesterday around 5 or 6 PM. He experienced sudden left-sided numbness which quickly resolved.  He states he cannot pick up his left arm.  The episode lasted about 15 minutes and completely resolved.   Once again today around ten 10 or 11 a.m. while sitting in his car he developed right-sided weakness and numbness.  The numbness involves his right lower face, the entire right upper extremity.  This was also associated with unable to get his words out.  His symptoms lasted about 20 minutes today.  Episode subsequently resolved spontaneously. He also reports that he has been enduring acute on chronic headaches for many years but he has had intermittent headaches for the past 2 days.  He describes his headache as chronic and usual with normal headaches.  Without any visual disturbances, photophobia or phonophobia.  Currently stable denies any headaches visual changes asymmetric weaknesses or numbness.  Denies of having any difficulty with speech or slurring this.  Denies of having any gait disturbances.  Denies any upper respiratory symptoms such as fever, chills, nausea, vomiting.  Denies any shortness of breath or chest pain.   Denies of having any abdominal pain constipation or diarrhea.  Denies any dysuria.  Denies of having any joint pain muscle pain.  Denies of having any open wounds or rash.   ED Course: The patient was evaluated in ED, negative any focal neurological findings, labs are essentially within normal limits, with hyperglycemia, anuria. CT of the head and cervical spine without contrast was completed: Reporting of no acute intracranial hemorrhage of any spinal pathology with exception of C4 C6 disc space and ACDF. Tele-neurology was consulted recommended admission for TIA ruling out CVA.     Review of Systems: As per HPI otherwise 12 point review of systems negative.    Assessment / Plan:   Active Problems:  TIA (transient ischemic attack)/ r/o CVA  -The patient will be admitted to telemetry floor, close observation -Continue neurochecks -Allow permissive hypertension, - holding blood pressure medication, gentle IV fluid hydration -Follow-up with labs, PT/OT/speech -CT head was reviewed-acute intracranial hemorrhage, no acute traumatic cervical spinal pathology, C4-6 spacer with ADF -Tele-neurology recommended TIA CVA complete work-up -Neurology to follow in a.m., MRI of the brain 2D echocardiogram been ordered may be DC'd if no further episodes and no recommendation from neurology. - Initiated aspirin, Plavix, statins, pending labs including fasting lipid panel (anticipating patient to  be discharged on aspirin/Plavix and statins)  Diabetes mellitus type 2 insulin-dependent, hyperglycemia -Blood sugar monitor closely with a [facility] CBG checks, SSI -Home medication Metformin and Trulicity along with NovoLog will be held -Continue home dosage of Levemir with reduced dose -Strict diabetic diet, after bedside swallow evaluation   Acute on chronic pain neck pain/occipital neuralgia -Continue patient's home dose of Neurontin -Withholding muscle relaxants for tonight -PRN p.o.  analgesics  Hypertension -Permissive hypertension -Therefore holding his home medication of lisinopril -Gentle IV fluid hydration  Anxiety/depression -Continue home medication Lexapro  GERD -Subsequent PPI with Protonix p.o.  Seasonal allergies -Continue loratadine     DVT prophylaxis: Heparin SQ  Code Status:   Code Status: Full Code  Family Communication:  The above findings and plan of care has been discussed with patient in detail, he expressed understanding and agreement of above plan.   Disposition Plan:  Anticipated 1-2 days  Consults called:  Tele neurology  Admission status: Patient will be admitted as Observation, with a less than 2 midnight length of stay.  ----------------------------------------------------------------------------------------------------------------------  Allergies  Allergen Reactions   Toradol [Ketorolac Tromethamine] Itching    Home MEDs:  Prior to Admission medications   Medication Sig Start Date End Date Taking? Authorizing Provider  Dulaglutide (TRULICITY) 1.5 MG/0.5ML SOPN Inject under skin weekly Patient taking differently: Inject 1.5 mg into the skin every Tuesday. Inject under skin weekly 07/01/18  Yes Carlus Pavlov, MD  escitalopram (LEXAPRO) 20 MG tablet TAKE 1 TABLET BY MOUTH ONCE DAILY Patient taking differently: Take 20 mg by mouth every morning.  09/18/18  Yes Bradd Canary, MD  gabapentin (NEURONTIN) 600 MG tablet Take 600 mg by mouth 2 (two) times daily. *May take 3 times daily as prescribed 04/21/18  Yes [provider]  insulin aspart (NOVOLOG FLEXPEN) 100 UNIT/ML FlexPen Inject 25-35 units 3 times daily with meals Patient taking differently: Inject 10 Units into the skin 3 (three) times daily with meals.  07/01/18  Yes Carlus Pavlov, MD  insulin detemir (LEVEMIR) 100 UNIT/ML injection Inject 0.75 mLs (75 Units total) into the skin at bedtime. Patient taking differently: Inject 60 Units into the skin  at bedtime.  07/01/18  Yes Carlus Pavlov, MD  lansoprazole (PREVACID) 30 MG capsule Take 30 mg by mouth every morning.    Yes [provider]  lisinopril (PRINIVIL,ZESTRIL) 10 MG tablet Take 1 tablet (10 mg total) by mouth daily. Patient taking differently: Take 10 mg by mouth every evening.  12/02/17  Yes Copland, Gwenlyn Found, MD  loratadine (CLARITIN) 10 MG tablet Take 10 mg by mouth every morning.    Yes [provider]  metFORMIN (GLUCOPHAGE-XR) 500 MG 24 hr tablet Take 1 tablet (500 mg total) by mouth 2 (two) times daily with a meal. 07/01/18  Yes Carlus Pavlov, MD  methocarbamol (ROBAXIN) 750 MG tablet Take 750 mg by mouth 2 (two) times daily. *May take one tablet every 6 hours* 10/12/18  Yes [provider]    PRN MEDs: acetaminophen **OR** acetaminophen, albuterol, albuterol, HYDROcodone-acetaminophen, magnesium citrate, ondansetron **OR** ondansetron (ZOFRAN) IV, senna-docusate, sorbitol  Past Medical History:  Diagnosis Date   Anxiety    Chronic headaches    Chronic neck pain    GERD (gastroesophageal reflux disease)    occasional - OTC as needed   History of chicken pox    History of kidney stones    History of shingles    Hypertension    under control with med., had been on med.  since age 55   Insomnia 09/13/2016   Insulin dependent diabetes mellitus (HCC)    Follows with endocrinology, Dr Chestine Spore reports his last hgba1c was 7.3    Non-insulin dependent type 2 diabetes mellitus (HCC)    Obesity 01/30/2017   Occipital neuralgia    Osteoarthritis    right Horn   PONV (postoperative nausea and vomiting)    PONV (postoperative nausea and vomiting)    Preventative health care 08/14/2013   Tachycardia    Urinary frequency 12/11/2016    Past Surgical History:  Procedure Laterality Date   ANTERIOR CERVICAL DECOMP/DISCECTOMY FUSION N/A 01/16/2018   Procedure: ANTERIOR CERVICAL DECOMPRESSION/DISCECTOMY FUSION CERVICAL FOUR-FIVE,  CERVICAL FIVE-SIX;  Surgeon: Lisbeth Renshaw, MD;  Location: [facility] OR;  Service: Neurosurgery;  Laterality: N/A;  ANTERIOR CERVICAL DECOMPRESSION/DISCECTOMY FUSION CERVICAL FOUR-FIVE, CERVICAL FIVE-SIX   CHOLECYSTECTOMY  1980's   CYSTOSCOPY/RETROGRADE/URETEROSCOPY/STONE EXTRACTION WITH BASKET Right 08/27/2003   and double J stent placement   INGUINAL HERNIA REPAIR Left 1990's   Horn ARTHROSCOPY Left 1980's   Horn ARTHROSCOPY Right 2013   Horn ARTHROSCOPY Right 06/30/2013   Procedure: RIGHT Horn ARTHROSCOPY AND SYNOVECTOMY;  Surgeon: Velna Ochs, MD;  Location: [facility];  Service: Orthopedics;  Laterality: Right;   LITHOTRIPSY     multiple, b/l   SHOULDER ADHESION RELEASE Left ` 1998   SHOULDER ARTHROSCOPY W/ ROTATOR CUFF REPAIR Left 10/20/2004   x 4, had to have part of clavile removed. torn rotator cuff multiple times, had to place screws    SHOULDER ARTHROSCOPY W/ SUPERIOR LABRAL ANTERIOR POSTERIOR LESION REPAIR Left 12/22/2004   TOTAL Horn ARTHROPLASTY Right 12/23/2012   x3, 2 arthroscopies and a TKR,  TOTAL Horn ARTHROPLASTY;  Surgeon: Velna Ochs, MD;  Location: [facility] OR;  Service: Orthopedics;  Laterality: Right;  DEPUY, RNFA   WRIST SURGERY Right    x 3, torn ligaments, pins placed then infected, then fused with plate     reports that he has never smoked. His smokeless tobacco use includes snuff. He reports that he does not drink alcohol or use drugs.   Family History  Problem Relation Age of Onset   COPD Mother        smoker   Hypertension Mother    Heart disease Mother        CHF   Diabetes Father    Heart disease Father        quintuple bypass   Stroke Father    Hypertension Father    Peripheral vascular disease Father    Hypertension Son        tachycardia   Heart disease Maternal Grandfather    Arthritis Paternal Grandmother    Colon cancer Neg Hx    Rectal cancer Neg Hx    Stomach cancer Neg Hx     Physical Exam:    Constitutional: NAD, calm, comfortable Vitals:   11/03/18 2245 11/03/18 2300 11/03/18 2330 11/04/18 0000  BP:  (!) 143/97 (!) 144/126 (!) 152/106  Pulse: 89 93 93 84  Resp: (!) 21 (!) 21 (!) 25 (!) 26  Temp:      TempSrc:      SpO2: 97% 94% 97% 96%  Weight:      Height:       Eyes: PERRL, lids and conjunctivae normal ENMT: Mucous membranes are moist. Posterior pharynx clear of any exudate or lesions.Normal dentition.  Neck: normal, supple, no masses, no thyromegaly Respiratory: clear to auscultation bilaterally, no wheezing, no crackles. Normal respiratory effort.  No accessory muscle use.  Cardiovascular: Regular rate and rhythm, no murmurs / rubs / gallops. No extremity edema. 2+ pedal pulses. No carotid bruits.  Abdomen: no tenderness, no masses palpated. No hepatosplenomegaly. Bowel sounds positive.  Musculoskeletal: no clubbing / cyanosis. No joint deformity upper and lower extremities. Good ROM, no contractures. Normal muscle tone.  Neurologic: CN II-XII grossly intact. Sensation intact, DTR normal. Strength 5/5 in all 4.  Psychiatric: Normal judgment and insight. Alert and oriented x 3. Normal mood.  Skin: no rashes, lesions, ulcers. No induration Decubitus/ulcers: none  Urinary catheter: Chronic indwelling/was placed in this admission  Labs on admission:    I have personally reviewed following labs and imaging studies  CBC: Recent Labs  Lab 11/03/18 2131  WBC 7.8  NEUTROABS 3.8  HGB 16.5  HCT 46.4  MCV 86.6  PLT 257   Basic Metabolic Panel: Recent Labs  Lab 11/03/18 2131  NA 133*  K 4.1  CL 98  CO2 24  GLUCOSE 377*  BUN 13  CREATININE 0.86  CALCIUM 8.9   GFR: Estimated Creatinine Clearance: 117.4 mL/min (by C-G formula based on SCr of 0.86 mg/dL). Liver Function Tests: Recent Labs  Lab 11/03/18 2131  AST 17  ALT 29  ALKPHOS 74  BILITOT 1.5*  PROT 7.0  ALBUMIN 4.0   No results for input(s): LIPASE, AMYLASE in the last 168 hours. No results  for input(s): AMMONIA in the last 168 hours. Coagulation Profile: Recent Labs  Lab 11/03/18 2131  INR 0.9   Cardiac Enzymes: Recent Labs  Lab 11/03/18 2131  TROPONINI <0.03     CBG: Recent Labs  Lab 11/03/18 2358  GLUCAP 289*  Urine analysis:    Component Value Date/Time   COLORURINE YELLOW 11/03/2018 2115   APPEARANCEUR CLEAR 11/03/2018 2115   LABSPEC 1.033 (H) 11/03/2018 2115   PHURINE 5.0 11/03/2018 2115   GLUCOSEU >=500 (A) 11/03/2018 2115   GLUCOSEU >=1000 (A) 12/11/2016 1622   HGBUR NEGATIVE 11/03/2018 2115   BILIRUBINUR NEGATIVE 11/03/2018 2115   KETONESUR 5 (A) 11/03/2018 2115   PROTEINUR 30 (A) 11/03/2018 2115   UROBILINOGEN 0.2 12/11/2016 1622   NITRITE NEGATIVE 11/03/2018 2115   LEUKOCYTESUR NEGATIVE 11/03/2018 2115     Radiologic Exams on Admission:   Ct Head Wo Contrast  Result Date: 11/03/2018 CLINICAL DATA:  58 year old male with left-sided numbness and tingling and paresthesia. EXAM: CT HEAD WITHOUT CONTRAST CT CERVICAL SPINE WITHOUT CONTRAST TECHNIQUE: Multidetector CT imaging of the head and cervical spine was performed following the standard protocol without intravenous contrast. Multiplanar CT image reconstructions of the cervical spine were also generated. COMPARISON:  Brain MRI dated 08/30/2017 FINDINGS: CT HEAD FINDINGS Brain: The ventricles and sulci appropriate size for patient's age. Minimal periventricular and deep white matter chronic microvascular ischemic changes noted. Subcentimeter hypodense focus in the region of the right lentiform nucleus may represent a small old lacunar infarct. There is no acute intracranial hemorrhage. No mass effect or midline shift. No extra-axial fluid collection. There is apparent crowding of the foramen magnum, likely related to low lying cerebellar tonsils important on the prior MRI. Vascular: No hyperdense vessel or unexpected calcification. Skull: Normal. Negative for fracture or focal lesion. Sinuses/Orbits:  No acute finding. Other: None CT CERVICAL SPINE FINDINGS Alignment: Normal. Skull base and vertebrae: No acute fracture. No primary bone lesion or focal pathologic process. Soft tissues and spinal canal: No prevertebral fluid or swelling. No visible canal hematoma. Disc levels:  C4-C6 disc spacer and ACDF. Upper  chest: Negative. Other: None IMPRESSION: 1. No acute intracranial hemorrhage. 2. No acute/traumatic cervical spine pathology. 3. C4-C6 disc spacer and ACDF. Electronically Signed   By: Elgie Collard M.D.   On: 11/03/2018 22:42   Ct Cervical Spine Wo Contrast  Result Date: 11/03/2018 CLINICAL DATA:  58 year old male with left-sided numbness and tingling and paresthesia. EXAM: CT HEAD WITHOUT CONTRAST CT CERVICAL SPINE WITHOUT CONTRAST TECHNIQUE: Multidetector CT imaging of the head and cervical spine was performed following the standard protocol without intravenous contrast. Multiplanar CT image reconstructions of the cervical spine were also generated. COMPARISON:  Brain MRI dated 08/30/2017 FINDINGS: CT HEAD FINDINGS Brain: The ventricles and sulci appropriate size for patient's age. Minimal periventricular and deep white matter chronic microvascular ischemic changes noted. Subcentimeter hypodense focus in the region of the right lentiform nucleus may represent a small old lacunar infarct. There is no acute intracranial hemorrhage. No mass effect or midline shift. No extra-axial fluid collection. There is apparent crowding of the foramen magnum, likely related to low lying cerebellar tonsils important on the prior MRI. Vascular: No hyperdense vessel or unexpected calcification. Skull: Normal. Negative for fracture or focal lesion. Sinuses/Orbits: No acute finding. Other: None CT CERVICAL SPINE FINDINGS Alignment: Normal. Skull base and vertebrae: No acute fracture. No primary bone lesion or focal pathologic process. Soft tissues and spinal canal: No prevertebral fluid or swelling. No visible canal  hematoma. Disc levels:  C4-C6 disc spacer and ACDF. Upper chest: Negative. Other: None IMPRESSION: 1. No acute intracranial hemorrhage. 2. No acute/traumatic cervical spine pathology. 3. C4-C6 disc spacer and ACDF. Electronically Signed   By: Elgie Collard M.D.   On: 11/03/2018 22:42    EKG:   Independently reviewed.   Orders placed or performed during the hospital encounter of 11/03/18   ED EKG   ED EKG   EKG 12-Lead   EKG 12-Lead   EKG 12-Lead     Time spent: > than  55 Min.   Kendell Bane MD [facility] ,  Pager [phone]  If 7PM-7AM, please contact night-coverage [email or web address]  Password [facility] 11/04/2018, 12:09 AM

## 2018-11-04 NOTE — Consult Note (Signed)
WOC Nurse wound consult note Reason for Consult: Consult received for evaluation and treatment. Bedside RN reports that patient has no WOC issues, that previous shift admitting RN reported a few areas with dried serum (scabs) on the LEs. No erythema, drainage. Wound type: N/A Pressure Injury POA: N/A  WOC nursing team will not follow, but will remain available to this patient, the nursing and medical teams.  Please re-consult if needed. Thanks, Ladona Mow, MSN, RN, GNP, [place]  Pager# [phone]

## 2018-11-04 NOTE — Progress Notes (Signed)
PT Cancellation Note  Patient Details Name: Sean Horn MRN: [id number] DOB: 25-Jan-1961   Cancelled Treatment:    Reason Eval/Treat Not Completed: PT screened, no needs identified, will sign off   Therapist went to evaluate patient. Patient was being transported back to his room via wheelchair from imaging. Therapist watched as patient stood from chair independently without difficulty and ambulated to his bed. Therapist discussed with patient if he felt he had any deficits at this time and patient reported that he did not. He stated that he felt that he was back to "normal". Patient is at baseline, without further physical therapy needs at this time. Therapist will sign off.    Verne Carrow PT, DPT 10:59 AM, 11/04/18 [phone]

## 2018-11-04 NOTE — Plan of Care (Signed)
  Problem: Education: Goal: Knowledge of disease or condition will improve Outcome: Progressing Goal: Knowledge of secondary prevention will improve Outcome: Progressing Goal: Knowledge of patient specific risk factors addressed and post discharge goals established will improve Outcome: Progressing Goal: Individualized Educational Video(s) Outcome: Progressing   

## 2018-11-04 NOTE — Progress Notes (Signed)
OT Cancellation Note  Patient Details Name: Sean Horn MRN: [id number] DOB: 1960-08-25   Cancelled Treatment:    Reason Eval/Treat Not Completed: OT screened, no needs identified, will sign off. Pt reports symptoms have resolved. Pt screened for OT needs, is at baseline completing ADLs and functional mobility tasks independently. No further OT needs at this time.   Ezra Sites, OTR/L  [phone] 11/04/2018, 8:21 AM

## 2018-11-04 NOTE — Care Management Obs Status (Signed)
MEDICARE OBSERVATION STATUS NOTIFICATION   Patient Details  Name: Sean Horn MRN: [id number] Date of Birth: 1960/10/15   Medicare Observation Status Notification Given:  Yes    Nabeel Gladson, Chrystine Oiler, RN 11/04/2018, 11:11 AM

## 2018-11-04 NOTE — Progress Notes (Signed)
*  PRELIMINARY RESULTS* Echocardiogram 2D Echocardiogram has been performed with saline bubble study.  Stacey Drain 11/04/2018, 12:28 PM

## 2018-11-04 NOTE — Plan of Care (Signed)
Patient verbalized understanding of stroke education and new medication plavix for risk reduction.

## 2018-11-04 NOTE — Discharge Summary (Signed)
Physician Discharge Summary  Sean Horn FAO:[id number] DOB: 06-28-1961 DOA: 11/03/2018  PCP: Bradd Canary, MD  Admit date: 11/03/2018  Discharge date: 11/04/2018  Admitted From:Home  Disposition:  Home  Recommendations for Outpatient Follow-up:  1. Follow up with PCP in 1-2 weeks 2. Follow-up with Dr. Gerilyn Pilgrim in 4 weeks 3. Remain on aspirin 81 mg daily in addition to Topamax 25 mg daily as prescribed 4. Remain on fish oil daily as prescribed and follow-up with PCP regarding lipids 5. Please follow-up TSH levels for evaluation of hypothyroidism 6. Please follow-up 2D echocardiogram/bubble study  Home Health: None  Equipment/Devices: None  Discharge Condition: Stable  CODE STATUS: Full  Diet recommendation: Heart Healthy/carb modified  Brief/Interim Summary: Per HPI: Sean Horn is a 58 y.o. male with medical history significant of HTN, DMII insulin dependent), Depression, Anxiety, Chronic pain (neck pain, head aches), GERD, presented: with acute onset of left-sided numbness.  Patient reports that this is the second episode, first episode happened yesterday around 5 or 6 PM. He experienced sudden left-sided numbness which quickly resolved.  He states he cannot pick up his left arm.  The episode lasted about 15 minutes and completely resolved.   Once again today around ten 10 or 11 a.m. while sitting in his car he developed right-sided weakness and numbness.  The numbness involves his right lower face, the entire right upper extremity.  This was also associated with unable to get his words out.  His symptoms lasted about 20 minutes today.  Episode subsequently resolved spontaneously. He also reports that he has been enduring acute on chronic headaches for many years but he has had intermittent headaches for the past 2 days.  He describes his headache as chronic and usual with normal headaches.  Without any visual disturbances, photophobia or phonophobia.  Currently  stable denies any headaches visual changes asymmetric weaknesses or numbness.  Denies of having any difficulty with speech or slurring this.  Denies of having any gait disturbances.  Denies any upper respiratory symptoms such as fever, chills, nausea, vomiting.  Denies any shortness of breath or chest pain.  Denies of having any abdominal pain constipation or diarrhea.  Denies any dysuria.  Denies of having any joint pain muscle pain.  Denies of having any open wounds or rash.  Patient was admitted with symptoms suggestive of TIA.  He underwent further evaluation with brain MRI/MRI with no findings of CVA noted.  It appears that given the history of headaches, he may have had an atypical migraine response for which I have discussed the case with Dr. Gerilyn Pilgrim of neurology who recommends keeping the patient on aspirin 81 mg daily as well as Topamax 25 mg daily with follow-up to the office in 4 weeks.  He has undergone carotid ultrasound bilaterally with minimal plaque noted and has also undergone 2D echocardiogram with bubble study which should be followed up in the outpatient setting.  No other acute events noted during the course of the stay.  No needs per PT/OT.  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  Discharge Instructions  Discharge Instructions    Diet - low sodium heart healthy   Complete by:  As directed    Increase activity slowly   Complete by:  As directed      Allergies as of 11/04/2018      Reactions   Toradol [ketorolac Tromethamine] Itching  Medication List    TAKE these medications   aspirin 81 MG EC tablet Take 1 tablet (81 mg total) by mouth daily for 30 days. Start taking on:  November 05, 2018   Dulaglutide 1.5 MG/0.5ML Sopn Commonly known as:  Editor, commissioning under skin weekly What changed:    how much to take  how to take this  when to  take this   escitalopram 20 MG tablet Commonly known as:  LEXAPRO TAKE 1 TABLET BY MOUTH ONCE DAILY What changed:  when to take this   Fish Oil Concentrate 1000 MG Caps Take 1 capsule (1,000 mg total) by mouth every morning for 30 days.   gabapentin 600 MG tablet Commonly known as:  NEURONTIN Take 600 mg by mouth 2 (two) times daily. *May take 3 times daily as prescribed   insulin aspart 100 UNIT/ML FlexPen Commonly known as:  NovoLOG FlexPen Inject 25-35 units 3 times daily with meals What changed:    how much to take  how to take this  when to take this  additional instructions   insulin detemir 100 UNIT/ML injection Commonly known as:  Levemir Inject 0.75 mLs (75 Units total) into the skin at bedtime. What changed:  how much to take   lansoprazole 30 MG capsule Commonly known as:  PREVACID Take 30 mg by mouth every morning.   lisinopril 10 MG tablet Commonly known as:  PRINIVIL,ZESTRIL Take 1 tablet (10 mg total) by mouth daily. What changed:  when to take this   loratadine 10 MG tablet Commonly known as:  CLARITIN Take 10 mg by mouth every morning.   metFORMIN 500 MG 24 hr tablet Commonly known as:  GLUCOPHAGE-XR Take 1 tablet (500 mg total) by mouth 2 (two) times daily with a meal.   methocarbamol 750 MG tablet Commonly known as:  ROBAXIN Take 750 mg by mouth 2 (two) times daily. *May take one tablet every 6 hours*   topiramate 25 MG tablet Commonly known as:  Topamax Take 1 tablet (25 mg total) by mouth at bedtime.      Follow-up Information    Bradd Canary, MD Follow up in 2 week(s).   Specialty:  Family Medicine Contact information: [address] [place] [address] [phone]        Beryle Beams, MD Follow up in 4 week(s).   Specialty:  Neurology Contact information: [address] [place] [address] [phone]          Allergies  Allergen Reactions  . Toradol [Ketorolac Tromethamine] Itching     Consultations:  Spoke with Dr. Gerilyn Pilgrim on phone   Procedures/Studies: Ct Head Wo Contrast  Result Date: 11/03/2018 CLINICAL DATA:  58 year old male with left-sided numbness and tingling and paresthesia. EXAM: CT HEAD WITHOUT CONTRAST CT CERVICAL SPINE WITHOUT CONTRAST TECHNIQUE: Multidetector CT imaging of the head and cervical spine was performed following the standard protocol without intravenous contrast. Multiplanar CT image reconstructions of the cervical spine were also generated. COMPARISON:  Brain MRI dated 08/30/2017 FINDINGS: CT HEAD FINDINGS Brain: The ventricles and sulci appropriate size for patient's age. Minimal periventricular and deep white matter chronic microvascular ischemic changes noted. Subcentimeter hypodense focus in the region of the right lentiform nucleus may represent a small old lacunar infarct. There is no acute intracranial hemorrhage. No mass effect or midline shift. No extra-axial fluid collection. There is apparent crowding of the foramen magnum, likely related to low lying cerebellar tonsils important on the prior  MRI. Vascular: No hyperdense vessel or unexpected calcification. Skull: Normal. Negative for fracture or focal lesion. Sinuses/Orbits: No acute finding. Other: None CT CERVICAL SPINE FINDINGS Alignment: Normal. Skull base and vertebrae: No acute fracture. No primary bone lesion or focal pathologic process. Soft tissues and spinal canal: No prevertebral fluid or swelling. No visible canal hematoma. Disc levels:  C4-C6 disc spacer and ACDF. Upper chest: Negative. Other: None IMPRESSION: 1. No acute intracranial hemorrhage. 2. No acute/traumatic cervical spine pathology. 3. C4-C6 disc spacer and ACDF. Electronically Signed   By: Elgie Collard M.D.   On: 11/03/2018 22:42   Ct Cervical Spine Wo Contrast  Result Date: 11/03/2018 CLINICAL DATA:  58 year old male with left-sided numbness and tingling and paresthesia. EXAM: CT HEAD WITHOUT CONTRAST CT  CERVICAL SPINE WITHOUT CONTRAST TECHNIQUE: Multidetector CT imaging of the head and cervical spine was performed following the standard protocol without intravenous contrast. Multiplanar CT image reconstructions of the cervical spine were also generated. COMPARISON:  Brain MRI dated 08/30/2017 FINDINGS: CT HEAD FINDINGS Brain: The ventricles and sulci appropriate size for patient's age. Minimal periventricular and deep white matter chronic microvascular ischemic changes noted. Subcentimeter hypodense focus in the region of the right lentiform nucleus may represent a small old lacunar infarct. There is no acute intracranial hemorrhage. No mass effect or midline shift. No extra-axial fluid collection. There is apparent crowding of the foramen magnum, likely related to low lying cerebellar tonsils important on the prior MRI. Vascular: No hyperdense vessel or unexpected calcification. Skull: Normal. Negative for fracture or focal lesion. Sinuses/Orbits: No acute finding. Other: None CT CERVICAL SPINE FINDINGS Alignment: Normal. Skull base and vertebrae: No acute fracture. No primary bone lesion or focal pathologic process. Soft tissues and spinal canal: No prevertebral fluid or swelling. No visible canal hematoma. Disc levels:  C4-C6 disc spacer and ACDF. Upper chest: Negative. Other: None IMPRESSION: 1. No acute intracranial hemorrhage. 2. No acute/traumatic cervical spine pathology. 3. C4-C6 disc spacer and ACDF. Electronically Signed   By: Elgie Collard M.D.   On: 11/03/2018 22:42   Mr Maxine Glenn Head Wo Contrast  Result Date: 11/04/2018 CLINICAL DATA:  Separate episodes of transient numbness involving both sides of the body. Headache. EXAM: MRI HEAD WITHOUT CONTRAST MRA HEAD WITHOUT CONTRAST TECHNIQUE: Multiplanar, multiecho pulse sequences of the brain and surrounding structures were obtained without intravenous contrast. Angiographic images of the head were obtained using MRA technique without contrast.  COMPARISON:  Head CT 11/03/2018 and MRI 08/30/2017. Carotid Doppler ultrasound 06/30/2004. FINDINGS: MRI HEAD FINDINGS Brain: No definite acute infarct is identified. A few small punctate areas of mildly increased trace diffusion signal bilaterally are attributed to a combination of T2 shine through and noise. No intracranial hemorrhage, mass, midline shift, or extra-axial fluid collection is identified. An 8 mm focus of T2 hyperintensity in right frontoparietal subcortical white matter is new from 2019 and has an appearance most suggestive of a chronic lacunar infarct. Dilated perivascular spaces are noted in the basal ganglia bilaterally. The ventricles and sulci are normal. Minimal cerebellar tonsillar ectopia is again noted. Vascular: Major intracranial vascular flow voids are preserved. Skull and upper cervical spine: Unremarkable bone marrow signal. Sinuses/Orbits: Unremarkable orbits. Minimal right maxillary sinus mucosal thickening. No significant mastoid fluid. Other: None. MRA HEAD FINDINGS Signal loss in the distal V3 and proximal V4 segments bilaterally is considered artifactual. The more distal V4 segments are widely patent to the vertebrobasilar junction and are equal in size. Patent PICAs are present bilaterally. AICAs and SCAs are  not well seen. The basilar artery is widely patent. Posterior communicating arteries are not identified and may be diminutive or absent. PCAs are patent with asymmetric branch vessel attenuation on the right but no evidence of significant proximal stenosis. The distal cervical ICAs are tortuous bilaterally. The intracranial ICAs are patent without evidence of stenosis. ACAs and MCAs are patent without evidence of proximal branch occlusion or significant proximal stenosis. No aneurysm is identified. IMPRESSION: 1. No acute intracranial abnormality. 2. Chronic right frontoparietal subcortical lacunar infarct. 3. No major intracranial arterial occlusion, significant proximal  stenosis, or aneurysm. Electronically Signed   By: Sebastian Ache M.D.   On: 11/04/2018 11:11   Mr Brain Wo Contrast  Result Date: 11/04/2018 CLINICAL DATA:  Separate episodes of transient numbness involving both sides of the body. Headache. EXAM: MRI HEAD WITHOUT CONTRAST MRA HEAD WITHOUT CONTRAST TECHNIQUE: Multiplanar, multiecho pulse sequences of the brain and surrounding structures were obtained without intravenous contrast. Angiographic images of the head were obtained using MRA technique without contrast. COMPARISON:  Head CT 11/03/2018 and MRI 08/30/2017. Carotid Doppler ultrasound 06/30/2004. FINDINGS: MRI HEAD FINDINGS Brain: No definite acute infarct is identified. A few small punctate areas of mildly increased trace diffusion signal bilaterally are attributed to a combination of T2 shine through and noise. No intracranial hemorrhage, mass, midline shift, or extra-axial fluid collection is identified. An 8 mm focus of T2 hyperintensity in right frontoparietal subcortical white matter is new from 2019 and has an appearance most suggestive of a chronic lacunar infarct. Dilated perivascular spaces are noted in the basal ganglia bilaterally. The ventricles and sulci are normal. Minimal cerebellar tonsillar ectopia is again noted. Vascular: Major intracranial vascular flow voids are preserved. Skull and upper cervical spine: Unremarkable bone marrow signal. Sinuses/Orbits: Unremarkable orbits. Minimal right maxillary sinus mucosal thickening. No significant mastoid fluid. Other: None. MRA HEAD FINDINGS Signal loss in the distal V3 and proximal V4 segments bilaterally is considered artifactual. The more distal V4 segments are widely patent to the vertebrobasilar junction and are equal in size. Patent PICAs are present bilaterally. AICAs and SCAs are not well seen. The basilar artery is widely patent. Posterior communicating arteries are not identified and may be diminutive or absent. PCAs are patent with  asymmetric branch vessel attenuation on the right but no evidence of significant proximal stenosis. The distal cervical ICAs are tortuous bilaterally. The intracranial ICAs are patent without evidence of stenosis. ACAs and MCAs are patent without evidence of proximal branch occlusion or significant proximal stenosis. No aneurysm is identified. IMPRESSION: 1. No acute intracranial abnormality. 2. Chronic right frontoparietal subcortical lacunar infarct. 3. No major intracranial arterial occlusion, significant proximal stenosis, or aneurysm. Electronically Signed   By: Sebastian Ache M.D.   On: 11/04/2018 11:11   US Carotid Bilateral  Result Date: 11/04/2018 CLINICAL DATA:  Left-sided paresthesias, hypertension, hyperlipidemia, diabetes EXAM: BILATERAL CAROTID DUPLEX ULTRASOUND TECHNIQUE: Wallace Cullens scale imaging, color Doppler and duplex ultrasound were performed of bilateral carotid and vertebral arteries in the neck. COMPARISON:  None. FINDINGS: Criteria: Quantification of carotid stenosis is based on velocity parameters that correlate the residual internal carotid diameter with NASCET-based stenosis levels, using the diameter of the distal internal carotid lumen as the denominator for stenosis measurement. The following velocity measurements were obtained: RIGHT ICA: 80/15 cm/sec CCA: 86/14 cm/sec SYSTOLIC ICA/CCA RATIO:  0.9 ECA: 85 cm/sec LEFT ICA: 58/19 cm/sec CCA: 89/14 cm/sec SYSTOLIC ICA/CCA RATIO:  0.6 ECA: 66 cm/sec RIGHT CAROTID ARTERY: Minor echogenic shadowing plaque formation. No hemodynamically  significant right ICA stenosis, velocity elevation, or turbulent flow. Degree of narrowing less than 50%. RIGHT VERTEBRAL ARTERY:  Antegrade LEFT CAROTID ARTERY: Similar scattered minor echogenic plaque formation. No hemodynamically significant left ICA stenosis, velocity elevation, or turbulent flow. LEFT VERTEBRAL ARTERY:  Antegrade IMPRESSION: Minor carotid atherosclerosis. No hemodynamically significant ICA  stenosis. Degree of narrowing less than 50% bilaterally by ultrasound criteria. Patent antegrade vertebral flow bilaterally Electronically Signed   By: Judie Petit.  Shick M.D.   On: 11/04/2018 11:51     Discharge Exam: Vitals:   11/04/18 1055 11/04/18 1249  BP: (!) 168/103 (!) 163/97  Pulse: 70 71  Resp: 19 18  Temp: 97.7 F (36.5 C) 97.6 F (36.4 C)  SpO2: 96% 97%   Vitals:   11/04/18 0658 11/04/18 0857 11/04/18 1055 11/04/18 1249  BP: (!) 148/94 (!) 135/93 (!) 168/103 (!) 163/97  Pulse: 76 80 70 71  Resp: 16 18 19 18   Temp: 97.9 F (36.6 C) 97.7 F (36.5 C) 97.7 F (36.5 C) 97.6 F (36.4 C)  TempSrc: Oral Oral Oral Oral  SpO2: 96% 96% 96% 97%  Weight:      Height:        General: Pt is alert, awake, not in acute distress Cardiovascular: RRR, S1/S2 +, no rubs, no gallops Respiratory: CTA bilaterally, no wheezing, no rhonchi Abdominal: Soft, NT, ND, bowel sounds + Extremities: no edema, no cyanosis    The results of significant diagnostics from this hospitalization (including imaging, microbiology, ancillary and laboratory) are listed below for reference.     Microbiology: No results found for this or any previous visit (from the past 240 hour(s)).   Labs: BNP (last 3 results) Recent Labs    11/03/18 2131  BNP 16.0   Basic Metabolic Panel: Recent Labs  Lab 11/03/18 2131  NA 133*  K 4.1  CL 98  CO2 24  GLUCOSE 377*  BUN 13  CREATININE 0.86  CALCIUM 8.9  MG 1.9  PHOS 3.3   Liver Function Tests: Recent Labs  Lab 11/03/18 2131  AST 17  ALT 29  ALKPHOS 74  BILITOT 1.5*  PROT 7.0  ALBUMIN 4.0   No results for input(s): LIPASE, AMYLASE in the last 168 hours. No results for input(s): AMMONIA in the last 168 hours. CBC: Recent Labs  Lab 11/03/18 2131  WBC 7.8  NEUTROABS 3.8  HGB 16.5  HCT 46.4  MCV 86.6  PLT 257   Cardiac Enzymes: Recent Labs  Lab 11/03/18 2131  TROPONINI <0.03   BNP: Invalid input(s): POCBNP CBG: Recent Labs  Lab  11/03/18 2358 11/04/18 0734 11/04/18 1108  GLUCAP 289* 283* 212*   D-Dimer No results for input(s): DDIMER in the last 72 hours. Hgb A1c Recent Labs    11/03/18 2131  HGBA1C 8.8*   Lipid Profile Recent Labs    11/03/18 2131  CHOL 216*  HDL 34*  LDLCALC UNABLE TO CALCULATE IF TRIGLYCERIDE OVER 400 mg/dL  TRIG 591*  CHOLHDL 6.4   Thyroid function studies Recent Labs    11/03/18 2131  TSH 4.628*   Anemia work up Recent Labs    11/03/18 2131  VITAMINB12 458   Urinalysis    Component Value Date/Time   COLORURINE YELLOW 11/03/2018 2115   APPEARANCEUR CLEAR 11/03/2018 2115   LABSPEC 1.033 (H) 11/03/2018 2115   PHURINE 5.0 11/03/2018 2115   GLUCOSEU >=500 (A) 11/03/2018 2115   GLUCOSEU >=1000 (A) 12/11/2016 1622   HGBUR NEGATIVE 11/03/2018 2115   BILIRUBINUR NEGATIVE 11/03/2018  2115   KETONESUR 5 (A) 11/03/2018 2115   PROTEINUR 30 (A) 11/03/2018 2115   UROBILINOGEN 0.2 12/11/2016 1622   NITRITE NEGATIVE 11/03/2018 2115   LEUKOCYTESUR NEGATIVE 11/03/2018 2115   Sepsis Labs Invalid input(s): PROCALCITONIN,  WBC,  LACTICIDVEN Microbiology No results found for this or any previous visit (from the past 240 hour(s)).   Time coordinating discharge: 35 minutes  SIGNED:   Erick Blinks, DO [facility] 11/04/2018, 12:50 PM   If 7PM-7AM, please contact night-coverage [email or web address] Password [email or web address]

## 2018-11-05 LAB — HIV ANTIBODY (ROUTINE TESTING W REFLEX): HIV Screen 4th Generation wRfx: NONREACTIVE

## 2018-11-06 ENCOUNTER — Other Ambulatory Visit: Payer: Self-pay

## 2018-11-06 ENCOUNTER — Ambulatory Visit (INDEPENDENT_AMBULATORY_CARE_PROVIDER_SITE_OTHER): Payer: Medicare HMO | Admitting: Family Medicine

## 2018-11-06 DIAGNOSIS — G459 Transient cerebral ischemic attack, unspecified: Secondary | ICD-10-CM | POA: Diagnosis not present

## 2018-11-06 DIAGNOSIS — E785 Hyperlipidemia, unspecified: Secondary | ICD-10-CM | POA: Diagnosis not present

## 2018-11-06 DIAGNOSIS — I10 Essential (primary) hypertension: Secondary | ICD-10-CM

## 2018-11-06 MED ORDER — COQ-10 75 MG PO CAPS: 75.0000 mg | ORAL_CAPSULE | Freq: Every day | ORAL | 0 refills | Status: DC

## 2018-11-06 MED ORDER — LEVOTHYROXINE SODIUM 25 MCG PO CAPS: 25.0000 ug | ORAL_CAPSULE | Freq: Every day | ORAL | 5 refills | Status: DC

## 2018-11-06 MED ORDER — ATORVASTATIN CALCIUM 10 MG PO TABS: 5.0000 mg | ORAL_TABLET | ORAL | 1 refills | Status: DC

## 2018-11-06 MED ORDER — LISINOPRIL 10 MG PO TABS: 10.0000 mg | ORAL_TABLET | Freq: Every day | ORAL | 1 refills | Status: DC

## 2018-11-06 MED ORDER — CARVEDILOL 12.5 MG PO TABS: 12.5000 mg | ORAL_TABLET | Freq: Two times a day (BID) | ORAL | 1 refills | Status: DC

## 2018-11-06 NOTE — Assessment & Plan Note (Signed)
Refill given on Lisinopril and Carvedilol. He had stopped the Carvedilol for about 6 months but at ED his pressure was up so he agrees to restart the Carvedilol. He will monitor BP daily and let us know if he runs high or low.

## 2018-11-06 NOTE — Assessment & Plan Note (Addendum)
Was seen in ER on 11/03/2018 with left sided weakness preceded the day before by right sided weakness. He had stopped his Aspirin so they restarted it. Patient feels well now with no persistent symptoms. Referred to neurology for further consideration

## 2018-11-09 NOTE — Assessment & Plan Note (Signed)
Encouraged heart healthy diet, increase exercise, avoid trans fats, consider a krill oil cap daily. Did not tolerate Crestor will try Atorvasatin 10 mg tabs, 1/2 tab po qod to start with CoQenzyme 10 daily.

## 2018-11-09 NOTE — Progress Notes (Signed)
Virtual Visit via Telephone Note  I connected with Sean Horn on 11/09/18 at 12:30 PM EDT by telephone and verified that I am speaking with the correct person using two identifiers.   I discussed the limitations, risks, security and privacy concerns of performing an evaluation and management service by telephone and the availability of in person appointments. I also discussed with the patient that there may be a patient responsible charge related to this service. The patient expressed understanding and agreed to proceed.   History of Present Illness: Patient seen via telephone as he was unable to get his video working. He feels well today but did present recently to hospital with left sided weakness. Had full resolution and MRI did not have any acute concerns although an old infarct was identified. Denies CP/palp/SOB/HA/congestion/fevers/GI or GU c/o. Taking meds as prescribed    Observations/Objective:unable to obtain via phone.    Assessment and Plan:  TIA (transient ischemic attack) Was seen in ER on 11/03/2018 with left sided weakness preceded the day before by right sided weakness. He had stopped his Aspirin so they restarted it. Patient feels well now with no persistent symptoms. Referred to neurology for further consideration  Hypertension Refill given on Lisinopril and Carvedilol. He had stopped the Carvedilol for about 6 months but at ED his pressure was up so he agrees to restart the Carvedilol. He will monitor BP daily and let us know if he runs high or low.   Hyperlipidemia Encouraged heart healthy diet, increase exercise, avoid trans fats, consider a krill oil cap daily. Did not tolerate Crestor will try Atorvasatin 10 mg tabs, 1/2 tab po qod to start with CoQenzyme 10 daily.     Follow Up Instructions:    I discussed the assessment and treatment plan with the patient. The patient was provided an opportunity to ask questions and all were answered. The patient agreed  with the plan and demonstrated an understanding of the instructions.   The patient was advised to call back or seek an in-person evaluation if the symptoms worsen or if the condition fails to improve as anticipated.  I provided 21 minutes of non-face-to-face time during this encounter.   Danise Edge, MD

## 2018-11-17 ENCOUNTER — Telehealth: Payer: Self-pay | Admitting: *Deleted

## 2018-11-17 NOTE — Telephone Encounter (Signed)
Called pt & LVM (ok per DPR) asking for call back to update pt's chart (allergies, meds, etc) in advance of his virtual visit with Dr. Lucia Gaskins tomorrow. Left office number and hours in message.

## 2018-11-18 ENCOUNTER — Encounter: Payer: Self-pay | Admitting: *Deleted

## 2018-11-18 ENCOUNTER — Other Ambulatory Visit: Payer: Self-pay

## 2018-11-18 ENCOUNTER — Ambulatory Visit (INDEPENDENT_AMBULATORY_CARE_PROVIDER_SITE_OTHER): Payer: Medicare HMO | Admitting: Neurology

## 2018-11-18 DIAGNOSIS — Z794 Long term (current) use of insulin: Secondary | ICD-10-CM

## 2018-11-18 DIAGNOSIS — R519 Headache, unspecified: Secondary | ICD-10-CM

## 2018-11-18 DIAGNOSIS — G459 Transient cerebral ischemic attack, unspecified: Secondary | ICD-10-CM | POA: Diagnosis not present

## 2018-11-18 DIAGNOSIS — G4733 Obstructive sleep apnea (adult) (pediatric): Secondary | ICD-10-CM

## 2018-11-18 DIAGNOSIS — I10 Essential (primary) hypertension: Secondary | ICD-10-CM | POA: Diagnosis not present

## 2018-11-18 DIAGNOSIS — IMO0001 Reserved for inherently not codable concepts without codable children: Secondary | ICD-10-CM

## 2018-11-18 DIAGNOSIS — E119 Type 2 diabetes mellitus without complications: Secondary | ICD-10-CM

## 2018-11-18 DIAGNOSIS — E785 Hyperlipidemia, unspecified: Secondary | ICD-10-CM | POA: Diagnosis not present

## 2018-11-18 DIAGNOSIS — R51 Headache: Secondary | ICD-10-CM | POA: Diagnosis not present

## 2018-11-18 DIAGNOSIS — E669 Obesity, unspecified: Secondary | ICD-10-CM | POA: Diagnosis not present

## 2018-11-18 MED ORDER — TOPIRAMATE 50 MG PO TABS: 50.0000 mg | ORAL_TABLET | Freq: Every day | ORAL | 6 refills | Status: DC

## 2018-11-18 NOTE — Progress Notes (Signed)
[facility]    Provider:  Dr Lucia Gaskins Referring Provider: Bradd Canary, MD Primary Care Physician:  Bradd Canary, MD  CC:  Headache  11/18/2018: Virtual Visit via Video Note  I connected with Sean Horn on 11/18/18 at  9:00 AM EDT by a video enabled telemedicine application and verified that I am speaking with the correct person using two identifiers. Patient at home, physician in office.   I discussed the limitations of evaluation and management by telemedicine and the availability of in person appointments. The patient expressed understanding and agreed to proceed.   Follow Up Instructions:    I discussed the assessment and treatment plan with the patient. The patient was provided an opportunity to ask questions and all were answered. The patient agreed with the plan and demonstrated an understanding of the instructions.   The patient was advised to call back or seek an in-person evaluation if the symptoms worsen or if the condition fails to improve as anticipated.  Anson Fret, MD    Interval history: Since last visit he as referred to Dr. Ollen Bowl for pin procedures in his cervical spine for occipital headaches and also referred to PT. He was on Gabapentin for pain. His repeat CT showed multi-level degenerative changes that could cause the occipital head pain. We also sent a referral to [facility] for his neck pain and for procedures. Patient had surgery 01/2018, discectomy at c4-c5,c5-c6. He had an ED visit on 10/24/2018. He had numbness, difficulty speaking and a headache. He was diagnosed with a TIA. He was started on ASA and Topamax . He is still having headaches. In the back of the head on both sides. He has a constant dull ache sometimes it gets worse. He had a sleep study in the past. 10 or more years ago. He does not know if he snores but he wakes frequently, he wakes every 2 hours, he wakes up with a bad headache in the morning, he naps  during the day sometimes. No light sensitivity, no sound sensitivity. Surgery did not help with the headache, he did not go to PT. The headache is daily and he has a headache the majority of the time. No medication overuse.  Carotid U/S: IMPRESSION: Minor carotid atherosclerosis. No hemodynamically significant ICA stenosis. Degree of narrowing less than 50% bilaterally by ultrasound criteria.  Patent antegrade vertebral flow bilaterally  MRI/MRA 11/04/2018: IMPRESSION: 1. No acute intracranial abnormality. 2. Chronic right frontoparietal subcortical lacunar infarct. 3. No major intracranial arterial occlusion, significant proximal stenosis, or aneurysm.  Echo without thrombus or pfo. HPI:  Sean Horn is a 58 y.o. male here as a referral from Dr. Abner Greenspan for headache. PMHx HTN, uncontrolled DM.  He had migraines since a teenager. Started 2 months ago in the setting of hyperglycemia, he had blurred vision for a week improved since glucose improved. Started in the morning like a hangover and stayed dull for the rest of the day, since then continuous, goes to sleep with it and wakes up with it. Muscle relaxers have helped. He woke one night with sharp head pain left eye. It is a dull ache that worsens sometimes, bending head back helps. Layingin bed helps. Headache is in the back of the head. Dull not pounding or throbbing. No light or sound sensitivity. No aura. He has some neck pain. Not burning or sharp. No significant snoring, sleep study was borderline, not excessively tired during the day. He has some shoulder pain, he  has had 4 shoulder surgeries. He has some numbness occ in the left hand when he wakes up mostly on left. No pain on neck movement or shooting pain in the neck. Not migrainous and not similar to previous migraines. No bowel or bladder changes, no ataxia or falls. No other focal neurologic deficits, associated symptoms, inciting events or modifiable factors.  Reviewed notes, labs  and imaging from outside physicians, which showed:  MRI c-spine 2005   1. Stable low lying cerebellar tonsils without beaking.   2. Disc bulges, disc herniations, spur formation and facet hypertrophy, as described above. The most significant finding on the right is at the C3-C4 level where uncinate spur formation is producing marked neural foraminal stenosis. The most significant finding on the left is at the C5-C6 level level where a combination of disc herniation and facet hypertrophy are producing mild to moderate foraminal stenosis and mild posterior displacement and minimal flattening of the spinal cord.  Reviewed images MRI cervical spine which showed multi-level degenerative changes and moderate canal stenosis (agree with report above):   HgbA1c 13.7 05/27/2017  Reviewed referring physician's notes.  He presented with a "massive headache".  Seem to get worse every 3-4 days.  He reported blurry vision but his glucose was high, vision back to normal after increasing his dose of Trulicity which has seems to be helping.  1-2 weeks ago he had a sharp pain in his head that woke him up during the night.  He also had an episode where he woke up and felt like his heart was fluttering and he felt out of breath.  Unusual headache for him in both duration and severity.  He did get migraines in his teens but none since then.  Feels better when not moving his head.  No nausea or vomiting.  No history of aura.  Personally reviewed MRI images of the brain which showed a low-lying cerebellar tonsil on the right measuring 5 mm stable no evidence of mass-effect on the tonsil.  Unchanged from 2005.  "Of doubtful significance".  MRI brain unrevealing for etiology, has low lying cerebellar tonsil that is stable unlikely causing symptoms (reviewed MRi brain images with patient and explained finding)   Review of Systems: Patient complains of symptoms per HPI as well as the following symptoms: anxiety,  headache, sleepiness. Pertinent negatives and positives per HPI. All others negative.   Social History   Socioeconomic History   Marital status: Single    Spouse name: Not on file   Number of children: 2   Years of education: Not on file   Highest education level: High school graduate  Occupational History   Occupation: Disabled  Ecologist strain: Not on file   Food insecurity:    Worry: Not on file    Inability: Not on file   Transportation needs:    Medical: Not on file    Non-medical: Not on file  Tobacco Use   Smoking status: Never Smoker   Smokeless tobacco: Current User    Types: Snuff  Substance and Sexual Activity   Alcohol use: No    Frequency: Never   Drug use: No   Sexual activity: Yes  Lifestyle   Physical activity:    Days per week: Not on file    Minutes per session: Not on file   Stress: Not on file  Relationships   Social connections:    Talks on phone: Not on file    Gets together:  Not on file    Attends religious service: Not on file    Active member of club or organization: Not on file    Attends meetings of clubs or organizations: Not on file    Relationship status: Not on file   Intimate partner violence:    Fear of current or ex partner: Not on file    Emotionally abused: Not on file    Physically abused: Not on file    Forced sexual activity: Not on file  Other Topics Concern   Not on file  Social History Narrative   Lives at home with his father (he takes care of his father)   Right handed   Drinks 2 cups of caffeine daily    Family History  Problem Relation Age of Onset   COPD Mother        smoker   Hypertension Mother    Heart disease Mother        CHF   Diabetes Father    Heart disease Father        quintuple bypass   Stroke Father    Hypertension Father    Peripheral vascular disease Father    Hypertension Son        tachycardia   Heart disease Maternal Grandfather      Arthritis Paternal Grandmother    Colon cancer Neg Hx    Rectal cancer Neg Hx    Stomach cancer Neg Hx     Past Medical History:  Diagnosis Date   Anxiety    Chronic headaches    Chronic neck pain    GERD (gastroesophageal reflux disease)    occasional - OTC as needed   History of chicken pox    History of kidney stones    History of shingles    Hypertension    under control with med., had been on med. since age 58   Insomnia 09/13/2016   Insulin dependent diabetes mellitus (HCC)    Follows with endocrinology, Dr Chestine Spore reports his last hgba1c was 7.3    Non-insulin dependent type 2 diabetes mellitus (HCC)    Obesity 01/30/2017   Occipital neuralgia    Osteoarthritis    right Horn   PONV (postoperative nausea and vomiting)    PONV (postoperative nausea and vomiting)    Preventative health care 08/14/2013   Tachycardia    Urinary frequency 12/11/2016    Past Surgical History:  Procedure Laterality Date   ANTERIOR CERVICAL DECOMP/DISCECTOMY FUSION N/A 01/16/2018   Procedure: ANTERIOR CERVICAL DECOMPRESSION/DISCECTOMY FUSION CERVICAL FOUR-FIVE, CERVICAL FIVE-SIX;  Surgeon: Lisbeth Renshaw, MD;  Location: [facility] OR;  Service: Neurosurgery;  Laterality: N/A;  ANTERIOR CERVICAL DECOMPRESSION/DISCECTOMY FUSION CERVICAL FOUR-FIVE, CERVICAL FIVE-SIX   CHOLECYSTECTOMY  1980's   CYSTOSCOPY/RETROGRADE/URETEROSCOPY/STONE EXTRACTION WITH BASKET Right 08/27/2003   and double J stent placement   INGUINAL HERNIA REPAIR Left 1990's   Horn ARTHROSCOPY Left 1980's   Horn ARTHROSCOPY Right 2013   Horn ARTHROSCOPY Right 06/30/2013   Procedure: RIGHT Horn ARTHROSCOPY AND SYNOVECTOMY;  Surgeon: Velna Ochs, MD;  Location: [facility];  Service: Orthopedics;  Laterality: Right;   LITHOTRIPSY     multiple, b/l   SHOULDER ADHESION RELEASE Left ` 1998   SHOULDER ARTHROSCOPY W/ ROTATOR CUFF REPAIR Left 10/20/2004   x 4, had to have part of clavile  removed. torn rotator cuff multiple times, had to place screws    SHOULDER ARTHROSCOPY W/ SUPERIOR LABRAL ANTERIOR POSTERIOR LESION REPAIR Left 12/22/2004   TOTAL Horn ARTHROPLASTY  Right 12/23/2012   x3, 2 arthroscopies and a TKR,  TOTAL Horn ARTHROPLASTY;  Surgeon: Velna Ochs, MD;  Location: [facility] OR;  Service: Orthopedics;  Laterality: Right;  DEPUY, RNFA   WRIST SURGERY Right    x 3, torn ligaments, pins placed then infected, then fused with plate    Current Outpatient Medications  Medication Sig Dispense Refill   aspirin EC 81 MG EC tablet Take 1 tablet (81 mg total) by mouth daily for 30 days. 30 tablet 3   atorvastatin (LIPITOR) 10 MG tablet Take 0.5 tablets (5 mg total) by mouth every other day. 45 tablet 1   carvedilol (COREG) 12.5 MG tablet Take 1 tablet (12.5 mg total) by mouth 2 (two) times daily with a meal. 180 tablet 1   Coenzyme Q10 (COQ-10) 75 MG CAPS Take 75 mg by mouth daily.  0   Dulaglutide (TRULICITY) 1.5 MG/0.5ML SOPN Inject under skin weekly (Patient taking differently: Inject 1.5 mg into the skin every Tuesday. Inject under skin weekly) 12 pen 3   escitalopram (LEXAPRO) 20 MG tablet TAKE 1 TABLET BY MOUTH ONCE DAILY (Patient taking differently: Take 20 mg by mouth every morning. ) 90 tablet 0   gabapentin (NEURONTIN) 600 MG tablet Take 600 mg by mouth 2 (two) times daily. *May take 3 times daily as prescribed  11   insulin aspart (NOVOLOG FLEXPEN) 100 UNIT/ML FlexPen Inject 25-35 units 3 times daily with meals (Patient taking differently: Inject 10 Units into the skin 3 (three) times daily with meals. ) 30 mL 5   insulin detemir (LEVEMIR) 100 UNIT/ML injection Inject 0.75 mLs (75 Units total) into the skin at bedtime. (Patient taking differently: Inject 60 Units into the skin at bedtime. ) 80 mL 5   lansoprazole (PREVACID) 30 MG capsule Take 30 mg by mouth every morning.      Levothyroxine Sodium 25 MCG CAPS Take 1 capsule (25 mcg total) by mouth daily  before breakfast. 30 capsule 5   lisinopril (PRINIVIL,ZESTRIL) 10 MG tablet Take 1 tablet (10 mg total) by mouth daily. 90 tablet 1   loratadine (CLARITIN) 10 MG tablet Take 10 mg by mouth every morning.      metFORMIN (GLUCOPHAGE-XR) 500 MG 24 hr tablet Take 1 tablet (500 mg total) by mouth 2 (two) times daily with a meal. 60 tablet 11   methocarbamol (ROBAXIN) 750 MG tablet Take 750 mg by mouth 2 (two) times daily. *May take one tablet every 6 hours*     Omega-3 Fatty Acids (FISH OIL CONCENTRATE) 1000 MG CAPS Take 1 capsule (1,000 mg total) by mouth every morning for 30 days. 30 capsule 3   topiramate (TOPAMAX) 50 MG tablet Take 1 tablet (50 mg total) by mouth at bedtime. 30 tablet 6   No current facility-administered medications for this visit.     Allergies as of 11/18/2018 - Review Complete 11/18/2018  Allergen Reaction Noted   Rosuvastatin Other (See Comments) 11/06/2018   Toradol [ketorolac tromethamine] Itching 06/30/2012    Vitals: There were no vitals taken for this visit. Last Weight:  Wt Readings from Last 1 Encounters:  11/04/18 228 lb 9.6 oz (103.7 kg)   Last Height:   Ht Readings from Last 1 Encounters:  11/04/18 6' (1.829 m)   Physical exam: Exam: Gen: NAD, conversant, well nourised, obese, well groomed                     CV: RRR, no MRG. No Carotid Bruits.  No peripheral edema, warm, nontender Eyes: Conjunctivae clear without exudates or hemorrhage  Neuro: Detailed Neurologic Exam  Speech:    Speech is normal; fluent and spontaneous with normal comprehension.  Cognition:    The patient is oriented to person, place, and time;     recent and remote memory intact;     language fluent;     normal attention, concentration,     fund of knowledge Cranial Nerves:    The pupils are equal, round, and reactive to light. The fundi are normal and spontaneous venous pulsations are present. Visual fields are full to finger confrontation. Extraocular movements  are intact. Trigeminal sensation is intact and the muscles of mastication are normal. The face is symmetric. The palate elevates in the midline. Hearing intact. Voice is normal. Shoulder shrug is normal. The tongue has normal motion without fasciculations.   Coordination:    Normal finger to nose and heel to shin. Normal rapid alternating movements.   Gait:    Heel-toe and tandem gait are normal.   Motor Observation:    No asymmetry, no atrophy, and no involuntary movements noted. Tone:    Normal muscle tone.    Posture:    Posture is normal. normal erect    Strength:    Strength is V/V in the upper and lower limbs.      Sensation: intact to LT     Reflex Exam:  DTR's:    Right patellar hypo (surgical) otherwise deep tendon reflexes in the upper and lower extremities are normal bilaterally.   Toes:    The toes are downgoing bilaterally.   Clonus:    Clonus is absent.     Assessment/Plan:  58 year old with dull non-migranous occipital headache. PMHx HTN, uncontrolled DM, HLD, remote migraines, s/p ACDF last year that did not help, remote migraines. MRI brain unrevealing for etiology, has low lying cerebellar tonsil that is stable unlikely causing symptoms.   - Started Topiramate at hospital, increase to 50mg  at bedtime - Sleep evaluation via webex - he has morning headaches, wakes often, sleep apnea test 10+ years ago was positive for sleep apnea, recent TIA - For TIA: He was not treating his cholesterol (TG > 500 and ldl could not b ecalculated, his HTN or his Diabetes (HGBA1c >8 uncontrolled). He is now on proprer medications and working with Dr. Abner GreenspanBlyth his pcp. Continue - s/p ACDF on his neck, di dno thelp his occipital headache - obesity: recommend [facility], no referral needed he can call  I had a long d/w patient about his recent TIA, risk for recurrent stroke/TIAs, personally independently reviewed imaging studies and stroke evaluation  results and answered questions.Continue ASA for secondary stroke prevention and maintain strict control of hypertension with blood pressure goal below 130/90, diabetes with hemoglobin A1c goal below 6.5% and lipids with LDL cholesterol goal below 70 mg/dL I also advised the patient to eat a healthy diet with plenty of whole grains, cereals, fruits and vegetables, exercise regularly and maintain ideal body weight   Orders Placed This Encounter  Procedures   Ambulatory referral to Sleep Studies   Meds ordered this encounter  Medications   topiramate (TOPAMAX) 50 MG tablet    Sig: Take 1 tablet (50 mg total) by mouth at bedtime.    Dispense:  30 tablet    Refill:  6   email me in 4 weeks with update  To prevent or relieve headaches, try the following: Cool Compress.  Lie down and place a cool compress on your head.  Avoid headache triggers. If certain foods or odors seem to have triggered your migraines in the past, avoid them. A headache diary might help you identify triggers.  Include physical activity in your daily routine. Try a daily walk or other moderate aerobic exercise.  Manage stress. Find healthy ways to cope with the stressors, such as delegating tasks on your to-do list.  Practice relaxation techniques. Try deep breathing, yoga, massage and visualization.  Eat regularly. Eating regularly scheduled meals and maintaining a healthy diet might help prevent headaches. Also, drink plenty of fluids.  Follow a regular sleep schedule. Sleep deprivation might contribute to headaches Consider biofeedback. With this mind-body technique, you learn to control certain bodily functions -- such as muscle tension, heart rate and blood pressure -- to prevent headaches or reduce headache pain.    Proceed to emergency room if you experience new or worsening symptoms or symptoms do not resolve, if you have new neurologic symptoms or if headache is severe, or for any concerning symptom.   Provided  education and documentation from American headache Society toolbox including articles on: chronic migraine medication overuse headache, chronic migraines, prevention of migraines, behavioral and other nonpharmacologic treatments for headache.   Cc: Dr. Argentina Ponder, MD  [facility] [address] [place], [place] [address]  Phone [phone] Fax [phone]  A total of 40 minutes was spent video face-to-face with this patient. Over half this time was spent on counseling patient on the  1. TIA (transient ischemic attack)   2. OSA (obstructive sleep apnea)   3. Morning headache   4. Essential hypertension   5. Insulin dependent diabetes mellitus (HCC)   6. Obesity, unspecified classification, unspecified obesity type, unspecified whether serious comorbidity present   7. Hyperlipidemia, unspecified hyperlipidemia type   8. Occipital headache    diagnosis and different diagnostic and therapeutic options, counseling and coordination of care, risks ans benefits of management, compliance, or risk factor reduction and education.

## 2018-11-18 NOTE — Telephone Encounter (Signed)
Updated chart for appointment.

## 2018-11-20 ENCOUNTER — Telehealth: Payer: Self-pay | Admitting: Neurology

## 2018-11-20 NOTE — Telephone Encounter (Signed)
Due to current COVID 19 pandemic, our office is severely reducing in office visits, in order to minimize the risk to our patients and healthcare providers.  Pt understands that although there may be some limitations with this type of visit, we will take all precautions to reduce any security or privacy concerns.  Pt understands that this will be treated like an in office visit and we will file with pt's insurance, and there may be a patient responsible charge related to this service. Pt's email is [email or web address]. Pt understands that the [employer] webex software must be downloaded and operational on the device pt plans to use for the visit. Pt understands that the nurse will be calling to go over pt's chart.

## 2018-11-26 ENCOUNTER — Encounter: Payer: Self-pay | Admitting: Neurology

## 2018-11-26 NOTE — Telephone Encounter (Signed)
Called the patient and reviewed the chart and made sure everything was up to date. Made the patient aware of the sleep scale that I will send through mychart for him to complete prior to the apt. Advised that 30 min prior to the apt be available to answer the check in call from our front staff that way if troubleshoot help is needed we can do so prior to the apt. Pt verbalized understanding.

## 2018-12-02 ENCOUNTER — Encounter: Payer: Self-pay | Admitting: Neurology

## 2018-12-02 ENCOUNTER — Other Ambulatory Visit: Payer: Self-pay

## 2018-12-02 ENCOUNTER — Ambulatory Visit (INDEPENDENT_AMBULATORY_CARE_PROVIDER_SITE_OTHER): Payer: Medicare HMO | Admitting: Neurology

## 2018-12-02 DIAGNOSIS — R519 Headache, unspecified: Secondary | ICD-10-CM

## 2018-12-02 DIAGNOSIS — G459 Transient cerebral ischemic attack, unspecified: Secondary | ICD-10-CM

## 2018-12-02 DIAGNOSIS — E118 Type 2 diabetes mellitus with unspecified complications: Secondary | ICD-10-CM

## 2018-12-02 DIAGNOSIS — IMO0001 Reserved for inherently not codable concepts without codable children: Secondary | ICD-10-CM

## 2018-12-02 DIAGNOSIS — E119 Type 2 diabetes mellitus without complications: Secondary | ICD-10-CM

## 2018-12-02 DIAGNOSIS — Z794 Long term (current) use of insulin: Secondary | ICD-10-CM | POA: Diagnosis not present

## 2018-12-02 DIAGNOSIS — G8929 Other chronic pain: Secondary | ICD-10-CM | POA: Insufficient documentation

## 2018-12-02 DIAGNOSIS — E785 Hyperlipidemia, unspecified: Secondary | ICD-10-CM | POA: Diagnosis not present

## 2018-12-02 DIAGNOSIS — M4802 Spinal stenosis, cervical region: Secondary | ICD-10-CM

## 2018-12-02 DIAGNOSIS — M2619 Other specified anomalies of jaw-cranial base relationship: Secondary | ICD-10-CM

## 2018-12-02 DIAGNOSIS — I10 Essential (primary) hypertension: Secondary | ICD-10-CM

## 2018-12-02 DIAGNOSIS — R51 Headache: Secondary | ICD-10-CM

## 2018-12-02 DIAGNOSIS — G4701 Insomnia due to medical condition: Secondary | ICD-10-CM

## 2018-12-02 NOTE — Progress Notes (Signed)
Virtual Visit via Video Note  I connected with [name] on 12/02/18 at  9:00 AM EDT by a video enabled telemedicine application and verified that I am speaking with the correct person using two identifiers.   I discussed the limitations of evaluation and management by telemedicine and the availability of in person appointments. The patient expressed understanding and agreed to proceed.    Melvyn Novas, MD   [facility]   Provider:  Melvyn Novas, MD   Primary Care Physician:  Bradd Canary, MD   Referring Provider: Naomie Dean, MD     HPI:  JULEON BOSLEY is a 58 y.o. male , seen here as in a referral by Dr Lucia Gaskins. Chief complaint according to patient :  Mr. Grosser has been seen by my colleague Naomie Dean twice,  he had seen Dr. Ollen Bowl for cervical spine trigger point/ dry needle procedures, these were meant to help with occipital headaches.  He also had been referred to physical therapy.  He has continued to be taking gabapentin for pain.  A CT of the cervical spine had shown multilevel degenerative changes.and he underwent a surgery in June 2019 with a discectomy at C4-C5- C6.   He had an emergency room visit just on 24 October 2018 when he reported numbness, difficulty speaking and headaches-  he was diagnosed as a TIA was started on aspirin daily and topiramate.  The headaches have still continued and affecting both sides of the head a dull headache that seems to be worst in the temple bilaterally.  About 10 years or longer ago he had undergone a sleep study and he reports that his sleep has been nonrestorative and non-refreshing he wakes up frequently about every 2 hours but he is not sure if he snores.  Often he wakes with a bad headache in the morning and he naps during the day.  There is no photo or phonophobia and the surgery to the neck did not help with the headaches.  He also was referred to physical therapy but did not follow.  The headache has  occurred almost daily this was the reason why Dr. Lucia Gaskins saw the patient that she refers to me based on the most recently acquired diagnosis of TIA.  The patient had a transcranial Doppler which showed antegrade vertebral flow bilaterally minor carotid atherosclerosis was noted less than 50% narrowing.  He also underwent brain MRI and MRA on 04 November 2018 without any acute intracranial abnormality being seen.  No arterial occlusion.  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  He also underwent an echo which did not show thrombus or patent foramen ovale.     Sleep and medical history:  1. TIA (transient ischemic attack)   2. OSA (obstructive sleep apnea) - borderline Dx at [facility]  3. Morning headache   4. Essential hypertension since age 73 (!)  5. Insulin dependent diabetes mellitus (HCC)   6. Obesity, unspecified classification, unspecified obesity type, unspecified whether serious comorbidity present   7. Hyperlipidemia, unspecified hyperlipidemia type  Tachycardia   8. Occipital headache     Family medical and sleep history: Father has OSA, mother passed. Siblings don't have OSA- he has one brother and one sister.  There is a strong history of heart disease in his father who also suffers from diabetes, had coronary artery disease and underwent quadruple quintuple bypass and has also suffered a stroke.  The father has peripheral arterial disease  and hypertension, mother suffered from congestive heart failure hypertension and COPD- she was a smoker.   Social history: He lives with his father, is his caretaker. 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 No tobacco use, No ETOH use, caffeine : Soda with caffeine- Sun drop.  Sleep habits are as follows: Mr. [place] stated that his dinnertime is usually at 5.30 PM when he prepares dinner for his father, his bedtime ( the time he retreats to his cool, quiet and dark bedroom) is  around 11 PM, but sleep onset will be 1 or sometimes even 2 hours later.   Insomnia has been a big problem for him.  He sleeps in any position but prefers prone sleep he wakes up once asleep every 90 minutes or so he sleeps on one pillow in a flat bed does not have shortness of breath, palpitations, nausea or diaphoresis but wakes and mostly his pain related to back, neck, and joint location.  He may go to the bathroom once rarely twice at night he has also been woken by headaches but most commonly he just wakes up with a headache been present.  His sleep is described as unrefreshing and on restorative.  He rises at 7 AM and estimates a total sleep time of only 4 to 5 hours.  At 7 AM he states that he cannot possibly go back to sleep but the headache is often present and in daytime he sometimes struggles to stay awake but he tries to avoid naps.  Even power naps have not been considered refreshing.      Review of Systems: Out of a complete 14 system review, the patient complains of only the following symptoms, and all other reviewed systems are negative.  How likely are you to doze in the following situations: 0 = not likely, 1 = slight chance, 2 = moderate chance, 3 = high chance  Sitting and Reading? Watching Television? Sitting inactive in a public place (theater or meeting)? Lying down in the afternoon when circumstances permit? Sitting and talking to someone? Sitting quietly after lunch without alcohol? In a car, while stopped for a few minutes in traffic? As a passenger in a car for an hour without a break?  Total = 13/24    Epworth score 13,  Fatigue severity score 45/ 63  , Depression score: N/A Patient reports struggling to fall asleep, struggling to stay asleep, chronic pain conditions affecting his sleep, acid reflux,.  Social History   Socioeconomic History  . Marital status: Single    Spouse name: Not on file  . Number of children: 2  . Years of education: Not on file   . Highest education level: High school graduate  Occupational History  . Occupation: Disabled  Social Needs  . Financial resource strain: Not on file  . Food insecurity:    Worry: Not on file    Inability: Not on file  . Transportation needs:    Medical: Not on file    Non-medical: Not on file  Tobacco Use  . Smoking status: Never Smoker  . Smokeless tobacco: Current User    Types: Snuff  Substance and Sexual Activity  . Alcohol use: No    Frequency: Never  . Drug use: No  . Sexual activity: Yes  Lifestyle  . Physical activity:    Days per week: Not on file    Minutes per session: Not on file  . Stress: Not on file  Relationships  . Social  connections:    Talks on phone: Not on file    Gets together: Not on file    Attends religious service: Not on file    Active member of club or organization: Not on file    Attends meetings of clubs or organizations: Not on file    Relationship status: Not on file  . Intimate partner violence:    Fear of current or ex partner: Not on file    Emotionally abused: Not on file    Physically abused: Not on file    Forced sexual activity: Not on file  Other Topics Concern  . Not on file  Social History Narrative   Lives at home with his father (he takes care of his father)   Right handed   Drinks 2 cups of caffeine daily    Family History  Problem Relation Age of Onset  . COPD Mother        smoker  . Hypertension Mother   . Heart disease Mother        CHF  . Diabetes Father   . Heart disease Father        quintuple bypass  . Stroke Father   . Hypertension Father   . Peripheral vascular disease Father   . Hypertension Son        tachycardia  . Heart disease Maternal Grandfather   . Arthritis Paternal Grandmother   . Colon cancer Neg Hx   . Rectal cancer Neg Hx   . Stomach cancer Neg Hx     Past Medical History:  Diagnosis Date  . Anxiety   . Chronic headaches   . Chronic neck pain   . GERD (gastroesophageal reflux  disease)    occasional - OTC as needed  . History of chicken pox   . History of kidney stones   . History of shingles   . Hypertension    under control with med., had been on med. since age 54  . Insomnia 09/13/2016  . Insulin dependent diabetes mellitus (HCC)    Follows with endocrinology, Dr Chestine Spore reports his last hgba1c was 7.3   . Non-insulin dependent type 2 diabetes mellitus (HCC)   . Obesity 01/30/2017  . Occipital neuralgia   . Osteoarthritis    right knee  . PONV (postoperative nausea and vomiting)   . PONV (postoperative nausea and vomiting)   . Preventative health care 08/14/2013  . Tachycardia   . Urinary frequency 12/11/2016    Past Surgical History:  Procedure Laterality Date  . ANTERIOR CERVICAL DECOMP/DISCECTOMY FUSION N/A 01/16/2018   Procedure: ANTERIOR CERVICAL DECOMPRESSION/DISCECTOMY FUSION CERVICAL FOUR-FIVE, CERVICAL FIVE-SIX;  Surgeon: Lisbeth Renshaw, MD;  Location: [facility] OR;  Service: Neurosurgery;  Laterality: N/A;  ANTERIOR CERVICAL DECOMPRESSION/DISCECTOMY FUSION CERVICAL FOUR-FIVE, CERVICAL FIVE-SIX  . CHOLECYSTECTOMY  1980's  . CYSTOSCOPY/RETROGRADE/URETEROSCOPY/STONE EXTRACTION WITH BASKET Right 08/27/2003   and double J stent placement  . INGUINAL HERNIA REPAIR Left 1990's  . KNEE ARTHROSCOPY Left 1980's  . KNEE ARTHROSCOPY Right 2013  . KNEE ARTHROSCOPY Right 06/30/2013   Procedure: RIGHT KNEE ARTHROSCOPY AND SYNOVECTOMY;  Surgeon: Velna Ochs, MD;  Location: [facility];  Service: Orthopedics;  Laterality: Right;  . LITHOTRIPSY     multiple, b/l  . SHOULDER ADHESION RELEASE Left ` 1998  . SHOULDER ARTHROSCOPY W/ ROTATOR CUFF REPAIR Left 10/20/2004   x 4, had to have part of clavile removed. torn rotator cuff multiple times, had to place screws   . SHOULDER ARTHROSCOPY W/  SUPERIOR LABRAL ANTERIOR POSTERIOR LESION REPAIR Left 12/22/2004  . TOTAL KNEE ARTHROPLASTY Right 12/23/2012   x3, 2 arthroscopies and a TKR,  TOTAL KNEE  ARTHROPLASTY;  Surgeon: Velna OchsPeter G Dalldorf, MD;  Location: [facility] OR;  Service: Orthopedics;  Laterality: Right;  DEPUY, RNFA  . WRIST SURGERY Right    x 3, torn ligaments, pins placed then infected, then fused with plate    Current Outpatient Medications  Medication Sig Dispense Refill  . aspirin EC 81 MG EC tablet Take 1 tablet (81 mg total) by mouth daily for 30 days. 30 tablet 3  . atorvastatin (LIPITOR) 10 MG tablet Take 0.5 tablets (5 mg total) by mouth every other day. 45 tablet 1  . carvedilol (COREG) 12.5 MG tablet Take 1 tablet (12.5 mg total) by mouth 2 (two) times daily with a meal. 180 tablet 1  . Coenzyme Q10 (COQ-10) 75 MG CAPS Take 75 mg by mouth daily.  0  . Dulaglutide (TRULICITY) 1.5 MG/0.5ML SOPN Inject under skin weekly (Patient taking differently: Inject 1.5 mg into the skin every Tuesday. Inject under skin weekly) 12 pen 3  . escitalopram (LEXAPRO) 20 MG tablet TAKE 1 TABLET BY MOUTH ONCE DAILY (Patient taking differently: Take 20 mg by mouth every morning. ) 90 tablet 0  . gabapentin (NEURONTIN) 600 MG tablet Take 600 mg by mouth 2 (two) times daily. *May take 3 times daily as prescribed  11  . insulin aspart (NOVOLOG FLEXPEN) 100 UNIT/ML FlexPen Inject 25-35 units 3 times daily with meals (Patient taking differently: Inject 10 Units into the skin 3 (three) times daily with meals. ) 30 mL 5  . insulin detemir (LEVEMIR) 100 UNIT/ML injection Inject 0.75 mLs (75 Units total) into the skin at bedtime. (Patient taking differently: Inject 60 Units into the skin at bedtime. ) 80 mL 5  . lansoprazole (PREVACID) 30 MG capsule Take 30 mg by mouth every morning.     . Levothyroxine Sodium 25 MCG CAPS Take 1 capsule (25 mcg total) by mouth daily before breakfast. 30 capsule 5  . lisinopril (PRINIVIL,ZESTRIL) 10 MG tablet Take 1 tablet (10 mg total) by mouth daily. 90 tablet 1  . loratadine (CLARITIN) 10 MG tablet Take 10 mg by mouth every morning.     . metFORMIN (GLUCOPHAGE-XR) 500 MG 24  hr tablet Take 1 tablet (500 mg total) by mouth 2 (two) times daily with a meal. 60 tablet 11  . methocarbamol (ROBAXIN) 750 MG tablet Take 750 mg by mouth 2 (two) times daily. *May take one tablet every 6 hours*    . Omega-3 Fatty Acids (FISH OIL CONCENTRATE) 1000 MG CAPS Take 1 capsule (1,000 mg total) by mouth every morning for 30 days. 30 capsule 3  . topiramate (TOPAMAX) 50 MG tablet Take 1 tablet (50 mg total) by mouth at bedtime. 30 tablet 6   No current facility-administered medications for this visit.     Allergies as of 12/02/2018 - Review Complete 11/26/2018  Allergen Reaction Noted  . Rosuvastatin Other (See Comments) 11/06/2018  . Toradol [ketorolac tromethamine] Itching 06/30/2012    Vitals: There were no vitals taken for this visit. Last Weight:   Wt Readings from Last 1 Encounters:  11/04/18 228 lb 9.6 oz (103.7 kg)   LKG:MWNUUBMI:There is no height or weight on file to calculate BMI.       Last Height:   Ht Readings from Last 1 Encounters:  11/04/18 6' (1.829 m)   [name] indicated that his weight  is 213 pounds at a height of 6 foot.  This would correspond to a body mass index of 29 kg/m.  Observation   General: The patient is awake, alert and appears not in acute distress. The patient is well groomed. Head: Normocephalic. Neck is supple. Mallampati 3,  neck circumference:18.5" Nasal airflow patent   Retrognathia is seen. Irregular dentition. Crowded jaw.   Respiratory: patient is able to hold his breath for 20 seconds.   . Skin:  Without evidence of facial edema, or rash Trunk: The patient's posture is erect  Neurologic exam : The patient is awake and alert, oriented to place and time.   Memory subjective  described as intact. Attention span & concentration ability appears normal.  Speech is fluent,  without dysarthria, dysphonia or aphasia.  Mood and affect are appropriate.  Cranial nerves: Pupils are equal in size,  Extraocular movements  in vertical and  horizontal planes intact, no ptosis.  Facial motor strength is symmetric and tongue and uvula move midline. Shoulder shrug was symmetrical.   Motor exam:  Symmetric muscle bulk and symmetric ROM in all extremities.  Coordination: Rapid alternating movements  normal without evidence of ataxia, dysmetria or tremor.  Gait and station: Patient reportedly walks without assistive device.  Assessment and Plan: Mr. Morten endorses headaches among other painful conditions affecting his sleep.  He wakes up with headaches frequently and sometimes he is woken by headaches.  Chronic pain has been the cause of his insomnia all along.  The question is if apnea plays a role in his headache development.  He is not sure that he snores there is nobody observing him while sleeping.  Some of his nocturnal arousals cannot be attributed to pain alone and are not caused by nocturia either.  He does often have a dry mouth.  His recent emergency room visit on 20 March and it was a diagnosis of TIA after no stroke could be found yet he had a lacunar stroke in the remote past.  All his comorbidities including diabetes mellitus, hypertension, and a family history of heart disease make him also prone to have obstructive sleep apnea.    Interestingly, he had undergone a attended polysomnography which resulted in a borderline apnea finding about 10 maybe 12 years ago.  This was done at any [facility] in [place].  At the time he was 250 pounds heavy and he has lost 37 pounds since it would be interesting to see if his apnea is still borderline given that he has treated the most eminent risk factor of all, obesity.  He is willing to pick up a home sleep test device to be tested at home and to return the machine the following business day.  I will be placing this patient on the list for a home sleep test to be made available.   Follow Up Instructions: HST ordered and follow up depends on the findings and therapies initiated.     I discussed the assessment and treatment plan with the patient. The patient was provided an opportunity to ask questions and all were answered. The patient agreed with the plan and demonstrated an understanding of the instructions.   The patient was advised to call back or seek an in-person evaluation if the symptoms worsen or if the condition fails to improve as anticipated.  I provided 25 minutes of non-face-to-face time during this encounter.  RV after HST with NP.  Melvyn Novas, MD 12/02/2018, 8:38 AM  Certified in Neurology by  ABPN Certified in [place] by [facility] [address] [place], [place] [address]

## 2018-12-02 NOTE — Patient Instructions (Signed)

## 2018-12-03 ENCOUNTER — Encounter: Payer: Self-pay | Admitting: Family Medicine

## 2018-12-08 ENCOUNTER — Encounter: Payer: Self-pay | Admitting: Family Medicine

## 2018-12-09 ENCOUNTER — Encounter: Payer: Self-pay | Admitting: Neurology

## 2018-12-11 ENCOUNTER — Encounter: Payer: Self-pay | Admitting: Family Medicine

## 2018-12-12 MED ORDER — LEVOTHYROXINE SODIUM 25 MCG PO TABS: 25.0000 ug | ORAL_TABLET | Freq: Every day | ORAL | 1 refills | Status: DC

## 2019-01-12 ENCOUNTER — Other Ambulatory Visit: Payer: Self-pay

## 2019-01-12 ENCOUNTER — Ambulatory Visit (INDEPENDENT_AMBULATORY_CARE_PROVIDER_SITE_OTHER): Payer: Medicare HMO | Admitting: Neurology

## 2019-01-12 DIAGNOSIS — M2619 Other specified anomalies of jaw-cranial base relationship: Secondary | ICD-10-CM

## 2019-01-12 DIAGNOSIS — G4701 Insomnia due to medical condition: Secondary | ICD-10-CM

## 2019-01-12 DIAGNOSIS — IMO0001 Reserved for inherently not codable concepts without codable children: Secondary | ICD-10-CM

## 2019-01-12 DIAGNOSIS — R519 Headache, unspecified: Secondary | ICD-10-CM

## 2019-01-12 DIAGNOSIS — G459 Transient cerebral ischemic attack, unspecified: Secondary | ICD-10-CM

## 2019-01-12 DIAGNOSIS — G4710 Hypersomnia, unspecified: Secondary | ICD-10-CM

## 2019-01-12 DIAGNOSIS — I10 Essential (primary) hypertension: Secondary | ICD-10-CM

## 2019-01-12 DIAGNOSIS — E785 Hyperlipidemia, unspecified: Secondary | ICD-10-CM

## 2019-01-14 NOTE — Progress Notes (Signed)
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  No prolonged hypoxia, mild to moderate snoring and no tachy-bradycardia.    Recommendations:      It is an option to use CPAP in the treatment of such mild degree of apnea with strong REM sleep dependence. I will be happy to prescribe an autotitration device ( not dental therapy, given the REM sleep accentuation) .  It is unlikely that OSA would cause or contribute significantly to the patient's headaches.    Electronically Signed: Larey Seat, MD   01-19-2019

## 2019-01-16 ENCOUNTER — Encounter: Payer: Self-pay | Admitting: Neurology

## 2019-01-19 ENCOUNTER — Telehealth: Payer: Self-pay | Admitting: Neurology

## 2019-01-19 ENCOUNTER — Encounter: Payer: Self-pay | Admitting: Neurology

## 2019-01-19 NOTE — Procedures (Signed)
Patient Information     First Name: Sean MostCharles Last Name: Sean Horn ID: [id number]  Birth Date: Jul 09, 1961 Age: [age] Gender: Male  Referring Provider: Naomie DeanAntonia Ahern, MD BMI: 29.0 (W=214 lb, H=6' 0'')  Neck Circ.:  18.5 '' Epworth:  13/24   Sleep Study Information    Study Date: Jan 13, 2019 S/H/A Version: 5.1.77.7 / 4.1.1528 / 777  History: Sean Horn is a 58 year old male patient who has been seen by my colleague Sean DeanAntonia Horn .About 10 years or longer ago he had undergone a sleep study and he reports that his sleep has been nonrestorative and non-refreshing he wakes up frequently about every 2 hours but he is not sure if he snores.  Often he wakes with a bad headache in the morning and he naps during the day.  There is no photo or phonophobia and the surgery to the neck did not help with the headaches.  He also was referred to physical therapy but did not follow.  The headache has occurred almost daily this was the reason why Dr. Lucia GaskinsAhern saw the patient that she refers to me based on the most recently acquired diagnosis of TIA. He had seen Sean Horn for cervical spine trigger point/ dry needle procedures and these were meant to help with occipital headaches.  He also had been referred to physical therapy.  He has continued to be taking gabapentin for pain.  A CT of the cervical spine had shown multilevel degenerative changes and he underwent a surgery in June 2019 with a discectomy at C4-C5- C6.  He had an emergency room visit just on 24 October 2018 when he reported numbness, difficulty speaking and headaches-he was diagnosed as a TIA was started on aspirin daily and Topiramate.  The headaches have still continued and affecting both of the head a dull headache that seems to be worst in the temple bilaterally.       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  No prolonged hypoxia, mild to moderate snoring and no  tachy-bradycardia.    Recommendations:      It is an option to use CPAP in the treatment of such mild degree of apnea with strong REM sleep dependence. I will be happy to prescribe an autotitration device ( not dental therapy, given the REM sleep accentuation) .  It is unlikely that OSA would cause or contribute significantly to the patient's headaches.    Electronically Signed: Melvyn Novasarmen Raileigh Sabater, MD   01-19-2019            Sleep Summary  Oxygen Saturation Statistics   Start Study Time: End Study Time: Total Recording Time:  12:05:27 AM    7:38:31 AM   7 h, 33 min  Total Sleep Time % REM of Sleep Time:  5 h, 20 min  22.8    Mean: 96 Minimum: 92 Maximum: 99  Mean of Desaturations Nadirs (%):   94  Oxygen Desaturation %: 4-9 10-20 >20 Total  Events Number Total  15 100.0  0 0.0  0 0.0  15 100.0  Oxygen Saturation: <90 <=88 <85 <80 <70  Duration (minutes): Sleep % 0.0 0.0 0.0 0.0 0.0 0.0 0.0 0.0 0.0 0.0     Respiratory Indices      Total Events REM NREM All Night  pRDI:  58  pAHI:  52 ODI:  15  pAHIc:  0  % CSR: 0.0 31.5 30.7 10.8 0.0 4.9 3.7 0.5 0.0 10.9 9.8 2.8 0.0       Pulse Rate Statistics during Sleep (BPM)      Mean: 88 Minimum: 71 Maximum: 103    Indices are calculated using technically valid sleep time of  5 hrs, 19 min. Central-Indices are calculated using technically valid sleep time of  5  hrs, 15 min. pRDI/pAHI are calculated using oxi desaturations ? 3%  Body Position Statistics  Position Supine Prone Right Left Non-Supine  Sleep (min) 82.1 34.5 16.0 186.5 237.0  Sleep % 25.6 10.8 5.0 58.3 74.1  pRDI 6.6 15.7 56.4 8.1 12.5  pAHI 5.9 14.0 56.4 6.8 11.2  ODI 0.7 5.3 15.0 2.3 3.6     Snoring Statistics Snoring Level (dB) >40 >50 >60 >70 >80 >Threshold (45)  Sleep (min) 119.1 4.8 1.0 0.2 0.0 9.4  Sleep % 37.2 1.5 0.3 0.1 0.0 2.9    Mean: 41 dB Sleep Stages Chart               pAHI=9.8                           Mild               Moderate                    Severe                                                 5              15                    30

## 2019-01-19 NOTE — Addendum Note (Signed)
Addended by: Larey Seat on: 01/19/2019 05:46 PM   Modules accepted: Orders

## 2019-01-19 NOTE — Telephone Encounter (Signed)
Patient reached out in reference to his SSR. Advised that I would make Dr Brett Fairy that he is asking and as soon as she reads the results I will contact him with results.

## 2019-01-20 NOTE — Telephone Encounter (Signed)
His sleep study was read.

## 2019-01-23 DIAGNOSIS — G4733 Obstructive sleep apnea (adult) (pediatric): Secondary | ICD-10-CM | POA: Diagnosis not present

## 2019-02-03 ENCOUNTER — Other Ambulatory Visit: Payer: Self-pay | Admitting: Family Medicine

## 2019-02-03 DIAGNOSIS — F419 Anxiety disorder, unspecified: Secondary | ICD-10-CM

## 2019-02-22 DIAGNOSIS — G4733 Obstructive sleep apnea (adult) (pediatric): Secondary | ICD-10-CM | POA: Diagnosis not present

## 2019-03-25 DIAGNOSIS — G4733 Obstructive sleep apnea (adult) (pediatric): Secondary | ICD-10-CM | POA: Diagnosis not present

## 2019-04-06 ENCOUNTER — Encounter: Payer: Self-pay | Admitting: Family Medicine

## 2019-04-21 ENCOUNTER — Ambulatory Visit: Payer: Medicare HMO | Admitting: Family Medicine

## 2019-04-25 DIAGNOSIS — G4733 Obstructive sleep apnea (adult) (pediatric): Secondary | ICD-10-CM | POA: Diagnosis not present

## 2019-05-04 ENCOUNTER — Other Ambulatory Visit: Payer: Self-pay | Admitting: Family Medicine

## 2019-05-04 ENCOUNTER — Other Ambulatory Visit: Payer: Self-pay | Admitting: Internal Medicine

## 2019-05-04 DIAGNOSIS — F419 Anxiety disorder, unspecified: Secondary | ICD-10-CM

## 2019-05-05 ENCOUNTER — Other Ambulatory Visit: Payer: Self-pay | Admitting: *Deleted

## 2019-05-05 MED ORDER — ATORVASTATIN CALCIUM 10 MG PO TABS: 5.0000 mg | ORAL_TABLET | ORAL | 0 refills | Status: DC

## 2019-05-12 NOTE — Progress Notes (Signed)
[facility] [address], [address] [place], [place] [address] [phone] [phone]  Date:  05/14/2019   Name:  Sean KneeCharles W Horn   DOB:  04/15/1961   MRN:  [id number]  PCP:  Bradd CanaryBlyth, Stacey A, MD    Chief Complaint: Shoulder Pain (right shoulder, toruble sleeping, no known injury) and Flu Vaccine   History of Present Illness:  Sean Horn is a 58 y.o. very pleasant male patient who presents with the following:  Pt of Dr Abner GreenspanBlyth here today with concern of shoulder pain  I have seen this patient in the past, in February 2019 History of DM, HTN, TIA, hyperlipidemia  Lab Results  Component Value Date   HGBA1C 8.8 (H) 11/03/2018   Flu shot due- given today  Eye exam: Colon cancer screening He is seeing Dr Elvera LennoxGherghe for his DM -plans to schedule an appoint with her soon  His main concern today is right shoulder pain for several months- perhaps 7-8 months.  Getting worse It wakes him up at night Difficult to flex the arm, cannot reach behind himself really at all NKI, getting steadily worse He has had left shoulder operation in the past - rotator cuff per Dr [place]o AppleHarrison in [place] He would like to go back and see Dr. Romeo AppleHarrison again for his right shoulder  He is retired from his work as a Games developerdiesel mechanic and he did sports as a child as well -he thinks this may be how he damaged his shoulder  Otherwise feeling well  Patient Active Problem List   Diagnosis Date Noted  . Morning headache 12/02/2018  . 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 Retrognathia 12/02/2018  . TIA (transient ischemic attack) 11/03/2018  . Cervical stenosis of spine 01/16/2018  . Hyperlipidemia 07/19/2017  . Obesity 01/30/2017  . Urinary frequency 12/11/2016  . Insomnia 09/13/2016  . History of chicken pox   . History of shingles   . Preventative health care 08/14/2013  . Anxiety   . History of kidney stones   . GERD (gastroesophageal reflux  disease)   . Type 2 diabetes mellitus with complication, with long-term current use of insulin (HCC)   . Hypertension   . Osteoarthritis   . 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 Anxiety   . Chronic headaches   . Chronic neck pain   . GERD (gastroesophageal reflux disease)    occasional - OTC as needed  . History of chicken pox   . History of kidney stones   . History of shingles   . Hypertension    under control with med., had been on med. since age 89  . Insomnia 09/13/2016  . Insulin dependent diabetes mellitus    Follows with endocrinology, Dr Chestine Sporelark reports his last hgba1c was 7.3   . Non-insulin dependent type 2 diabetes mellitus (HCC)   . Obesity 01/30/2017  . Occipital neuralgia   . Osteoarthritis    right Horn  . PONV (postoperative nausea and vomiting)   . PONV (postoperative nausea and vomiting)   . Preventative health care 08/14/2013  . Tachycardia   . Urinary frequency 12/11/2016    Past Surgical History:  Procedure Laterality Date  . ANTERIOR CERVICAL DECOMP/DISCECTOMY FUSION N/A 01/16/2018   Procedure: ANTERIOR CERVICAL DECOMPRESSION/DISCECTOMY FUSION CERVICAL FOUR-FIVE, CERVICAL FIVE-SIX;  Surgeon: Lisbeth RenshawNundkumar, Neelesh, MD;  Location: [facility];  Service: Neurosurgery;  Laterality:  N/A;  ANTERIOR CERVICAL DECOMPRESSION/DISCECTOMY FUSION CERVICAL FOUR-FIVE, CERVICAL FIVE-SIX  . CHOLECYSTECTOMY  1980's  . CYSTOSCOPY/RETROGRADE/URETEROSCOPY/STONE EXTRACTION WITH BASKET Right 08/27/2003   and double J stent placement  . INGUINAL HERNIA REPAIR Left 1990's  . Horn ARTHROSCOPY Left 1980's  . Horn ARTHROSCOPY Right 2013  . Horn ARTHROSCOPY Right 06/30/2013   Procedure: RIGHT Horn ARTHROSCOPY AND SYNOVECTOMY;  Surgeon: Velna Ochs, MD;  Location: [facility];  Service: Orthopedics;  Laterality: Right;  . LITHOTRIPSY     multiple, b/l  . SHOULDER ADHESION RELEASE Left ` 1998  . SHOULDER ARTHROSCOPY W/ ROTATOR  CUFF REPAIR Left 10/20/2004   x 4, had to have part of clavile removed. torn rotator cuff multiple times, had to place screws   . SHOULDER ARTHROSCOPY W/ SUPERIOR LABRAL ANTERIOR POSTERIOR LESION REPAIR Left 12/22/2004  . TOTAL Horn ARTHROPLASTY Right 12/23/2012   x3, 2 arthroscopies and a TKR,  TOTAL Horn ARTHROPLASTY;  Surgeon: Velna Ochs, MD;  Location: [facility] OR;  Service: Orthopedics;  Laterality: Right;  DEPUY, RNFA  . WRIST SURGERY Right    x 3, torn ligaments, pins placed then infected, then fused with plate    Social History   Tobacco Use  . Smoking status: Never Smoker  . Smokeless tobacco: Current User    Types: Snuff  Substance Use Topics  . Alcohol use: No    Frequency: Never  . Drug use: No    Family History  Problem Relation Age of Onset  . COPD Mother        smoker  . Hypertension Mother   . Heart disease Mother        CHF  . Diabetes Father   . Heart disease Father        quintuple bypass  . Stroke Father   . Hypertension Father   . Peripheral vascular disease Father   . Hypertension Son        tachycardia  . Heart disease Maternal Grandfather   . Arthritis Paternal Grandmother   . Colon cancer Neg Hx   . Rectal cancer Neg Hx   . Stomach cancer Neg Hx     Allergies  Allergen Reactions  . Rosuvastatin Other (See Comments)  . Toradol [Ketorolac Tromethamine] Itching    Medication list has been reviewed and updated.  Current Outpatient Medications on File Prior to Visit  Medication Sig Dispense Refill  . atorvastatin (LIPITOR) 10 MG tablet Take 0.5 tablets (5 mg total) by mouth every other day. 45 tablet 0  . carvedilol (COREG) 12.5 MG tablet Take 1 tablet (12.5 mg total) by mouth 2 (two) times daily with a meal. 180 tablet 1  . Coenzyme Q10 (COQ-10) 75 MG CAPS Take 75 mg by mouth daily.  0  . Dulaglutide (TRULICITY) 1.5 MG/0.5ML SOPN Inject 1.5 mg into the skin once a week. NEED APPOINTMENT FOR FURTHER REFILLS 12 mL 0  . escitalopram (LEXAPRO) 20  MG tablet Take 1 tablet by mouth once daily 90 tablet 0  . EUTHYROX 25 MCG tablet TAKE 1 TABLET BY MOUTH ONCE DAILY BEFORE BREAKFAST 90 tablet 0  . insulin aspart (NOVOLOG FLEXPEN) 100 UNIT/ML FlexPen Inject 25-35 units 3 times daily with meals (Patient taking differently: Inject 10 Units into the skin 3 (three) times daily with meals. ) 30 mL 5  . insulin detemir (LEVEMIR) 100 UNIT/ML injection Inject 0.75 mLs (75 Units total) into the skin at bedtime. (Patient taking differently: Inject 60 Units into the skin at bedtime. )  80 mL 5  . lansoprazole (PREVACID) 30 MG capsule Take 30 mg by mouth every morning.     Marland Kitchen lisinopril (PRINIVIL,ZESTRIL) 10 MG tablet Take 1 tablet (10 mg total) by mouth daily. 90 tablet 1  . loratadine (CLARITIN) 10 MG tablet Take 10 mg by mouth every morning.     . metFORMIN (GLUCOPHAGE-XR) 500 MG 24 hr tablet Take 1 tablet (500 mg total) by mouth 2 (two) times daily with a meal. 60 tablet 11  . topiramate (TOPAMAX) 50 MG tablet Take 1 tablet (50 mg total) by mouth at bedtime. 30 tablet 6   No current facility-administered medications on file prior to visit.     Review of Systems:  As per HPI- otherwise negative.    Physical Examination: Vitals:   05/14/19 1047  BP: (!) 126/96  Pulse: 86  Resp: 16  Temp: (!) 96.3 F (35.7 C)  SpO2: 99%   Vitals:   05/14/19 1047  Weight: 221 lb (100.2 kg)  Height: 6' (1.829 m)   Body mass index is 29.97 kg/m. Ideal Body Weight: Weight in (lb) to have BMI = 25: 183.9  GEN: WDWN, NAD, Non-toxic, A & O x 3, overweight, looks well HEENT: Atraumatic, Normocephalic. Neck supple. No masses, No LAD. Ears and Nose: No external deformity. CV: RRR, No M/G/R. No JVD. No thrill. No extra heart sounds. PULM: CTA B, no wheezes, crackles, rhonchi. No retractions. No resp. distress. No accessory muscle use. EXTR: No c/c/e NEURO Normal gait.  PSYCH: Normally interactive. Conversant. Not depressed or anxious appearing.  Calm demeanor.   Right shoulder: Patient has poor range of motion, he can flex and abduct about 90 degrees.  Any external rotation is very painful, he is able to touch his hand to his back in internal rotation but this is also painful.  Supraspinatus weakness is present.  Deltoid, biceps, triceps strength is normal  Assessment and Plan: Chronic right shoulder pain - Plan: Ambulatory referral to Orthopedic Surgery, traMADol (ULTRAM) 50 MG tablet  Here today with concern of right shoulder pain.  He has significant decrease in range of motion and also displays weakness.  I suspect he has at least a partial-thickness rotator cuff tear.  Referral back to his orthopedist for evaluation Flu shot given Gave tramadol to use for pain as needed Advised him that a good glucose control will speed his postop healing.  He plans to see his endocrinologist as soon as possible  Signed Lamar Blinks, MD

## 2019-05-14 ENCOUNTER — Encounter: Payer: Self-pay | Admitting: Internal Medicine

## 2019-05-14 ENCOUNTER — Ambulatory Visit (INDEPENDENT_AMBULATORY_CARE_PROVIDER_SITE_OTHER): Payer: Medicare HMO | Admitting: Family Medicine

## 2019-05-14 ENCOUNTER — Other Ambulatory Visit: Payer: Self-pay

## 2019-05-14 ENCOUNTER — Encounter: Payer: Self-pay | Admitting: Family Medicine

## 2019-05-14 VITALS — BP 126/96 | HR 86 | Temp 96.3°F | Resp 16 | Ht 72.0 in | Wt 221.0 lb

## 2019-05-14 DIAGNOSIS — Z23 Encounter for immunization: Secondary | ICD-10-CM | POA: Diagnosis not present

## 2019-05-14 DIAGNOSIS — G8929 Other chronic pain: Secondary | ICD-10-CM

## 2019-05-14 DIAGNOSIS — M25511 Pain in right shoulder: Secondary | ICD-10-CM | POA: Diagnosis not present

## 2019-05-14 MED ORDER — TRAMADOL HCL 50 MG PO TABS: 50.0000 mg | ORAL_TABLET | Freq: Three times a day (TID) | ORAL | 0 refills | Status: DC | PRN

## 2019-05-14 NOTE — Patient Instructions (Signed)
It was good to see you today, I am sorry your shoulder so painful. We will place referral to Dr. Aline Brochure in [place], but certainly go ahead and call him yourself this week if you like  Address: [address] [place], [place] [address] Phone: [phone]  Ice, gentle range of motion exercises, and tramadol may help with your pain for now.  The tramadol is a mild narcotic, so use it as infrequently as you can.  It may make you drowsy  Dr. Aline Brochure will likely want your diabetes under good control prior to surgery.  This will help you heal better.  Please help with Dr. Cruzita Lederer ASAP

## 2019-05-17 ENCOUNTER — Other Ambulatory Visit: Payer: Self-pay | Admitting: Family Medicine

## 2019-05-17 DIAGNOSIS — I10 Essential (primary) hypertension: Secondary | ICD-10-CM

## 2019-05-18 NOTE — Telephone Encounter (Signed)
Pt was sent my chart message. F/U was due 12/2018. Pt was asked to call and schedule appt and to let us know if he needed a short supply sent to pharmacy to last until that appt

## 2019-05-19 ENCOUNTER — Ambulatory Visit (INDEPENDENT_AMBULATORY_CARE_PROVIDER_SITE_OTHER): Payer: Medicare HMO | Admitting: Internal Medicine

## 2019-05-19 ENCOUNTER — Telehealth: Payer: Self-pay

## 2019-05-19 ENCOUNTER — Encounter: Payer: Self-pay | Admitting: Internal Medicine

## 2019-05-19 ENCOUNTER — Other Ambulatory Visit: Payer: Self-pay

## 2019-05-19 VITALS — BP 120/78 | HR 88 | Ht 72.0 in | Wt 225.0 lb

## 2019-05-19 DIAGNOSIS — E785 Hyperlipidemia, unspecified: Secondary | ICD-10-CM

## 2019-05-19 DIAGNOSIS — Z794 Long term (current) use of insulin: Secondary | ICD-10-CM | POA: Diagnosis not present

## 2019-05-19 DIAGNOSIS — E669 Obesity, unspecified: Secondary | ICD-10-CM | POA: Diagnosis not present

## 2019-05-19 DIAGNOSIS — E118 Type 2 diabetes mellitus with unspecified complications: Secondary | ICD-10-CM

## 2019-05-19 LAB — POCT GLYCOSYLATED HEMOGLOBIN (HGB A1C): Hemoglobin A1C: 8.7 % — AB (ref 4.0–5.6)

## 2019-05-19 MED ORDER — FREESTYLE LIBRE 14 DAY SENSOR MISC: 1.0000 | 11 refills | Status: DC

## 2019-05-19 MED ORDER — FREESTYLE LIBRE 14 DAY READER DEVI: 1.0000 | Freq: Once | 1 refills | Status: AC

## 2019-05-19 NOTE — Addendum Note (Signed)
Addended by: Cardell Peach I on: 05/19/2019 09:51 AM   Modules accepted: Orders

## 2019-05-19 NOTE — Progress Notes (Signed)
Patient ID: Sean Horn, male   DOB: 04-04-1961, 58 y.o.   MRN: [id number]   HPI: Sean Horn is a 58 y.o.-year-old male, returning for follow-up for DM2, dx in 2013, insulin-dependent since 2017, uncontrolled, without long-term complications. He was previously seen by Dr. Chestine Spore.  Last visit 1 year ago.  He will have rotator cuff surgery soon.  Last hemoglobin A1c was: Lab Results  Component Value Date   HGBA1C 8.8 (H) 11/03/2018   HGBA1C 12.2 (A) 07/01/2018   HGBA1C 8.9 (H) 01/13/2018   At last visit, he was on: -  >> diarrhea  - stopped 2 mo ago - Trulicity 1.5 mg weekly - started 07/2017 - off for 3 mo, restarted it 1 mo ago - Levemir 60 units at bedtime  - Humalog - misses lunchtime insulin frequently: 20 units for a smaller meal 25 units for a regular meal   We changed to: - Metformin ER 500 mg 2x a day >> no diarrhea - Trulicity 1.5 mg weekly in am - Levemir 75 >> 90 units at bedtime - Humalog: 25 units for a smaller meal 30 units for a regular meal 35 units for a larger meal   Pt checks his sugars once a day: - am: 160-300s, 400 >> 298-400 >> 180-330 >> 60, 130-140, 160 - 2h after b'fast: n/c >> 336 >> n/c  - before lunch: 80 x1  >> >600 >> n/c >> 60 x1 - 2h after lunch: n/c - before dinner: n/c - 2h after dinner: n/c - bedtime: 200-300s >> n/c - nighttime: n/c Lowest sugar was 80 >> 200s >> 180 >> 60 x2; he has hypoglycemia awareness in the 80s. Highest sugar was 400s >> HI >> 350 >> 200s.  Glucometer: One Touch Ultra  Pt's meals are: - Breakfast: cereal: cheerios + 2% milk - Lunch: sandwich  - Dinner: meat + veggies and less starches - Snacks: after dinner - nuts; granola; sugar-free cookies,  Midnight snack: e.g. banana   -No CKD, last BUN/creatinine:  Lab Results  Component Value Date   BUN 13 11/03/2018   BUN 8 01/13/2018   CREATININE 0.86 11/03/2018   CREATININE 0.80 01/13/2018  On lisinopril 10. -+ HL; last set of lipids: Lab Results   Component Value Date   CHOL 216 (H) 11/03/2018   HDL 34 (L) 11/03/2018   LDLCALC UNABLE TO CALCULATE IF TRIGLYCERIDE OVER 400 mg/dL 08/65/7846   LDLDIRECT 128.0 12/11/2016   TRIG 567 (H) 11/03/2018   CHOLHDL 6.4 11/03/2018   On Crestor 10 - last eye exam was in 10/2017: No DR - no numbness and tingling in his feet.  He has family history of diabetes in father.  ROS: Constitutional: no weight gain/no weight loss, no fatigue, no subjective hyperthermia, no subjective hypothermia Eyes: no blurry vision, no xerophthalmia ENT: no sore throat, no nodules palpated in neck, no dysphagia, no odynophagia, no hoarseness Cardiovascular: no CP/no SOB/no palpitations/no leg swelling Respiratory: no cough/no SOB/no wheezing Gastrointestinal: no N/no V/no D/no C/no acid reflux Musculoskeletal: no muscle aches/+ joint aches- shoulder Skin: no rashes, no hair loss Neurological: no tremors/no numbness/no tingling/no dizziness  I reviewed pt's medications, allergies, PMH, social hx, family hx, and changes were documented in the history of present illness. Otherwise, unchanged from my initial visit note.  Past Medical History:  Diagnosis Date  . Anxiety   . Chronic headaches   . Chronic neck pain   . GERD (gastroesophageal reflux disease)    occasional - OTC  as needed  . History of chicken pox   . History of kidney stones   . History of shingles   . Hypertension    under control with med., had been on med. since age 58  . Insomnia 09/13/2016  . Insulin dependent diabetes mellitus    Follows with endocrinology, Dr Chestine Sporelark reports his last hgba1c was 7.3   . Non-insulin dependent type 2 diabetes mellitus (HCC)   . Obesity 01/30/2017  . Occipital neuralgia   . Osteoarthritis    right knee  . PONV (postoperative nausea and vomiting)   . PONV (postoperative nausea and vomiting)   . Preventative health care 08/14/2013  . Tachycardia   . Urinary frequency 12/11/2016   Past Surgical History:   Procedure Laterality Date  . ANTERIOR CERVICAL DECOMP/DISCECTOMY FUSION N/A 01/16/2018   Procedure: ANTERIOR CERVICAL DECOMPRESSION/DISCECTOMY FUSION CERVICAL FOUR-FIVE, CERVICAL FIVE-SIX;  Surgeon: Lisbeth RenshawNundkumar, Neelesh, MD;  Location: [facility] OR;  Service: Neurosurgery;  Laterality: N/A;  ANTERIOR CERVICAL DECOMPRESSION/DISCECTOMY FUSION CERVICAL FOUR-FIVE, CERVICAL FIVE-SIX  . CHOLECYSTECTOMY  1980's  . CYSTOSCOPY/RETROGRADE/URETEROSCOPY/STONE EXTRACTION WITH BASKET Right 08/27/2003   and double J stent placement  . INGUINAL HERNIA REPAIR Left 1990's  . KNEE ARTHROSCOPY Left 1980's  . KNEE ARTHROSCOPY Right 2013  . KNEE ARTHROSCOPY Right 06/30/2013   Procedure: RIGHT KNEE ARTHROSCOPY AND SYNOVECTOMY;  Surgeon: Velna OchsPeter G Dalldorf, MD;  Location: [facility];  Service: Orthopedics;  Laterality: Right;  . LITHOTRIPSY     multiple, b/l  . SHOULDER ADHESION RELEASE Left ` 1998  . SHOULDER ARTHROSCOPY W/ ROTATOR CUFF REPAIR Left 10/20/2004   x 4, had to have part of clavile removed. torn rotator cuff multiple times, had to place screws   . SHOULDER ARTHROSCOPY W/ SUPERIOR LABRAL ANTERIOR POSTERIOR LESION REPAIR Left 12/22/2004  . TOTAL KNEE ARTHROPLASTY Right 12/23/2012   x3, 2 arthroscopies and a TKR,  TOTAL KNEE ARTHROPLASTY;  Surgeon: Velna OchsPeter G Dalldorf, MD;  Location: [facility] OR;  Service: Orthopedics;  Laterality: Right;  DEPUY, RNFA  . WRIST SURGERY Right    x 3, torn ligaments, pins placed then infected, then fused with plate   Social History   Social History  . Marital status: Single    Spouse name: N/A  . Number of children: 2   Occupational History  . Retired, disability after his R knee sx.   Social History Main Topics  . Smoking status: Never Smoker  . Smokeless tobacco: Current User    Types: Snuff  . Alcohol use Yes     Comment: occasionally  . Drug use: No  . Sexual activity: Yes     Comment: currently applying for disability due to knee, lives with girfriend, no dietary  restrictions, previously Games developerdiesel mechanic   Current Outpatient Medications on File Prior to Visit  Medication Sig Dispense Refill  . atorvastatin (LIPITOR) 10 MG tablet Take 0.5 tablets (5 mg total) by mouth every other day. 45 tablet 0  . carvedilol (COREG) 12.5 MG tablet Take 1 tablet (12.5 mg total) by mouth 2 (two) times daily with a meal. 180 tablet 1  . Coenzyme Q10 (COQ-10) 75 MG CAPS Take 75 mg by mouth daily.  0  . Dulaglutide (TRULICITY) 1.5 MG/0.5ML SOPN Inject 1.5 mg into the skin once a week. NEED APPOINTMENT FOR FURTHER REFILLS 12 mL 0  . escitalopram (LEXAPRO) 20 MG tablet Take 1 tablet by mouth once daily 90 tablet 0  . EUTHYROX 25 MCG tablet TAKE 1 TABLET BY MOUTH ONCE DAILY  BEFORE BREAKFAST 90 tablet 0  . insulin aspart (NOVOLOG FLEXPEN) 100 UNIT/ML FlexPen Inject 25-35 units 3 times daily with meals (Patient taking differently: Inject 10 Units into the skin 3 (three) times daily with meals. ) 30 mL 5  . insulin detemir (LEVEMIR) 100 UNIT/ML injection Inject 0.75 mLs (75 Units total) into the skin at bedtime. (Patient taking differently: Inject 60 Units into the skin at bedtime. ) 80 mL 5  . lansoprazole (PREVACID) 30 MG capsule Take 30 mg by mouth every morning.     Marland Kitchen lisinopril (PRINIVIL,ZESTRIL) 10 MG tablet Take 1 tablet (10 mg total) by mouth daily. 90 tablet 1  . loratadine (CLARITIN) 10 MG tablet Take 10 mg by mouth every morning.     . metFORMIN (GLUCOPHAGE-XR) 500 MG 24 hr tablet Take 1 tablet (500 mg total) by mouth 2 (two) times daily with a meal. 60 tablet 11  . topiramate (TOPAMAX) 50 MG tablet Take 1 tablet (50 mg total) by mouth at bedtime. 30 tablet 6  . traMADol (ULTRAM) 50 MG tablet Take 1 tablet (50 mg total) by mouth every 8 (eight) hours as needed for up to 20 doses. 20 tablet 0   No current facility-administered medications on file prior to visit.    Allergies  Allergen Reactions  . Rosuvastatin Other (See Comments)  . Toradol [Ketorolac Tromethamine]  Itching   Family History  Problem Relation Age of Onset  . COPD Mother        smoker  . Hypertension Mother   . Heart disease Mother        CHF  . Diabetes Father   . Heart disease Father        quintuple bypass  . Stroke Father   . Hypertension Father   . Peripheral vascular disease Father   . Hypertension Son        tachycardia  . Heart disease Maternal Grandfather   . Arthritis Paternal Grandmother   . Colon cancer Neg Hx   . Rectal cancer Neg Hx   . Stomach cancer Neg Hx    PE: BP 120/78   Pulse 88   Ht 6' (1.829 m)   Wt 225 lb (102.1 kg)   SpO2 97%   BMI 30.52 kg/m  Wt Readings from Last 3 Encounters:  05/19/19 225 lb (102.1 kg)  05/14/19 221 lb (100.2 kg)  11/04/18 228 lb 9.6 oz (103.7 kg)   Constitutional: overweight, in NAD Eyes: PERRLA, EOMI, no exophthalmos ENT: moist mucous membranes, no thyromegaly, no cervical lymphadenopathy Cardiovascular: RRR, No MRG Respiratory: CTA B Gastrointestinal: abdomen soft, NT, ND, BS+ Musculoskeletal: no deformities, strength intact in all 4 Skin: moist, warm, no rashes Neurological: no tremor with outstretched hands, DTR normal in all 4  ASSESSMENT: 1. DM2, insulin-dependent, uncontrolled, without long term complications, but significant hyperglycemia  2. HL  3.  Obesity  PLAN:  1. Patient with history of uncontrolled diabetes, returning after long absence, of 1 year.  At last visit, HbA1c was very high, at 12.2%, however, latest HbA1c obtained in 10/2018 was better, at 8.8%. -At last visit, she was off metformin IR due to diarrhea so we started Metformin ER.  We continue to do weekly GLP-1 receptor agonist and we increased his Levemir and Humalog doses.  She was missing a lot of Humalog doses and we discussed about the importance of taking this consistently.  At that time, he was drinking a lot of Glucerna in the evening and I advised him to  stop since sugars were high throughout the night and in the  morning. -Unfortunately, we cannot use SGLT 2 inhibitors due to frequent urination.  We discussed at last visit about the potential use of U500 insulin though we did not start this. -At this visit, sugars are much better in the morning, however, he is still not checking later in the day.  I again strongly advised him to check.  He is interested in a freestyle libre CGM and I sent a prescription to the pharmacy. -Since sugars in the morning are almost at goal and today his HbA1c is still high: 8.7% (although slightly lower), we discussed that most likely his sugars are increasing throughout the day, along with his meals.  Therefore, I advised him to increase his Trulicity to the newly FDA approved dose of 3 mg weekly.  Given written prescription for this but I believe he runs out of his 1.5 mg dose, he will double on this. - I suggested to:  Patient Instructions  Please continue: - Metformin ER 500 mg 2x a day - Levemir 90 units at bedtime - Humalog: 25 units for a smaller meal 30 units for a regular meal 35 units for a larger meal  Please increase: - Trulicity to 3 mg weekly in am  Please return in 3-4 months with your sugar log.   - advised to check sugars at different times of the day - 4x a day, rotating check times - advised for yearly eye exams >> he is not UTD - UTD with flu shot - return to clinic in 3-4 months  2. HL  -Reviewed latest lipid panel: Triglycerides are very high LDL could not be calculated, HDL low Lab Results  Component Value Date   CHOL 216 (H) 11/03/2018   HDL 34 (L) 11/03/2018   LDLCALC UNABLE TO CALCULATE IF TRIGLYCERIDE OVER 400 mg/dL 12/45/8099   LDLDIRECT 128.0 12/11/2016   TRIG 567 (H) 11/03/2018   CHOLHDL 6.4 11/03/2018  -Continues Crestor without side effects  3.  Obesity  -Continue Trulicity which should also help with weight loss, we will increase the dose -At last visit, we stopped Glucerna >> he did - lost 6 lbs total since last OV (today  weight higher as he has heavy boots on)  Carlus Pavlov, MD PhD [facility]

## 2019-05-19 NOTE — Telephone Encounter (Signed)
Noted  

## 2019-05-19 NOTE — Telephone Encounter (Signed)
Libre CGM rejected by insurance.  Chart reviewed and based on patient not showing evidence of testing 4 times a day, a PA will be denied.

## 2019-05-19 NOTE — Patient Instructions (Addendum)
Please continue: - Metformin ER 500 mg 2x a day - Levemir 90 units at bedtime - Humalog: 25 units for a smaller meal 30 units for a regular meal 35 units for a larger meal  Please increase: - Trulicity to 3 mg weekly in am  Please return in 3-4 months with your sugar log.

## 2019-05-19 NOTE — Telephone Encounter (Signed)
It is ok.  I did tell him that he may need to pay out-of-pocket for this.

## 2019-05-24 ENCOUNTER — Encounter: Payer: Self-pay | Admitting: Family Medicine

## 2019-05-24 ENCOUNTER — Encounter: Payer: Self-pay | Admitting: Internal Medicine

## 2019-05-24 DIAGNOSIS — I10 Essential (primary) hypertension: Secondary | ICD-10-CM

## 2019-05-25 DIAGNOSIS — G4733 Obstructive sleep apnea (adult) (pediatric): Secondary | ICD-10-CM | POA: Diagnosis not present

## 2019-05-25 MED ORDER — LISINOPRIL 10 MG PO TABS: 10.0000 mg | ORAL_TABLET | Freq: Every day | ORAL | 1 refills | Status: DC

## 2019-05-29 ENCOUNTER — Ambulatory Visit: Payer: Medicare HMO

## 2019-05-29 ENCOUNTER — Ambulatory Visit: Payer: Medicare HMO | Admitting: Orthopedic Surgery

## 2019-05-29 ENCOUNTER — Encounter: Payer: Self-pay | Admitting: Orthopedic Surgery

## 2019-05-29 ENCOUNTER — Other Ambulatory Visit: Payer: Self-pay

## 2019-05-29 VITALS — BP 138/99 | HR 89 | Temp 97.0°F | Ht 72.0 in | Wt 219.0 lb

## 2019-05-29 DIAGNOSIS — M25511 Pain in right shoulder: Secondary | ICD-10-CM

## 2019-05-29 MED ORDER — HYDROCODONE-ACETAMINOPHEN 5-325 MG PO TABS: 1.0000 | ORAL_TABLET | Freq: Four times a day (QID) | ORAL | 0 refills | Status: DC | PRN

## 2019-05-29 NOTE — Patient Instructions (Addendum)
You have received an injection of steroids into the joint. 15% of patients will have increased pain within the 24 hours postinjection.   This is transient and will go away.   We recommend that you use ice packs on the injection site for 20 minutes every 2 hours and extra strength Tylenol 2 tablets every 8 as needed until the pain resolves.  If you continue to have pain after taking the Tylenol and using the ice please call the office for further instructions.   We will obtain pre-certification from the insurer and call you to schedule the study. Dr Aline Brochure will call you with the results     Shoulder Range of Motion Exercises Shoulder range of motion (ROM) exercises are done to keep the shoulder moving freely or to increase movement. They are often recommended for people who have shoulder pain or stiffness or who are recovering from a shoulder surgery. Phase 1 exercises When you are able, do this exercise 1-2 times per day for 30-60 seconds in each direction, or as directed by your health care provider. Pendulum exercise To do this exercise while sitting: 1. Sit in a chair or at the edge of your bed with your feet flat on the floor. 2. Let your affected arm hang down in front of you over the edge of the bed or chair. 3. Relax your shoulder, arm, and hand. [place] your body so your arm gently swings in small circles. You can also use your unaffected arm to start the motion. 5. Repeat changing the direction of the circles, swinging your arm left and right, and swinging your arm forward and back. To do this exercise while standing: 1. Stand next to a sturdy chair or table, and hold on to it with your hand on your unaffected side. 2. Bend forward at the waist. 3. Bend your knees slightly. 4. Relax your shoulder, arm, and hand. 5. While keeping your shoulder relaxed, use body motion to swing your arm in small circles. 6. Repeat changing the direction of the circles, swinging your arm left  and right, and swinging your arm forward and back. 7. Between exercises, stand up tall and take a short break to relax your lower back.  Phase 2 exercises Do these exercises 1-2 times per day or as told by your health care provider. Hold each stretch for 30 seconds, and repeat 3 times. Do the exercises with one or both arms as instructed by your health care provider. For these exercises, sit at a table with your hand and arm supported by the table. A chair that slides easily or has wheels can be helpful. External rotation 1. Turn your chair so that your affected side is nearest to the table. 2. Place your forearm on the table to your side. Bend your elbow about 90 at the elbow (right angle) and place your hand palm facing down on the table. Your elbow should be about 6 inches away from your side. 3. Keeping your arm on the table, lean your body forward. Abduction 1. Turn your chair so that your affected side is nearest to the table. 2. Place your forearm and hand on the table so that your thumb points toward the ceiling and your arm is straight out to your side. 3. Slide your hand out to the side and away from you, using your unaffected arm to do the work. 4. To increase the stretch, you can slide your chair away from the table. Flexion: forward stretch 1. Sit  facing the table. Place your hand and elbow on the table in front of you. 2. Slide your hand forward and away from you, using your unaffected arm to do the work. 3. To increase the stretch, you can slide your chair backward. Phase 3 exercises Do these exercises 1-2 times per day or as told by your health care provider. Hold each stretch for 30 seconds, and repeat 3 times. Do the exercises with one or both arms as instructed by your health care provider. Cross-body stretch: posterior capsule stretch 1. Lift your arm straight out in front of you. 2. Bend your arm 90 at the elbow (right angle) so your forearm moves across your  body. 3. Use your other arm to gently pull the elbow across your body, toward your other shoulder. Wall climbs 1. Stand with your affected arm extended out to the side with your hand resting on a door frame. 2. Slide your hand slowly up the door frame. 3. To increase the stretch, step through the door frame. Keep your body upright and do not lean. Wand exercises You will need a cane, a piece of PVC pipe, or a sturdy wooden dowel for wand exercises. Flexion To do this exercise while standing: 1. Hold the wand with both of your hands, palms down. 2. Using the other arm to help, lift your arms up and over your head, if able. 3. Push upward with your other arm to gently increase the stretch. To do this exercise while lying down: 1. Lie on your back with your elbows resting on the floor and the wand in both your hands. Your hands will be palm down, or pointing toward your feet. 2. Lift your hands toward the ceiling, using your unaffected arm to help if needed. 3. Bring your arms overhead as able, using your unaffected arm to help if needed. Internal rotation 1. Stand while holding the wand behind you with both hands. Your unaffected arm should be extended above your head with the arm of the affected side extended behind you at the level of your waist. The wand should be pointing straight up and down as you hold it. 2. Slowly pull the wand up behind your back by straightening the elbow of your unaffected arm and bending the elbow of your affected arm. External rotation 1. Lie on your back with your affected upper arm supported on a small pillow or rolled towel. When you first do this exercise, keep your upper arm close to your body. Over time, bring your arm up to a 90 angle out to the side. 2. Hold the wand across your stomach and with both hands palm up. Your elbow on your affected side should be bent at a 90 angle. 3. Use your unaffected side to help push your forearm away from you and toward  the floor. Keep your elbow on your affected side bent at a 90 angle. Contact a health care provider if you have:  New or increasing pain.  New numbness, tingling, weakness, or discoloration in your arm or hand. This information is not intended to replace advice given to you by your health care provider. Make sure you discuss any questions you have with your health care provider. Document Released: 04/21/2003 Document Revised: 09/04/2017 Document Reviewed: 09/04/2017 Elsevier Patient Education  2020 [employer].

## 2019-05-29 NOTE — Progress Notes (Signed)
Sean KneeCharles W Horn  05/29/2019  HISTORY SECTION :  Chief Complaint  Patient presents with  . Shoulder Pain    Rt shoulder for 6-7 mos NKI getting worse   58 year old male presents with a 7433-month history of painful right shoulder with no history of trauma complains of moderately severe pain decreased range of motion and weakness with global shoulder pain  He did get some tramadol from his family physician he tried ice heat shoulder exercises topical creams Tylenol Advil and Aleve with no relief   Review of Systems  Constitutional: Negative for chills, fever and weight loss.  Respiratory: Negative for shortness of breath.   Cardiovascular: Negative for chest pain.  Musculoskeletal: Positive for joint pain.  Neurological: Negative for tingling.     has a past medical history of Anxiety, Chronic headaches, Chronic neck pain, GERD (gastroesophageal reflux disease), History of chicken pox, History of kidney stones, History of shingles, Hypertension, Insomnia (09/13/2016), Insulin dependent diabetes mellitus, Non-insulin dependent type 2 diabetes mellitus (HCC), Obesity (01/30/2017), Occipital neuralgia, Osteoarthritis, PONV (postoperative nausea and vomiting), PONV (postoperative nausea and vomiting), Preventative health care (08/14/2013), Tachycardia, and Urinary frequency (12/11/2016).   Past Surgical History:  Procedure Laterality Date  . ANTERIOR CERVICAL DECOMP/DISCECTOMY FUSION N/A 01/16/2018   Procedure: ANTERIOR CERVICAL DECOMPRESSION/DISCECTOMY FUSION CERVICAL FOUR-FIVE, CERVICAL FIVE-SIX;  Surgeon: Lisbeth RenshawNundkumar, Neelesh, MD;  Location: [facility];  Service: Neurosurgery;  Laterality: N/A;  ANTERIOR CERVICAL DECOMPRESSION/DISCECTOMY FUSION CERVICAL FOUR-FIVE, CERVICAL FIVE-SIX  . CHOLECYSTECTOMY  1980's  . CYSTOSCOPY/RETROGRADE/URETEROSCOPY/STONE EXTRACTION WITH BASKET Right 08/27/2003   and double J stent placement  . INGUINAL HERNIA REPAIR Left 1990's  . Horn ARTHROSCOPY Left 1980's  . Horn  ARTHROSCOPY Right 2013  . Horn ARTHROSCOPY Right 06/30/2013   Procedure: RIGHT Horn ARTHROSCOPY AND SYNOVECTOMY;  Surgeon: Velna OchsPeter G Dalldorf, MD;  Location: [facility];  Service: Orthopedics;  Laterality: Right;  . LITHOTRIPSY     multiple, b/l  . SHOULDER ADHESION RELEASE Left ` 1998  . SHOULDER ARTHROSCOPY W/ ROTATOR CUFF REPAIR Left 10/20/2004   x 4, had to have part of clavile removed. torn rotator cuff multiple times, had to place screws   . SHOULDER ARTHROSCOPY W/ SUPERIOR LABRAL ANTERIOR POSTERIOR LESION REPAIR Left 12/22/2004  . TOTAL Horn ARTHROPLASTY Right 12/23/2012   x3, 2 arthroscopies and a TKR,  TOTAL Horn ARTHROPLASTY;  Surgeon: Velna OchsPeter G Dalldorf, MD;  Location: [facility] OR;  Service: Orthopedics;  Laterality: Right;  DEPUY, RNFA  . WRIST SURGERY Right    x 3, torn ligaments, pins placed then infected, then fused with plate    Body mass index is 29.7 kg/m.   Allergies  Allergen Reactions  . Rosuvastatin Other (See Comments)  . Toradol [Ketorolac Tromethamine] Itching     Current Outpatient Medications:  .  atorvastatin (LIPITOR) 10 MG tablet, Take 0.5 tablets (5 mg total) by mouth every other day., Disp: 45 tablet, Rfl: 0 .  carvedilol (COREG) 12.5 MG tablet, Take 1 tablet (12.5 mg total) by mouth 2 (two) times daily with a meal., Disp: 180 tablet, Rfl: 1 .  Coenzyme Q10 (COQ-10) 75 MG CAPS, Take 75 mg by mouth daily., Disp: , Rfl: 0 .  Continuous Blood Gluc Sensor (FREESTYLE LIBRE 14 DAY SENSOR) MISC, 1 each by Does not apply route every 14 (fourteen) days. Change every 2 weeks, Disp: 2 each, Rfl: 11 .  Dulaglutide (TRULICITY) 1.5 MG/0.5ML SOPN, Inject 1.5 mg into the skin once a week. NEED APPOINTMENT FOR FURTHER REFILLS, Disp: 12 mL, Rfl:  0 .  escitalopram (LEXAPRO) 20 MG tablet, Take 1 tablet by mouth once daily, Disp: 90 tablet, Rfl: 0 .  EUTHYROX 25 MCG tablet, TAKE 1 TABLET BY MOUTH ONCE DAILY BEFORE BREAKFAST, Disp: 90 tablet, Rfl: 0 .  insulin aspart  (NOVOLOG FLEXPEN) 100 UNIT/ML FlexPen, Inject 25-35 units 3 times daily with meals (Patient taking differently: Inject 10 Units into the skin 3 (three) times daily with meals. ), Disp: 30 mL, Rfl: 5 .  insulin detemir (LEVEMIR) 100 UNIT/ML injection, Inject 0.75 mLs (75 Units total) into the skin at bedtime. (Patient taking differently: Inject 60 Units into the skin at bedtime. ), Disp: 80 mL, Rfl: 5 .  lansoprazole (PREVACID) 30 MG capsule, Take 30 mg by mouth every morning. , Disp: , Rfl:  .  lisinopril (ZESTRIL) 10 MG tablet, Take 1 tablet (10 mg total) by mouth daily., Disp: 90 tablet, Rfl: 1 .  loratadine (CLARITIN) 10 MG tablet, Take 10 mg by mouth every morning. , Disp: , Rfl:  .  metFORMIN (GLUCOPHAGE-XR) 500 MG 24 hr tablet, Take 1 tablet (500 mg total) by mouth 2 (two) times daily with a meal., Disp: 60 tablet, Rfl: 11 .  topiramate (TOPAMAX) 50 MG tablet, Take 1 tablet (50 mg total) by mouth at bedtime., Disp: 30 tablet, Rfl: 6 .  traMADol (ULTRAM) 50 MG tablet, Take 1 tablet (50 mg total) by mouth every 8 (eight) hours as needed for up to 20 doses. (Patient not taking: Reported on 05/29/2019), Disp: 20 tablet, Rfl: 0   PHYSICAL EXAM SECTION: 1) BP (!) 138/99   Pulse 89   Temp (!) 97 F (36.1 C)   Ht 6' (1.829 m)   Wt 219 lb (99.3 kg)   BMI 29.70 kg/m   Body mass index is 29.7 kg/m. General appearance: Well-developed well-nourished no gross deformities  2) Cardiovascular normal pulse and perfusion in the upper extremities normal color without edema  3) Neurologically deep tendon reflexes are equal and normal, no sensation loss or deficits no pathologic reflexes  4) Psychological: Awake alert and oriented x3 mood and affect normal  5) Skin no lacerations or ulcerations no nodularity no palpable masses, no erythema or nodularity  6) Musculoskeletal: Left shoulder normal range of motion with mild crepitance no pain no tenderness to palpation skin normal strength and stability  normal  Right shoulder could only move the shoulder 80 degrees abduction 100 flexion is active motion was only 70 degrees abduction 70 flexion he has some mild external rotation tightness to 30 degrees with pain   MEDICAL DECISION SECTION:  Encounter Diagnosis  Name Primary?  . Acute pain of right shoulder Yes    Imaging See x-ray report he has tuberosity sclerosis on the greater tuberosity and acromion is normal except for proximal migration of the humerus suggesting chronic rotator cuff tear  Plan:  (Rx., Inj., surg., Frx, MRI/CT, XR:2)  Procedure note the subacromial injection shoulder RIGHT    Verbal consent was obtained to inject the  RIGHT   Shoulder  Timeout was completed to confirm the injection site is a subacromial space of the  RIGHT  shoulder   Medication used Depo-Medrol 40 mg and lidocaine 1% 3 cc  Anesthesia was provided by ethyl chloride  The injection was performed in the RIGHT  posterior subacromial space. After pinning the skin with alcohol and anesthetized the skin with ethyl chloride the subacromial space was injected using a 20-gauge needle. There were no complications  Sterile dressing was applied.  Inject right shoulder  Shoulder exercises  Pain medication  Meds ordered this encounter  Medications  . HYDROcodone-acetaminophen (NORCO/VICODIN) 5-325 MG tablet    Sig: Take 1 tablet by mouth every 6 (six) hours as needed for moderate pain.    Dispense:  30 tablet    Refill:  0   Call patient with results of MRI   11:22 AM Arther Abbott, MD  05/29/2019

## 2019-06-01 ENCOUNTER — Encounter: Payer: Self-pay | Admitting: Family Medicine

## 2019-06-01 MED ORDER — CARVEDILOL 12.5 MG PO TABS: 12.5000 mg | ORAL_TABLET | Freq: Two times a day (BID) | ORAL | 0 refills | Status: DC

## 2019-06-08 ENCOUNTER — Other Ambulatory Visit: Payer: Self-pay

## 2019-06-08 ENCOUNTER — Emergency Department (HOSPITAL_COMMUNITY): Payer: Medicare HMO

## 2019-06-08 ENCOUNTER — Encounter (HOSPITAL_COMMUNITY): Payer: Self-pay | Admitting: Emergency Medicine

## 2019-06-08 ENCOUNTER — Emergency Department (HOSPITAL_COMMUNITY)
Admission: EM | Admit: 2019-06-08 | Discharge: 2019-06-08 | Disposition: A | Discharge status: Home / Self Care | Payer: Medicare HMO | Attending: Emergency Medicine | Admitting: Emergency Medicine

## 2019-06-08 DIAGNOSIS — R002 Palpitations: Secondary | ICD-10-CM

## 2019-06-08 DIAGNOSIS — E119 Type 2 diabetes mellitus without complications: Secondary | ICD-10-CM | POA: Insufficient documentation

## 2019-06-08 DIAGNOSIS — Z79899 Other long term (current) drug therapy: Secondary | ICD-10-CM | POA: Diagnosis not present

## 2019-06-08 DIAGNOSIS — Z8673 Personal history of transient ischemic attack (TIA), and cerebral infarction without residual deficits: Secondary | ICD-10-CM | POA: Diagnosis not present

## 2019-06-08 DIAGNOSIS — Z794 Long term (current) use of insulin: Secondary | ICD-10-CM | POA: Diagnosis not present

## 2019-06-08 DIAGNOSIS — I10 Essential (primary) hypertension: Secondary | ICD-10-CM | POA: Diagnosis not present

## 2019-06-08 DIAGNOSIS — F17220 Nicotine dependence, chewing tobacco, uncomplicated: Secondary | ICD-10-CM | POA: Diagnosis not present

## 2019-06-08 DIAGNOSIS — R55 Syncope and collapse: Secondary | ICD-10-CM | POA: Diagnosis not present

## 2019-06-08 DIAGNOSIS — R079 Chest pain, unspecified: Secondary | ICD-10-CM | POA: Diagnosis not present

## 2019-06-08 DIAGNOSIS — R69 Illness, unspecified: Secondary | ICD-10-CM | POA: Diagnosis not present

## 2019-06-08 LAB — CBC WITH DIFFERENTIAL/PLATELET
Abs Immature Granulocytes: 0.03 10*3/uL (ref 0.00–0.07)
Basophils Absolute: 0.1 10*3/uL (ref 0.0–0.1)
Basophils Relative: 1 %
Eosinophils Absolute: 0.1 10*3/uL (ref 0.0–0.5)
Eosinophils Relative: 2 %
HCT: 49.5 % (ref 39.0–52.0)
Hemoglobin: 17.3 g/dL — ABNORMAL HIGH (ref 13.0–17.0)
Immature Granulocytes: 0 %
Lymphocytes Relative: 33 %
Lymphs Abs: 2.5 10*3/uL (ref 0.7–4.0)
MCH: 31.2 pg (ref 26.0–34.0)
MCHC: 34.9 g/dL (ref 30.0–36.0)
MCV: 89.2 fL (ref 80.0–100.0)
Monocytes Absolute: 0.8 10*3/uL (ref 0.1–1.0)
Monocytes Relative: 10 %
Neutro Abs: 4.2 10*3/uL (ref 1.7–7.7)
Neutrophils Relative %: 54 %
Platelets: 310 10*3/uL (ref 150–400)
RBC: 5.55 MIL/uL (ref 4.22–5.81)
RDW: 12.1 % (ref 11.5–15.5)
WBC: 7.7 10*3/uL (ref 4.0–10.5)
nRBC: 0 % (ref 0.0–0.2)

## 2019-06-08 LAB — COMPREHENSIVE METABOLIC PANEL
ALT: 25 U/L (ref 0–44)
AST: 17 U/L (ref 15–41)
Albumin: 4.4 g/dL (ref 3.5–5.0)
Alkaline Phosphatase: 66 U/L (ref 38–126)
Anion gap: 12 (ref 5–15)
BUN: 10 mg/dL (ref 6–20)
CO2: 24 mmol/L (ref 22–32)
Calcium: 9 mg/dL (ref 8.9–10.3)
Chloride: 103 mmol/L (ref 98–111)
Creatinine, Ser: 0.98 mg/dL (ref 0.61–1.24)
GFR calc Af Amer: >60 mL/min (ref >60)
GFR calc non Af Amer: >60 mL/min (ref >60)
Glucose, Bld: 161 mg/dL — ABNORMAL HIGH (ref 70–99)
Potassium: 4.4 mmol/L (ref 3.5–5.1)
Sodium: 139 mmol/L (ref 135–145)
Total Bilirubin: 1.9 mg/dL — ABNORMAL HIGH (ref 0.3–1.2)
Total Protein: 7.6 g/dL (ref 6.5–8.1)

## 2019-06-08 LAB — TSH: TSH: 2.297 u[IU]/mL (ref 0.350–4.500)

## 2019-06-08 LAB — TROPONIN I (HIGH SENSITIVITY)
Troponin I (High Sensitivity): 6 ng/L (ref <18)
Troponin I (High Sensitivity): 5 ng/L (ref <18)

## 2019-06-08 NOTE — ED Provider Notes (Signed)
[facility] EMERGENCY DEPARTMENT Provider Note   CSN: [id number] Arrival date & time: 06/08/19  1203     History   Chief Complaint No chief complaint on file. Chief complaint palpitations.  HPI Sean Horn is a 58 y.o. male.  Intermittent palpitations for the past few days.  Episodes are not associated with any chest pain, dyspnea, diaphoresis, nausea.  He did have one episode of passing out Saturday while walking.  No previous history of similar complaints.  Nothing makes symptoms better or worse.  He feels symptom-free in the emergency department.  Past medical history includes hypertension, diabetes, obesity     HPI  Past Medical History:  Diagnosis Date   Anxiety    Chronic headaches    Chronic neck pain    GERD (gastroesophageal reflux disease)    occasional - OTC as needed   History of chicken pox    History of kidney stones    History of shingles    Hypertension    under control with med., had been on med. 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   Retrognathia 12/02/2018   TIA (transient ischemic attack) 11/03/2018   Cervical stenosis of spine 01/16/2018   Hyperlipidemia 07/19/2017   Obesity 01/30/2017   Urinary frequency 12/11/2016   Insomnia 09/13/2016   History of chicken pox    History of shingles    Preventative health care 08/14/2013   Anxiety    History of kidney stones    GERD  (gastroesophageal reflux disease)    Type 2 diabetes mellitus with complication, with long-term current use of insulin (HCC)    Hypertension    Osteoarthritis    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    Class: Chronic    Past Surgical History:  Procedure Laterality Date   ANTERIOR CERVICAL DECOMP/DISCECTOMY FUSION N/A 01/16/2018   Procedure: ANTERIOR CERVICAL DECOMPRESSION/DISCECTOMY FUSION CERVICAL FOUR-FIVE, CERVICAL FIVE-SIX;  Surgeon: Consuella Lose, MD;  Location: [place];  Service: Neurosurgery;  Laterality: N/A;  ANTERIOR CERVICAL DECOMPRESSION/DISCECTOMY FUSION CERVICAL FOUR-FIVE, CERVICAL FIVE-SIX   CHOLECYSTECTOMY  1980's   CYSTOSCOPY/RETROGRADE/URETEROSCOPY/STONE EXTRACTION WITH BASKET Right 08/27/2003   and double J stent placement   INGUINAL HERNIA REPAIR Left 1990's   KNEE ARTHROSCOPY Left 1980's   KNEE ARTHROSCOPY Right 2013   KNEE ARTHROSCOPY Right 06/30/2013   Procedure: RIGHT KNEE ARTHROSCOPY AND SYNOVECTOMY;  Surgeon: Hessie Dibble, MD;  Location: [place];  Service: Orthopedics;  Laterality: Right;   LITHOTRIPSY     multiple, b/l   SHOULDER ADHESION RELEASE Left ` 1998   SHOULDER ARTHROSCOPY W/ ROTATOR CUFF REPAIR Left 10/20/2004   x 4, had to have part of clavile removed. torn rotator cuff multiple times, had to place screws    SHOULDER ARTHROSCOPY W/ SUPERIOR LABRAL ANTERIOR POSTERIOR LESION REPAIR Left 12/22/2004   TOTAL KNEE ARTHROPLASTY Right 12/23/2012   x3, 2 arthroscopies and a TKR,  TOTAL KNEE ARTHROPLASTY;  Surgeon: Velna Ochs, MD;  Location: [facility] OR;  Service: Orthopedics;  Laterality: Right;  DEPUY, RNFA   WRIST SURGERY Right    x 3, torn ligaments, pins placed then infected, then fused with plate        Home Medications    Prior to Admission medications   Medication Sig Start Date End Date Taking? Authorizing Provider  atorvastatin (LIPITOR) 10 MG tablet Take 0.5 tablets (5 mg total) by mouth every other day. 05/05/19   Yes Bradd Canary, MD  carvedilol (COREG) 12.5 MG tablet Take 1 tablet (12.5 mg total) by mouth 2 (two) times daily with a meal. 06/01/19  Yes Copland, Gwenlyn Found, MD  Coenzyme Q10 (COQ-10) 75 MG CAPS Take 75 mg by mouth daily. 11/06/18  Yes Bradd Canary, MD  Continuous Blood Gluc Sensor (FREESTYLE LIBRE 14 DAY SENSOR) MISC 1 each by Does not apply route every 14 (fourteen) days. Change every 2 weeks 05/19/19  Yes Carlus Pavlov, MD  Dulaglutide (TRULICITY) 1.5 MG/0.5ML SOPN Inject 1.5 mg into the skin once a week. NEED APPOINTMENT FOR FURTHER REFILLS 05/04/19  Yes Carlus Pavlov, MD  escitalopram (LEXAPRO) 20 MG tablet Take 1 tablet by mouth once daily 05/04/19  Yes Bradd Canary, MD  EUTHYROX 25 MCG tablet TAKE 1 TABLET BY MOUTH ONCE DAILY BEFORE BREAKFAST 05/04/19  Yes Bradd Canary, MD  HYDROcodone-acetaminophen (NORCO/VICODIN) 5-325 MG tablet Take 1 tablet by mouth every 6 (six) hours as needed for moderate pain. 05/29/19  Yes Vickki Hearing, MD  insulin aspart (NOVOLOG FLEXPEN) 100 UNIT/ML FlexPen Inject 25-35 units 3 times daily with meals Patient taking differently: Inject 10 Units into the skin 3 (three) times daily with meals.  07/01/18  Yes Carlus Pavlov, MD  insulin detemir (LEVEMIR) 100 UNIT/ML injection Inject 0.75 mLs (75 Units total) into the skin at bedtime. Patient taking differently: Inject 60 Units into the skin at bedtime.  07/01/18  Yes Carlus Pavlov, MD  lansoprazole (PREVACID) 30 MG capsule Take 30 mg by mouth every morning.    Yes [provider]  lisinopril (ZESTRIL) 10 MG tablet Take 1 tablet (10 mg total) by mouth daily. 05/25/19  Yes Copland, Gwenlyn Found, MD  loratadine (CLARITIN) 10 MG tablet Take 10 mg by mouth every morning.    Yes [provider]  metFORMIN (GLUCOPHAGE-XR) 500 MG 24 hr tablet Take 1 tablet (500 mg total) by mouth 2 (two) times daily with a meal. 07/01/18  Yes Carlus Pavlov, MD  topiramate (TOPAMAX) 50 MG  tablet Take 1 tablet (50 mg total) by mouth at bedtime. 11/18/18  Yes Anson Fret, MD    Family History Family History  Problem Relation Age of Onset   COPD Mother        smoker   Hypertension Mother    Heart disease Mother        CHF   Diabetes Father    Heart disease Father        CABG in 80's   Stroke Father    Hypertension Father    Peripheral vascular disease Father    Hypertension Son        tachycardia   Heart disease Maternal Grandfather    Arthritis Paternal Grandmother    Colon cancer Neg Hx    Rectal cancer Neg Hx    Stomach cancer Neg Hx     Social History Social History   Tobacco Use   Smoking status: Never Smoker  Smokeless tobacco: Current User    Types: Snuff  Substance Use Topics   Alcohol use: No    Frequency: Never   Drug use: No     Allergies   Rosuvastatin and Toradol [ketorolac tromethamine]   Review of Systems Review of Systems  All other systems reviewed and are negative.    Physical Exam Updated Vital Signs BP 133/90    Pulse 68    Temp 97.8 F (36.6 C) (Oral)    Resp 10    Ht 6' (1.829 m)    Wt 100.2 kg    SpO2 94%    BMI 29.97 kg/m   Physical Exam Vitals signs and nursing note reviewed.  Constitutional:      Appearance: He is well-developed.     Comments: Well-appearing  HENT:     Head: Normocephalic and atraumatic.  Eyes:     Conjunctiva/sclera: Conjunctivae normal.  Neck:     Musculoskeletal: Neck supple.  Cardiovascular:     Rate and Rhythm: Normal rate and regular rhythm.  Pulmonary:     Effort: Pulmonary effort is normal.     Breath sounds: Normal breath sounds.  Abdominal:     General: Bowel sounds are normal.     Palpations: Abdomen is soft.  Musculoskeletal: Normal range of motion.  Skin:    General: Skin is warm and dry.  Neurological:     General: No focal deficit present.     Mental Status: He is alert and oriented to person, place, and time.  Psychiatric:        Behavior:  Behavior normal.      ED Treatments / Results  Labs (all labs ordered are listed, but only abnormal results are displayed) Labs Reviewed  CBC WITH DIFFERENTIAL/PLATELET - Abnormal; Notable for the following components:      Result Value   Hemoglobin 17.3 (*)    All other components within normal limits  COMPREHENSIVE METABOLIC PANEL - Abnormal; Notable for the following components:   Glucose, Bld 161 (*)    Total Bilirubin 1.9 (*)    All other components within normal limits  TSH  TROPONIN I (HIGH SENSITIVITY)  TROPONIN I (HIGH SENSITIVITY)    EKG EKG Interpretation  Date/Time:  Monday June 08 2019 12:14:08 EST Ventricular Rate:  76 PR Interval:    QRS Duration: 94 QT Interval:  394 QTC Calculation: 443 R Axis:   38 Text Interpretation: Sinus rhythm Baseline wander in lead(s) II III aVF Confirmed by Donnetta Hutching ([id number]) on 06/08/2019 2:15:27 PM   Radiology No results found.  Procedures Procedures (including critical care time)  Medications Ordered in ED Medications - No data to display   Initial Impression / Assessment and Plan / ED Course  I have reviewed the triage vital signs and the nursing notes.  Pertinent labs & imaging results that were available during my care of the patient were reviewed by me and considered in my medical decision making (see chart for details).        Patient is well-appearing.  Work-up including EKG, labs, troponin, cardiac monitoring showed no acute anomalies.  Patient was monitored for greater than 2 hours with no evidence of arrhythmia.  Findings were discussed with the patient and his son.  He will follow-up with his primary care doctor or cardiologist for event monitoring.  Final Clinical Impressions(s) / ED Diagnoses   Final diagnoses:  Syncope, unspecified syncope type  Palpitations    ED Discharge Orders    None  Donnetta Hutchingook, Makyiah Lie, MD 06/12/19 [phone]

## 2019-06-08 NOTE — Discharge Instructions (Addendum)
Follow-up with your cardiologist.  You may need a sustained heart monitor to evaluate your heart rhythm.  Glucose slightly elevated today.

## 2019-06-08 NOTE — ED Triage Notes (Signed)
Patient complaining of heart rate 180 at 0200 this morning. States he had syncopal episode Saturday while walking.

## 2019-06-09 ENCOUNTER — Ambulatory Visit (HOSPITAL_COMMUNITY)
Admission: RE | Admit: 2019-06-09 | Discharge: 2019-06-09 | Disposition: A | Discharge status: Home / Self Care | Payer: Medicare HMO | Source: Ambulatory Visit | Attending: Orthopedic Surgery | Admitting: Orthopedic Surgery

## 2019-06-09 ENCOUNTER — Encounter: Payer: Self-pay | Admitting: Family Medicine

## 2019-06-09 ENCOUNTER — Telehealth: Payer: Self-pay

## 2019-06-09 DIAGNOSIS — M25511 Pain in right shoulder: Secondary | ICD-10-CM | POA: Diagnosis not present

## 2019-06-09 DIAGNOSIS — R55 Syncope and collapse: Secondary | ICD-10-CM

## 2019-06-09 NOTE — Telephone Encounter (Signed)
Taken care of

## 2019-06-09 NOTE — Telephone Encounter (Signed)
Copied from [place] [id number]. Topic: Referral - Request for Referral >> Jun 08, 2019  2:41 PM Virl Axe D wrote: Has patient seen PCP for this complaint? Yes *If NO, is insurance requiring patient see PCP for this issue before PCP can refer them? Referral for which specialty: Cardiologist Preferred provider/office: in network Reason for referral: Pt went to ED today for heart palpitations and was advised to follow up with a cardiologist. Pt will schedule HFU with Dr. Lorelei Pont as well

## 2019-06-10 ENCOUNTER — Telehealth: Payer: Self-pay | Admitting: Orthopedic Surgery

## 2019-06-10 NOTE — Telephone Encounter (Signed)
As stated in his instructions :Dr Aline Brochure will call you with the results

## 2019-06-10 NOTE — Telephone Encounter (Signed)
Patient had left message via MyChart to request appointment following having MRI at [facility]; I returned call - reached voice mail, and left message. Is patient to be seen or may he receive results by phone due to Dr to be out of clinic until Monday?

## 2019-06-10 NOTE — Telephone Encounter (Signed)
Noted. Patient's MyChart message indicated request for appointment. He will await Dr's call with results.

## 2019-06-11 ENCOUNTER — Encounter: Payer: Self-pay | Admitting: Family Medicine

## 2019-06-11 NOTE — Telephone Encounter (Signed)
Called [facility] to change his location to [place] Called pt and LMOM with this plan

## 2019-06-12 ENCOUNTER — Ambulatory Visit (INDEPENDENT_AMBULATORY_CARE_PROVIDER_SITE_OTHER): Payer: Medicare HMO

## 2019-06-12 ENCOUNTER — Emergency Department (HOSPITAL_BASED_OUTPATIENT_CLINIC_OR_DEPARTMENT_OTHER): Payer: Medicare HMO

## 2019-06-12 ENCOUNTER — Telehealth: Payer: Self-pay

## 2019-06-12 ENCOUNTER — Encounter (HOSPITAL_COMMUNITY): Payer: Self-pay

## 2019-06-12 ENCOUNTER — Observation Stay (HOSPITAL_COMMUNITY)
Admission: EM | Admit: 2019-06-12 | Discharge: 2019-06-12 | Disposition: A | Discharge status: Home / Self Care | Payer: Medicare HMO | Attending: Internal Medicine | Admitting: Internal Medicine

## 2019-06-12 ENCOUNTER — Other Ambulatory Visit: Payer: Self-pay

## 2019-06-12 DIAGNOSIS — Z79899 Other long term (current) drug therapy: Secondary | ICD-10-CM | POA: Insufficient documentation

## 2019-06-12 DIAGNOSIS — E669 Obesity, unspecified: Secondary | ICD-10-CM | POA: Insufficient documentation

## 2019-06-12 DIAGNOSIS — R079 Chest pain, unspecified: Principal | ICD-10-CM | POA: Insufficient documentation

## 2019-06-12 DIAGNOSIS — Z794 Long term (current) use of insulin: Secondary | ICD-10-CM | POA: Insufficient documentation

## 2019-06-12 DIAGNOSIS — Z20828 Contact with and (suspected) exposure to other viral communicable diseases: Secondary | ICD-10-CM | POA: Insufficient documentation

## 2019-06-12 DIAGNOSIS — I248 Other forms of acute ischemic heart disease: Secondary | ICD-10-CM | POA: Diagnosis not present

## 2019-06-12 DIAGNOSIS — I471 Supraventricular tachycardia: Secondary | ICD-10-CM | POA: Diagnosis not present

## 2019-06-12 DIAGNOSIS — R000 Tachycardia, unspecified: Secondary | ICD-10-CM | POA: Diagnosis not present

## 2019-06-12 DIAGNOSIS — Z6829 Body mass index (BMI) 29.0-29.9, adult: Secondary | ICD-10-CM | POA: Diagnosis not present

## 2019-06-12 DIAGNOSIS — E785 Hyperlipidemia, unspecified: Secondary | ICD-10-CM | POA: Diagnosis not present

## 2019-06-12 DIAGNOSIS — I10 Essential (primary) hypertension: Secondary | ICD-10-CM | POA: Diagnosis not present

## 2019-06-12 DIAGNOSIS — R002 Palpitations: Secondary | ICD-10-CM | POA: Diagnosis not present

## 2019-06-12 DIAGNOSIS — E119 Type 2 diabetes mellitus without complications: Secondary | ICD-10-CM | POA: Diagnosis not present

## 2019-06-12 DIAGNOSIS — K219 Gastro-esophageal reflux disease without esophagitis: Secondary | ICD-10-CM | POA: Insufficient documentation

## 2019-06-12 DIAGNOSIS — R0789 Other chest pain: Secondary | ICD-10-CM | POA: Diagnosis not present

## 2019-06-12 LAB — BASIC METABOLIC PANEL
Anion gap: 9 (ref 5–15)
BUN: 16 mg/dL (ref 6–20)
CO2: 23 mmol/L (ref 22–32)
Calcium: 9.2 mg/dL (ref 8.9–10.3)
Chloride: 102 mmol/L (ref 98–111)
Creatinine, Ser: 1.14 mg/dL (ref 0.61–1.24)
GFR calc Af Amer: >60 mL/min (ref >60)
GFR calc non Af Amer: >60 mL/min (ref >60)
Glucose, Bld: 228 mg/dL — ABNORMAL HIGH (ref 70–99)
Potassium: 3.9 mmol/L (ref 3.5–5.1)
Sodium: 134 mmol/L — ABNORMAL LOW (ref 135–145)

## 2019-06-12 LAB — SARS CORONAVIRUS 2 (TAT 6-24 HRS): SARS Coronavirus 2: NEGATIVE

## 2019-06-12 LAB — CBC WITH DIFFERENTIAL/PLATELET
Abs Immature Granulocytes: 0.03 10*3/uL (ref 0.00–0.07)
Basophils Absolute: 0.1 10*3/uL (ref 0.0–0.1)
Basophils Relative: 1 %
Eosinophils Absolute: 0.2 10*3/uL (ref 0.0–0.5)
Eosinophils Relative: 2 %
HCT: 46.5 % (ref 39.0–52.0)
Hemoglobin: 16.5 g/dL (ref 13.0–17.0)
Immature Granulocytes: 0 %
Lymphocytes Relative: 39 %
Lymphs Abs: 4.5 10*3/uL — ABNORMAL HIGH (ref 0.7–4.0)
MCH: 31.4 pg (ref 26.0–34.0)
MCHC: 35.5 g/dL (ref 30.0–36.0)
MCV: 88.6 fL (ref 80.0–100.0)
Monocytes Absolute: 0.9 10*3/uL (ref 0.1–1.0)
Monocytes Relative: 8 %
Neutro Abs: 5.8 10*3/uL (ref 1.7–7.7)
Neutrophils Relative %: 50 %
Platelets: 307 10*3/uL (ref 150–400)
RBC: 5.25 MIL/uL (ref 4.22–5.81)
RDW: 12 % (ref 11.5–15.5)
WBC: 11.5 10*3/uL — ABNORMAL HIGH (ref 4.0–10.5)
nRBC: 0 % (ref 0.0–0.2)

## 2019-06-12 LAB — ECHOCARDIOGRAM LIMITED
Height: 72 in
Weight: 3527.36 oz

## 2019-06-12 LAB — TROPONIN I (HIGH SENSITIVITY)
Troponin I (High Sensitivity): 4 ng/L (ref <18)
Troponin I (High Sensitivity): 63 ng/L — ABNORMAL HIGH (ref <18)
Troponin I (High Sensitivity): 142 ng/L (ref <18)
Troponin I (High Sensitivity): 102 ng/L (ref <18)

## 2019-06-12 MED ORDER — METOPROLOL SUCCINATE ER 25 MG PO TB24: 25.0000 mg | ORAL_TABLET | Freq: Every day | ORAL | 2 refills | Status: DC

## 2019-06-12 MED ORDER — ASPIRIN 81 MG PO CHEW
324.0000 mg | CHEWABLE_TABLET | Freq: Once | ORAL | Status: AC
  Administered 2019-06-12: 324 mg via ORAL
  Filled 2019-06-12: qty 4

## 2019-06-12 MED ORDER — SODIUM CHLORIDE 0.9 % IV BOLUS (SEPSIS)
1000.0000 mL | Freq: Once | INTRAVENOUS | Status: AC
  Administered 2019-06-12: 1000 mL via INTRAVENOUS

## 2019-06-12 MED ORDER — METOPROLOL SUCCINATE ER 25 MG PO TB24
25.0000 mg | ORAL_TABLET | Freq: Every day | ORAL | Status: DC
  Administered 2019-06-12: 25 mg via ORAL
  Filled 2019-06-12: qty 1

## 2019-06-12 NOTE — ED Provider Notes (Signed)
[facility] EMERGENCY DEPARTMENT Provider Note   CSN: [id number] Arrival date & time: 06/12/19  7829     History   Chief Complaint Chief Complaint  Patient presents with   Chest Pain    HPI Sean Horn is a 58 y.o. male.  HPI: A 58 year old patient with a history of treated diabetes and hypertension presents for evaluation of chest pain. Initial onset of pain was approximately 1-3 hours ago. The patient's chest pain is described as heaviness/pressure/tightness and is not worse with exertion. The patient's chest pain is middle- or left-sided, is not well-localized, is not sharp and does not radiate to the arms/jaw/neck. The patient does not complain of nausea and denies diaphoresis. The patient has a family history of coronary artery disease in a first-degree relative with onset less than age 1. The patient has no history of stroke, has no history of peripheral artery disease, has not smoked in the past 90 days, has no history of hypercholesterolemia and does not have an elevated BMI (>=30).   HPI Patient presents with palpitations and chest pain.  Patient has a history of hypertension diabetes.  Reports he went to bed feeling well, but woke up with his heart racing then began having left-sided chest pressure.  Denies any shortness of breath.  No syncope.  No fevers or vomiting.  He is now starting to improve.  He checked his heart rate at home and it felt like it was over 200. No h/o CAD He has f/u with cardiology next week  He had recent ER visit for similar episode.  Past Medical History:  Diagnosis Date   Anxiety    Chronic headaches    Chronic neck pain    GERD (gastroesophageal reflux disease)    occasional - OTC as needed   History of chicken pox    History of kidney stones    History of shingles    Hypertension    under control with med., had been on med. 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   Retrognathia 12/02/2018   TIA (transient ischemic attack) 11/03/2018   Cervical stenosis of spine 01/16/2018   Hyperlipidemia 07/19/2017   Obesity 01/30/2017   Urinary frequency 12/11/2016   Insomnia 09/13/2016   History of chicken pox    History of shingles    Preventative health care 08/14/2013   Anxiety    History of kidney stones    GERD (gastroesophageal reflux disease)    Type 2 diabetes mellitus with complication, with long-term current use of insulin (HCC)    Hypertension    Osteoarthritis    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    Class: Chronic    Past Surgical History:  Procedure Laterality Date   ANTERIOR CERVICAL DECOMP/DISCECTOMY FUSION N/A 01/16/2018   Procedure: ANTERIOR CERVICAL DECOMPRESSION/DISCECTOMY FUSION CERVICAL FOUR-FIVE, CERVICAL FIVE-SIX;  Surgeon: Lisbeth Renshaw, MD;  Location: [facility] OR;  Service:  Neurosurgery;  Laterality: N/A;  ANTERIOR CERVICAL DECOMPRESSION/DISCECTOMY FUSION CERVICAL FOUR-FIVE, CERVICAL FIVE-SIX   CHOLECYSTECTOMY  1980's   CYSTOSCOPY/RETROGRADE/URETEROSCOPY/STONE EXTRACTION WITH BASKET Right 08/27/2003   and double J stent placement   INGUINAL HERNIA REPAIR Left 1990's   KNEE ARTHROSCOPY Left 1980's   KNEE ARTHROSCOPY Right 2013   KNEE ARTHROSCOPY Right 06/30/2013   Procedure: RIGHT KNEE ARTHROSCOPY AND SYNOVECTOMY;  Surgeon: Velna Ochs, MD;  Location: [facility];  Service: Orthopedics;   Laterality: Right;   LITHOTRIPSY     multiple, b/l   SHOULDER ADHESION RELEASE Left ` 1998   SHOULDER ARTHROSCOPY W/ ROTATOR CUFF REPAIR Left 10/20/2004   x 4, had to have part of clavile removed. torn rotator cuff multiple times, had to place screws    SHOULDER ARTHROSCOPY W/ SUPERIOR LABRAL ANTERIOR POSTERIOR LESION REPAIR Left 12/22/2004   TOTAL KNEE ARTHROPLASTY Right 12/23/2012   x3, 2 arthroscopies and a TKR,  TOTAL KNEE ARTHROPLASTY;  Surgeon: Velna Ochs, MD;  Location: [facility] OR;  Service: Orthopedics;  Laterality: Right;  DEPUY, RNFA   WRIST SURGERY Right    x 3, torn ligaments, pins placed then infected, then fused with plate        Home Medications    Prior to Admission medications   Medication Sig Start Date End Date Taking? Authorizing Provider  atorvastatin (LIPITOR) 10 MG tablet Take 0.5 tablets (5 mg total) by mouth every other day. 05/05/19   Bradd Canary, MD  carvedilol (COREG) 12.5 MG tablet Take 1 tablet (12.5 mg total) by mouth 2 (two) times daily with a meal. 06/01/19   Copland, Gwenlyn Found, MD  Coenzyme Q10 (COQ-10) 75 MG CAPS Take 75 mg by mouth daily. 11/06/18   Bradd Canary, MD  Continuous Blood Gluc Sensor (FREESTYLE LIBRE 14 DAY SENSOR) MISC 1 each by Does not apply route every 14 (fourteen) days. Change every 2 weeks 05/19/19   Carlus Pavlov, MD  Dulaglutide (TRULICITY) 1.5 MG/0.5ML SOPN Inject 1.5 mg into the skin once a week. NEED APPOINTMENT FOR FURTHER REFILLS 05/04/19   Carlus Pavlov, MD  escitalopram (LEXAPRO) 20 MG tablet Take 1 tablet by mouth once daily 05/04/19   Bradd Canary, MD  EUTHYROX 25 MCG tablet TAKE 1 TABLET BY MOUTH ONCE DAILY BEFORE BREAKFAST 05/04/19   Bradd Canary, MD  HYDROcodone-acetaminophen (NORCO/VICODIN) 5-325 MG tablet Take 1 tablet by mouth every 6 (six) hours as needed for moderate pain. 05/29/19   Vickki Hearing, MD  insulin aspart (NOVOLOG FLEXPEN) 100 UNIT/ML FlexPen Inject 25-35 units 3 times daily with  meals Patient taking differently: Inject 10 Units into the skin 3 (three) times daily with meals.  07/01/18   Carlus Pavlov, MD  insulin detemir (LEVEMIR) 100 UNIT/ML injection Inject 0.75 mLs (75 Units total) into the skin at bedtime. Patient taking differently: Inject 60 Units into the skin at bedtime.  07/01/18   Carlus Pavlov, MD  lansoprazole (PREVACID) 30 MG capsule Take 30 mg by mouth every morning.     [provider]  lisinopril (ZESTRIL) 10 MG tablet Take 1 tablet (10 mg total) by mouth daily. 05/25/19   Copland, Gwenlyn Found, MD  loratadine (CLARITIN) 10 MG tablet Take 10 mg by mouth every morning.     [provider]  metFORMIN (GLUCOPHAGE-XR) 500 MG 24 hr tablet Take 1 tablet (500 mg total) by mouth 2 (two) times daily with a meal. 07/01/18   Carlus Pavlov, MD  topiramate (TOPAMAX) 50 MG  tablet Take 1 tablet (50 mg total) by mouth at bedtime. 11/18/18   Anson FretAhern, Antonia B, MD    Family History Family History  Problem Relation Age of Onset   COPD Mother        smoker   Hypertension Mother    Heart disease Mother        CHF   Diabetes Father    Heart disease Father        quintuple bypass   Stroke Father    Hypertension Father    Peripheral vascular disease Father    Hypertension Son        tachycardia   Heart disease Maternal Grandfather    Arthritis Paternal Grandmother    Colon cancer Neg Hx    Rectal cancer Neg Hx    Stomach cancer Neg Hx     Social History Social History   Tobacco Use   Smoking status: Never Smoker   Smokeless tobacco: Current User    Types: Snuff  Substance Use Topics   Alcohol use: No    Frequency: Never   Drug use: No     Allergies   Rosuvastatin and Toradol [ketorolac tromethamine]   Review of Systems Review of Systems  Constitutional: Negative for fever.  Respiratory: Negative for shortness of breath.   Cardiovascular: Positive for chest pain and palpitations.  Neurological:  Negative for syncope.  All other systems reviewed and are negative.    Physical Exam Updated Vital Signs BP 110/78    Pulse 95    Temp 97.7 F (36.5 C) (Oral)    Resp 20    Ht 1.829 m (6')    Wt 100 kg    SpO2 97%    BMI 29.90 kg/m   Physical Exam CONSTITUTIONAL: Well developed/well nourished HEAD: Normocephalic/atraumatic EYES: EOMI/PERRL ENMT: Mucous membranes moist NECK: supple no meningeal signs SPINE/BACK:entire spine nontender CV: S1/S2 noted, no murmurs/rubs/gallops noted LUNGS: Lungs are clear to auscultation bilaterally, no apparent distress ABDOMEN: soft, nontender, no rebound or guarding, bowel sounds noted throughout abdomen GU:no cva tenderness NEURO: Pt is awake/alert/appropriate, moves all extremitiesx4.  No facial droop.   EXTREMITIES: pulses normal/equal, full ROM, no lower extremity edema or tenderness SKIN: warm, color normal PSYCH: no abnormalities of mood noted, alert and oriented to situation   ED Treatments / Results  Labs (all labs ordered are listed, but only abnormal results are displayed) Labs Reviewed  BASIC METABOLIC PANEL - Abnormal; Notable for the following components:      Result Value   Sodium 134 (*)    Glucose, Bld 228 (*)    All other components within normal limits  CBC WITH DIFFERENTIAL/PLATELET - Abnormal; Notable for the following components:   WBC 11.5 (*)    Lymphs Abs 4.5 (*)    All other components within normal limits  TROPONIN I (HIGH SENSITIVITY) - Abnormal; Notable for the following components:   Troponin I (High Sensitivity) 63 (*)    All other components within normal limits  SARS CORONAVIRUS 2 (TAT 6-24 HRS)  TROPONIN I (HIGH SENSITIVITY)    EKG EKG Interpretation  Date/Time:  Friday June 12 2019 03:22:26 EST Ventricular Rate:  101 PR Interval:    QRS Duration: 97 QT Interval:  354 QTC Calculation: 459 R Axis:   65 Text Interpretation: Sinus tachycardia Atrial premature complex Low voltage, precordial  leads No significant change since last tracing Confirmed by Zadie RhineWickline, Emmalie Haigh ([id number]) on 06/12/2019 3:28:44 AM   Radiology No results found.  Procedures Procedures  Medications Ordered in ED Medications  sodium chloride 0.9 % bolus 1,000 mL (has no administration in time range)  aspirin chewable tablet 324 mg (324 mg Oral Given 06/12/19 3094)     Initial Impression / Assessment and Plan / ED Course  I have reviewed the triage vital signs and the nursing notes.  Pertinent labs & imaging results that were available during my care of the patient were reviewed by me and considered in my medical decision making (see chart for details).     HEAR Score: 4  5:10 AM Pt reports feeling palpitations at home and then had chest pressure EKG tonight reveals sinus tach Recent ED visit for same Workup in process at this time CXR deferred as pt had one about 4 days ago and has no cough/sob/fever Low suspicion for PE/Dissection at this time 6:59 AM  Overall patient is well-appearing.  He denies any acute worsening.  Monitoring in the ER has revealed episodes of PVCs. His repeat troponin is elevating.  This likely indicates that he did have significant episode of tachycardia earlier tonight. I feel patient should be admitted due to repeat ER visit, worsening symptoms at home, and elevated troponin.  Patient agreeable with plan aspirin  has been ordered. 7:23 AM Per nursing patient had an episode of tachycardia to the 160s while standing, this improved upon sitting.  Discussed with Dr. Heath Lark for admission Final Clinical Impressions(s) / ED Diagnoses   Final diagnoses:  Palpitations    ED Discharge Orders    None       Ripley Fraise, MD 06/12/19 [phone]

## 2019-06-12 NOTE — ED Notes (Signed)
Date and time results received: 06/12/19 1025 (use smartphrase ".now" to insert current time)  Test: Troponin Critical Value: 142  Name of Provider Notified: Manuella Ghazi  Orders Received? Or Actions Taken?: Orders Received - See Orders for details

## 2019-06-12 NOTE — Progress Notes (Signed)
*  PRELIMINARY RESULTS* Echocardiogram Limited 2-D Echocardiogram has been performed.  Samuel Germany 06/12/2019, 12:43 PM

## 2019-06-12 NOTE — Consult Note (Addendum)
Cardiology Consult    Patient ID: NYZIR DUBOIS; [id number]; 24-Jan-1961   Admit date: 06/12/2019 Date of Consult: 06/12/2019  Primary Care Provider: Pearline Cables, MD Primary Cardiologist: Previously followed by Dr. Eden Emms in 2015  Patient Profile    Sean Horn is a 58 y.o. male with past medical history of HTN, HLD and IDDM who is being seen today for the evaluation of tachycardia and chest pain at the request of Dr. Sherryll Burger.   History of Present Illness    Sean Horn recently presented to [facility] ED on 06/08/2019 for evaluation of palpitations and syncope. The EDP note is not yet completed but electrolytes were within normal limits and troponin values were negative.  EKG showed normal sinus rhythm, heart rate 76 with no acute ST changes and he was discharged home.  He was referred to Cardiology as an outpatient and has a new patient appointment with Dr. Servando Salina on 06/16/2019.  He presented back to the ED earlier this morning for recurrent palpitations and chest discomfort. He reports being in his usual state of health until approximately 3 weeks ago when he started to experience increasing palpitations. He typically noticed these at night but would focus on his breathing and they would spontaneously resolve. This past weekend, he experienced worsening palpitations and reports associated dizziness at that time. He had a presyncopal episode while in his house and says that he actually lost consciousness upon walking to his truck.  He is unsure for how long he passed out but denies any postictal symptoms. He did not come to the ED that day but presented on 06/08/2019 as outlined above with reassuring work-up at that time.  Last night, he was awoke from sleep with recurrent palpitations and says he checked his pulse rate and it was above 200. Symptoms lasted for over an hour which prompted him to call EMS. He had a recurrent episode earlier this morning around 0630 which resembled  his prior symptoms. He reports occasional episodes of chest tightness with the palpitations but denies any chest pain by itself. He performs routine yard work and denies any recent chest pain or dyspnea on exertion with these activities. No recent orthopnea, PND or lower extremity edema.  He denies any recent changes in his medication regimen. Says he has been compliant with Coreg 12.5 mg twice daily as an outpatient and has not missed any doses. He consumes approximately 1-2 caffeinated sodas daily but denies any alcohol use.  No personal history of CAD or CHF.  He reports his father had CAD and required CABG in his 22's. The patient denies any tobacco use, alcohol use or recreational drug use.  Initial labs showed WBC 11.5, Hgb 16.5, platelets 307, Na+ 134, K+ 3.9 and creatinine 1.14. Initial troponin 4 with repeat value at 63.  EKG shows sinus tachycardia, heart rate 101 with PAC's with no acute ST abnormalities. Telemetry shows a narrow-complex tachycardia occurring this morning with HR in the 160's to 170's. He did have 36 beats of a wide complex tachycardia concerning for VT or possible SVT with aberancy.     Past Medical History:  Diagnosis Date   Anxiety    Chronic headaches    Chronic neck pain    GERD (gastroesophageal reflux disease)    occasional - OTC as needed   History of chicken pox    History of kidney stones    History of shingles    Hypertension    under control  with med., had been on med. since age 61   Insomnia 09/13/2016   Insulin dependent diabetes mellitus    Follows with endocrinology, Dr Chestine Spore reports his last hgba1c was 7.3    Non-insulin dependent type 2 diabetes mellitus (HCC)    Obesity 01/30/2017   Occipital neuralgia    Osteoarthritis    right knee   PONV (postoperative nausea and vomiting)    PONV (postoperative nausea and vomiting)    Preventative health care 08/14/2013   Tachycardia    Urinary frequency 12/11/2016    Past Surgical  History:  Procedure Laterality Date   ANTERIOR CERVICAL DECOMP/DISCECTOMY FUSION N/A 01/16/2018   Procedure: ANTERIOR CERVICAL DECOMPRESSION/DISCECTOMY FUSION CERVICAL FOUR-FIVE, CERVICAL FIVE-SIX;  Surgeon: Lisbeth Renshaw, MD;  Location: [facility] OR;  Service: Neurosurgery;  Laterality: N/A;  ANTERIOR CERVICAL DECOMPRESSION/DISCECTOMY FUSION CERVICAL FOUR-FIVE, CERVICAL FIVE-SIX   CHOLECYSTECTOMY  1980's   CYSTOSCOPY/RETROGRADE/URETEROSCOPY/STONE EXTRACTION WITH BASKET Right 08/27/2003   and double J stent placement   INGUINAL HERNIA REPAIR Left 1990's   KNEE ARTHROSCOPY Left 1980's   KNEE ARTHROSCOPY Right 2013   KNEE ARTHROSCOPY Right 06/30/2013   Procedure: RIGHT KNEE ARTHROSCOPY AND SYNOVECTOMY;  Surgeon: Velna Ochs, MD;  Location: [facility];  Service: Orthopedics;  Laterality: Right;   LITHOTRIPSY     multiple, b/l   SHOULDER ADHESION RELEASE Left ` 1998   SHOULDER ARTHROSCOPY W/ ROTATOR CUFF REPAIR Left 10/20/2004   x 4, had to have part of clavile removed. torn rotator cuff multiple times, had to place screws    SHOULDER ARTHROSCOPY W/ SUPERIOR LABRAL ANTERIOR POSTERIOR LESION REPAIR Left 12/22/2004   TOTAL KNEE ARTHROPLASTY Right 12/23/2012   x3, 2 arthroscopies and a TKR,  TOTAL KNEE ARTHROPLASTY;  Surgeon: Velna Ochs, MD;  Location: [facility] OR;  Service: Orthopedics;  Laterality: Right;  DEPUY, RNFA   WRIST SURGERY Right    x 3, torn ligaments, pins placed then infected, then fused with plate     Home Medications:  Prior to Admission medications   Medication Sig Start Date End Date Taking? Authorizing Provider  atorvastatin (LIPITOR) 10 MG tablet Take 0.5 tablets (5 mg total) by mouth every other day. 05/05/19   Bradd Canary, MD  carvedilol (COREG) 12.5 MG tablet Take 1 tablet (12.5 mg total) by mouth 2 (two) times daily with a meal. 06/01/19   Copland, Gwenlyn Found, MD  Coenzyme Q10 (COQ-10) 75 MG CAPS Take 75 mg by mouth daily. 11/06/18   Bradd Canary, MD  Continuous Blood Gluc Sensor (FREESTYLE LIBRE 14 DAY SENSOR) MISC 1 each by Does not apply route every 14 (fourteen) days. Change every 2 weeks 05/19/19   Carlus Pavlov, MD  Dulaglutide (TRULICITY) 1.5 MG/0.5ML SOPN Inject 1.5 mg into the skin once a week. NEED APPOINTMENT FOR FURTHER REFILLS 05/04/19   Carlus Pavlov, MD  escitalopram (LEXAPRO) 20 MG tablet Take 1 tablet by mouth once daily 05/04/19   Bradd Canary, MD  EUTHYROX 25 MCG tablet TAKE 1 TABLET BY MOUTH ONCE DAILY BEFORE BREAKFAST 05/04/19   Bradd Canary, MD  HYDROcodone-acetaminophen (NORCO/VICODIN) 5-325 MG tablet Take 1 tablet by mouth every 6 (six) hours as needed for moderate pain. 05/29/19   Vickki Hearing, MD  insulin aspart (NOVOLOG FLEXPEN) 100 UNIT/ML FlexPen Inject 25-35 units 3 times daily with meals Patient taking differently: Inject 10 Units into the skin 3 (three) times daily with meals.  07/01/18   Carlus Pavlov, MD  insulin detemir (LEVEMIR) 100  UNIT/ML injection Inject 0.75 mLs (75 Units total) into the skin at bedtime. Patient taking differently: Inject 60 Units into the skin at bedtime.  07/01/18   Carlus PavlovGherghe, Cristina, MD  lansoprazole (PREVACID) 30 MG capsule Take 30 mg by mouth every morning.     [provider]  lisinopril (ZESTRIL) 10 MG tablet Take 1 tablet (10 mg total) by mouth daily. 05/25/19   Copland, Gwenlyn FoundJessica C, MD  loratadine (CLARITIN) 10 MG tablet Take 10 mg by mouth every morning.     [provider]  metFORMIN (GLUCOPHAGE-XR) 500 MG 24 hr tablet Take 1 tablet (500 mg total) by mouth 2 (two) times daily with a meal. 07/01/18   Carlus PavlovGherghe, Cristina, MD  topiramate (TOPAMAX) 50 MG tablet Take 1 tablet (50 mg total) by mouth at bedtime. 11/18/18   Anson FretAhern, Antonia B, MD    Inpatient Medications: Scheduled Meds:  Continuous Infusions:  sodium chloride 1,000 mL (06/12/19 0730)   PRN Meds:   Allergies:    Allergies  Allergen Reactions   Rosuvastatin  Other (See Comments)   Toradol [Ketorolac Tromethamine] Itching    Social History:   Social History   Socioeconomic History   Marital status: Single    Spouse name: Not on file   Number of children: 2   Years of education: Not on file   Highest education level: High school graduate  Occupational History   Occupation: Disabled  Ecologistocial Needs   Financial resource strain: Not on file   Food insecurity    Worry: Not on file    Inability: Not on file   Transportation needs    Medical: Not on file    Non-medical: Not on file  Tobacco Use   Smoking status: Never Smoker   Smokeless tobacco: Current User    Types: Snuff  Substance and Sexual Activity   Alcohol use: No    Frequency: Never   Drug use: No   Sexual activity: Yes  Lifestyle   Physical activity    Days per week: Not on file    Minutes per session: Not on file   Stress: Not on file  Relationships   Social connections    Talks on phone: Not on file    Gets together: Not on file    Attends religious service: Not on file    Active member of club or organization: Not on file    Attends meetings of clubs or organizations: Not on file    Relationship status: Not on file   Intimate partner violence    Fear of current or ex partner: Not on file    Emotionally abused: Not on file    Physically abused: Not on file    Forced sexual activity: Not on file  Other Topics Concern   Not on file  Social History Narrative   Lives at home with his father (he takes care of his father)   Right handed   Drinks 2 cups of caffeine daily     Family History:    Family History  Problem Relation Age of Onset   COPD Mother        smoker   Hypertension Mother    Heart disease Mother        CHF   Diabetes Father    Heart disease Father        quintuple bypass   Stroke Father    Hypertension Father    Peripheral vascular disease Father    Hypertension Son  tachycardia   Heart disease  Maternal Grandfather    Arthritis Paternal Grandmother    Colon cancer Neg Hx    Rectal cancer Neg Hx    Stomach cancer Neg Hx       Review of Systems    General:  No chills, fever, night sweats or weight changes.  Cardiovascular:  No chest pain, dyspnea on exertion, edema, orthopnea, paroxysmal nocturnal dyspnea. Positive for palpitations.  Dermatological: No rash, lesions/masses Respiratory: No cough, dyspnea Urologic: No hematuria, dysuria Abdominal:   No nausea, vomiting, diarrhea, bright red blood per rectum, melena, or hematemesis Neurologic:  No visual changes, wkns, changes in mental status.  All other systems reviewed and are otherwise negative except as noted above.  Physical Exam/Data    Vitals:   06/12/19 0430 06/12/19 0658 06/12/19 0700 06/12/19 0731  BP: 110/78  (!) 129/112 101/70  Pulse: 95 (!) 179 91 81  Resp: 20 (!) 25 (!) 23 (!) 21  Temp:      TempSrc:      SpO2: 97% 99% 99% 97%  Weight:      Height:       No intake or output data in the 24 hours ending 06/12/19 0752 Filed Weights   06/12/19 0323  Weight: 100 kg   Body mass index is 29.9 kg/m.   General: Pleasant Caucasian male appearing in NAD Psych: Normal affect. Neuro: Alert and oriented X 3. Moves all extremities spontaneously. HEENT: Normal  Neck: Supple without bruits or JVD. Lungs:  Resp regular and unlabored, CTA without wheezing or rales. Heart: RRR with occasional ectopic beats. No s3, s4, or murmurs. Abdomen: Soft, non-tender, non-distended, BS + x 4.  Extremities: No clubbing, cyanosis or lower extremity edema. DP/PT/Radials 2+ and equal bilaterally.   EKG:  The EKG was personally reviewed and demonstrates: Sinus tachycardia, heart rate 101 with PAC's with no acute ST abnormalities.  Telemetry:  Telemetry was personally reviewed and demonstrates: As above.    Labs/Studies     Relevant CV Studies:  Echocardiogram: 10/2018 IMPRESSIONS    1. The left ventricle has a  visually estimated ejection fraction of 55%. The cavity size was normal. Moderate basal septal hypertrophy. Otherwise mild concentric LVH of remaining myocardium. Inderminate grade diastolic dysfunction. Indeterminate filling  pressures No evidence of left ventricular regional wall motion abnormalities.  2. The right ventricle has normal systolic function. The cavity was normal. There is no increase in right ventricular wall thickness.  3. The mitral valve is grossly normal.  4. The tricuspid valve is grossly normal.  5. The aortic valve is tricuspid.  6. The aortic root is normal in size and structure.  7. No interatrial shunts as demonstrated by agitated saline study.  Laboratory Data:  Chemistry Recent Labs  Lab 06/08/19 1226 06/12/19 0331  NA 139 134*  K 4.4 3.9  CL 103 102  CO2 24 23  GLUCOSE 161* 228*  BUN 10 16  CREATININE 0.98 1.14  CALCIUM 9.0 9.2  GFRNONAA >60 >60  GFRAA >60 >60  ANIONGAP 12 9    Recent Labs  Lab 06/08/19 1226  PROT 7.6  ALBUMIN 4.4  AST 17  ALT 25  ALKPHOS 66  BILITOT 1.9*   Hematology Recent Labs  Lab 06/08/19 1226 06/12/19 0331  WBC 7.7 11.5*  RBC 5.55 5.25  HGB 17.3* 16.5  HCT 49.5 46.5  MCV 89.2 88.6  MCH 31.2 31.4  MCHC 34.9 35.5  RDW 12.1 12.0  PLT 310 307  Cardiac EnzymesNo results for input(s): TROPONINI in the last 168 hours. No results for input(s): TROPIPOC in the last 168 hours.  BNPNo results for input(s): BNP, PROBNP in the last 168 hours.  DDimer No results for input(s): DDIMER in the last 168 hours.  Radiology/Studies:  Mr Shoulder Right Wo Contrast  Result Date: 06/09/2019 CLINICAL DATA:  Right shoulder pain for 7 months.  No known injury. EXAM: MRI OF THE RIGHT SHOULDER WITHOUT CONTRAST TECHNIQUE: Multiplanar, multisequence MR imaging of the shoulder was performed. No intravenous contrast was administered. COMPARISON:  None. FINDINGS: Rotator cuff: Severe tendinosis of the supraspinatus tendon with a small  partial-thickness articular surface tear. Infraspinatus tendon is intact. Teres minor tendon is intact. Subscapularis tendon is intact. Muscles: No atrophy or fatty replacement of nor abnormal signal within, the muscles of the rotator cuff. Biceps long head: Moderate tendinosis of the intra-articular portion of the long head of the biceps tendon. Acromioclavicular Joint: Moderate arthropathy of the acromioclavicular joint. Type I acromion. No subacromial/subdeltoid bursal fluid. Glenohumeral Joint: No joint effusion. No chondral defect. Labrum: Grossly intact, but evaluation is limited by lack of intraarticular fluid. Bones:  No acute osseous abnormality.  No aggressive osseous lesion. Other: No fluid collection or hematoma. IMPRESSION: 1. Severe tendinosis of the supraspinatus tendon with a small partial-thickness articular surface tear. 2. Moderate tendinosis of the intra-articular portion of the long head of the biceps tendon. Electronically Signed   By: Elige Ko   On: 06/09/2019 08:30   Dg Chest Port 1 View  Result Date: 06/08/2019 CLINICAL DATA:  Chest pain, syncopal episode EXAM: PORTABLE CHEST 1 VIEW COMPARISON:  12/16/2012 FINDINGS: The heart size and mediastinal contours are within normal limits. Both lungs are clear. The visualized skeletal structures are unremarkable. IMPRESSION: No active disease. Electronically Signed   By: Elige Ko   On: 06/08/2019 13:08     Assessment & Plan    1. Narrow-Complex Tachycardia/Palpitations - presents with 2-3 weeks of worsening palpitations with associated dizziness and an actual syncopal event this past weekend. Episode last night awoke him from sleep and HR was in the 200's per his report.  - electrolytes within normal limits. TSH 2.297. Initial troponin 4 with repeat value at 63. Telemetry shows a narrow-complex tachycardia occurring this morning with HR in the 160's to 170's. He did have 36 beats of a wide complex tachycardia concerning for VT or  possible SVT with aberancy.  - will plan to continue to cycle enzymes. Will obtain a limited echo to reassess EF and wall motion. If EF stable, would not anticipate ischemic testing this admission. Would benefit from stress testing as an outpatient given his cardiac risk factors.  - Will see if he can be discharged with an outpatient event monitor. Will need EP evaluation as an outpatient as well. Will review with Dr. Diona Browner in regards to switching from Coreg to Toprol-XL as BP does not allow for further titration of Coreg at this time.   2. HTN - BP has been variable at 101/70 - 106/78 while in the ED. On Coreg 12.5mg  BID as an outpatient along with Lisinopril  daily. Would hold Lisinopril for now. Will discuss with Dr. Diona Browner in regards to switching from Coreg to Lopressor.   3. HLD - FLP in 10/2018 showed total cholesterol of 216, HDL 34 and triglycerides 567 with LDL unable to be calculated. He has been intolerant to Crestor and remains on low-dose Atorvastatin  daily.   4. IDDM - followed by  Endocrinology. Hgb A1c previously 12.2 earlier this year, now improved to 8.8 on Horn recent check.    For questions or updates, please contact [facility] Please consult [email or web address] for contact info under Cardiology/STEMI.  Signed, Erma Heritage, PA-C 06/12/2019, 7:52 AM Pager: [phone]   Attending note:  Patient seen and examined.  I reviewed his records and discussed the case with Ms. Ahmed Prima PA-C.  I agree with her above findings and recommendations.  Patient seen in the past by Dr. Nolon Lennert and was scheduled to be seen by Dr. Harriet Masson in a few days for evaluation of palpitations.  He presents to the ER with recurring palpitations, Horn recently prolonged and associated with lightheadedness and chest discomfort.  Reportedly his pulse rate was above 200 by his check at home, this resolved spontaneously but under observation in the ER he did have an episode of probable aberrant  SVT converting to narrow complex rhythm at same rate and spontaneously sinus rhythm.  The actual tracing looks concerning for VT initially, but does transition fairly well into a narrow complex rhythm (plan to have EP review).  He did not require any intervention for this arrhythmia.  He has no history of cardiomyopathy or ischemic heart disease, but does have cardiac risk factors.  None of these arrhythmias were caught by twelve-lead ECG.  On examination he appears comfortable, in sinus rhythm by telemetry with occasional PACs.  He is afebrile.  Heart rate in the 76H, systolic blood pressure [phone].  Lungs are clear without labored breathing.  Cardiac exam reveals RRR without gallop, occasional ectopic beat noted.  No peripheral edema.  Lab work shows potassium 3.9, BUN 16, creatinine 1.14, high-sensitivity troponin I levels increasing from 4, 63, 142.  Hemoglobin 16.5, platelets 307.  I personally reviewed his ECG which shows sinus rhythm with PACs, normal intervals.  No preexcitation or Brugada pattern.  Chest x-ray from November 2 reports no acute process.  Patient being admitted to the hospitalist service for further observation.  Continue telemetry, follow-up limited echocardiogram to ensure normal LVEF in comparison from prior study in March.  We will have strips reviewed by EP.  Change from Coreg to Toprol-XL.  The high-sensitivity troponin I levels are in range more consistent with demand ischemia rather than ACS.  Anticipate that he will at least need an outpatient cardiac monitor and referral for EP consultation, will make further recommendations if plan changes.  Satira Sark, M.D., F.A.C.C.

## 2019-06-12 NOTE — H&P (Signed)
History and Physical    Sean Horn DOB: Feb 04, 1961 DOA: 06/12/2019  PCP: Pearline Cables, MD   Patient coming from: Home  Chief Complaint: Chest pain and tachycardia  HPI: Sean Horn is a 58 y.o. male with medical history significant for hypertension, dyslipidemia, and insulin-dependent diabetes mellitus who presented to the ED back in 06/08/2019 for evaluation of palpitations and syncope.  He was apparently discharged home at that time and referred to cardiology as an outpatient.  Before he can make it to his appointment on 11/10, he presented to the ED with recurrent palpitations and chest discomfort.  He has had increasing palpitations over the last [redacted] weeks along with some associated dizziness.  He has even had some presyncopal episodes and actually passed out once upon walking to his truck.  He did not come to the ED during that incident, but last night he awoke from sleep with recurrent palpitations and noted that his pulse rate was above 200 and therefore he called EMS.  He does get chest pain occasionally no shortness of breath is noted.  He has had no recent changes to his medication regimen.  He denies any shortness of breath, fevers, chills, or cough.   ED Course: Vital signs demonstrated some tachyarrhythmias particularly with ambulation which resolved with rest.  He has WBC count 11.5, sodium 134, potassium 3.9, and creatinine 1.14 with glucose of 228.  Platelets are 307.  Initial high-sensitivity troponin at 4 with repeat value of 63.  EKG demonstrated sinus tachycardia with heart rate 101 with PACs and no acute ST abnormalities.  Telemetry did demonstrate some VT or possible SVT with aberrancy as well.  Review of Systems: All others reviewed and otherwise negative except as noted above.  Past Medical History:  Diagnosis Date   Anxiety    Chronic headaches    Chronic neck pain    GERD (gastroesophageal reflux disease)    occasional - OTC as needed     History of chicken pox    History of kidney stones    History of shingles    Hypertension    under control with med., had been on med. since age 61   Insomnia 09/13/2016   Insulin dependent diabetes mellitus    Follows with endocrinology, Dr Chestine Spore reports his last hgba1c was 7.3    Non-insulin dependent type 2 diabetes mellitus (HCC)    Obesity 01/30/2017   Occipital neuralgia    Osteoarthritis    right knee   PONV (postoperative nausea and vomiting)    PONV (postoperative nausea and vomiting)    Preventative health care 08/14/2013   Tachycardia    Urinary frequency 12/11/2016    Past Surgical History:  Procedure Laterality Date   ANTERIOR CERVICAL DECOMP/DISCECTOMY FUSION N/A 01/16/2018   Procedure: ANTERIOR CERVICAL DECOMPRESSION/DISCECTOMY FUSION CERVICAL FOUR-FIVE, CERVICAL FIVE-SIX;  Surgeon: Lisbeth Renshaw, MD;  Location: [facility] OR;  Service: Neurosurgery;  Laterality: N/A;  ANTERIOR CERVICAL DECOMPRESSION/DISCECTOMY FUSION CERVICAL FOUR-FIVE, CERVICAL FIVE-SIX   CHOLECYSTECTOMY  1980's   CYSTOSCOPY/RETROGRADE/URETEROSCOPY/STONE EXTRACTION WITH BASKET Right 08/27/2003   and double J stent placement   INGUINAL HERNIA REPAIR Left 1990's   KNEE ARTHROSCOPY Left 1980's   KNEE ARTHROSCOPY Right 2013   KNEE ARTHROSCOPY Right 06/30/2013   Procedure: RIGHT KNEE ARTHROSCOPY AND SYNOVECTOMY;  Surgeon: Velna Ochs, MD;  Location: [facility];  Service: Orthopedics;  Laterality: Right;   LITHOTRIPSY     multiple, b/l   SHOULDER ADHESION RELEASE Left ` 1998  SHOULDER ARTHROSCOPY W/ ROTATOR CUFF REPAIR Left 10/20/2004   x 4, had to have part of clavile removed. torn rotator cuff multiple times, had to place screws    SHOULDER ARTHROSCOPY W/ SUPERIOR LABRAL ANTERIOR POSTERIOR LESION REPAIR Left 12/22/2004   TOTAL KNEE ARTHROPLASTY Right 12/23/2012   x3, 2 arthroscopies and a TKR,  TOTAL KNEE ARTHROPLASTY;  Surgeon: Hessie Dibble, MD;  Location:  [place];  Service: Orthopedics;  Laterality: Right;  DEPUY, RNFA   WRIST SURGERY Right    x 3, torn ligaments, pins placed then infected, then fused with plate     reports that he has never smoked. His smokeless tobacco use includes snuff. He reports that he does not drink alcohol or use drugs.  Allergies  Allergen Reactions   Rosuvastatin Other (See Comments)   Toradol [Ketorolac Tromethamine] Itching    Family History  Problem Relation Age of Onset   COPD Mother        smoker   Hypertension Mother    Heart disease Mother        CHF   Diabetes Father    Heart disease Father        CABG in 85's   Stroke Father    Hypertension Father    Peripheral vascular disease Father    Hypertension Son        tachycardia   Heart disease Maternal Grandfather    Arthritis Paternal Grandmother    Colon cancer Neg Hx    Rectal cancer Neg Hx    Stomach cancer Neg Hx     Prior to Admission medications   Medication Sig Start Date End Date Taking? Authorizing Provider  atorvastatin (LIPITOR) 10 MG tablet Take 0.5 tablets (5 mg total) by mouth every other day. 05/05/19  Yes Mosie Lukes, MD  carvedilol (COREG) 12.5 MG tablet Take 1 tablet (12.5 mg total) by mouth 2 (two) times daily with a meal. 06/01/19  Yes Copland, Gay Filler, MD  Coenzyme Q10 (COQ-10) 75 MG CAPS Take 75 mg by mouth daily. 11/06/18  Yes Mosie Lukes, MD  escitalopram (LEXAPRO) 20 MG tablet Take 1 tablet by mouth once daily 05/04/19  Yes Mosie Lukes, MD  EUTHYROX 25 MCG tablet TAKE 1 TABLET BY MOUTH ONCE DAILY BEFORE BREAKFAST 05/04/19  Yes Mosie Lukes, MD  HYDROcodone-acetaminophen (NORCO/VICODIN) 5-325 MG tablet Take 1 tablet by mouth every 6 (six) hours as needed for moderate pain. 05/29/19  Yes Carole Civil, MD  insulin aspart (NOVOLOG FLEXPEN) 100 UNIT/ML FlexPen Inject 25-35 units 3 times daily with meals Patient taking differently: Inject 10 Units into the skin 3 (three) times daily with  meals.  07/01/18  Yes Philemon Kingdom, MD  insulin detemir (LEVEMIR) 100 UNIT/ML injection Inject 0.75 mLs (75 Units total) into the skin at bedtime. Patient taking differently: Inject 60 Units into the skin at bedtime.  07/01/18  Yes Philemon Kingdom, MD  lansoprazole (PREVACID) 30 MG capsule Take 30 mg by mouth every morning.    Yes [provider]  lisinopril (ZESTRIL) 10 MG tablet Take 1 tablet (10 mg total) by mouth daily. 05/25/19  Yes Copland, Gay Filler, MD  loratadine (CLARITIN) 10 MG tablet Take 10 mg by mouth every morning.    Yes [provider]  metFORMIN (GLUCOPHAGE-XR) 500 MG 24 hr tablet Take 1 tablet (500 mg total) by mouth 2 (two) times daily with a meal. 07/01/18  Yes Philemon Kingdom, MD  naproxen (NAPROSYN) 250 MG tablet Take  250 mg by mouth 2 (two) times daily with a meal.   Yes [provider]  topiramate (TOPAMAX) 50 MG tablet Take 1 tablet (50 mg total) by mouth at bedtime. 11/18/18  Yes Anson Fret, MD  Continuous Blood Gluc Sensor (FREESTYLE LIBRE 14 DAY SENSOR) MISC 1 each by Does not apply route every 14 (fourteen) days. Change every 2 weeks 05/19/19   Carlus Pavlov, MD  Dulaglutide (TRULICITY) 1.5 MG/0.5ML SOPN Inject 1.5 mg into the skin once a week. NEED APPOINTMENT FOR FURTHER REFILLS 05/04/19   Carlus Pavlov, MD    Physical Exam: Vitals:   06/12/19 0658 06/12/19 0700 06/12/19 0731 06/12/19 1215  BP:  (!) 129/112 101/70 128/78  Pulse: (!) 179 91 81 79  Resp: (!) 25 (!) 23 (!) 21 17  Temp:    98.2 F (36.8 C)  TempSrc:    Oral  SpO2: 99% 99% 97% 99%  Weight:      Height:        Constitutional: NAD, calm, comfortable Vitals:   06/12/19 0658 06/12/19 0700 06/12/19 0731 06/12/19 1215  BP:  (!) 129/112 101/70 128/78  Pulse: (!) 179 91 81 79  Resp: (!) 25 (!) 23 (!) 21 17  Temp:    98.2 F (36.8 C)  TempSrc:    Oral  SpO2: 99% 99% 97% 99%  Weight:      Height:       Eyes: lids and conjunctivae normal ENMT:  Mucous membranes are moist.  Neck: normal, supple Respiratory: clear to auscultation bilaterally. Normal respiratory effort. No accessory muscle use.  Cardiovascular: Irregular rate and rhythm, no murmurs. No extremity edema. Abdomen: no tenderness, no distention. Bowel sounds positive.  Musculoskeletal:  No joint deformity upper and lower extremities.   Skin: no rashes, lesions, ulcers.  Psychiatric: Normal judgment and insight. Alert and oriented x 3. Normal mood.   Labs on Admission: I have personally reviewed following labs and imaging studies  CBC: Recent Labs  Lab 06/08/19 1226 06/12/19 0331  WBC 7.7 11.5*  NEUTROABS 4.2 5.8  HGB 17.3* 16.5  HCT 49.5 46.5  MCV 89.2 88.6  PLT 310 307   Basic Metabolic Panel: Recent Labs  Lab 06/08/19 1226 06/12/19 0331  NA 139 134*  K 4.4 3.9  CL 103 102  CO2 24 23  GLUCOSE 161* 228*  BUN 10 16  CREATININE 0.98 1.14  CALCIUM 9.0 9.2   GFR: Estimated Creatinine Clearance: 87.6 mL/min (by C-G formula based on SCr of 1.14 mg/dL). Liver Function Tests: Recent Labs  Lab 06/08/19 1226  AST 17  ALT 25  ALKPHOS 66  BILITOT 1.9*  PROT 7.6  ALBUMIN 4.4   No results for input(s): LIPASE, AMYLASE in the last 168 hours. No results for input(s): AMMONIA in the last 168 hours. Coagulation Profile: No results for input(s): INR, PROTIME in the last 168 hours. Cardiac Enzymes: No results for input(s): CKTOTAL, CKMB, CKMBINDEX, TROPONINI in the last 168 hours. BNP (last 3 results) No results for input(s): PROBNP in the last 8760 hours. HbA1C: No results for input(s): HGBA1C in the last 72 hours. CBG: No results for input(s): GLUCAP in the last 168 hours. Lipid Profile: No results for input(s): CHOL, HDL, LDLCALC, TRIG, CHOLHDL, LDLDIRECT in the last 72 hours. Thyroid Function Tests: No results for input(s): TSH, T4TOTAL, FREET4, T3FREE, THYROIDAB in the last 72 hours. Anemia Panel: No results for input(s): VITAMINB12, FOLATE,  FERRITIN, TIBC, IRON, RETICCTPCT in the last 72 hours. Urine analysis:  Component Value Date/Time   COLORURINE YELLOW 11/03/2018 2115   APPEARANCEUR CLEAR 11/03/2018 2115   LABSPEC 1.033 (H) 11/03/2018 2115   PHURINE 5.0 11/03/2018 2115   GLUCOSEU >=500 (A) 11/03/2018 2115   GLUCOSEU >=1000 (A) 12/11/2016 1622   HGBUR NEGATIVE 11/03/2018 2115   BILIRUBINUR NEGATIVE 11/03/2018 2115   KETONESUR 5 (A) 11/03/2018 2115   PROTEINUR 30 (A) 11/03/2018 2115   UROBILINOGEN 0.2 12/11/2016 1622   NITRITE NEGATIVE 11/03/2018 2115   LEUKOCYTESUR NEGATIVE 11/03/2018 2115    Radiological Exams on Admission: No results found.  EKG: Independently reviewed.  Sinus tachycardia 101 bpm.  Assessment/Plan Active Problems:   * No active hospital problems. *    Symptomatic narrow complex tachycardia -Appreciate cardiology recommendations with 2D echocardiogram performed that has now resulted demonstrating LVEF 55-60% -Plan to continue on Toprol-XL -Plan for Holter monitoring on discharge with follow-up to EP.  Hypertension -Soft blood pressures noted -Plan to hold lisinopril -Changed from Coreg to Toprol-XL  Dyslipidemia -Continue low-dose atorvastatin 5 mg daily  Insulin-dependent diabetes mellitus -Hemoglobin A1c recently 8.8% -Follows with endocrinology -Continue home medications  GERD -Continue PPI  DVT prophylaxis: Lovenox Code Status: Full code Family Communication: None at bedside Disposition Plan: Plan for further evaluation with 2D echocardiogram Consults called: Cardiology Admission status: Telemetry, observation   Ryker Pherigo Hoover BrunetteD Jazzlin Clements DO [facility] Pager [phone]  If 7PM-7AM, please contact night-coverage [email or web address] Password [email or web address]  06/12/2019, 3:20 PM

## 2019-06-12 NOTE — Telephone Encounter (Signed)
Placed inpatient monitor per Bernerd Pho- PA-C. 06/12/2019

## 2019-06-12 NOTE — Discharge Summary (Signed)
Physician Discharge Summary  MARQUICE UDDIN HUD:[id number] DOB: 09-Jan-1961 DOA: 06/12/2019  PCP: Pearline Cables, MD  Admit date: 06/12/2019  Discharge date: 06/12/2019  Admitted From:Home  Disposition:  Home  Recommendations for Outpatient Follow-up:  1. Follow up with PCP in 1-2 weeks 2. Follow-up with Dr. Sharrell Ku as scheduled on 07/14/2019 at 8:45 AM 3. Patient will discharge with cardiac monitor 4. Continue on Toprol-XL daily and discontinue home Coreg and lisinopril 5. Continue other home medications as prior  Home Health: None  Equipment/Devices: None  Discharge Condition: Stable  CODE STATUS: Full  Diet recommendation: Heart Healthy/carb modified  Brief/Interim Summary: Per HPI: Sean Horn is a 58 y.o. male with medical history significant for hypertension, dyslipidemia, and insulin-dependent diabetes mellitus who presented to the ED back in 06/08/2019 for evaluation of palpitations and syncope.  He was apparently discharged home at that time and referred to cardiology as an outpatient.  Before he can make it to his appointment on 11/10, he presented to the ED with recurrent palpitations and chest discomfort.  He has had increasing palpitations over the last [redacted] weeks along with some associated dizziness.  He has even had some presyncopal episodes and actually passed out once upon walking to his truck.  He did not come to the ED during that incident, but last night he awoke from sleep with recurrent palpitations and noted that his pulse rate was above 200 and therefore he called EMS.  He does get chest pain occasionally no shortness of breath is noted.  He has had no recent changes to his medication regimen.  He denies any shortness of breath, fevers, chills, or cough.  Patient underwent 2D echocardiogram with LVEF 55-60% with grade 2 diastolic dysfunction as well as moderate LVH noted.  No other acute findings have been identified aside from symptomatic narrow complex  tachycardia with some possible V. tach or SVT with aberrancy.  He will require home Holter monitoring which will be placed prior to him discharging and he will have EP follow-up as noted above.  He will now be on metoprolol XL with no further Coreg or lisinopril indicated given his softer blood pressure readings.  He is otherwise stable for discharge at this point in time with stable heart rates noted.  Discharge Diagnoses:  Active Problems:   Palpitations  Principal discharge diagnosis: Palpitations secondary to symptomatic narrow complex tachycardia.  Discharge Instructions  Discharge Instructions    Diet - low sodium heart healthy   Complete by: As directed    Increase activity slowly   Complete by: As directed      Allergies as of 06/12/2019      Reactions   Rosuvastatin Other (See Comments)   Toradol [ketorolac Tromethamine] Itching      Medication List    STOP taking these medications   carvedilol 12.5 MG tablet Commonly known as: COREG   lisinopril 10 MG tablet Commonly known as: ZESTRIL     TAKE these medications   atorvastatin 10 MG tablet Commonly known as: LIPITOR Take 0.5 tablets (5 mg total) by mouth every other day.   CoQ-10 75 MG Caps Take 75 mg by mouth daily.   escitalopram 20 MG tablet Commonly known as: LEXAPRO Take 1 tablet by mouth once daily   Euthyrox 25 MCG tablet Generic drug: levothyroxine TAKE 1 TABLET BY MOUTH ONCE DAILY BEFORE BREAKFAST   FreeStyle Libre 14 Day Sensor Misc 1 each by Does not apply route every 14 (fourteen) days. Change  every 2 weeks   HYDROcodone-acetaminophen 5-325 MG tablet Commonly known as: NORCO/VICODIN Take 1 tablet by mouth every 6 (six) hours as needed for moderate pain.   insulin aspart 100 UNIT/ML FlexPen Commonly known as: NovoLOG FlexPen Inject 25-35 units 3 times daily with meals What changed:   how much to take  how to take this  when to take this  additional instructions   insulin detemir  100 UNIT/ML injection Commonly known as: Levemir Inject 0.75 mLs (75 Units total) into the skin at bedtime. What changed: how much to take   lansoprazole 30 MG capsule Commonly known as: PREVACID Take 30 mg by mouth every morning.   loratadine 10 MG tablet Commonly known as: CLARITIN Take 10 mg by mouth every morning.   metFORMIN 500 MG 24 hr tablet Commonly known as: GLUCOPHAGE-XR Take 1 tablet (500 mg total) by mouth 2 (two) times daily with a meal.   metoprolol succinate 25 MG 24 hr tablet Commonly known as: TOPROL-XL Take 1 tablet (25 mg total) by mouth daily. Start taking on: June 13, 2019   naproxen 250 MG tablet Commonly known as: NAPROSYN Take 250 mg by mouth 2 (two) times daily with a meal.   topiramate 50 MG tablet Commonly known as: Topamax Take 1 tablet (50 mg total) by mouth at bedtime.   Trulicity 1.5 MG/0.5ML Sopn Generic drug: Dulaglutide Inject 1.5 mg into the skin once a week. NEED APPOINTMENT FOR FURTHER REFILLS      Follow-up Information    Marinus Mawaylor, Gregg W, MD Follow up on 07/14/2019.   Specialty: Cardiology Why: Cardiology Follow-up on 07/14/2019 at 8:45 AM.  Contact information: [address] [place] [id number] [phone]        Copland, Gwenlyn FoundJessica C, MD Follow up in 1 week(s).   Specialty: Family Medicine Contact information: [address] [place] [id number] [phone]          Allergies  Allergen Reactions  . Rosuvastatin Other (See Comments)  . Toradol [Ketorolac Tromethamine] Itching    Consultations:  Cardiology   Procedures/Studies: Dg Shoulder Right  Result Date: 05/29/2019 Right shoulder 2 views AP lateral Glenohumeral joint looks good but there is a subtle proximal migration of the humerus with greater tuberosity sclerosis type I acromion AC joint looks clean acromion seems to have some bony overgrowth Impression normal glenohumeral joint with slight proximal migration of the humerus  suggestive of chronic rotator cuff tear  Mr Shoulder Right Wo Contrast  Result Date: 06/09/2019 CLINICAL DATA:  Right shoulder pain for 7 months.  No known injury. EXAM: MRI OF THE RIGHT SHOULDER WITHOUT CONTRAST TECHNIQUE: Multiplanar, multisequence MR imaging of the shoulder was performed. No intravenous contrast was administered. COMPARISON:  None. FINDINGS: Rotator cuff: Severe tendinosis of the supraspinatus tendon with a small partial-thickness articular surface tear. Infraspinatus tendon is intact. Teres minor tendon is intact. Subscapularis tendon is intact. Muscles: No atrophy or fatty replacement of nor abnormal signal within, the muscles of the rotator cuff. Biceps long head: Moderate tendinosis of the intra-articular portion of the long head of the biceps tendon. Acromioclavicular Joint: Moderate arthropathy of the acromioclavicular joint. Type I acromion. No subacromial/subdeltoid bursal fluid. Glenohumeral Joint: No joint effusion. No chondral defect. Labrum: Grossly intact, but evaluation is limited by lack of intraarticular fluid. Bones:  No acute osseous abnormality.  No aggressive osseous lesion. Other: No fluid collection or hematoma. IMPRESSION: 1. Severe tendinosis of the supraspinatus tendon with a small partial-thickness articular  surface tear. 2. Moderate tendinosis of the intra-articular portion of the long head of the biceps tendon. Electronically Signed   By: Kathreen Devoid   On: 06/09/2019 08:30   Dg Chest Port 1 View  Result Date: 06/08/2019 CLINICAL DATA:  Chest pain, syncopal episode EXAM: PORTABLE CHEST 1 VIEW COMPARISON:  12/16/2012 FINDINGS: The heart size and mediastinal contours are within normal limits. Both lungs are clear. The visualized skeletal structures are unremarkable. IMPRESSION: No active disease. Electronically Signed   By: Kathreen Devoid   On: 06/08/2019 13:08     Discharge Exam: Vitals:   06/12/19 0731 06/12/19 1215  BP: 101/70 128/78  Pulse: 81 79   Resp: (!) 21 17  Temp:  98.2 F (36.8 C)  SpO2: 97% 99%   Vitals:   06/12/19 0658 06/12/19 0700 06/12/19 0731 06/12/19 1215  BP:  (!) 129/112 101/70 128/78  Pulse: (!) 179 91 81 79  Resp: (!) 25 (!) 23 (!) 21 17  Temp:    98.2 F (36.8 C)  TempSrc:    Oral  SpO2: 99% 99% 97% 99%  Weight:      Height:        General: Pt is alert, awake, not in acute distress Cardiovascular: Irregular, but rate controlled S1/S2 +, no rubs, no gallops Respiratory: CTA bilaterally, no wheezing, no rhonchi Abdominal: Soft, NT, ND, bowel sounds + Extremities: no edema, no cyanosis    The results of significant diagnostics from this hospitalization (including imaging, microbiology, ancillary and laboratory) are listed below for reference.     Microbiology: No results found for this or any previous visit (from the past 240 hour(s)).   Labs: BNP (last 3 results) Recent Labs    11/03/18 2131  BNP 31.5   Basic Metabolic Panel: Recent Labs  Lab 06/08/19 1226 06/12/19 0331  NA 139 134*  K 4.4 3.9  CL 103 102  CO2 24 23  GLUCOSE 161* 228*  BUN 10 16  CREATININE 0.98 1.14  CALCIUM 9.0 9.2   Liver Function Tests: Recent Labs  Lab 06/08/19 1226  AST 17  ALT 25  ALKPHOS 66  BILITOT 1.9*  PROT 7.6  ALBUMIN 4.4   No results for input(s): LIPASE, AMYLASE in the last 168 hours. No results for input(s): AMMONIA in the last 168 hours. CBC: Recent Labs  Lab 06/08/19 1226 06/12/19 0331  WBC 7.7 11.5*  NEUTROABS 4.2 5.8  HGB 17.3* 16.5  HCT 49.5 46.5  MCV 89.2 88.6  PLT 310 307   Cardiac Enzymes: No results for input(s): CKTOTAL, CKMB, CKMBINDEX, TROPONINI in the last 168 hours. BNP: Invalid input(s): POCBNP CBG: No results for input(s): GLUCAP in the last 168 hours. D-Dimer No results for input(s): DDIMER in the last 72 hours. Hgb A1c No results for input(s): HGBA1C in the last 72 hours. Lipid Profile No results for input(s): CHOL, HDL, LDLCALC, TRIG, CHOLHDL,  LDLDIRECT in the last 72 hours. Thyroid function studies No results for input(s): TSH, T4TOTAL, T3FREE, THYROIDAB in the last 72 hours.  Invalid input(s): FREET3 Anemia work up No results for input(s): VITAMINB12, FOLATE, FERRITIN, TIBC, IRON, RETICCTPCT in the last 72 hours. Urinalysis    Component Value Date/Time   COLORURINE YELLOW 11/03/2018 2115   APPEARANCEUR CLEAR 11/03/2018 2115   LABSPEC 1.033 (H) 11/03/2018 2115   PHURINE 5.0 11/03/2018 2115   GLUCOSEU >=500 (A) 11/03/2018 2115   GLUCOSEU >=1000 (A) 12/11/2016 1622   HGBUR NEGATIVE 11/03/2018 2115   BILIRUBINUR NEGATIVE 11/03/2018 2115  KETONESUR 5 (A) 11/03/2018 2115   PROTEINUR 30 (A) 11/03/2018 2115   UROBILINOGEN 0.2 12/11/2016 1622   NITRITE NEGATIVE 11/03/2018 2115   LEUKOCYTESUR NEGATIVE 11/03/2018 2115   Sepsis Labs Invalid input(s): PROCALCITONIN,  WBC,  LACTICIDVEN Microbiology No results found for this or any previous visit (from the past 240 hour(s)).   Time coordinating discharge: 40 minutes  SIGNED:   Erick Blinks, DO [facility] 06/12/2019, 3:53 PM  If 7PM-7AM, please contact night-coverage [email or web address]

## 2019-06-12 NOTE — ED Notes (Signed)
Spoke with cardiology office and was advised that they will come place monitor for pt after 4.  Pt will be discharged once this is in place

## 2019-06-12 NOTE — ED Notes (Signed)
Pt called out to use the urinal, when pt stood up pts HR spiked to 160, pt laid back down & HR returned to normal, MD notified

## 2019-06-12 NOTE — ED Triage Notes (Signed)
Pt c/o heart racing off and on for a couple of days, seen here this week for same.  Pt also c/o chest pressure.

## 2019-06-15 ENCOUNTER — Encounter: Payer: Self-pay | Admitting: Family Medicine

## 2019-06-15 ENCOUNTER — Other Ambulatory Visit: Payer: Self-pay

## 2019-06-15 ENCOUNTER — Ambulatory Visit: Payer: Medicare HMO | Admitting: Orthopedic Surgery

## 2019-06-15 ENCOUNTER — Telehealth: Payer: Self-pay | Admitting: *Deleted

## 2019-06-15 ENCOUNTER — Encounter: Payer: Self-pay | Admitting: Orthopedic Surgery

## 2019-06-15 VITALS — BP 143/97 | HR 101 | Ht 72.0 in | Wt 217.0 lb

## 2019-06-15 DIAGNOSIS — M75111 Incomplete rotator cuff tear or rupture of right shoulder, not specified as traumatic: Secondary | ICD-10-CM | POA: Diagnosis not present

## 2019-06-15 MED ORDER — HYDROCODONE-ACETAMINOPHEN 5-325 MG PO TABS: 1.0000 | ORAL_TABLET | Freq: Two times a day (BID) | ORAL | 0 refills | Status: AC | PRN

## 2019-06-15 NOTE — Telephone Encounter (Signed)
Patient was next scheduled by front office staff to come in for appointment for results for 06/15/19.  I spoke with Dr Edwena Bunde this appointment for in person visit is okay. I called patient; confirmed.

## 2019-06-15 NOTE — Progress Notes (Signed)
Chief Complaint  Patient presents with  . Shoulder Pain    right / review MRI     58 years old had a recent tachycardia is currently on a Holter monitor with MRI showing partial articular surface tear rotator cuff with AC joint arthritis  Still having right shoulder pain takes 1 hydrocodone at night  Review of Systems  Constitutional: Negative for chills and fever.  Neurological: Negative for tingling and tremors.     CLINICAL DATA:  Right shoulder pain for 7 months.  No known injury.   EXAM: MRI OF THE RIGHT SHOULDER WITHOUT CONTRAST   TECHNIQUE: Multiplanar, multisequence MR imaging of the shoulder was performed. No intravenous contrast was administered.   COMPARISON:  None.   FINDINGS: Rotator cuff: Severe tendinosis of the supraspinatus tendon with a small partial-thickness articular surface tear. Infraspinatus tendon is intact. Teres minor tendon is intact. Subscapularis tendon is intact.   Muscles: No atrophy or fatty replacement of nor abnormal signal within, the muscles of the rotator cuff.   Biceps long head: Moderate tendinosis of the intra-articular portion of the long head of the biceps tendon.   Acromioclavicular Joint: Moderate arthropathy of the acromioclavicular joint. Type I acromion. No subacromial/subdeltoid bursal fluid.   Glenohumeral Joint: No joint effusion. No chondral defect.   Labrum: Grossly intact, but evaluation is limited by lack of intraarticular fluid.   Bones:  No acute osseous abnormality.  No aggressive osseous lesion.   Other: No fluid collection or hematoma.   IMPRESSION: 1. Severe tendinosis of the supraspinatus tendon with a small partial-thickness articular surface tear. 2. Moderate tendinosis of the intra-articular portion of the long head of the biceps tendon.     Electronically Signed   By: Kathreen Devoid   On: 06/09/2019 08:30  Encounter Diagnosis  Name Primary?  Sean Horn Nontraumatic incomplete tear of right rotator  cuff Yes    I agree with the report after reviewing the images that he does have a partial cuff tear  I discussed discussed this with Juanda Crumble and he will continue his cardiac treatment and after 30 days or when the doctor gives him clearance he can go to physical therapy  Meds ordered this encounter  Medications  . HYDROcodone-acetaminophen (NORCO/VICODIN) 5-325 MG tablet    Sig: Take 1 tablet by mouth every 12 (twelve) hours as needed for up to 7 days for moderate pain.    Dispense:  14 tablet    Refill:  0

## 2019-06-15 NOTE — Telephone Encounter (Signed)
Transition Care Management Follow-up Telephone Call   Date discharged?06/12/19   How have you been since you were released from the hospital? Doing well    Do you understand why you were in the hospital? yes   Do you understand the discharge instructions? yes   Where were you discharged to? Home w his father   Items Reviewed:  Medications reviewed: "they added one. Metoprolol 25mg  qd."  Allergies reviewed: yes  Dietary changes reviewed: yes  Referrals reviewed: yes   Functional Questionnaire:   Activities of Daily Living (ADLs):   He states they are independent in the following: ambulation, bathing and hygiene, feeding, continence, grooming, toileting and dressing States they require assistance with the following: na   Any transportation issues/concerns?: no   Any patient concerns? no  Pt states he will call to schedule appt w/ PCP after following up w/ cardiology tomorrow.  Confirmed with patient if condition begins to worsen call PCP or go to the ER.  Patient was given the office number and encouraged to call back with question or concerns.  : yes

## 2019-06-15 NOTE — Patient Instructions (Signed)
Call us in 30 days or when the heart dr says its ok to do therapy

## 2019-06-16 ENCOUNTER — Ambulatory Visit (INDEPENDENT_AMBULATORY_CARE_PROVIDER_SITE_OTHER): Payer: Medicare HMO | Admitting: Cardiology

## 2019-06-16 ENCOUNTER — Encounter: Payer: Self-pay | Admitting: Cardiology

## 2019-06-16 VITALS — BP 104/82 | HR 96 | Ht 72.0 in | Wt 215.0 lb

## 2019-06-16 DIAGNOSIS — I491 Atrial premature depolarization: Secondary | ICD-10-CM

## 2019-06-16 DIAGNOSIS — E781 Pure hyperglyceridemia: Secondary | ICD-10-CM | POA: Diagnosis not present

## 2019-06-16 DIAGNOSIS — E782 Mixed hyperlipidemia: Secondary | ICD-10-CM

## 2019-06-16 DIAGNOSIS — G459 Transient cerebral ischemic attack, unspecified: Secondary | ICD-10-CM | POA: Diagnosis not present

## 2019-06-16 DIAGNOSIS — R072 Precordial pain: Secondary | ICD-10-CM | POA: Diagnosis not present

## 2019-06-16 DIAGNOSIS — Z01812 Encounter for preprocedural laboratory examination: Secondary | ICD-10-CM | POA: Diagnosis not present

## 2019-06-16 DIAGNOSIS — E118 Type 2 diabetes mellitus with unspecified complications: Secondary | ICD-10-CM

## 2019-06-16 DIAGNOSIS — I10 Essential (primary) hypertension: Secondary | ICD-10-CM | POA: Diagnosis not present

## 2019-06-16 DIAGNOSIS — Z794 Long term (current) use of insulin: Secondary | ICD-10-CM | POA: Diagnosis not present

## 2019-06-16 NOTE — Progress Notes (Signed)
Cardiology Office Note:    Date:  06/16/2019   ID:  Sean Horn, DOB May 24, 1961, MRN [id number]  PCP:  Pearline Cables, MD  Cardiologist:  No primary care provider on file.  Electrophysiologist:  None   Referring MD: Pearline Cables, MD   Chief Complaint  Patient presents with  . Loss of Consciousness  . Tachycardia    Wearing monitor    History of Present Illness:    Sean Horn is a 58 y.o. male with a hx of hypertension, diabetes mellitus, on 06/12/2019 to the emergency department.  The patient presented to be evaluated for chest pain palpitations.  Patient tells me that he started experience left-sided pressure-like sensation the night he presented to the ED.  He described as a heaviness in the left side of his chest and the midsternal area without radiation.  He notes that this lasted for about 45 minutes at minimum.  In addition he states it was more bothersome than the fact that he was experiencing increasing heart rate which went up to 220.  He tells me is a significant fluttering feeling.  He states that prior to going to the ED all of the symptoms had resolved.  During our visit echocardiogram was performed as well as event monitor was placed on the patient for 30 days.  He offers no complaints at this time.  During his visit he does not reports any chest pain.  Past Medical History:  Diagnosis Date  . Anxiety   . Chronic headaches   . Chronic neck pain   . GERD (gastroesophageal reflux disease)    occasional - OTC as needed  . History of chicken pox   . History of kidney stones   . History of shingles   . Hypertension    under control with med., had been on med. since age 53  . Insomnia 09/13/2016  . Insulin dependent diabetes mellitus    Follows with endocrinology, Dr Chestine Spore reports his last hgba1c was 7.3   . Non-insulin dependent type 2 diabetes mellitus (HCC)   . Obesity 01/30/2017  . Occipital neuralgia   . Osteoarthritis    right knee  . PONV  (postoperative nausea and vomiting)   . PONV (postoperative nausea and vomiting)   . Preventative health care 08/14/2013  . Tachycardia   . Urinary frequency 12/11/2016    Past Surgical History:  Procedure Laterality Date  . ANTERIOR CERVICAL DECOMP/DISCECTOMY FUSION N/A 01/16/2018   Procedure: ANTERIOR CERVICAL DECOMPRESSION/DISCECTOMY FUSION CERVICAL FOUR-FIVE, CERVICAL FIVE-SIX;  Surgeon: Lisbeth Renshaw, MD;  Location: [facility] OR;  Service: Neurosurgery;  Laterality: N/A;  ANTERIOR CERVICAL DECOMPRESSION/DISCECTOMY FUSION CERVICAL FOUR-FIVE, CERVICAL FIVE-SIX  . CHOLECYSTECTOMY  1980's  . CYSTOSCOPY/RETROGRADE/URETEROSCOPY/STONE EXTRACTION WITH BASKET Right 08/27/2003   and double J stent placement  . INGUINAL HERNIA REPAIR Left 1990's  . KNEE ARTHROSCOPY Left 1980's  . KNEE ARTHROSCOPY Right 2013  . KNEE ARTHROSCOPY Right 06/30/2013   Procedure: RIGHT KNEE ARTHROSCOPY AND SYNOVECTOMY;  Surgeon: Velna Ochs, MD;  Location: [facility];  Service: Orthopedics;  Laterality: Right;  . LITHOTRIPSY     multiple, b/l  . SHOULDER ADHESION RELEASE Left ` 1998  . SHOULDER ARTHROSCOPY W/ ROTATOR CUFF REPAIR Left 10/20/2004   x 4, had to have part of clavile removed. torn rotator cuff multiple times, had to place screws   . SHOULDER ARTHROSCOPY W/ SUPERIOR LABRAL ANTERIOR POSTERIOR LESION REPAIR Left 12/22/2004  . TOTAL KNEE ARTHROPLASTY Right 12/23/2012   x3,  2 arthroscopies and a TKR,  TOTAL KNEE ARTHROPLASTY;  Surgeon: Hessie Dibble, MD;  Location: [facility];  Service: Orthopedics;  Laterality: Right;  DEPUY, RNFA  . WRIST SURGERY Right    x 3, torn ligaments, pins placed then infected, then fused with plate    Current Medications: Current Meds  Medication Sig  . atorvastatin (LIPITOR) 10 MG tablet Take 0.5 tablets (5 mg total) by mouth every other day.  . Coenzyme Q10 (COQ-10) 75 MG CAPS Take 75 mg by mouth daily.  . Continuous Blood Gluc Sensor (FREESTYLE LIBRE 14 DAY SENSOR)  MISC 1 each by Does not apply route every 14 (fourteen) days. Change every 2 weeks  . Dulaglutide (TRULICITY) 1.5 OZ/3.6UY SOPN Inject 1.5 mg into the skin once a week. NEED APPOINTMENT FOR FURTHER REFILLS  . escitalopram (LEXAPRO) 20 MG tablet Take 1 tablet by mouth once daily  . EUTHYROX 25 MCG tablet TAKE 1 TABLET BY MOUTH ONCE DAILY BEFORE BREAKFAST  . HYDROcodone-acetaminophen (NORCO/VICODIN) 5-325 MG tablet Take 1 tablet by mouth every 12 (twelve) hours as needed for up to 7 days for moderate pain.  Marland Kitchen insulin aspart (NOVOLOG FLEXPEN) 100 UNIT/ML FlexPen Inject 25-35 units 3 times daily with meals (Patient taking differently: Inject 10 Units into the skin 3 (three) times daily with meals. )  . insulin detemir (LEVEMIR) 100 UNIT/ML injection Inject 0.75 mLs (75 Units total) into the skin at bedtime. (Patient taking differently: Inject 60 Units into the skin at bedtime. )  . lansoprazole (PREVACID) 30 MG capsule Take 30 mg by mouth every morning.   . loratadine (CLARITIN) 10 MG tablet Take 10 mg by mouth every morning.   . metFORMIN (GLUCOPHAGE-XR) 500 MG 24 hr tablet Take 1 tablet (500 mg total) by mouth 2 (two) times daily with a meal.  . metoprolol succinate (TOPROL-XL) 25 MG 24 hr tablet Take 1 tablet (25 mg total) by mouth daily.  . naproxen (NAPROSYN) 250 MG tablet Take 250 mg by mouth 2 (two) times daily with a meal.  . topiramate (TOPAMAX) 50 MG tablet Take 1 tablet (50 mg total) by mouth at bedtime.     Allergies:   Rosuvastatin and Toradol [ketorolac tromethamine]   Social History   Socioeconomic History  . Marital status: Single    Spouse name: Not on file  . Number of children: 2  . Years of education: Not on file  . Highest education level: High school graduate  Occupational History  . Occupation: Disabled  Social Needs  . Financial resource strain: Not on file  . Food insecurity    Worry: Not on file    Inability: Not on file  . Transportation needs    Medical: Not  on file    Non-medical: Not on file  Tobacco Use  . Smoking status: Never Smoker  . Smokeless tobacco: Current User    Types: Snuff  Substance and Sexual Activity  . Alcohol use: No    Frequency: Never  . Drug use: No  . Sexual activity: Yes  Lifestyle  . Physical activity    Days per week: Not on file    Minutes per session: Not on file  . Stress: Not on file  Relationships  . Social Herbalist on phone: Not on file    Gets together: Not on file    Attends religious service: Not on file    Active member of club or organization: Not on file    Attends  meetings of clubs or organizations: Not on file    Relationship status: Not on file  Other Topics Concern  . Not on file  Social History Narrative   Lives at home with his father (he takes care of his father)   Right handed   Drinks 2 cups of caffeine daily     Family History: The patient's family history includes Arthritis in his paternal grandmother; COPD in his mother; Diabetes in his father; Heart disease in his father, maternal grandfather, and mother; Hypertension in his father, mother, and son; Peripheral vascular disease in his father; Stroke in his father. There is no history of Colon cancer, Rectal cancer, or Stomach cancer.  ROS:   Review of Systems  Constitution: Negative for decreased appetite, fever and weight gain.  HENT: Negative for congestion, ear discharge, hoarse voice and sore throat.   Eyes: Negative for discharge, redness, vision loss in right eye and visual halos.  Cardiovascular: Negative for chest pain, dyspnea on exertion, leg swelling, orthopnea and palpitations.  Respiratory: Negative for cough, hemoptysis, shortness of breath and snoring.   Endocrine: Negative for heat intolerance and polyphagia.  Hematologic/Lymphatic: Negative for bleeding problem. Does not bruise/bleed easily.  Skin: Negative for flushing, nail changes, rash and suspicious lesions.  Musculoskeletal: Negative for  arthritis, joint pain, muscle cramps, myalgias, neck pain and stiffness.  Gastrointestinal: Negative for abdominal pain, bowel incontinence, diarrhea and excessive appetite.  Genitourinary: Negative for decreased libido, genital sores and incomplete emptying.  Neurological: Negative for brief paralysis, focal weakness, headaches and loss of balance.  Psychiatric/Behavioral: Negative for altered mental status, depression and suicidal ideas.  Allergic/Immunologic: Negative for HIV exposure and persistent infections.    EKGs/Labs/Other Studies Reviewed:    The following studies were reviewed today:   EKG:  The ekg ordered today demonstrates sinus rhythm, heart rate 88 bpm with premature atrial complexes  Transthoracic echocardiogram on 06/12/2019 IMPRESSIONS  1. Left ventricular ejection fraction, by visual estimation, is 55 to 60%. The left ventricle has normal function. There is moderately increased left ventricular hypertrophy.  2. Left ventricular diastolic parameters are consistent with Grade II diastolic dysfunction (pseudonormalization).  3. Global right ventricle has normal systolic function.The right ventricular size is normal. No increase in right ventricular wall thickness.  4. Left atrial size was normal.  5. Right atrial size was normal.  6. The mitral valve is grossly normal. Trace mitral valve regurgitation.  7. The tricuspid valve is grossly normal. Tricuspid valve regurgitation is trivial.  8. The aortic valve is tricuspid. Aortic valve regurgitation is not visualized.  9. The pulmonic valve was grossly normal. Pulmonic valve regurgitation is trivial. 10. Aortic dilatation noted. 11. There is mild dilatation of the ascending aorta. 12. TR signal is inadequate for assessing pulmonary artery systolic pressure. 13. The inferior vena cava is normal in size with greater than 50% respiratory variability, suggesting right atrial pressure of 3 mmHg.   Recent Labs: 11/03/2018: B  Natriuretic Peptide 16.0; Magnesium 1.9 06/08/2019: ALT 25; TSH 2.297 06/12/2019: BUN 16; Creatinine, Ser 1.14; Hemoglobin 16.5; Platelets 307; Potassium 3.9; Sodium 134  Recent Lipid Panel    Component Value Date/Time   CHOL 216 (H) 11/03/2018 2131   TRIG 567 (H) 11/03/2018 2131   HDL 34 (L) 11/03/2018 2131   CHOLHDL 6.4 11/03/2018 2131   VLDL UNABLE TO CALCULATE IF TRIGLYCERIDE OVER 400 mg/dL 16/05/9603 5409   LDLCALC UNABLE TO CALCULATE IF TRIGLYCERIDE OVER 400 mg/dL 81/19/1478 2956   LDLDIRECT 128.0 12/11/2016 1622  Physical Exam:    VS:  BP 104/82 (BP Location: Right Arm, Patient Position: Sitting, Cuff Size: Normal)   Pulse 96   Ht 6' (1.829 m)   Wt 215 lb (97.5 kg)   SpO2 (!) 85%   BMI 29.16 kg/m     Wt Readings from Last 3 Encounters:  06/16/19 215 lb (97.5 kg)  06/15/19 217 lb (98.4 kg)  06/12/19 220 lb 7.4 oz (100 kg)     GEN: Well nourished, well developed in no acute distress HEENT: Normal NECK: No JVD; No carotid bruits LYMPHATICS: No lymphadenopathy CARDIAC: S1S2 noted,RRR, no murmurs, rubs, gallops RESPIRATORY:  Clear to auscultation without rales, wheezing or rhonchi  ABDOMEN: Soft, non-tender, non-distended, +bowel sounds, no guarding. EXTREMITIES: No edema, No cyanosis, no clubbing MUSCULOSKELETAL:  No edema; No deformity  SKIN: Warm and dry NEUROLOGIC:  Alert and oriented x 3, non-focal PSYCHIATRIC:  Normal affect, good insight  ASSESSMENT:    1. Pre-procedure lab exam   2. Precordial pain   3. PAC (premature atrial contraction)   4. Essential hypertension   5. Hypertriglyceridemia   6. TIA (transient ischemic attack)   7. Type 2 diabetes mellitus with complication, with long-term current use of insulin (HCC)   8. Mixed hyperlipidemia    PLAN:     1. Chest Pain -he does have risk factor for coronary artery disease and ( hypertension, hyperlipidemia, coronary artery disease in family member and diabetes) with his symptoms I like to pursue an  ischemic evaluation.  I have ordered CT angio coronaries.  I have educated patient about this testing.  He denies any IV contrast allergy.  He is agreeable to proceed with this testing.  For now he will continue aspirin 1 mg daily as well as his atorvastatin.  2.  He had a ED visit for recent palpitations.  Today he does have PVCs on his EKG.  He was started on metoprolol XL 25 mg daily we will continue this for now.  He had a event monitor placed while in the ED.  I have asked the patient once he forward this information to the ordering physician I will like a copy of this.  He also tells me that he does have a visit on July 14, 2019 with our EP department.  3.  His blood pressure perspective in the office today.  Continue Toprol-XL 25 mg daily.  He states that he does take lisinopril however dosage not confirmed.  Have asked patient to please forward us this information.  4.  Hyperlipidemia-he will continue his current dose of atorvastatin.  I have reviewed his previous lipid profile March 2020 which is very impressive for triglyceride 567, LDL 128, HDL 34, total cholesterol [id number].  He is not fasting today, but of asked the patient to please come back fasting prior to his next visit to be able to get his lipid profile repeated.  I have discussed the possibility of increasing his Lipitor as well as hopefully starting him on Vascepa if his triglyceride does not improve.  5.  Diabetes mellitus-his recent hemoglobin A1c was 8.7 May 19, 2019 he states based on that information his PCP did adjust his insulin.  6.TIA - continue on Aspirin 81mg  daily and lipitor 10 mg daily.   The patient is in agreement with the above plan. The patient left the office in stable condition.  The patient will follow up in 2 months.   Medication Adjustments/Labs and Tests Ordered: Current medicines are reviewed at length  with the patient today.  Concerns regarding medicines are outlined above.  Orders Placed This  Encounter  Procedures  . CT CORONARY FRACTIONAL FLOW RESERVE DATA PREP  . CT CORONARY FRACTIONAL FLOW RESERVE FLUID ANALYSIS  . CT CORONARY MORPH W/CTA COR W/SCORE W/CA W/CM &/OR WO/CM  . Basic Metabolic Panel (BMET)  . Lipid Profile  . EKG 12-Lead   No orders of the defined types were placed in this encounter.   Patient Instructions  Medication Instructions:  Your physician recommends that you continue on your current medications as directed. Please refer to the Current Medication list given to you today.  *If you need a refill on your cardiac medications before your next appointment, please call your pharmacy*  Lab Work: Your physician recommends that you return for lab work in:   3-7 days prior to CT:BMP May have lipid drawn at same time.  If you have labs (blood work) drawn today and your tests are completely normal, you will receive your results only by: Marland Kitchen MyChart Message (if you have MyChart) OR . A paper copy in the mail If you have any lab test that is abnormal or we need to change your treatment, we will call you to review the results.  Testing/Procedures: Your physician has requested that you have cardiac CT. Cardiac computed tomography (CT) is a painless test that uses an x-ray machine to take clear, detailed pictures of your heart. For further information please visit [email or web address] Please follow instruction sheet as given.  Your cardiac CT will be scheduled at one of the below locations:   [facility] [address] [place], [place] [address] [phone]  If scheduled at [facility], please arrive at the [facility] main entrance of [facility] 30-45 minutes prior to test start time. Proceed to the [facility] Radiology Department (first floor) to check-in and test prep.  Please follow these instructions carefully (unless otherwise directed):  Hold all erectile dysfunction medications at least 3 days (72 hrs) prior to test.   On the Night Before the Test: . Be sure to Drink plenty of water. . Do not consume any caffeinated/decaffeinated beverages or chocolate 12 hours prior to your test. . Do not take any antihistamines 12 hours prior to your test.  On the Day of the Test: . Drink plenty of water. Do not drink any water within one hour of the test. . Do not eat any food 4 hours prior to the test. . You may take your regular medications prior to the test.  . Take metoprolol (Toprol XL) two hours prior to test. . Hold metformin and insulin day of test.      After the Test: . Drink plenty of water. . After receiving IV contrast, you may experience a mild flushed feeling. This is normal. . On occasion, you may experience a mild rash up to 24 hours after the test. This is not dangerous. If this occurs, you can take Benadryl 25 mg and increase your fluid intake. . If you experience trouble breathing, this can be serious. If it is severe call 911 IMMEDIATELY. If it is mild, please call our office. . If you take any of these medications: Glipizide/Metformin, Avandament, Glucavance, please do not take 48 hours after completing test unless otherwise instructed.   Once we have confirmed authorization from your insurance company, we will call you to set up a date and time for your test.   For non-scheduling related questions, please contact the  cardiac imaging nurse navigator should you have any questions/concerns: Rockwell Alexandria, RN Navigator Cardiac Imaging [facility] [phone] Office    Follow-Up: At [facility], you and your health needs are our priority.  As part of our continuing mission to provide you with exceptional heart care, we have created designated Provider Care Teams.  These Care Teams include your primary Cardiologist (physician) and Advanced Practice Providers (APPs -  Physician Assistants and Nurse Practitioners) who all work together to provide you with the care you  need, when you need it.  Your next appointment:   2 months  The format for your next appointment:   In Person  Provider:   Thomasene Ripple, DO  Other Instructions      Adopting a Healthy Lifestyle.  Know what a healthy weight is for you (roughly BMI <25) and aim to maintain this   Aim for 7+ servings of fruits and vegetables daily   65-80+ fluid ounces of water or unsweet tea for healthy kidneys   Limit to max 1 drink of alcohol per day; avoid smoking/tobacco   Limit animal fats in diet for cholesterol and heart health - choose grass fed whenever available   Avoid highly processed foods, and foods high in saturated/trans fats   Aim for low stress - take time to unwind and care for your mental health   Aim for 150 min of moderate intensity exercise weekly for heart health, and weights twice weekly for bone health   Aim for 7-9 hours of sleep daily   When it comes to diets, agreement about the perfect plan isnt easy to find, even among the experts. Experts at the [employer] developed an idea known as the Healthy Eating Plate. Just imagine a plate divided into logical, healthy portions.   The emphasis is on diet quality:   Load up on vegetables and fruits - one-half of your plate: Aim for color and variety, and remember that potatoes dont count.   Go for whole grains - one-quarter of your plate: Whole wheat, barley, wheat berries, quinoa, oats, brown rice, and foods made with them. If you want pasta, go with whole wheat pasta.   Protein power - one-quarter of your plate: Fish, chicken, beans, and nuts are all healthy, versatile protein sources. Limit red meat.   The diet, however, does go beyond the plate, offering a few other suggestions.   Use healthy plant oils, such as olive, canola, soy, corn, sunflower and peanut. Check the labels, and avoid partially hydrogenated oil, which have unhealthy trans fats.   If youre thirsty, drink water. Coffee  and tea are good in moderation, but skip sugary drinks and limit milk and dairy products to one or two daily servings.   The type of carbohydrate in the diet is more important than the amount. Some sources of carbohydrates, such as vegetables, fruits, whole grains, and beans-are healthier than others.   Finally, stay active  Signed, Thomasene Ripple, DO  06/16/2019 7:59 PM    [facility]

## 2019-06-16 NOTE — Patient Instructions (Addendum)
Medication Instructions:  Your physician recommends that you continue on your current medications as directed. Please refer to the Current Medication list given to you today.  *If you need a refill on your cardiac medications before your next appointment, please call your pharmacy*  Lab Work: Your physician recommends that you return for lab work in:   3-7 days prior to CT:BMP May have lipid drawn at same time.  If you have labs (blood work) drawn today and your tests are completely normal, you will receive your results only by: Marland Kitchen MyChart Message (if you have MyChart) OR . A paper copy in the mail If you have any lab test that is abnormal or we need to change your treatment, we will call you to review the results.  Testing/Procedures: Your physician has requested that you have cardiac CT. Cardiac computed tomography (CT) is a painless test that uses an x-ray machine to take clear, detailed pictures of your heart. For further information please visit [email or web address]. Please follow instruction sheet as given.  Your cardiac CT will be scheduled at one of the below locations:   [facility] [address] [place], [place] [address] [phone]  If scheduled at [facility], please arrive at the [facility] main entrance of [facility] 30-45 minutes prior to test start time. Proceed to the [facility] Radiology Department (first floor) to check-in and test prep.  Please follow these instructions carefully (unless otherwise directed):  Hold all erectile dysfunction medications at least 3 days (72 hrs) prior to test.  On the Night Before the Test: . Be sure to Drink plenty of water. . Do not consume any caffeinated/decaffeinated beverages or chocolate 12 hours prior to your test. . Do not take any antihistamines 12 hours prior to your test.  On the Day of the Test: . Drink plenty of water. Do not drink any water within one hour of the test. . Do not  eat any food 4 hours prior to the test. . You may take your regular medications prior to the test.  . Take metoprolol (Toprol XL) two hours prior to test. . Hold metformin and insulin day of test.      After the Test: . Drink plenty of water. . After receiving IV contrast, you may experience a mild flushed feeling. This is normal. . On occasion, you may experience a mild rash up to 24 hours after the test. This is not dangerous. If this occurs, you can take Benadryl 25 mg and increase your fluid intake. . If you experience trouble breathing, this can be serious. If it is severe call 911 IMMEDIATELY. If it is mild, please call our office. . If you take any of these medications: Glipizide/Metformin, Avandament, Glucavance, please do not take 48 hours after completing test unless otherwise instructed.   Once we have confirmed authorization from your insurance company, we will call you to set up a date and time for your test.   For non-scheduling related questions, please contact the cardiac imaging nurse navigator should you have any questions/concerns: Marchia Bond, RN Navigator Cardiac Imaging [facility] [phone] Office    Follow-Up: At [facility], you and your health needs are our priority.  As part of our continuing mission to provide you with exceptional heart care, we have created designated Provider Care Teams.  These Care Teams include your primary Cardiologist (physician) and Advanced Practice Providers (APPs -  Physician Assistants and Nurse Practitioners) who  all work together to provide you with the care you need, when you need it.  Your next appointment:   2 months  The format for your next appointment:   In Person  Provider:   Thomasene Ripple, DO  Other Instructions

## 2019-06-20 ENCOUNTER — Telehealth: Payer: Self-pay | Admitting: Cardiology

## 2019-06-20 NOTE — Telephone Encounter (Signed)
Received a page from [employer] regarding critical ECG  Per the report - patient with auto detected recording with 11 beat run of NSVT at 159 beats per minute. The remaining rhythm was sinus rhythm with PACs. Patient now in sinus rhythm at 80 bpm. Patient was reached via phone and noted being asymptomatic, was lying in bed.  Bryna Colander, MD

## 2019-06-23 ENCOUNTER — Encounter: Payer: Self-pay | Admitting: Student

## 2019-06-23 ENCOUNTER — Telehealth: Payer: Self-pay | Admitting: Internal Medicine

## 2019-06-23 ENCOUNTER — Telehealth: Payer: Self-pay | Admitting: Student

## 2019-06-23 MED ORDER — METOPROLOL SUCCINATE ER 50 MG PO TB24: 50.0000 mg | ORAL_TABLET | Freq: Every day | ORAL | 3 refills | Status: DC

## 2019-06-23 NOTE — Telephone Encounter (Signed)
Dr.Taylor reviewed patients critical event monitor and said patient can wait until 07/14/2019 apt to be seen and if he has any symptoms of syncope, to go to the ED. I e-scribed increased dose of Toprol XL 50 mg to [facility], pt aware.

## 2019-06-23 NOTE — Addendum Note (Signed)
Addended by: Barbarann Ehlers A on: 06/23/2019 01:22 PM   Modules accepted: Orders

## 2019-06-23 NOTE — Telephone Encounter (Signed)
     Made aware by nursing staff of patient's monitor results which showed 11 beats NSVT. They contacted the patient yesterday and he is out of town at this time. Had SVT with abberancy during recent ED evaluation.   Appears he was evaluated by Dr. Harriet Masson in the interim as well and is scheduled for a Coronary CT for ischemic evaluation.   Would recommend increasing Toprol-XL from 25mg  daily to 50mg  daily. Would see if closer follow-up with EP can be arranged. Will forward to Dr. Harriet Masson as an Juluis Rainier.   Signed, Erma Heritage, PA-C 06/23/2019, 7:58 AM Pager: [phone]

## 2019-06-23 NOTE — Telephone Encounter (Signed)
Cardiology Moonlighter Note  Returned page from [employer]. Patient noted to have 6 seconds of NSVT at rate of 160 bpm. Now back in sinus rhythm. Patient called but no answer. Will cc ordering provider.   Marcie Mowers, MD Cardiology Fellow, PGY-7

## 2019-06-23 NOTE — Telephone Encounter (Signed)
[id number] 

## 2019-06-25 ENCOUNTER — Emergency Department (HOSPITAL_COMMUNITY): Payer: Medicare HMO

## 2019-06-25 ENCOUNTER — Encounter (HOSPITAL_COMMUNITY): Payer: Self-pay | Admitting: *Deleted

## 2019-06-25 ENCOUNTER — Other Ambulatory Visit: Payer: Self-pay

## 2019-06-25 ENCOUNTER — Inpatient Hospital Stay (HOSPITAL_COMMUNITY)
Admission: EM | Admit: 2019-06-25 | Discharge: 2019-06-28 | DRG: 281 | Disposition: A | Discharge status: Home / Self Care | Payer: Medicare HMO | Attending: Cardiology | Admitting: Cardiology

## 2019-06-25 DIAGNOSIS — I10 Essential (primary) hypertension: Secondary | ICD-10-CM | POA: Diagnosis present

## 2019-06-25 DIAGNOSIS — E039 Hypothyroidism, unspecified: Secondary | ICD-10-CM | POA: Diagnosis present

## 2019-06-25 DIAGNOSIS — E785 Hyperlipidemia, unspecified: Secondary | ICD-10-CM | POA: Diagnosis present

## 2019-06-25 DIAGNOSIS — I471 Supraventricular tachycardia, unspecified: Secondary | ICD-10-CM

## 2019-06-25 DIAGNOSIS — I214 Non-ST elevation (NSTEMI) myocardial infarction: Secondary | ICD-10-CM | POA: Diagnosis not present

## 2019-06-25 DIAGNOSIS — E118 Type 2 diabetes mellitus with unspecified complications: Secondary | ICD-10-CM

## 2019-06-25 DIAGNOSIS — Z794 Long term (current) use of insulin: Secondary | ICD-10-CM

## 2019-06-25 DIAGNOSIS — Z20828 Contact with and (suspected) exposure to other viral communicable diseases: Secondary | ICD-10-CM | POA: Diagnosis not present

## 2019-06-25 DIAGNOSIS — E782 Mixed hyperlipidemia: Secondary | ICD-10-CM | POA: Diagnosis not present

## 2019-06-25 DIAGNOSIS — K219 Gastro-esophageal reflux disease without esophagitis: Secondary | ICD-10-CM | POA: Diagnosis present

## 2019-06-25 DIAGNOSIS — R0789 Other chest pain: Secondary | ICD-10-CM | POA: Diagnosis not present

## 2019-06-25 DIAGNOSIS — Z825 Family history of asthma and other chronic lower respiratory diseases: Secondary | ICD-10-CM

## 2019-06-25 DIAGNOSIS — G4733 Obstructive sleep apnea (adult) (pediatric): Secondary | ICD-10-CM | POA: Diagnosis not present

## 2019-06-25 DIAGNOSIS — Z8249 Family history of ischemic heart disease and other diseases of the circulatory system: Secondary | ICD-10-CM

## 2019-06-25 DIAGNOSIS — F419 Anxiety disorder, unspecified: Secondary | ICD-10-CM | POA: Diagnosis present

## 2019-06-25 DIAGNOSIS — E1165 Type 2 diabetes mellitus with hyperglycemia: Secondary | ICD-10-CM | POA: Diagnosis present

## 2019-06-25 DIAGNOSIS — Z96651 Presence of right artificial knee joint: Secondary | ICD-10-CM | POA: Diagnosis present

## 2019-06-25 DIAGNOSIS — Z87442 Personal history of urinary calculi: Secondary | ICD-10-CM

## 2019-06-25 DIAGNOSIS — Z9049 Acquired absence of other specified parts of digestive tract: Secondary | ICD-10-CM

## 2019-06-25 DIAGNOSIS — Z8619 Personal history of other infectious and parasitic diseases: Secondary | ICD-10-CM

## 2019-06-25 DIAGNOSIS — R000 Tachycardia, unspecified: Secondary | ICD-10-CM | POA: Diagnosis present

## 2019-06-25 DIAGNOSIS — Z833 Family history of diabetes mellitus: Secondary | ICD-10-CM

## 2019-06-25 DIAGNOSIS — I2510 Atherosclerotic heart disease of native coronary artery without angina pectoris: Secondary | ICD-10-CM | POA: Diagnosis present

## 2019-06-25 DIAGNOSIS — J9811 Atelectasis: Secondary | ICD-10-CM | POA: Diagnosis not present

## 2019-06-25 DIAGNOSIS — Z823 Family history of stroke: Secondary | ICD-10-CM

## 2019-06-25 DIAGNOSIS — Z79899 Other long term (current) drug therapy: Secondary | ICD-10-CM

## 2019-06-25 HISTORY — DX: Atherosclerotic heart disease of native coronary artery without angina pectoris: I25.10

## 2019-06-25 LAB — TSH: TSH: 1.714 u[IU]/mL (ref 0.350–4.500)

## 2019-06-25 LAB — BASIC METABOLIC PANEL
Anion gap: 8 (ref 5–15)
BUN: 12 mg/dL (ref 6–20)
CO2: 25 mmol/L (ref 22–32)
Calcium: 8.4 mg/dL — ABNORMAL LOW (ref 8.9–10.3)
Chloride: 105 mmol/L (ref 98–111)
Creatinine, Ser: 1.03 mg/dL (ref 0.61–1.24)
GFR calc Af Amer: >60 mL/min (ref >60)
GFR calc non Af Amer: >60 mL/min (ref >60)
Glucose, Bld: 236 mg/dL — ABNORMAL HIGH (ref 70–99)
Potassium: 3.8 mmol/L (ref 3.5–5.1)
Sodium: 138 mmol/L (ref 135–145)

## 2019-06-25 LAB — CBC
HCT: 43.2 % (ref 39.0–52.0)
Hemoglobin: 15.1 g/dL (ref 13.0–17.0)
MCH: 31.5 pg (ref 26.0–34.0)
MCHC: 35 g/dL (ref 30.0–36.0)
MCV: 90 fL (ref 80.0–100.0)
Platelets: 263 10*3/uL (ref 150–400)
RBC: 4.8 MIL/uL (ref 4.22–5.81)
RDW: 12.2 % (ref 11.5–15.5)
WBC: 10.3 10*3/uL (ref 4.0–10.5)
nRBC: 0 % (ref 0.0–0.2)

## 2019-06-25 LAB — TROPONIN I (HIGH SENSITIVITY)
Troponin I (High Sensitivity): 14 ng/L (ref <18)
Troponin I (High Sensitivity): 26 ng/L — ABNORMAL HIGH (ref <18)

## 2019-06-25 LAB — D-DIMER, QUANTITATIVE: D-Dimer, Quant: 0.47 ug/mL-FEU (ref 0.00–0.50)

## 2019-06-25 MED ORDER — SODIUM CHLORIDE 0.9 % IV SOLN: 250.0000 mL | INTRAVENOUS | Status: DC | PRN

## 2019-06-25 MED ORDER — ACETAMINOPHEN 325 MG PO TABS
650.0000 mg | ORAL_TABLET | Freq: Four times a day (QID) | ORAL | Status: DC | PRN
  Administered 2019-06-27 – 2019-06-28 (×3): 650 mg via ORAL
  Filled 2019-06-25 (×3): qty 2

## 2019-06-25 MED ORDER — SODIUM CHLORIDE 0.9% FLUSH: 3.0000 mL | INTRAVENOUS | Status: DC | PRN

## 2019-06-25 MED ORDER — ENOXAPARIN SODIUM 40 MG/0.4ML ~~LOC~~ SOLN
40.0000 mg | SUBCUTANEOUS | Status: DC
  Administered 2019-06-25: 40 mg via SUBCUTANEOUS
  Filled 2019-06-25: qty 0.4

## 2019-06-25 MED ORDER — ACETAMINOPHEN 650 MG RE SUPP: 650.0000 mg | Freq: Four times a day (QID) | RECTAL | Status: DC | PRN

## 2019-06-25 MED ORDER — SODIUM CHLORIDE 0.9% FLUSH
3.0000 mL | Freq: Two times a day (BID) | INTRAVENOUS | Status: DC
  Administered 2019-06-25 – 2019-06-27 (×3): 3 mL via INTRAVENOUS

## 2019-06-25 MED ORDER — SODIUM CHLORIDE 0.9% FLUSH
3.0000 mL | Freq: Once | INTRAVENOUS | Status: AC
  Administered 2019-06-25: 3 mL via INTRAVENOUS

## 2019-06-25 NOTE — ED Provider Notes (Signed)
[facility] EMERGENCY DEPARTMENT Provider Note   CSN: [id number] Arrival date & time: 06/25/19  1503     History   Chief Complaint Chief Complaint  Patient presents with  . Chest Pain    HPI Sean Horn is a 58 y.o. male.     HPI   Sean Horn is a 58 y.o. male with past medical history of anxiety, GERD, hypertension, diabetes who presents to the Emergency Department complaining of central chest pressure, mild shortness of breath.  Symptoms began this morning around 11 AM.  He describes intermittent episodes of rapid heartbeat today associated with near syncope.  He was seen here on 06/12/2019 for tachycardia.  He was discharged with a Holter monitor and since had an echocardiogram.  He is currently awaiting an appointment for a stress test.  Upon arrival to the ER he had one episode of nausea and vomiting which provided relief of his chest pressure.   He denies recent illness, fever, chills, lower extremity edema and abdominal pain. He is currently wearing a critical event monitor monitor.  He was started on metoprolol 25 mg which was increased this week to 50 mg daily.    Cardiologist:  Dr. Servando Salina  Past Medical History:  Diagnosis Date  . Anxiety   . Chronic headaches   . Chronic neck pain   . GERD (gastroesophageal reflux disease)    occasional - OTC as needed  . History of chicken pox   . History of kidney stones   . History of shingles   . Hypertension    under control with med., had been on med. since age 80  . Insomnia 09/13/2016  . Insulin dependent diabetes mellitus    Follows with endocrinology, Dr Chestine Spore reports his last hgba1c was 7.3   . Non-insulin dependent type 2 diabetes mellitus (HCC)   . Obesity 01/30/2017  . Occipital neuralgia   . Osteoarthritis    right knee  . PONV (postoperative nausea and vomiting)   . PONV (postoperative nausea and vomiting)   . Preventative health care 08/14/2013  . Tachycardia   . Urinary frequency 12/11/2016     Patient Active Problem List   Diagnosis Date Noted  . Hypertriglyceridemia 06/16/2019  . Palpitations 06/12/2019  . Morning headache 12/02/2018  . 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 Retrognathia 12/02/2018  . TIA (transient ischemic attack) 11/03/2018  . Cervical stenosis of spine 01/16/2018  . Hyperlipidemia 07/19/2017  . Obesity 01/30/2017  . Urinary frequency 12/11/2016  . Insomnia 09/13/2016  . History of chicken pox   . History of shingles   . Preventative health care 08/14/2013  . Anxiety   . History of kidney stones   . GERD (gastroesophageal reflux disease)   . Type 2 diabetes mellitus with complication, with long-term current use of insulin (HCC)   . Hypertension   . Osteoarthritis   . 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 ANTERIOR CERVICAL DECOMP/DISCECTOMY FUSION N/A 01/16/2018   Procedure: ANTERIOR CERVICAL DECOMPRESSION/DISCECTOMY FUSION CERVICAL FOUR-FIVE, CERVICAL FIVE-SIX;  Surgeon: Lisbeth Renshaw, MD;  Location: [facility] OR;  Service: Neurosurgery;  Laterality: N/A;  ANTERIOR CERVICAL DECOMPRESSION/DISCECTOMY FUSION CERVICAL FOUR-FIVE, CERVICAL FIVE-SIX  . CHOLECYSTECTOMY  1980's  . CYSTOSCOPY/RETROGRADE/URETEROSCOPY/STONE EXTRACTION WITH BASKET Right 08/27/2003   and double J stent placement  . INGUINAL HERNIA REPAIR Left 1990's  . KNEE ARTHROSCOPY Left 1980's  . KNEE ARTHROSCOPY Right 2013  .  KNEE ARTHROSCOPY Right 06/30/2013   Procedure: RIGHT KNEE ARTHROSCOPY AND SYNOVECTOMY;  Surgeon: Velna OchsPeter G Dalldorf, MD;  Location: [facility];  Service: Orthopedics;  Laterality: Right;  . LITHOTRIPSY     multiple, b/l  . SHOULDER ADHESION RELEASE Left ` 1998  . SHOULDER ARTHROSCOPY W/ ROTATOR CUFF REPAIR Left 10/20/2004   x 4, had to have part of clavile removed. torn rotator cuff multiple times, had to place screws   . SHOULDER ARTHROSCOPY W/ SUPERIOR LABRAL ANTERIOR POSTERIOR  LESION REPAIR Left 12/22/2004  . TOTAL KNEE ARTHROPLASTY Right 12/23/2012   x3, 2 arthroscopies and a TKR,  TOTAL KNEE ARTHROPLASTY;  Surgeon: Velna OchsPeter G Dalldorf, MD;  Location: [facility] OR;  Service: Orthopedics;  Laterality: Right;  DEPUY, RNFA  . WRIST SURGERY Right    x 3, torn ligaments, pins placed then infected, then fused with plate        Home Medications    Prior to Admission medications   Medication Sig Start Date End Date Taking? Authorizing Provider  atorvastatin (LIPITOR) 10 MG tablet Take 0.5 tablets (5 mg total) by mouth every other day. 05/05/19   Bradd CanaryBlyth, Stacey A, MD  Coenzyme Q10 (COQ-10) 75 MG CAPS Take 75 mg by mouth daily. 11/06/18   Bradd CanaryBlyth, Stacey A, MD  Continuous Blood Gluc Sensor (FREESTYLE LIBRE 14 DAY SENSOR) MISC 1 each by Does not apply route every 14 (fourteen) days. Change every 2 weeks 05/19/19   Carlus PavlovGherghe, Cristina, MD  Dulaglutide (TRULICITY) 1.5 MG/0.5ML SOPN Inject 1.5 mg into the skin once a week. NEED APPOINTMENT FOR FURTHER REFILLS 05/04/19   Carlus PavlovGherghe, Cristina, MD  escitalopram (LEXAPRO) 20 MG tablet Take 1 tablet by mouth once daily 05/04/19   Bradd CanaryBlyth, Stacey A, MD  EUTHYROX 25 MCG tablet TAKE 1 TABLET BY MOUTH ONCE DAILY BEFORE BREAKFAST 05/04/19   Bradd CanaryBlyth, Stacey A, MD  insulin aspart (NOVOLOG FLEXPEN) 100 UNIT/ML FlexPen Inject 25-35 units 3 times daily with meals Patient taking differently: Inject 10 Units into the skin 3 (three) times daily with meals.  07/01/18   Carlus PavlovGherghe, Cristina, MD  insulin detemir (LEVEMIR) 100 UNIT/ML injection Inject 0.75 mLs (75 Units total) into the skin at bedtime. Patient taking differently: Inject 60 Units into the skin at bedtime.  07/01/18   Carlus PavlovGherghe, Cristina, MD  lansoprazole (PREVACID) 30 MG capsule Take 30 mg by mouth every morning.     [provider]  loratadine (CLARITIN) 10 MG tablet Take 10 mg by mouth every morning.     [provider]  metFORMIN (GLUCOPHAGE-XR) 500 MG 24 hr tablet Take 1 tablet (500 mg total)  by mouth 2 (two) times daily with a meal. 07/01/18   Carlus PavlovGherghe, Cristina, MD  metoprolol succinate (TOPROL-XL) 50 MG 24 hr tablet Take 1 tablet (50 mg total) by mouth daily. Take with or immediately following a meal. 06/23/19 09/21/19  Strader, Lennart PallBrittany M, PA-C  naproxen (NAPROSYN) 250 MG tablet Take 250 mg by mouth 2 (two) times daily with a meal.    [provider]  topiramate (TOPAMAX) 50 MG tablet Take 1 tablet (50 mg total) by mouth at bedtime. 11/18/18   Anson FretAhern, Antonia B, MD    Family History Family History  Problem Relation Age of Onset  . COPD Mother        smoker  . Hypertension Mother   . Heart disease Mother        CHF  . Diabetes Father   . Heart disease Father  CABG in 50's  . Stroke Father   . Hypertension Father   . Peripheral vascular disease Father   . Hypertension Son        tachycardia  . Heart disease Maternal Grandfather   . Arthritis Paternal Grandmother   . Colon cancer Neg Hx   . Rectal cancer Neg Hx   . Stomach cancer Neg Hx     Social History Social History   Tobacco Use  . Smoking status: Never Smoker  . Smokeless tobacco: Current User    Types: Snuff  Substance Use Topics  . Alcohol use: No    Frequency: Never  . Drug use: No     Allergies   Rosuvastatin and Toradol [ketorolac tromethamine]   Review of Systems Review of Systems  Constitutional: Negative for chills and fever.  Respiratory: Positive for chest tightness. Negative for cough and shortness of breath.   Cardiovascular: Positive for chest pain.  Gastrointestinal: Positive for nausea and vomiting. Negative for abdominal pain.  Genitourinary: Negative for dysuria and flank pain.  Musculoskeletal: Negative for arthralgias, myalgias and neck pain.  Skin: Negative for rash.  Neurological: Negative for dizziness, weakness and numbness.     Physical Exam Updated Vital Signs BP 118/83   Pulse 87   Temp 98.1 F (36.7 C) (Oral)   Resp 17   Ht 6' (1.829 m)   Wt  97.5 kg   SpO2 100%   BMI 29.16 kg/m   Physical Exam Vitals signs and nursing note reviewed.  Constitutional:      Appearance: Normal appearance. He is not ill-appearing or toxic-appearing.  HENT:     Head: Atraumatic.     Mouth/Throat:     Mouth: Mucous membranes are moist.  Neck:     Musculoskeletal: Normal range of motion.  Cardiovascular:     Rate and Rhythm: Normal rate and regular rhythm.     Pulses: Normal pulses.  Pulmonary:     Effort: Pulmonary effort is normal.     Breath sounds: Normal breath sounds.  Abdominal:     General: There is no distension.     Palpations: Abdomen is soft.     Tenderness: There is no abdominal tenderness. There is no guarding.  Musculoskeletal: Normal range of motion.     Right lower leg: No edema.     Left lower leg: No edema.  Skin:    General: Skin is warm.     Capillary Refill: Capillary refill takes less than 2 seconds.     Findings: No rash.  Neurological:     General: No focal deficit present.     Mental Status: He is alert.      ED Treatments / Results  Labs (all labs ordered are listed, but only abnormal results are displayed) Labs Reviewed  BASIC METABOLIC PANEL - Abnormal; Notable for the following components:      Result Value   Glucose, Bld 236 (*)    Calcium 8.4 (*)    All other components within normal limits  TROPONIN I (HIGH SENSITIVITY) - Abnormal; Notable for the following components:   Troponin I (High Sensitivity) 26 (*)    All other components within normal limits  SARS CORONAVIRUS 2 (TAT 6-24 HRS)  CBC  TROPONIN I (HIGH SENSITIVITY)    EKG EKG Interpretation  Date/Time:  Thursday June 25 2019 15:23:35 EST Ventricular Rate:  103 PR Interval:  184 QRS Duration: 98 QT Interval:  348 QTC Calculation: 455 R Axis:   48 Text  Interpretation: Sinus tachycardia Otherwise normal ECG Confirmed by Donnetta Hutching [phone]) on 06/25/2019 4:42:41 PM   Radiology Dg Chest Portable 1 View  Result Date:  06/25/2019 CLINICAL DATA:  Radiograph 06/08/2019 EXAM: PORTABLE CHEST 1 VIEW COMPARISON:  None. FINDINGS: Low volumes and atelectasis. No consolidation, features of edema, pneumothorax, or effusion. The cardiomediastinal contours are unremarkable. Implantable loop recorder noted over the left chest wall. Sclerotic changes in the left humeral head may reflect sequela of prior bone infarct. Cervical fusion hardware incompletely assessed on this exam. Likely postsurgical changes of the distal left clavicle. No acute osseous or soft tissue abnormality. IMPRESSION: 1. Low lung volumes with atelectasis. No acute cardiopulmonary abnormality. 2. Implantable loop recorder over the left chest wall. Electronically Signed   By: Kreg Shropshire M.D.   On: 06/25/2019 17:45    Procedures Procedures (including critical care time)  Medications Ordered in ED Medications  sodium chloride flush (NS) 0.9 % injection 3 mL (3 mLs Intravenous Given 06/25/19 1711)     Initial Impression / Assessment and Plan / ED Course  I have reviewed the triage vital signs and the nursing notes.  Pertinent labs & imaging results that were available during my care of the patient were reviewed by me and considered in my medical decision making (see chart for details).      On review of patient medical record, echocardiogram performed on 06/12/2019 showed left ventricular EF of 55 to 60%.  He is wearing cardiac event monitoring and has been noted to have brief episodes of NSVT on previous evaluations. No syncope today  On recheck, patient is resting comfortably, but he continues to have brief episodes of feeling tachycardic and near syncopal.  Delta troponin is elevated.  Chest x-ray is reassuring.  I feel the patient will need admission for further cardiac work-up.   Consulted hospitalist, Dr. Selena Batten who agrees to admit.    Final Clinical Impressions(s) / ED Diagnoses   Final diagnoses:  Tachycardia    ED Discharge Orders    None        Rosey Bath 06/25/19 2322    Donnetta Hutching, MD 06/28/19 2108

## 2019-06-25 NOTE — H&P (Signed)
TRH H&P    Patient Demographics:    Sean Horn, is a 58 y.o. male  MRN: [id number]  DOB - 01/04/1961  Admit Date - 06/25/2019  Referring MD/NP/PA: Trisha Mangleriplett  Outpatient Primary MD for the patient is Copland, Gwenlyn FoundJessica C, MD Lewayne BuntingGregg Taylor - cardiology  Patient coming from: home  Chief complaint- palpitations   HPI:    Sean Horn  is a 58 y.o. male, w hypertension, hyperlipidemia, dm2, gerd, ? SVT, apparently presents with c/o palpitations, and chest pressure starting today. Pt denies fever, chills, cough, abd pain, n/v, diarrhea, brbpr.  Pt notes has had his Toprol increased from 25-50mg  po qday  In ED,  T 98.1, P 79 R 20, Bp 135/98  Pox 98% on RA  CXR IMPRESSION: 1. Low lung volumes with atelectasis. No acute cardiopulmonary abnormality. 2. Implantable loop recorder over the left chest wall.  Wbc 10.3, hgb 15.1, Plt 263 Na 138, K 3.8, Bun 12, Creatinine 1.03 Trop 14 -> 26  EKG ST at 105, nl axis, no st-t changes c/w ischemia (no significant change from 06/19/2019)  Pt will be admitted for palpitations and elevated troponin    Review of systems:    In addition to the HPI above,  No Fever-chills, No Headache, No changes with Vision or hearing, No problems swallowing food or Liquids, No Chest pain, Cough or Shortness of Breath, No Abdominal pain, No Nausea or Vomiting, bowel movements are regular, No Blood in stool or Urine, No dysuria, No new skin rashes or bruises, No new joints pains-aches,  No new weakness, tingling, numbness in any extremity, No recent weight gain or loss, No polyuria, polydypsia or polyphagia, No significant Mental Stressors.  All other systems reviewed and are negative.    Past History of the following :    Past Medical History:  Diagnosis Date  . Anxiety   . Chronic headaches   . Chronic neck pain   . GERD (gastroesophageal reflux disease)    occasional - OTC as needed  . History of chicken pox   . History of kidney stones   . History of shingles   . Hypertension    under control with med., had been on med. since age 58  . Insomnia 09/13/2016  . Insulin dependent diabetes mellitus    Follows with endocrinology, Dr Chestine Sporelark reports his last hgba1c was 7.3   . Non-insulin dependent type 2 diabetes mellitus (HCC)   . Obesity 01/30/2017  . Occipital neuralgia   . Osteoarthritis    right knee  . PONV (postoperative nausea and vomiting)   . PONV (postoperative nausea and vomiting)   . Preventative health care 08/14/2013  . Tachycardia   . Urinary frequency 12/11/2016      Past Surgical History:  Procedure Laterality Date  . ANTERIOR CERVICAL DECOMP/DISCECTOMY FUSION N/A 01/16/2018   Procedure: ANTERIOR CERVICAL DECOMPRESSION/DISCECTOMY FUSION CERVICAL FOUR-FIVE, CERVICAL FIVE-SIX;  Surgeon: Lisbeth RenshawNundkumar, Neelesh, MD;  Location: [facility] OR;  Service: Neurosurgery;  Laterality: N/A;  ANTERIOR CERVICAL DECOMPRESSION/DISCECTOMY FUSION CERVICAL FOUR-FIVE, CERVICAL FIVE-SIX  . CHOLECYSTECTOMY  1980's  . CYSTOSCOPY/RETROGRADE/URETEROSCOPY/STONE EXTRACTION WITH BASKET Right 08/27/2003   and double J stent placement  . INGUINAL HERNIA REPAIR Left 1990's  . KNEE ARTHROSCOPY Left 1980's  . KNEE ARTHROSCOPY Right 2013  . KNEE ARTHROSCOPY Right 06/30/2013   Procedure: RIGHT KNEE ARTHROSCOPY AND SYNOVECTOMY;  Surgeon: Hessie Dibble, MD;  Location: [facility];  Service: Orthopedics;  Laterality: Right;  . LITHOTRIPSY     multiple, b/l  . SHOULDER ADHESION RELEASE Left ` 1998  . SHOULDER ARTHROSCOPY W/ ROTATOR CUFF REPAIR Left 10/20/2004   x 4, had to have part of clavile removed. torn rotator cuff multiple times, had to place screws   . SHOULDER ARTHROSCOPY W/ SUPERIOR LABRAL ANTERIOR POSTERIOR LESION REPAIR Left 12/22/2004  . TOTAL KNEE ARTHROPLASTY Right 12/23/2012   x3, 2 arthroscopies and a TKR,  TOTAL KNEE ARTHROPLASTY;  Surgeon: Hessie Dibble, MD;  Location: [facility];  Service: Orthopedics;  Laterality: Right;  DEPUY, RNFA  . WRIST SURGERY Right    x 3, torn ligaments, pins placed then infected, then fused with plate      Social History:      Social History   Tobacco Use  . Smoking status: Never Smoker  . Smokeless tobacco: Current User    Types: Snuff  Substance Use Topics  . Alcohol use: No    Frequency: Never       Family History :     Family History  Problem Relation Age of Onset  . COPD Mother        smoker  . Hypertension Mother   . Heart disease Mother        CHF  . Diabetes Father   . Heart disease Father        CABG in 39's  . Stroke Father   . Hypertension Father   . Peripheral vascular disease Father   . Hypertension Son        tachycardia  . Heart disease Maternal Grandfather   . Arthritis Paternal Grandmother   . Colon cancer Neg Hx   . Rectal cancer Neg Hx   . Stomach cancer Neg Hx        Home Medications:   Prior to Admission medications   Medication Sig Start Date End Date Taking? Authorizing Provider  atorvastatin (LIPITOR) 10 MG tablet Take 0.5 tablets (5 mg total) by mouth every other day. 05/05/19  Yes Mosie Lukes, MD  Dulaglutide (TRULICITY) 1.5 ZO/1.0RU SOPN Inject 1.5 mg into the skin once a week. NEED APPOINTMENT FOR FURTHER REFILLS Patient taking differently: Inject 1.5 mg into the skin once a week.  05/04/19  Yes Philemon Kingdom, MD  escitalopram (LEXAPRO) 20 MG tablet Take 1 tablet by mouth once daily Patient taking differently: Take 20 mg by mouth daily.  05/04/19  Yes Mosie Lukes, MD  EUTHYROX 25 MCG tablet TAKE 1 TABLET BY MOUTH ONCE DAILY BEFORE BREAKFAST Patient taking differently: Take 25 mcg by mouth daily before breakfast.  05/04/19  Yes Mosie Lukes, MD  insulin detemir (LEVEMIR) 100 UNIT/ML injection Inject 0.75 mLs (75 Units total) into the skin at bedtime. Patient taking differently: Inject 60 Units into the skin at bedtime.  07/01/18  Yes  Philemon Kingdom, MD  metFORMIN (GLUCOPHAGE-XR) 500 MG 24 hr tablet Take 1 tablet (500 mg total) by mouth 2 (two) times daily with a meal. 07/01/18  Yes Philemon Kingdom, MD  metoprolol succinate (TOPROL-XL) 50 MG 24 hr tablet Take 1 tablet (50  mg total) by mouth daily. Take with or immediately following a meal. 06/23/19 09/21/19 Yes Strader, [place] M, PA-C  sildenafil (REVATIO) 20 MG tablet Take 20 mg by mouth daily as needed.  06/17/19  Yes [provider]  topiramate (TOPAMAX) 50 MG tablet Take 1 tablet (50 mg total) by mouth at bedtime. 11/18/18  Yes Anson Fret, MD  Coenzyme Q10 (COQ-10) 75 MG CAPS Take 75 mg by mouth daily. 11/06/18   Bradd Canary, MD  Continuous Blood Gluc Sensor (FREESTYLE LIBRE 14 DAY SENSOR) MISC 1 each by Does not apply route every 14 (fourteen) days. Change every 2 weeks Patient not taking: Reported on 06/25/2019 05/19/19   Carlus Pavlov, MD  insulin aspart (NOVOLOG FLEXPEN) 100 UNIT/ML FlexPen Inject 25-35 units 3 times daily with meals Patient taking differently: Inject 10 Units into the skin 3 (three) times daily with meals.  07/01/18   Carlus Pavlov, MD  lansoprazole (PREVACID) 30 MG capsule Take 30 mg by mouth every morning.     [provider]  loratadine (CLARITIN) 10 MG tablet Take 10 mg by mouth every morning.     [provider]  naproxen (NAPROSYN) 250 MG tablet Take 250 mg by mouth 2 (two) times daily with a meal.    [provider]     Allergies:     Allergies  Allergen Reactions  . Rosuvastatin Other (See Comments)  . Toradol [Ketorolac Tromethamine] Itching     Physical Exam:   Vitals  Blood pressure (!) 137/94, pulse 84, temperature 98.1 F (36.7 C), temperature source Oral, resp. rate 18, height 6' (1.829 m), weight 97.5 kg, SpO2 100 %.  1.  General: axoxo3  2. Psychiatric: euthymic  3. Neurologic: nonfocal  4. HEENMT:  Anicteric, pupils 1.28mm symmetric, direct, consensual, near  intact Neck: no jvd  5. Respiratory : CTAB  6. Cardiovascular : rrr s1, s2, no m/g/r  7. Gastrointestinal:  Abd: soft, nt, nd, +bs  8. Skin:  Ext: no c/c/e, no rash  9.Musculoskeletal:  Good ROM    Data Review:    CBC Recent Labs  Lab 06/25/19 1522  WBC 10.3  HGB 15.1  HCT 43.2  PLT 263  MCV 90.0  MCH 31.5  MCHC 35.0  RDW 12.2   ------------------------------------------------------------------------------------------------------------------  Results for orders placed or performed during the hospital encounter of 06/25/19 (from the past 48 hour(s))  CBC     Status: None   Collection Time: 06/25/19  3:22 PM  Result Value Ref Range   WBC 10.3 4.0 - 10.5 K/uL   RBC 4.80 4.22 - 5.81 MIL/uL   Hemoglobin 15.1 13.0 - 17.0 g/dL   HCT 09.8 11.9 - 14.7 %   MCV 90.0 80.0 - 100.0 fL   MCH 31.5 26.0 - 34.0 pg   MCHC 35.0 30.0 - 36.0 g/dL   RDW 82.9 56.2 - 13.0 %   Platelets 263 150 - 400 K/uL   nRBC 0.0 0.0 - 0.2 %    Comment: Performed at [facility], [address], [place], [place] [address]  Basic metabolic panel     Status: Abnormal   Collection Time: 06/25/19  5:33 PM  Result Value Ref Range   Sodium 138 135 - 145 mmol/L   Potassium 3.8 3.5 - 5.1 mmol/L   Chloride 105 98 - 111 mmol/L   CO2 25 22 - 32 mmol/L   Glucose, Bld 236 (H) 70 - 99 mg/dL   BUN 12 6 - 20 mg/dL  Creatinine, Ser 1.03 0.61 - 1.24 mg/dL   Calcium 8.4 (L) 8.9 - 10.3 mg/dL   GFR calc non Af Amer >60 >60 mL/min   GFR calc Af Amer >60 >60 mL/min   Anion gap 8 5 - 15    Comment: Performed at [facility], [address], [place], [place] [address]  Troponin I (High Sensitivity)     Status: None   Collection Time: 06/25/19  5:33 PM  Result Value Ref Range   Troponin I (High Sensitivity) 14 <18 ng/L    Comment: (NOTE) Elevated high sensitivity troponin I (hsTnI) values and significant  changes across serial measurements may suggest ACS but many other  chronic and acute conditions are known  to elevate hsTnI results.  Refer to the "Links" section for chest pain algorithms and additional  guidance. Performed at [facility], [address], [place], [place] [address]   Troponin I (High Sensitivity)     Status: Abnormal   Collection Time: 06/25/19  9:12 PM  Result Value Ref Range   Troponin I (High Sensitivity) 26 (H) <18 ng/L    Comment: (NOTE) Elevated high sensitivity troponin I (hsTnI) values and significant  changes across serial measurements may suggest ACS but many other  chronic and acute conditions are known to elevate hsTnI results.  Refer to the "Links" section for chest pain algorithms and additional  guidance. Performed at [facility], [address] [place], [place] [address]     Chemistries  Recent Labs  Lab 06/25/19 1733  NA 138  K 3.8  CL 105  CO2 25  GLUCOSE 236*  BUN 12  CREATININE 1.03  CALCIUM 8.4*   ------------------------------------------------------------------------------------------------------------------  ------------------------------------------------------------------------------------------------------------------ GFR: Estimated Creatinine Clearance: 95.8 mL/min (by C-G formula based on SCr of 1.03 mg/dL). Liver Function Tests: No results for input(s): AST, ALT, ALKPHOS, BILITOT, PROT, ALBUMIN in the last 168 hours. No results for input(s): LIPASE, AMYLASE in the last 168 hours. No results for input(s): AMMONIA in the last 168 hours. Coagulation Profile: No results for input(s): INR, PROTIME in the last 168 hours. Cardiac Enzymes: No results for input(s): CKTOTAL, CKMB, CKMBINDEX, TROPONINI in the last 168 hours. BNP (last 3 results) No results for input(s): PROBNP in the last 8760 hours. HbA1C: No results for input(s): HGBA1C in the last 72 hours. CBG: No results for input(s): GLUCAP in the last 168 hours. Lipid Profile: No results for input(s): CHOL, HDL, LDLCALC, TRIG, CHOLHDL, LDLDIRECT in the last 72 hours. Thyroid  Function Tests: No results for input(s): TSH, T4TOTAL, FREET4, T3FREE, THYROIDAB in the last 72 hours. Anemia Panel: No results for input(s): VITAMINB12, FOLATE, FERRITIN, TIBC, IRON, RETICCTPCT in the last 72 hours.  --------------------------------------------------------------------------------------------------------------- Urine analysis:    Component Value Date/Time   COLORURINE YELLOW 11/03/2018 2115   APPEARANCEUR CLEAR 11/03/2018 2115   LABSPEC 1.033 (H) 11/03/2018 2115   PHURINE 5.0 11/03/2018 2115   GLUCOSEU >=500 (A) 11/03/2018 2115   GLUCOSEU >=1000 (A) 12/11/2016 1622   HGBUR NEGATIVE 11/03/2018 2115   BILIRUBINUR NEGATIVE 11/03/2018 2115   KETONESUR 5 (A) 11/03/2018 2115   PROTEINUR 30 (A) 11/03/2018 2115   UROBILINOGEN 0.2 12/11/2016 1622   NITRITE NEGATIVE 11/03/2018 2115   LEUKOCYTESUR NEGATIVE 11/03/2018 2115      Imaging Results:    Dg Chest Portable 1 View  Result Date: 06/25/2019 CLINICAL DATA:  Radiograph 06/08/2019 EXAM: PORTABLE CHEST 1 VIEW COMPARISON:  None. FINDINGS: Low volumes and atelectasis. No consolidation, features of edema, pneumothorax, or effusion. The cardiomediastinal  contours are unremarkable. Implantable loop recorder noted over the left chest wall. Sclerotic changes in the left humeral head may reflect sequela of prior bone infarct. Cervical fusion hardware incompletely assessed on this exam. Likely postsurgical changes of the distal left clavicle. No acute osseous or soft tissue abnormality. IMPRESSION: 1. Low lung volumes with atelectasis. No acute cardiopulmonary abnormality. 2. Implantable loop recorder over the left chest wall. Electronically Signed   By: Kreg Shropshire M.D.   On: 06/25/2019 17:45      Assessment & Plan:    Principal Problem:   Tachycardia Active Problems:   Type 2 diabetes mellitus with complication, with long-term current use of insulin (HCC)   Hypertension   Hyperlipidemia  Tachycardia Check d dimer Check  trop  Check Tsh If D dimer positive then CTA chest Cardiology consult requested in computer for evaluation of tachycardia, elevated trop   Elevated troponin I Check hga1c, lipid Aspirin  po qday Cont Toprol XL as below Cont Lipitor as below Cardiology consulted as above  Dm2 Hold Trulicity Hold Metformin in case requires CTA chest or catheterization fsbs q4h, ISS  Hypertension Cont  Troprol XL  po qday  Hyperlipidemia Cont Lipitor  po qhs  Hypothyroidism Cont Euthyrox 25 micrograms po qday  Gerd cont PPI  Anxiety Cont Lexapro  po qday  DVT Prophylaxis-   Lovenox - SCDs   AM Labs Ordered, also please review Full Orders  Family Communication: Admission, patients condition and plan of care including tests being ordered have been discussed with the patient who indicate understanding and agree with the plan and Code Status.  Code Status:  FULL CODE per patient  Admission status: Observation: Based on patients clinical presentation and evaluation of above clinical data, I have made determination that patient meets observation criteria at this time.     Time spent in minutes : 70 minutes   Pearson Grippe M.D on 06/25/2019 at 10:54 PM

## 2019-06-25 NOTE — ED Notes (Signed)
Pt with emesis in triage room

## 2019-06-25 NOTE — ED Triage Notes (Signed)
Pt with holter monitor

## 2019-06-25 NOTE — ED Triage Notes (Signed)
Pt with left chest pressure since 1030 this morning since getting up from bed.  + sob, denies N/V, +diaphoresis

## 2019-06-26 ENCOUNTER — Encounter (HOSPITAL_COMMUNITY)
Admission: EM | Disposition: A | Discharge status: Home / Self Care | Payer: Self-pay | Source: Home / Self Care | Attending: Cardiology

## 2019-06-26 ENCOUNTER — Telehealth: Payer: Self-pay | Admitting: Student

## 2019-06-26 ENCOUNTER — Other Ambulatory Visit: Payer: Self-pay

## 2019-06-26 DIAGNOSIS — Z8639 Personal history of other endocrine, nutritional and metabolic disease: Secondary | ICD-10-CM

## 2019-06-26 DIAGNOSIS — Z794 Long term (current) use of insulin: Secondary | ICD-10-CM | POA: Diagnosis not present

## 2019-06-26 DIAGNOSIS — Z79899 Other long term (current) drug therapy: Secondary | ICD-10-CM | POA: Diagnosis not present

## 2019-06-26 DIAGNOSIS — R079 Chest pain, unspecified: Secondary | ICD-10-CM | POA: Diagnosis not present

## 2019-06-26 DIAGNOSIS — E118 Type 2 diabetes mellitus with unspecified complications: Secondary | ICD-10-CM | POA: Diagnosis not present

## 2019-06-26 DIAGNOSIS — E1165 Type 2 diabetes mellitus with hyperglycemia: Secondary | ICD-10-CM | POA: Diagnosis not present

## 2019-06-26 DIAGNOSIS — I472 Ventricular tachycardia: Secondary | ICD-10-CM

## 2019-06-26 DIAGNOSIS — I471 Supraventricular tachycardia, unspecified: Secondary | ICD-10-CM

## 2019-06-26 DIAGNOSIS — R69 Illness, unspecified: Secondary | ICD-10-CM | POA: Diagnosis not present

## 2019-06-26 DIAGNOSIS — F419 Anxiety disorder, unspecified: Secondary | ICD-10-CM | POA: Diagnosis present

## 2019-06-26 DIAGNOSIS — E782 Mixed hyperlipidemia: Secondary | ICD-10-CM | POA: Diagnosis not present

## 2019-06-26 DIAGNOSIS — Z87442 Personal history of urinary calculi: Secondary | ICD-10-CM | POA: Diagnosis not present

## 2019-06-26 DIAGNOSIS — Z825 Family history of asthma and other chronic lower respiratory diseases: Secondary | ICD-10-CM | POA: Diagnosis not present

## 2019-06-26 DIAGNOSIS — K219 Gastro-esophageal reflux disease without esophagitis: Secondary | ICD-10-CM | POA: Diagnosis not present

## 2019-06-26 DIAGNOSIS — R000 Tachycardia, unspecified: Secondary | ICD-10-CM | POA: Diagnosis not present

## 2019-06-26 DIAGNOSIS — I10 Essential (primary) hypertension: Secondary | ICD-10-CM

## 2019-06-26 DIAGNOSIS — Z8619 Personal history of other infectious and parasitic diseases: Secondary | ICD-10-CM | POA: Diagnosis not present

## 2019-06-26 DIAGNOSIS — I2510 Atherosclerotic heart disease of native coronary artery without angina pectoris: Secondary | ICD-10-CM | POA: Diagnosis not present

## 2019-06-26 DIAGNOSIS — Z9049 Acquired absence of other specified parts of digestive tract: Secondary | ICD-10-CM | POA: Diagnosis not present

## 2019-06-26 DIAGNOSIS — E039 Hypothyroidism, unspecified: Secondary | ICD-10-CM | POA: Diagnosis not present

## 2019-06-26 DIAGNOSIS — E785 Hyperlipidemia, unspecified: Secondary | ICD-10-CM | POA: Diagnosis not present

## 2019-06-26 DIAGNOSIS — I214 Non-ST elevation (NSTEMI) myocardial infarction: Secondary | ICD-10-CM | POA: Diagnosis present

## 2019-06-26 DIAGNOSIS — Z833 Family history of diabetes mellitus: Secondary | ICD-10-CM | POA: Diagnosis not present

## 2019-06-26 DIAGNOSIS — Z8249 Family history of ischemic heart disease and other diseases of the circulatory system: Secondary | ICD-10-CM | POA: Diagnosis not present

## 2019-06-26 DIAGNOSIS — Z96651 Presence of right artificial knee joint: Secondary | ICD-10-CM | POA: Diagnosis present

## 2019-06-26 DIAGNOSIS — Z823 Family history of stroke: Secondary | ICD-10-CM | POA: Diagnosis not present

## 2019-06-26 DIAGNOSIS — Z20828 Contact with and (suspected) exposure to other viral communicable diseases: Secondary | ICD-10-CM | POA: Diagnosis not present

## 2019-06-26 HISTORY — DX: Supraventricular tachycardia: I47.1

## 2019-06-26 HISTORY — DX: Non-ST elevation (NSTEMI) myocardial infarction: I21.4

## 2019-06-26 HISTORY — DX: Supraventricular tachycardia, unspecified: I47.10

## 2019-06-26 HISTORY — PX: LEFT HEART CATH AND CORONARY ANGIOGRAPHY: CATH118249

## 2019-06-26 LAB — CBC
HCT: 41.4 % (ref 39.0–52.0)
Hemoglobin: 14.4 g/dL (ref 13.0–17.0)
MCH: 31.2 pg (ref 26.0–34.0)
MCHC: 34.8 g/dL (ref 30.0–36.0)
MCV: 89.8 fL (ref 80.0–100.0)
Platelets: 243 10*3/uL (ref 150–400)
RBC: 4.61 MIL/uL (ref 4.22–5.81)
RDW: 12.2 % (ref 11.5–15.5)
WBC: 8.2 10*3/uL (ref 4.0–10.5)
nRBC: 0 % (ref 0.0–0.2)

## 2019-06-26 LAB — COMPREHENSIVE METABOLIC PANEL
ALT: 26 U/L (ref 0–44)
AST: 17 U/L (ref 15–41)
Albumin: 3.5 g/dL (ref 3.5–5.0)
Alkaline Phosphatase: 56 U/L (ref 38–126)
Anion gap: 9 (ref 5–15)
BUN: 11 mg/dL (ref 6–20)
CO2: 25 mmol/L (ref 22–32)
Calcium: 8.6 mg/dL — ABNORMAL LOW (ref 8.9–10.3)
Chloride: 104 mmol/L (ref 98–111)
Creatinine, Ser: 1.1 mg/dL (ref 0.61–1.24)
GFR calc Af Amer: >60 mL/min (ref >60)
GFR calc non Af Amer: >60 mL/min (ref >60)
Glucose, Bld: 199 mg/dL — ABNORMAL HIGH (ref 70–99)
Potassium: 4.8 mmol/L (ref 3.5–5.1)
Sodium: 138 mmol/L (ref 135–145)
Total Bilirubin: 1.6 mg/dL — ABNORMAL HIGH (ref 0.3–1.2)
Total Protein: 6.2 g/dL — ABNORMAL LOW (ref 6.5–8.1)

## 2019-06-26 LAB — GLUCOSE, CAPILLARY: Glucose-Capillary: 138 mg/dL — ABNORMAL HIGH (ref 70–99)

## 2019-06-26 LAB — HEMOGLOBIN A1C
Hgb A1c MFr Bld: 8 % — ABNORMAL HIGH (ref 4.8–5.6)
Mean Plasma Glucose: 182.9 mg/dL

## 2019-06-26 LAB — LIPID PANEL
Cholesterol: 155 mg/dL (ref 0–200)
HDL: 27 mg/dL — ABNORMAL LOW (ref >40)
LDL Cholesterol: 77 mg/dL (ref 0–99)
Total CHOL/HDL Ratio: 5.7 RATIO
Triglycerides: 254 mg/dL — ABNORMAL HIGH (ref <150)
VLDL: 51 mg/dL — ABNORMAL HIGH (ref 0–40)

## 2019-06-26 LAB — SARS CORONAVIRUS 2 BY RT PCR (HOSPITAL ORDER, PERFORMED IN ~~LOC~~ HOSPITAL LAB): SARS Coronavirus 2: NEGATIVE

## 2019-06-26 LAB — CBG MONITORING, ED
Glucose-Capillary: 159 mg/dL — ABNORMAL HIGH (ref 70–99)
Glucose-Capillary: 199 mg/dL — ABNORMAL HIGH (ref 70–99)

## 2019-06-26 SURGERY — LEFT HEART CATH AND CORONARY ANGIOGRAPHY
Anesthesia: LOCAL

## 2019-06-26 MED ORDER — VERAPAMIL HCL 2.5 MG/ML IV SOLN
INTRAVENOUS | Status: AC
  Filled 2019-06-26: qty 2

## 2019-06-26 MED ORDER — ASPIRIN EC 81 MG PO TBEC
81.0000 mg | DELAYED_RELEASE_TABLET | Freq: Every day | ORAL | Status: DC
  Administered 2019-06-27 – 2019-06-28 (×2): 81 mg via ORAL
  Filled 2019-06-26 (×2): qty 1

## 2019-06-26 MED ORDER — METOPROLOL TARTRATE 5 MG/5ML IV SOLN
5.0000 mg | Freq: Once | INTRAVENOUS | Status: AC
  Administered 2019-06-26: 5 mg via INTRAVENOUS

## 2019-06-26 MED ORDER — HEPARIN SODIUM (PORCINE) 1000 UNIT/ML IJ SOLN
INTRAMUSCULAR | Status: DC | PRN
  Administered 2019-06-26: 5000 [IU] via INTRAVENOUS

## 2019-06-26 MED ORDER — VERAPAMIL HCL 2.5 MG/ML IV SOLN
INTRAVENOUS | Status: DC | PRN
  Administered 2019-06-26: 10 mL via INTRA_ARTERIAL

## 2019-06-26 MED ORDER — SODIUM CHLORIDE 0.9 % WEIGHT BASED INFUSION: 1.0000 mL/kg/h | INTRAVENOUS | Status: DC

## 2019-06-26 MED ORDER — LORATADINE 10 MG PO TABS
10.0000 mg | ORAL_TABLET | Freq: Every morning | ORAL | Status: DC
  Administered 2019-06-26 – 2019-06-28 (×3): 10 mg via ORAL
  Filled 2019-06-26 (×3): qty 1

## 2019-06-26 MED ORDER — LIDOCAINE HCL (PF) 1 % IJ SOLN
INTRAMUSCULAR | Status: DC | PRN
  Administered 2019-06-26: 2 mL

## 2019-06-26 MED ORDER — SODIUM CHLORIDE 0.9 % WEIGHT BASED INFUSION: 3.0000 mL/kg/h | INTRAVENOUS | Status: DC

## 2019-06-26 MED ORDER — MIDAZOLAM HCL 2 MG/2ML IJ SOLN
INTRAMUSCULAR | Status: AC
  Filled 2019-06-26: qty 2

## 2019-06-26 MED ORDER — COQ-10 75 MG PO CAPS: 75.0000 mg | ORAL_CAPSULE | Freq: Every day | ORAL | Status: DC

## 2019-06-26 MED ORDER — NITROGLYCERIN 1 MG/10 ML FOR IR/CATH LAB
INTRA_ARTERIAL | Status: AC
  Filled 2019-06-26: qty 10

## 2019-06-26 MED ORDER — HYDRALAZINE HCL 20 MG/ML IJ SOLN: 10.0000 mg | INTRAMUSCULAR | Status: DC | PRN

## 2019-06-26 MED ORDER — ATORVASTATIN CALCIUM 10 MG PO TABS
5.0000 mg | ORAL_TABLET | Freq: Every day | ORAL | Status: DC
  Administered 2019-06-27: 5 mg via ORAL
  Filled 2019-06-26: qty 1

## 2019-06-26 MED ORDER — PANTOPRAZOLE SODIUM 40 MG PO TBEC
40.0000 mg | DELAYED_RELEASE_TABLET | Freq: Every day | ORAL | Status: DC
  Administered 2019-06-26 – 2019-06-28 (×3): 40 mg via ORAL
  Filled 2019-06-26 (×4): qty 1

## 2019-06-26 MED ORDER — DILTIAZEM HCL-DEXTROSE 125-5 MG/125ML-% IV SOLN (PREMIX)
5.0000 mg/h | INTRAVENOUS | Status: DC
  Administered 2019-06-26 (×2): 5 mg/h via INTRAVENOUS
  Filled 2019-06-26 (×2): qty 125

## 2019-06-26 MED ORDER — FENTANYL CITRATE (PF) 100 MCG/2ML IJ SOLN
INTRAMUSCULAR | Status: DC | PRN
  Administered 2019-06-26: 25 ug via INTRAVENOUS

## 2019-06-26 MED ORDER — SODIUM CHLORIDE 0.9 % IV SOLN
INTRAVENOUS | Status: DC
  Administered 2019-06-26: 03:00:00 via INTRAVENOUS

## 2019-06-26 MED ORDER — METOPROLOL TARTRATE 5 MG/5ML IV SOLN
5.0000 mg | Freq: Once | INTRAVENOUS | Status: AC
  Administered 2019-06-26: 5 mg via INTRAVENOUS
  Filled 2019-06-26: qty 5

## 2019-06-26 MED ORDER — ESCITALOPRAM OXALATE 10 MG PO TABS
20.0000 mg | ORAL_TABLET | Freq: Every day | ORAL | Status: DC
  Administered 2019-06-26 – 2019-06-28 (×3): 20 mg via ORAL
  Filled 2019-06-26 (×3): qty 2

## 2019-06-26 MED ORDER — METOPROLOL TARTRATE 5 MG/5ML IV SOLN
INTRAVENOUS | Status: AC
  Filled 2019-06-26: qty 5

## 2019-06-26 MED ORDER — ATORVASTATIN CALCIUM 10 MG PO TABS
5.0000 mg | ORAL_TABLET | ORAL | Status: DC
  Administered 2019-06-26: 5 mg via ORAL
  Filled 2019-06-26: qty 1

## 2019-06-26 MED ORDER — ASPIRIN EC 325 MG PO TBEC
325.0000 mg | DELAYED_RELEASE_TABLET | Freq: Every day | ORAL | Status: DC
  Administered 2019-06-26: 325 mg via ORAL
  Filled 2019-06-26: qty 1

## 2019-06-26 MED ORDER — INSULIN DETEMIR 100 UNIT/ML ~~LOC~~ SOLN
30.0000 [IU] | Freq: Every day | SUBCUTANEOUS | Status: DC
  Administered 2019-06-27 (×2): 30 [IU] via SUBCUTANEOUS
  Filled 2019-06-26 (×5): qty 0.3

## 2019-06-26 MED ORDER — HEPARIN (PORCINE) IN NACL 1000-0.9 UT/500ML-% IV SOLN
INTRAVENOUS | Status: DC | PRN
  Administered 2019-06-26 (×2): 500 mL

## 2019-06-26 MED ORDER — INSULIN ASPART 100 UNIT/ML ~~LOC~~ SOLN: 0.0000 [IU] | Freq: Every day | SUBCUTANEOUS | Status: DC

## 2019-06-26 MED ORDER — IOHEXOL 350 MG/ML SOLN
INTRAVENOUS | Status: DC | PRN
  Administered 2019-06-26: 90 mL

## 2019-06-26 MED ORDER — ONDANSETRON HCL 4 MG/2ML IJ SOLN: 4.0000 mg | Freq: Four times a day (QID) | INTRAMUSCULAR | Status: DC | PRN

## 2019-06-26 MED ORDER — TOPIRAMATE 25 MG PO TABS
50.0000 mg | ORAL_TABLET | Freq: Every day | ORAL | Status: DC
  Administered 2019-06-26 – 2019-06-27 (×3): 50 mg via ORAL
  Filled 2019-06-26 (×3): qty 2

## 2019-06-26 MED ORDER — MIDAZOLAM HCL 2 MG/2ML IJ SOLN
INTRAMUSCULAR | Status: DC | PRN
  Administered 2019-06-26: 1 mg via INTRAVENOUS

## 2019-06-26 MED ORDER — SODIUM CHLORIDE 0.9 % IV SOLN: 250.0000 mL | INTRAVENOUS | Status: DC | PRN

## 2019-06-26 MED ORDER — LIDOCAINE HCL (PF) 1 % IJ SOLN
INTRAMUSCULAR | Status: AC
  Filled 2019-06-26: qty 30

## 2019-06-26 MED ORDER — METOPROLOL TARTRATE 5 MG/5ML IV SOLN: 5.0000 mg | Freq: Four times a day (QID) | INTRAVENOUS | Status: DC | PRN

## 2019-06-26 MED ORDER — CLOPIDOGREL BISULFATE 75 MG PO TABS
75.0000 mg | ORAL_TABLET | Freq: Every day | ORAL | Status: DC
  Administered 2019-06-27 – 2019-06-28 (×2): 75 mg via ORAL
  Filled 2019-06-26 (×2): qty 1

## 2019-06-26 MED ORDER — SODIUM CHLORIDE 0.9% FLUSH: 3.0000 mL | INTRAVENOUS | Status: DC | PRN

## 2019-06-26 MED ORDER — ASPIRIN 81 MG PO CHEW: 81.0000 mg | CHEWABLE_TABLET | Freq: Every day | ORAL | Status: DC

## 2019-06-26 MED ORDER — CLOPIDOGREL BISULFATE 300 MG PO TABS
ORAL_TABLET | ORAL | Status: DC | PRN
  Administered 2019-06-26: 300 mg via ORAL

## 2019-06-26 MED ORDER — NITROGLYCERIN 1 MG/10 ML FOR IR/CATH LAB
INTRA_ARTERIAL | Status: DC | PRN
  Administered 2019-06-26 (×2): 200 ug via INTRACORONARY

## 2019-06-26 MED ORDER — HEPARIN SODIUM (PORCINE) 1000 UNIT/ML IJ SOLN
INTRAMUSCULAR | Status: AC
  Filled 2019-06-26: qty 1

## 2019-06-26 MED ORDER — CLOPIDOGREL BISULFATE 300 MG PO TABS
ORAL_TABLET | ORAL | Status: AC
  Filled 2019-06-26: qty 1

## 2019-06-26 MED ORDER — SODIUM CHLORIDE 0.9% FLUSH: 3.0000 mL | Freq: Two times a day (BID) | INTRAVENOUS | Status: DC

## 2019-06-26 MED ORDER — HEPARIN (PORCINE) IN NACL 1000-0.9 UT/500ML-% IV SOLN
INTRAVENOUS | Status: AC
  Filled 2019-06-26: qty 1000

## 2019-06-26 MED ORDER — LEVOTHYROXINE SODIUM 25 MCG PO TABS
25.0000 ug | ORAL_TABLET | Freq: Every day | ORAL | Status: DC
  Administered 2019-06-26 – 2019-06-28 (×3): 25 ug via ORAL
  Filled 2019-06-26 (×3): qty 1

## 2019-06-26 MED ORDER — METOPROLOL SUCCINATE ER 50 MG PO TB24
50.0000 mg | ORAL_TABLET | Freq: Every day | ORAL | Status: DC
  Administered 2019-06-26 – 2019-06-28 (×3): 50 mg via ORAL
  Filled 2019-06-26: qty 2
  Filled 2019-06-26 (×3): qty 1

## 2019-06-26 MED ORDER — INSULIN ASPART 100 UNIT/ML ~~LOC~~ SOLN
0.0000 [IU] | Freq: Three times a day (TID) | SUBCUTANEOUS | Status: DC
  Administered 2019-06-26: 3 [IU] via SUBCUTANEOUS
  Administered 2019-06-27: 2 [IU] via SUBCUTANEOUS
  Administered 2019-06-27 – 2019-06-28 (×3): 3 [IU] via SUBCUTANEOUS
  Filled 2019-06-26: qty 1

## 2019-06-26 MED ORDER — HEPARIN (PORCINE) 25000 UT/250ML-% IV SOLN
1350.0000 [IU]/h | INTRAVENOUS | Status: DC
  Administered 2019-06-27: 1350 [IU]/h via INTRAVENOUS
  Filled 2019-06-26: qty 250

## 2019-06-26 MED ORDER — FENTANYL CITRATE (PF) 100 MCG/2ML IJ SOLN
INTRAMUSCULAR | Status: AC
  Filled 2019-06-26: qty 2

## 2019-06-26 SURGICAL SUPPLY — 11 items
CATH INFINITI 5 FR JL3.5 (CATHETERS) ×1 IMPLANT
CATH OPTITORQUE TIG 4.0 5F (CATHETERS) ×1 IMPLANT
DEVICE RAD COMP TR BAND LRG (VASCULAR PRODUCTS) ×1 IMPLANT
GLIDESHEATH SLEND SS 6F .021 (SHEATH) ×1 IMPLANT
GUIDEWIRE INQWIRE 1.5J.035X260 (WIRE) IMPLANT
INQWIRE 1.5J .035X260CM (WIRE) ×2
KIT HEART LEFT (KITS) ×2 IMPLANT
PACK CARDIAC CATHETERIZATION (CUSTOM PROCEDURE TRAY) ×2 IMPLANT
SHEATH PROBE COVER 6X72 (BAG) ×1 IMPLANT
TRANSDUCER W/STOPCOCK (MISCELLANEOUS) ×2 IMPLANT
TUBING CIL FLEX 10 FLL-RA (TUBING) ×2 IMPLANT

## 2019-06-26 NOTE — ED Notes (Signed)
Dr. Maudie Mercury notified second time about patient's heart rate from 155- 160. Dr. Maudie Mercury gave order for second dose of metoprolol 5 mg's by IV.

## 2019-06-26 NOTE — Progress Notes (Addendum)
ANTICOAGULATION CONSULT NOTE - Initial Consult  Pharmacy Consult for heparin  Indication: chest pain/ACS  Allergies  Allergen Reactions  . Rosuvastatin Other (See Comments)  . Toradol [Ketorolac Tromethamine] Itching    Patient Measurements: Height: 6' (182.9 cm) Weight: 215 lb (97.5 kg) IBW/kg (Calculated) : 77.6   Vital Signs: BP: 149/83 (11/20 1755) Pulse Rate: 67 (11/20 1755)  Labs: Recent Labs    06/25/19 1522 06/25/19 1733 06/25/19 2112 06/26/19 0409  HGB 15.1  --   --  14.4  HCT 43.2  --   --  41.4  PLT 263  --   --  243  CREATININE  --  1.03  --  1.10  TROPONINIHS  --  14 26*  --     Estimated Creatinine Clearance: 89.7 mL/min (by C-G formula based on SCr of 1.1 mg/dL).   Medical History: Past Medical History:  Diagnosis Date  . Anxiety   . Chronic headaches   . Chronic neck pain   . GERD (gastroesophageal reflux disease)    occasional - OTC as needed  . History of chicken pox   . History of kidney stones   . History of shingles   . Hypertension    under control with med., had been on med. since age 28  . Insomnia 09/13/2016  . Insulin dependent diabetes mellitus    Follows with endocrinology, Dr Chestine Spore reports his last hgba1c was 7.3   . Non-insulin dependent type 2 diabetes mellitus (HCC)   . Obesity 01/30/2017  . Occipital neuralgia   . Osteoarthritis    right knee  . PONV (postoperative nausea and vomiting)   . PONV (postoperative nausea and vomiting)   . Preventative health care 08/14/2013  . Tachycardia   . Urinary frequency 12/11/2016    Medications:  Medications Prior to Admission  Medication Sig Dispense Refill Last Dose  . atorvastatin (LIPITOR) 10 MG tablet Take 0.5 tablets (5 mg total) by mouth every other day. 45 tablet 0 06/25/2019 at Unknown time  . Coenzyme Q10 (COQ-10) 75 MG CAPS Take 75 mg by mouth daily.  0 06/25/2019 at Unknown time  . diclofenac Sodium (VOLTAREN) 1 % GEL Apply 2 g topically Nightly.   06/24/2019  .  Dulaglutide (TRULICITY) 1.5 MG/0.5ML SOPN Inject 1.5 mg into the skin once a week. NEED APPOINTMENT FOR FURTHER REFILLS (Patient taking differently: Inject 1.5 mg into the skin once a week. ) 12 mL 0 06/23/2019  . escitalopram (LEXAPRO) 20 MG tablet Take 1 tablet by mouth once daily (Patient taking differently: Take 20 mg by mouth daily. ) 90 tablet 0 06/25/2019 at Unknown time  . EUTHYROX 25 MCG tablet TAKE 1 TABLET BY MOUTH ONCE DAILY BEFORE BREAKFAST (Patient taking differently: Take 25 mcg by mouth daily before breakfast. ) 90 tablet 0 06/25/2019 at Unknown time  . insulin aspart (NOVOLOG FLEXPEN) 100 UNIT/ML FlexPen Inject 25-35 units 3 times daily with meals (Patient taking differently: Inject 10 Units into the skin 3 (three) times daily with meals. ) 30 mL 5 Past Week at Unknown time  . insulin detemir (LEVEMIR) 100 UNIT/ML injection Inject 0.75 mLs (75 Units total) into the skin at bedtime. (Patient taking differently: Inject 60 Units into the skin at bedtime. ) 80 mL 5 06/24/2019 at Unknown time  . lansoprazole (PREVACID) 30 MG capsule Take 30 mg by mouth every morning.    06/25/2019 at Unknown time  . loratadine (CLARITIN) 10 MG tablet Take 10 mg by mouth every morning.  06/25/2019 at Unknown time  . metFORMIN (GLUCOPHAGE-XR) 500 MG 24 hr tablet Take 1 tablet (500 mg total) by mouth 2 (two) times daily with a meal. 60 tablet 11 06/24/2019  . metoprolol succinate (TOPROL-XL) 50 MG 24 hr tablet Take 1 tablet (50 mg total) by mouth daily. Take with or immediately following a meal. 90 tablet 3 06/25/2019 at 0900  . naproxen (NAPROSYN) 250 MG tablet Take 250 mg by mouth 2 (two) times daily with a meal.   Past Week at Unknown time  . topiramate (TOPAMAX) 50 MG tablet Take 1 tablet (50 mg total) by mouth at bedtime. 30 tablet 6 06/25/2019 at Unknown time  . sildenafil (REVATIO) 20 MG tablet Take 20 mg by mouth daily as needed.    Not Taking at Unknown time    Assessment: 58 yo male with CP/NSVT  s/p cath. Pharmacy consulted to dose heparin. Plans are noted for 48 hours  Goal of Therapy:  Heparin level 0.3-0.7 units/ml Monitor platelets by anticoagulation protocol: Yes   Plan:  -restart heparin 8 hours post sheath removal at 1350 units/hr -Heparin level in 6 hours and daily wth CBC daily   Hildred Laser, PharmD Clinical Pharmacist **Pharmacist phone directory can now be found on [email or web address] ([email or web address]).  Listed under [place].

## 2019-06-26 NOTE — Consult Note (Addendum)
Cardiology Consult    Patient ID: Sean Horn; [id number]; 11/28/1960   Admit date: 06/25/2019 Date of Consult: 06/26/2019  Primary Care Provider: Pearline Horn, Sean C, MD Primary Cardiologist: Sean RippleKardie Tobb, DO   Patient Profile    Sean Horn is a 58 y.o. male with past medical history of SVT, HTN, HLD and IDDM who is being seen today for the evaluation of chest pain, SVT and NSVT at the request of Sean Horn.   History of Present Illness    Sean Horn was last evaluated by the cardiology service on 06/12/2019 during an ED evaluation for palpitations and presyncope. Troponin values peaked at 142 but EKG showed no acute ischemic changes. He had episodes of narrow complex tachycardia during admission but did have a 36 beat of a wide-complex tachycardia which was concerning for VT or possible SVT with aberrancy. This was reviewed with the EP and thought to be most consistent with SVT with aberrancy and he was transitioned from Coreg to Toprol-XL with placement of a monitor. He did follow-up with Sean Horn as an outpatient and it was recommended he have a Coronary CT for ischemic evaluation. This has not yet been scheduled or performed.  In the interim, his event monitor has shown episodes of NSVT as outlined by phone notes and he was overall asymptomatic with most episodes by review of notes. Was reviewed with Sean Horn and was recommended to titrate beta-blocker therapy and to keep scheduled follow-up on 07/14/2019 unless he had syncope in the interim.  He presented to [facility] yesterday afternoon for evaluation of dyspnea, chest pain and diaphoresis. He reports having intermittent episodes of palpitations ever since his last ED evaluation and that his symptoms did improve with titration of Toprol-XL a few days ago. Starting yesterday, he developed worsening chest discomfort which felt different from his prior episodes with associated palpitations and dyspnea.  He denies any specific  orthopnea, PND or lower extremity edema. Had presyncope but denies any actual syncopal events.   Initial labs show BBC 10.3, Hgb 15.1, platelets 263, Na+ 138, K+ 3.8 and creatinine 1.03. TSH 1.714. Initial HS Troponin 14 with repeat of 26. COVID testing pending. EKG shows sinus tachycardia, HR 103 with no acute ST changes. On telemetry, he has been having frequent episodes of a narrow complex tachycardia, most consistent with SVT but also has episodes of NSVT which can last for 20 beats. The preceding rhythm for his NSVT has been NSR unlike his previous ED evaluation.     Past Medical History:  Diagnosis Date   Anxiety    Chronic headaches    Chronic neck pain    GERD (gastroesophageal reflux disease)    occasional - OTC as needed   History of chicken pox    History of kidney stones    History of shingles    Hypertension    under control with med., had been on med. since age 58   Insomnia 09/13/2016   Insulin dependent diabetes mellitus    Follows with endocrinology, Sean Horn reports his last hgba1c was 7.3    Non-insulin dependent type 2 diabetes mellitus (HCC)    Obesity 01/30/2017   Occipital neuralgia    Osteoarthritis    right knee   PONV (postoperative nausea and vomiting)    PONV (postoperative nausea and vomiting)    Preventative health care 08/14/2013   Tachycardia    Urinary frequency 12/11/2016    Past Surgical History:  Procedure Laterality Date  ANTERIOR CERVICAL DECOMP/DISCECTOMY FUSION N/A 01/16/2018   Procedure: ANTERIOR CERVICAL DECOMPRESSION/DISCECTOMY FUSION CERVICAL FOUR-FIVE, CERVICAL FIVE-SIX;  Surgeon: Sean Renshaw, MD;  Location: [facility] OR;  Service: Neurosurgery;  Laterality: N/A;  ANTERIOR CERVICAL DECOMPRESSION/DISCECTOMY FUSION CERVICAL FOUR-FIVE, CERVICAL FIVE-SIX   CHOLECYSTECTOMY  1980's   CYSTOSCOPY/RETROGRADE/URETEROSCOPY/STONE EXTRACTION WITH BASKET Right 08/27/2003   and double J stent placement   INGUINAL HERNIA REPAIR Left  1990's   KNEE ARTHROSCOPY Left 1980's   KNEE ARTHROSCOPY Right 2013   KNEE ARTHROSCOPY Right 06/30/2013   Procedure: RIGHT KNEE ARTHROSCOPY AND SYNOVECTOMY;  Surgeon: Sean Ochs, MD;  Location: [facility];  Service: Orthopedics;  Laterality: Right;   LITHOTRIPSY     multiple, b/l   SHOULDER ADHESION RELEASE Left ` 1998   SHOULDER ARTHROSCOPY W/ ROTATOR CUFF REPAIR Left 10/20/2004   x 4, had to have part of clavile removed. torn rotator cuff multiple times, had to place screws    SHOULDER ARTHROSCOPY W/ SUPERIOR LABRAL ANTERIOR POSTERIOR LESION REPAIR Left 12/22/2004   TOTAL KNEE ARTHROPLASTY Right 12/23/2012   x3, 2 arthroscopies and a TKR,  TOTAL KNEE ARTHROPLASTY;  Surgeon: Sean Ochs, MD;  Location: [facility] OR;  Service: Orthopedics;  Laterality: Right;  DEPUY, RNFA   WRIST SURGERY Right    x 3, torn ligaments, pins placed then infected, then fused with plate     Home Medications:  Prior to Admission medications   Medication Sig Start Date End Date Taking? Authorizing Provider  atorvastatin (LIPITOR) 10 MG tablet Take 0.5 tablets (5 mg total) by mouth every other day. 05/05/19  Yes Sean Canary, MD  Coenzyme Q10 (COQ-10) 75 MG CAPS Take 75 mg by mouth daily. 11/06/18  Yes Sean Canary, MD  diclofenac Sodium (VOLTAREN) 1 % GEL Apply 2 g topically Nightly.   Yes [provider]  Dulaglutide (TRULICITY) 1.5 MG/0.5ML SOPN Inject 1.5 mg into the skin once a week. NEED APPOINTMENT FOR FURTHER REFILLS Patient taking differently: Inject 1.5 mg into the skin once a week.  05/04/19  Yes Sean Pavlov, MD  escitalopram (LEXAPRO) 20 MG tablet Take 1 tablet by mouth once daily Patient taking differently: Take 20 mg by mouth daily.  05/04/19  Yes Sean Canary, MD  EUTHYROX 25 MCG tablet TAKE 1 TABLET BY MOUTH ONCE DAILY BEFORE BREAKFAST Patient taking differently: Take 25 mcg by mouth daily before breakfast.  05/04/19  Yes Sean Canary, MD  insulin  aspart (NOVOLOG FLEXPEN) 100 UNIT/ML FlexPen Inject 25-35 units 3 times daily with meals Patient taking differently: Inject 10 Units into the skin 3 (three) times daily with meals.  07/01/18  Yes Sean Pavlov, MD  insulin detemir (LEVEMIR) 100 UNIT/ML injection Inject 0.75 mLs (75 Units total) into the skin at bedtime. Patient taking differently: Inject 60 Units into the skin at bedtime.  07/01/18  Yes Sean Pavlov, MD  lansoprazole (PREVACID) 30 MG capsule Take 30 mg by mouth every morning.    Yes [provider]  loratadine (CLARITIN) 10 MG tablet Take 10 mg by mouth every morning.    Yes [provider]  metFORMIN (GLUCOPHAGE-XR) 500 MG 24 hr tablet Take 1 tablet (500 mg total) by mouth 2 (two) times daily with a meal. 07/01/18  Yes Sean Pavlov, MD  metoprolol succinate (TOPROL-XL) 50 MG 24 hr tablet Take 1 tablet (50 mg total) by mouth daily. Take with or immediately following a meal. 06/23/19 09/21/19 Yes Strader, [place] M, PA-Horn  naproxen (NAPROSYN) 250 MG tablet  Take 250 mg by mouth 2 (two) times daily with a meal.   Yes [provider]  topiramate (TOPAMAX) 50 MG tablet Take 1 tablet (50 mg total) by mouth at bedtime. 11/18/18  Yes Anson Fret, MD  sildenafil (REVATIO) 20 MG tablet Take 20 mg by mouth daily as needed.  06/17/19   [provider]    Inpatient Medications: Scheduled Meds:  aspirin EC  325 mg Oral Daily   atorvastatin  5 mg Oral QODAY   enoxaparin (LOVENOX) injection  40 mg Subcutaneous Q24H   escitalopram  20 mg Oral Daily   insulin aspart  0-15 Units Subcutaneous TID WC   insulin aspart  0-5 Units Subcutaneous QHS   insulin detemir  30 Units Subcutaneous QHS   levothyroxine  25 mcg Oral QAC breakfast   loratadine  10 mg Oral q morning - 10a   metoprolol succinate  50 mg Oral Daily   pantoprazole  40 mg Oral Daily   sodium chloride flush  3 mL Intravenous Q12H   topiramate  50 mg Oral QHS    Continuous Infusions:  sodium chloride     sodium chloride 100 mL/hr at 06/26/19 0237   diltiazem (CARDIZEM) infusion Stopped (06/26/19 0518)   PRN Meds: sodium chloride, acetaminophen **OR** acetaminophen, metoprolol tartrate, sodium chloride flush  Allergies:    Allergies  Allergen Reactions   Rosuvastatin Other (See Comments)   Toradol [Ketorolac Tromethamine] Itching    Social History:   Social History   Socioeconomic History   Marital status: Single    Spouse name: Not on file   Number of children: 2   Years of education: Not on file   Highest education level: High school graduate  Occupational History   Occupation: Disabled  Ecologist strain: Not on file   Food insecurity    Worry: Not on file    Inability: Not on file   Transportation needs    Medical: Not on file    Non-medical: Not on file  Tobacco Use   Smoking status: Never Smoker   Smokeless tobacco: Current User    Types: Snuff  Substance and Sexual Activity   Alcohol use: No    Frequency: Never   Drug use: No   Sexual activity: Yes  Lifestyle   Physical activity    Days per week: Not on file    Minutes per session: Not on file   Stress: Not on file  Relationships   Social connections    Talks on phone: Not on file    Gets together: Not on file    Attends religious service: Not on file    Active member of club or organization: Not on file    Attends meetings of clubs or organizations: Not on file    Relationship status: Not on file   Intimate partner violence    Fear of current or ex partner: Not on file    Emotionally abused: Not on file    Physically abused: Not on file    Forced sexual activity: Not on file  Other Topics Concern   Not on file  Social History Narrative   Lives at home with his father (he takes care of his father)   Right handed   Drinks 2 cups of caffeine daily     Family History:    Family History  Problem  Relation Age of Onset   COPD Mother        smoker  Hypertension Mother    Heart disease Mother        CHF   Diabetes Father    Heart disease Father        CABG in 54's   Stroke Father    Hypertension Father    Peripheral vascular disease Father    Hypertension Son        tachycardia   Heart disease Maternal Grandfather    Arthritis Paternal Grandmother    Colon cancer Neg Hx    Rectal cancer Neg Hx    Stomach cancer Neg Hx       Review of Systems    General:  No chills, fever, night sweats or weight changes.  Cardiovascular:  No paroxysmal nocturnal dyspnea. Positive for chest pain, dyspnea on exertion and palpitations.  Dermatological: No rash, lesions/masses Respiratory: No cough, dyspnea Urologic: No hematuria, dysuria Abdominal:   No nausea, vomiting, diarrhea, bright red blood per rectum, melena, or hematemesis Neurologic:  No visual changes, wkns, changes in mental status. All other systems reviewed and are otherwise negative except as noted above.  Physical Exam/Data    Vitals:   06/26/19 0621 06/26/19 0630 06/26/19 0700 06/26/19 0714  BP:  110/83    Pulse: 73 75 64 63  Resp: Temp:      TempSrc:      SpO2: 100% 100% 95% 99%  Weight:      Height:       No intake or output data in the 24 hours ending 06/26/19 1028 Filed Weights   06/25/19 1518  Weight: 97.5 kg   Body mass index is 29.16 kg/m.   General: Pleasant Caucasian male appearing in NAD Psych: Normal affect. Neuro: Alert and oriented X 3. Moves all extremities spontaneously. HEENT: Normal  Neck: Supple without bruits or JVD. Lungs:  Resp regular and unlabored, CTA without wheezing or rales. Heart: RRR no s3, s4, or murmurs. Abdomen: Soft, non-tender, non-distended, BS + x 4.  Extremities: No clubbing, cyanosis or edema. DP/PT/Radials 2+ and equal bilaterally.   EKG:  The EKG was personally reviewed and demonstrates: Sinus tachycardia, HR 103 with no acute ST  changes.    Labs/Studies     Relevant CV Studies:  Echocardiogram: 06/12/2019 IMPRESSIONS    1. Left ventricular ejection fraction, by visual estimation, is 55 to 60%. The left ventricle has normal function. There is moderately increased left ventricular hypertrophy.  2. Left ventricular diastolic parameters are consistent with Grade II diastolic dysfunction (pseudonormalization).  3. Global right ventricle has normal systolic function.The right ventricular size is normal. No increase in right ventricular wall thickness.  4. Left atrial size was normal.  5. Right atrial size was normal.  6. The mitral valve is grossly normal. Trace mitral valve regurgitation.  7. The tricuspid valve is grossly normal. Tricuspid valve regurgitation is trivial.  8. The aortic valve is tricuspid. Aortic valve regurgitation is not visualized.  9. The pulmonic valve was grossly normal. Pulmonic valve regurgitation is trivial. 10. Aortic dilatation noted. 11. There is mild dilatation of the ascending aorta. 12. TR signal is inadequate for assessing pulmonary artery systolic pressure. 13. The inferior vena cava is normal in size with greater than 50% respiratory variability, suggesting right atrial pressure of 3 mmHg.  Laboratory Data:  Chemistry Recent Labs  Lab 06/25/19 1733 06/26/19 0409  NA 138 138  K 3.8 4.8  CL 105 104  CO2 25 25  GLUCOSE 236* 199*  BUN 12 11  CREATININE 1.03 1.10  CALCIUM 8.4* 8.6*  GFRNONAA >60 >60  GFRAA >60 >60  ANIONGAP 8 9    Recent Labs  Lab 06/26/19 0409  PROT 6.2*  ALBUMIN 3.5  AST 17  ALT 26  ALKPHOS 56  BILITOT 1.6*   Hematology Recent Labs  Lab 06/25/19 1522 06/26/19 0409  WBC 10.3 8.2  RBC 4.80 4.61  HGB 15.1 14.4  HCT 43.2 41.4  MCV 90.0 89.8  MCH 31.5 31.2  MCHC 35.0 34.8  RDW 12.2 12.2  PLT 263 243   Cardiac EnzymesNo results for input(s): TROPONINI in the last 168 hours. No results for input(s): TROPIPOC in the last 168 hours.   BNPNo results for input(s): BNP, PROBNP in the last 168 hours.  DDimer  Recent Labs  Lab 06/25/19 1733  DDIMER 0.47    Radiology/Studies:  Dg Chest Portable 1 View  Result Date: 06/25/2019 CLINICAL DATA:  Radiograph 06/08/2019 EXAM: PORTABLE CHEST 1 VIEW COMPARISON:  None. FINDINGS: Low volumes and atelectasis. No consolidation, features of edema, pneumothorax, or effusion. The cardiomediastinal contours are unremarkable. Implantable loop recorder noted over the left chest wall. Sclerotic changes in the left humeral head may reflect sequela of prior bone infarct. Cervical fusion hardware incompletely assessed on this exam. Likely postsurgical changes of the distal left clavicle. No acute osseous or soft tissue abnormality. IMPRESSION: 1. Low lung volumes with atelectasis. No acute cardiopulmonary abnormality. 2. Implantable loop recorder over the left chest wall. Electronically Signed   By: Lovena Le M.D.   On: 06/25/2019 17:45     Assessment & Plan    1. Chest Pain/NSVT - He presented with recurrent episodes of chest pain, dyspnea and palpitations but reports his pain was more severe than it has been in the past. By review of telemetry while in the ED and by telemetry reports from his monitor, he has been having episodes of NSVT up to 20 beats. This was initially thought to be SVT with aberrancy during his last admission but his episodes of NSVT this admission have been occurring after being in normal sinus rhythm. EKG shows no acute changes and troponin values have been flat this admission. COVID results pending.  - Reviewed with Sean. Bronson Ing and will plan for cardiac catheterization for definitive evaluation. The patient understands that risks include but are not limited to stroke (1 in 1000), death (1 in 34), kidney failure [usually temporary] (1 in 500), bleeding (1 in 200), allergic reaction [possibly serious] (1 in 200).   - continue ASA (will reduce dosing to 81mg  daily), BB and  statin therapy.   2. SVT - As documented during his last admission and on his event monitor. Toprol-XL was recently titrated from 25 mg daily to 50 mg daily 3 days ago. Continue to follow on telemetry. He does have outpatient EP follow-up scheduled for next month but if he continues to have frequent episodes of this or NSVT during admission, would need to consider inpatient EP evaluation.  3. HTN - BP has been variable while in the ED but he was previously receiving IV Cardizem which has been discontinued. Continue PTA Toprol-XL 50mg  daily.   4. HLD - FLP shows total cholesterol 155, triglycerides 254, HDL 27 and LDL 77.  He has been intolerant to high intensity statin therapy in the past and remains on Atorvastatin 5 mg every other day. Will titrate to 5 mg daily.  5. IDDM - Hgb A1c pending. Metformin held at the time of admission. Would plan  to resume 48 hours following catheterization.    For questions or updates, please contact [facility] Please consult [email or web address] for contact info under Cardiology/STEMI.  Signed, Ellsworth Lennox, PA-Horn 06/26/2019, 10:28 AM Pager: [phone]  The patient was seen and examined, and I agree with the history, physical exam, assessment and plan as documented above, with modifications as noted below. I have also personally reviewed all relevant documentation, old records, labs, and both radiographic and cardiovascular studies. I have also independently interpreted old and new ECG's.  Briefly, this is a 58 year old male with a history of SVT, hypertension, hyperlipidemia, and insulin-dependent diabetes mellitus.  He was evaluated by cardiology earlier this month for palpitations and presyncope.  He had episodes of both narrow complex and wide-complex tachycardia which was concerning for ventricular tachycardia but EP felt it may have represented SVT with aberrancy.  He was placed on Toprol-XL at that time and arrange for an event monitor.  Event  monitoring is demonstrated numerous episodes of nonsustained ventricular tachycardia and beta-blocker was titrated.  Yesterday morning he woke up and after walking outdoors began to experience chest pain, palpitations and diaphoresis.  On 2 separate occasions yesterday he got up to stand up and felt like he was going to pass out.  He describes symptoms as a "chest pressure ".  Labs here demonstrate high-sensitivity troponins of 14 and 26.  Other labs were unremarkable.  ECG shows sinus tachycardia.  Telemetry in the ED demonstrates frequent paroxysms of narrow complex tachycardia consistent with SVT but he also has runs of nonsustained ventricular tachycardia lasting up to 20 beats.  Given frequent episodes of nonsustained ventricular tachycardia along with chest discomfort, will arrange for coronary angiography at [facility].  Continue aspirin, beta-blocker and statin.  He may require inpatient EP evaluation.   Prentice Docker, MD, [facility]  06/26/2019 10:53 AM

## 2019-06-26 NOTE — Progress Notes (Signed)
TR  BAND REMOVAL  LOCATION:    right radial  DEFLATED PER PROTOCOL:    Yes.    TIME BAND OFF / DRESSING APPLIED:    2020pm dressed with clean gauze and tegaderm    SITE UPON ARRIVAL:    Level 0  SITE AFTER BAND REMOVAL:    Level 0  CIRCULATION SENSATION AND MOVEMENT:    Within Normal Limits   Yes.    COMMENTS:   VS remain stable and care instructions given to patient

## 2019-06-26 NOTE — Progress Notes (Signed)
PROGRESS NOTE  Sean Horn ZOX:[id number] DOB: 09-May-1961 DOA: 06/25/2019 PCP: Pearline Cables, MD  Brief History:  58 year old male with a history of GERD, hypertension, diabetes mellitus, hyperlipidemia, SVT, and hypothyroidism presenting with palpitations, chest discomfort, shortness of breath.  The patient states that he has had palpitations and dizziness intermittently since his last hospital admission on 06/12/2019.  He followed up with cardiology, Dr. Servando Salina on 06/16/2019.  His metoprolol succinate was continued at 25 mg daily.  The patient was sent home with an event monitor through his last hospital admission.  Readings have shown that the patient continues to have intermittent NSVT.  The patient's metoprolol succinate was increased from 25 mg to 50 mg daily on 06/22/2019.  Around 11 AM on 06/25/2019, the patient had an episode of near syncope with chest discomfort, shortness of breath, and diaphoresis.  As result, he presented for further evaluation.  In the emergency department, the patient had an episode of SVT with diaphoresis and associated nausea and vomiting.  He was initially given metoprolol 5 mg IV x2 doses.  He was subsequently started on diltiazem drip which has been since discontinued.  Troponins are largely unremarkable 14>>>26.  Cardiology is consulted for further evaluation.  Assessment/Plan: SVT/Chest Pain -Continue metoprolol succinate 50 mg daily pending cardiology evaluation -TSH 1.714 -Personally reviewed chest x-ray--no edema or consolidation -Personally reviewed EKG--sinus rhythm, no ST-T wave changes -Cardiology consult -06/12/2019 Limited echo--EF 55 to 60%, G2DD, LVH  Uncontrolled diabetes mellitus type 2 with hyperglycemia -05/19/2019 hemoglobin A1c 8.7 -Patient receives Trulicity on Tuesdays -Holding Metformin -Start reduced dose Levemir -NovoLog sliding scale  Essential hypertension -Continue metoprolol succinate  Hyperlipidemia -LDL 77  -Continue Lipitor  Anxiety -Continue Lexapro  Hypothyroidism -Continue Synthroid      Disposition Plan:   Home when cleared by cardiology Family Communication:   No Family at bedside  Consultants:  cardiology  Code Status:  FULL   DVT Prophylaxis:  [place] Lovenox   Procedures: As Listed in Progress Note Above  Antibiotics: None       Subjective: Patient denies fevers, chills, headache, chest pain, dyspnea, nausea, vomiting, diarrhea, abdominal pain, dysuria, hematuria, hematochezia, and melena.  Objective: Vitals:   06/26/19 0621 06/26/19 0630 06/26/19 0700 06/26/19 0714  BP:  110/83    Pulse: 73 75 64 63  Resp: 19 18 17 17   Temp:      TempSrc:      SpO2: 100% 100% 95% 99%  Weight:      Height:       No intake or output data in the 24 hours ending 06/26/19 0732 Weight change:  Exam:   General:  Pt is alert, follows commands appropriately, not in acute distress  HEENT: No icterus, No thrush, No neck mass, [place]/AT  Cardiovascular: RRR, S1/S2, no rubs, no gallops  Respiratory: CTA bilaterally, no wheezing, no crackles, no rhonchi  Abdomen: Soft/+BS, non tender, non distended, no guarding  Extremities: No edema, No lymphangitis, No petechiae, No rashes, no synovitis   Data Reviewed: I have personally reviewed following labs and imaging studies Basic Metabolic Panel: Recent Labs  Lab 06/25/19 1733 06/26/19 0409  NA 138 138  K 3.8 4.8  CL 105 104  CO2 25 25  GLUCOSE 236* 199*  BUN 12 11  CREATININE 1.03 1.10  CALCIUM 8.4* 8.6*   Liver Function Tests: Recent Labs  Lab 06/26/19 0409  AST 17  ALT 26  ALKPHOS 56  BILITOT 1.6*  PROT 6.2*  ALBUMIN 3.5   No results for input(s): LIPASE, AMYLASE in the last 168 hours. No results for input(s): AMMONIA in the last 168 hours. Coagulation Profile: No results for input(s): INR, PROTIME in the last 168 hours. CBC: Recent Labs  Lab 06/25/19 1522 06/26/19 0409  WBC 10.3 8.2  HGB 15.1 14.4   HCT 43.2 41.4  MCV 90.0 89.8  PLT 263 243   Cardiac Enzymes: No results for input(s): CKTOTAL, CKMB, CKMBINDEX, TROPONINI in the last 168 hours. BNP: Invalid input(s): POCBNP CBG: No results for input(s): GLUCAP in the last 168 hours. HbA1C: No results for input(s): HGBA1C in the last 72 hours. Urine analysis:    Component Value Date/Time   COLORURINE YELLOW 11/03/2018 2115   APPEARANCEUR CLEAR 11/03/2018 2115   LABSPEC 1.033 (H) 11/03/2018 2115   PHURINE 5.0 11/03/2018 2115   GLUCOSEU >=500 (A) 11/03/2018 2115   GLUCOSEU >=1000 (A) 12/11/2016 1622   HGBUR NEGATIVE 11/03/2018 2115   BILIRUBINUR NEGATIVE 11/03/2018 2115   KETONESUR 5 (A) 11/03/2018 2115   PROTEINUR 30 (A) 11/03/2018 2115   UROBILINOGEN 0.2 12/11/2016 1622   NITRITE NEGATIVE 11/03/2018 2115   LEUKOCYTESUR NEGATIVE 11/03/2018 2115   Sepsis Labs: @LABRCNTIP (procalcitonin:4,lacticidven:4) )No results found for this or any previous visit (from the past 240 hour(s)).   Scheduled Meds: . aspirin EC  325 mg Oral Daily  . atorvastatin  5 mg Oral QODAY  . enoxaparin (LOVENOX) injection  40 mg Subcutaneous Q24H  . escitalopram  20 mg Oral Daily  . levothyroxine  25 mcg Oral QAC breakfast  . loratadine  10 mg Oral q morning - 10a  . metoprolol succinate  50 mg Oral Daily  . pantoprazole  40 mg Oral Daily  . sodium chloride flush  3 mL Intravenous Q12H  . topiramate  50 mg Oral QHS   Continuous Infusions: . sodium chloride    . sodium chloride 100 mL/hr at 06/26/19 0237  . diltiazem (CARDIZEM) infusion Stopped (06/26/19 0518)    Procedures/Studies: Dg Shoulder Right  Result Date: 05/29/2019 Right shoulder 2 views AP lateral Glenohumeral joint looks good but there is a subtle proximal migration of the humerus with greater tuberosity sclerosis type I acromion AC joint looks clean acromion seems to have some bony overgrowth Impression normal glenohumeral joint with slight proximal migration of the humerus  suggestive of chronic rotator cuff tear  Mr Shoulder Right Wo Contrast  Result Date: 06/09/2019 CLINICAL DATA:  Right shoulder pain for 7 months.  No known injury. EXAM: MRI OF THE RIGHT SHOULDER WITHOUT CONTRAST TECHNIQUE: Multiplanar, multisequence MR imaging of the shoulder was performed. No intravenous contrast was administered. COMPARISON:  None. FINDINGS: Rotator cuff: Severe tendinosis of the supraspinatus tendon with a small partial-thickness articular surface tear. Infraspinatus tendon is intact. Teres minor tendon is intact. Subscapularis tendon is intact. Muscles: No atrophy or fatty replacement of nor abnormal signal within, the muscles of the rotator cuff. Biceps long head: Moderate tendinosis of the intra-articular portion of the long head of the biceps tendon. Acromioclavicular Joint: Moderate arthropathy of the acromioclavicular joint. Type I acromion. No subacromial/subdeltoid bursal fluid. Glenohumeral Joint: No joint effusion. No chondral defect. Labrum: Grossly intact, but evaluation is limited by lack of intraarticular fluid. Bones:  No acute osseous abnormality.  No aggressive osseous lesion. Other: No fluid collection or hematoma. IMPRESSION: 1. Severe tendinosis of the supraspinatus tendon with a small partial-thickness articular surface tear. 2. Moderate tendinosis of the intra-articular portion of  the long head of the biceps tendon. Electronically Signed   By: Kathreen Devoid   On: 06/09/2019 08:30   Dg Chest Portable 1 View  Result Date: 06/25/2019 CLINICAL DATA:  Radiograph 06/08/2019 EXAM: PORTABLE CHEST 1 VIEW COMPARISON:  None. FINDINGS: Low volumes and atelectasis. No consolidation, features of edema, pneumothorax, or effusion. The cardiomediastinal contours are unremarkable. Implantable loop recorder noted over the left chest wall. Sclerotic changes in the left humeral head may reflect sequela of prior bone infarct. Cervical fusion hardware incompletely assessed on this exam.  Likely postsurgical changes of the distal left clavicle. No acute osseous or soft tissue abnormality. IMPRESSION: 1. Low lung volumes with atelectasis. No acute cardiopulmonary abnormality. 2. Implantable loop recorder over the left chest wall. Electronically Signed   By: Lovena Le M.D.   On: 06/25/2019 17:45   Dg Chest Port 1 View  Result Date: 06/08/2019 CLINICAL DATA:  Chest pain, syncopal episode EXAM: PORTABLE CHEST 1 VIEW COMPARISON:  12/16/2012 FINDINGS: The heart size and mediastinal contours are within normal limits. Both lungs are clear. The visualized skeletal structures are unremarkable. IMPRESSION: No active disease. Electronically Signed   By: Kathreen Devoid   On: 06/08/2019 13:08    Orson Eva, DO  [facility] Pager [phone]  If 7PM-7AM, please contact night-coverage [email or web address] Password [email or web address] 06/26/2019, 7:32 AM   LOS: 0 days

## 2019-06-26 NOTE — Telephone Encounter (Signed)
Called by [facility] this morning about abnormal EKG findings. Monitored captured 6s run of VT as well as longer run of SVT. Per documentation, patient is current being evaluated in the ER for chest pain and palpitations. This information was relayed to the cardiology team at [facility]. Will make ordering provider aware.   Lauren K. Marletta Lor, MD

## 2019-06-26 NOTE — Progress Notes (Signed)
PHARMACIST - PHYSICIAN ORDER COMMUNICATION  CONCERNING: P&T Medication Policy on Herbal Medications  DESCRIPTION:  This patient's order for:  Co Q-10 capsules  has been noted.  This product(s) is classified as an "herbal" or natural product. Due to a lack of definitive safety studies or FDA approval, nonstandard manufacturing practices, plus the potential risk of unknown drug-drug interactions while on inpatient medications, the Pharmacy and Therapeutics Committee does not permit the use of "herbal" or natural products of this type within [place].   ACTION TAKEN: The pharmacy department is unable to verify this order at this time and your patient has been informed of this safety policy. Please reevaluate patient's clinical condition at discharge and address if the herbal or natural product(s) should be resumed at that time.   

## 2019-06-27 DIAGNOSIS — R000 Tachycardia, unspecified: Secondary | ICD-10-CM

## 2019-06-27 LAB — CBC
HCT: 37.2 % — ABNORMAL LOW (ref 39.0–52.0)
Hemoglobin: 13.7 g/dL (ref 13.0–17.0)
MCH: 31.9 pg (ref 26.0–34.0)
MCHC: 36.8 g/dL — ABNORMAL HIGH (ref 30.0–36.0)
MCV: 86.7 fL (ref 80.0–100.0)
Platelets: 225 10*3/uL (ref 150–400)
RBC: 4.29 MIL/uL (ref 4.22–5.81)
RDW: 11.9 % (ref 11.5–15.5)
WBC: 6.6 10*3/uL (ref 4.0–10.5)
nRBC: 0 % (ref 0.0–0.2)

## 2019-06-27 LAB — GLUCOSE, CAPILLARY
Glucose-Capillary: 150 mg/dL — ABNORMAL HIGH (ref 70–99)
Glucose-Capillary: 153 mg/dL — ABNORMAL HIGH (ref 70–99)
Glucose-Capillary: 162 mg/dL — ABNORMAL HIGH (ref 70–99)
Glucose-Capillary: 195 mg/dL — ABNORMAL HIGH (ref 70–99)
Glucose-Capillary: 199 mg/dL — ABNORMAL HIGH (ref 70–99)

## 2019-06-27 LAB — HEPARIN LEVEL (UNFRACTIONATED): Heparin Unfractionated: 0.3 IU/mL (ref 0.30–0.70)

## 2019-06-27 MED ORDER — ISOSORBIDE MONONITRATE ER 30 MG PO TB24
30.0000 mg | ORAL_TABLET | Freq: Every day | ORAL | Status: DC
  Administered 2019-06-27 – 2019-06-28 (×2): 30 mg via ORAL
  Filled 2019-06-27 (×2): qty 1

## 2019-06-27 NOTE — Progress Notes (Signed)
Subjective:  Denies SSCP, palpitations or Dyspnea  Objective:  Vitals:   06/26/19 2152 06/27/19 0004 06/27/19 0539 06/27/19 0818  BP:  140/90 122/84 131/81  Pulse:  73 71 73  Resp:  16 16 18   Temp:   97.6 F (36.4 C) 97.6 F (36.4 C)  TempSrc:   Oral Oral  SpO2:   97% 97%  Weight: 98.9 kg  98.4 kg   Height: 6' (1.829 m)       Intake/Output from previous day:  Intake/Output Summary (Last 24 hours) at 06/27/2019 0904 Last data filed at 06/27/2019 06/29/2019 Gross per 24 hour  Intake 1418.01 ml  Output 1525 ml  Net -106.99 ml    Physical Exam: Affect appropriate Healthy:  appears stated age HEENT: normal Neck supple with no adenopathy JVP normal no bruits no thyromegaly Lungs clear with no wheezing and good diaphragmatic motion Heart:  S1/S2 no murmur, no rub, gallop or click PMI normal Abdomen: benighn, BS positve, no tenderness, no AAA no bruit.  No HSM or HJR Distal pulses intact with no bruits No edema Neuro non-focal Skin warm and dry No muscular weakness Right radial A no hematoma   Lab Results: Basic Metabolic Panel: Recent Labs    06/25/19 1733 06/26/19 0409  NA 138 138  K 3.8 4.8  CL 105 104  CO2 25 25  GLUCOSE 236* 199*  BUN 12 11  CREATININE 1.03 1.10  CALCIUM 8.4* 8.6*   Liver Function Tests: Recent Labs    06/26/19 0409  AST 17  ALT 26  ALKPHOS 56  BILITOT 1.6*  PROT 6.2*  ALBUMIN 3.5   No results for input(s): LIPASE, AMYLASE in the last 72 hours. CBC: Recent Labs    06/26/19 0409 06/27/19 0300  WBC 8.2 6.6  HGB 14.4 13.7  HCT 41.4 37.2*  MCV 89.8 86.7  PLT 243 225   Cardiac Enzymes: No results for input(s): CKTOTAL, CKMB, CKMBINDEX, TROPONINI in the last 72 hours. BNP: Invalid input(s): POCBNP D-Dimer: Recent Labs    06/25/19 1733  DDIMER 0.47   Hemoglobin A1C: Recent Labs    06/26/19 0409  HGBA1C 8.0*   Fasting Lipid Panel: Recent Labs    06/26/19 0409  CHOL 155  HDL 27*  LDLCALC 77  TRIG 06/28/19*   CHOLHDL 5.7   Thyroid Function Tests: Recent Labs    06/25/19 1733  TSH 1.714   Anemia Panel: No results for input(s): VITAMINB12, FOLATE, FERRITIN, TIBC, IRON, RETICCTPCT in the last 72 hours.  Imaging: Dg Chest Portable 1 View  Result Date: 06/25/2019 CLINICAL DATA:  Radiograph 06/08/2019 EXAM: PORTABLE CHEST 1 VIEW COMPARISON:  None. FINDINGS: Low volumes and atelectasis. No consolidation, features of edema, pneumothorax, or effusion. The cardiomediastinal contours are unremarkable. Implantable loop recorder noted over the left chest wall. Sclerotic changes in the left humeral head may reflect sequela of prior bone infarct. Cervical fusion hardware incompletely assessed on this exam. Likely postsurgical changes of the distal left clavicle. No acute osseous or soft tissue abnormality. IMPRESSION: 1. Low lung volumes with atelectasis. No acute cardiopulmonary abnormality. 2. Implantable loop recorder over the left chest wall. Electronically Signed   By: 13/09/2018 M.D.   On: 06/25/2019 17:45    Cardiac Studies:  ECG:    Telemetry:  NSR occasional PAC;s   Echo: 06/12/19  EF 55-60% no RWMA;s   Medications:   . aspirin EC  81 mg Oral Daily  . atorvastatin  5 mg Oral q1800  .  clopidogrel  75 mg Oral Q breakfast  . escitalopram  20 mg Oral Daily  . insulin aspart  0-15 Units Subcutaneous TID WC  . insulin aspart  0-5 Units Subcutaneous QHS  . insulin detemir  30 Units Subcutaneous QHS  . levothyroxine  25 mcg Oral QAC breakfast  . loratadine  10 mg Oral q morning - 10a  . metoprolol succinate  50 mg Oral Daily  . pantoprazole  40 mg Oral Daily  . sodium chloride flush  3 mL Intravenous Q12H  . sodium chloride flush  3 mL Intravenous Q12H  . topiramate  50 mg Oral QHS     . sodium chloride    . sodium chloride    . diltiazem (CARDIZEM) infusion 5 mg/hr (06/26/19 1857)  . heparin 1,350 Units/hr (06/27/19 0307)    Assessment/Plan:   1. CAD:  Diffuse distal LAD dx with  spasm not amenable to PCI/Stent DAT for at least 6 months statin and beta blocker will add long acting nitrates and dc iv nitro Should be ready for d/c in am   Jenkins Rouge 06/27/2019, 9:04 AM

## 2019-06-28 ENCOUNTER — Encounter (HOSPITAL_COMMUNITY): Payer: Self-pay | Admitting: Physician Assistant

## 2019-06-28 DIAGNOSIS — I214 Non-ST elevation (NSTEMI) myocardial infarction: Principal | ICD-10-CM

## 2019-06-28 DIAGNOSIS — I471 Supraventricular tachycardia: Secondary | ICD-10-CM

## 2019-06-28 LAB — GLUCOSE, CAPILLARY: Glucose-Capillary: 165 mg/dL — ABNORMAL HIGH (ref 70–99)

## 2019-06-28 MED ORDER — ASPIRIN 81 MG PO TBEC: 81.0000 mg | DELAYED_RELEASE_TABLET | Freq: Every day | ORAL | 3 refills | Status: DC

## 2019-06-28 MED ORDER — ISOSORBIDE MONONITRATE ER 30 MG PO TB24: 30.0000 mg | ORAL_TABLET | Freq: Every day | ORAL | 6 refills | Status: DC

## 2019-06-28 MED ORDER — CLOPIDOGREL BISULFATE 75 MG PO TABS: 75.0000 mg | ORAL_TABLET | Freq: Every day | ORAL | 6 refills | Status: DC

## 2019-06-28 MED ORDER — NITROGLYCERIN 0.4 MG SL SUBL: 0.4000 mg | SUBLINGUAL_TABLET | SUBLINGUAL | 12 refills | Status: DC | PRN

## 2019-06-28 NOTE — Discharge Summary (Addendum)
Discharge Summary    Patient ID: Sean Horn MRN: [id number]; DOB: 02-19-1961  Admit date: 06/25/2019 Discharge date: 06/28/2019  Primary Care Provider: Pearline Cables, MD  Primary Cardiologist: Thomasene Ripple, DO [facility]) Primary Electrophysiologist: Will establish care with Dr. Ladona Ridgel  Discharge Diagnoses    Principal Problem:   Tachycardia Active Problems:   Type 2 diabetes mellitus with complication, with long-term current use of insulin (HCC)   Hypertension   Hyperlipidemia   SVT (supraventricular tachycardia) (HCC)   Non-ST elevation (NSTEMI) myocardial infarction [facility])  CAD  Diagnostic Studies/Procedures    LEFT HEART CATH AND CORONARY ANGIOGRAPHY  Conclusion    Dist (apical) LAD-1 lesion is 80% stenosed. This is followed by Dist LAD-2 lesion is 55% stenosed. -Not favorable for PCI given near apical nature and small caliber.  There is mild left ventricular systolic dysfunction. The left ventricular ejection fraction is 50-55% by visual estimate.  LV end diastolic pressure is normal  The left ventricular ejection fraction is 50-55% by visual estimate.   SUMMARY  Single-vessel CAD with diffuse spasm of the apical LAD with a focal lesion of roughly 80% near the apex followed by an extensive diffuse 50 to 55% stenosis around the apex as poor runoff) -> not a good candidate for PCI based on the apical nature and small caliber vessel at this point. ? Likely culprit lesion is the apical LAD 80% stenosis. ->  Best treated medically.  Mid apical LAD spasm somewhat alleviated with nitroglycerin IC.Marland Kitchen  Low normal to mildly reduced LVEF with anterior apical and apical hypokinesis and high normal LVEDP   RECOMMENDATIONS  Admit to telemetry, will start IV heparin 8 hours after sheath removal.  Add amlodipine for blood pressure control due to spasm  Will use IV nitroglycerin for blood pressure control, and switch to Imdur on discharge based on spasm   Would recommend at least 3 to 6 months of clopidogrel given ACS presentation.   Diagnostic Dominance: Right     History of Present Illness     Sean Horn is a 58 y.o. male with past medical history of SVT, HTN, HLD, hypothyrodism and IDDM transferred to [facility] for cath.   He evaluated by the cardiology service on 06/12/2019 during an ED evaluation for palpitations and presyncope. Troponin values peaked at 142 but EKG showed no acute ischemic changes. He had episodes of narrow complex tachycardia during admission but did have a 36 beat of a wide-complex tachycardia which was concerning for VT or possible SVT with aberrancy. This was reviewed with the EP and thought to be most consistent with SVT with aberrancy and he was transitioned from Coreg to Toprol-XL with placement of a monitor. He did follow-up with Dr. Servando Salina as an outpatient and it was recommended he have a Coronary CT for ischemic evaluation. Event monitor has shown episodes of NSVT. Placed on BB and has follow up with Dr. Ladona Ridgel on 07/14/2019.  Echo 06/12/2019 showed LVEF of 55-60%  He presented to [facility] ED 06/25/2019 afternoon for evaluation of dyspnea, chest pain and diaphoresis. He reports having intermittent episodes of palpitations despite on BB. Chest pain occurs with and without palpitations.   Hospital Course     Consultants: IM as attending at [facility]  He was admitted at [facility] for further evaluation. EKG without acute ischemic changes. Hs- troponin peaked at 142. Treated with heparin. Telemetry showed frequent paroxysms of narrow complex tachycardia consistent with SVT but he also has runs of nonsustained ventricular tachycardia  lasting up to 20 beats. Given frequent episodes of nonsustained ventricular tachycardia along with chest discomfort he was transferred to [facility] for cath.    1. CAD Cath showed single-vessel CAD with diffuse spasm of the apical LAD with a focal lesion of roughly 80% near the apex followed by an  extensive diffuse 50 to 55% stenosis around the apex as poor runoff) -> not a good candidate for PCI based on the apical nature and small caliber vessel at this point. Spasm improved with IV nitro. Recommended DAPT with ASA and Plavix for at least 3 to 6 months. Heparin discontinued and placed on Imdur. No recurrent chest pain. Ambulated well. Continue BB and statin.   2. HLD - 06/26/2019: Cholesterol 155; HDL 27; LDL Cholesterol 77; Triglycerides 254; VLDL 51  - LDL goal less than 70. Continue low dose lipitor>> consider outpatient titration. Intolerance to Rosuvastatin.   3. SVT and NSVT - Telemetry showed frequent paroxysms of narrow complex tachycardia consistent with SVT but he also has runs of nonsustained ventricular tachycardia lasting up to 20 beats at [facility] - TSH normal - He had 11 beats of narrow complex tachycardia overnight.  - He feels intermittent palpitations while here but now his chest pain is gone.  - Electrolytes normal - Continue Toprol XL  qd  4. HTN - BP stable on current medications  5. Hypothyroidism - TSH normal. Continue home Euthyroid qd  6. DM - resume home medications. HgbA1c 8.0    Did the patient have an acute coronary syndrome (MI, NSTEMI, STEMI, etc) this admission?:  Yes                               AHA/ACC Clinical Performance & Quality Measures: 1. Aspirin prescribed? - Yes 2. ADP Receptor Inhibitor (Plavix/Clopidogrel, Brilinta/Ticagrelor or Effient/Prasugrel) prescribed (includes medically managed patients)? - Yes 3. Beta Blocker prescribed? - Yes 4. High Intensity Statin (Lipitor 40-80mg  or Crestor 20-40mg ) prescribed? - No - statin intolerance 5. EF assessed during THIS hospitalization? - No - had recent echo 6. For EF <40%, was ACEI/ARB prescribed? - Not Applicable (EF >/= 40%) 7. For EF <40%, Aldosterone Antagonist (Spironolactone or Eplerenone) prescribed? - Not Applicable (EF >/= 40%) 8. Cardiac Rehab Phase II ordered (Included  Medically managed Patients)? - No - NO PCI   _____________  Discharge Vitals Blood pressure (!) 140/91, pulse 77, temperature 98.1 F (36.7 C), temperature source Oral, resp. rate 16, height 6' (1.829 m), weight 96.8 kg, SpO2 98 %.  Filed Weights   06/26/19 2152 06/27/19 0539 06/28/19 0432  Weight: 98.9 kg 98.4 kg 96.8 kg    Labs & Radiologic Studies    CBC Recent Labs    06/26/19 0409 06/27/19 0300  WBC 8.2 6.6  HGB 14.4 13.7  HCT 41.4 37.2*  MCV 89.8 86.7  PLT 243 225   Basic Metabolic Panel Recent Labs    16/10/96 1733 06/26/19 0409  NA 138 138  K 3.8 4.8  CL 105 104  CO2 25 25  GLUCOSE 236* 199*  BUN 12 11  CREATININE 1.03 1.10  CALCIUM 8.4* 8.6*   Liver Function Tests Recent Labs    06/26/19 0409  AST 17  ALT 26  ALKPHOS 56  BILITOT 1.6*  PROT 6.2*  ALBUMIN 3.5   High Sensitivity Troponin:   Recent Labs  Lab 06/12/19 0523 06/12/19 0946 06/12/19 1150 06/25/19 1733 06/25/19 2112  TROPONINIHS 63* 142* 102*  14 26*   D-Dimer Recent Labs    06/25/19 1733  DDIMER 0.47   Hemoglobin A1C Recent Labs    06/26/19 0409  HGBA1C 8.0*   Fasting Lipid Panel Recent Labs    06/26/19 0409  CHOL 155  HDL 27*  LDLCALC 77  TRIG 254*  CHOLHDL 5.7   Thyroid Function Tests Recent Labs    06/25/19 1733  TSH 1.714   _____________  Dg Shoulder Right  Result Date: 05/29/2019 Right shoulder 2 views AP lateral Glenohumeral joint looks good but there is a subtle proximal migration of the humerus with greater tuberosity sclerosis type I acromion AC joint looks clean acromion seems to have some bony overgrowth Impression normal glenohumeral joint with slight proximal migration of the humerus suggestive of chronic rotator cuff tear  Mr Shoulder Right Wo Contrast  Result Date: 06/09/2019 CLINICAL DATA:  Right shoulder pain for 7 months.  No known injury. EXAM: MRI OF THE RIGHT SHOULDER WITHOUT CONTRAST TECHNIQUE: Multiplanar, multisequence MR imaging of  the shoulder was performed. No intravenous contrast was administered. COMPARISON:  None. FINDINGS: Rotator cuff: Severe tendinosis of the supraspinatus tendon with a small partial-thickness articular surface tear. Infraspinatus tendon is intact. Teres minor tendon is intact. Subscapularis tendon is intact. Muscles: No atrophy or fatty replacement of nor abnormal signal within, the muscles of the rotator cuff. Biceps long head: Moderate tendinosis of the intra-articular portion of the long head of the biceps tendon. Acromioclavicular Joint: Moderate arthropathy of the acromioclavicular joint. Type I acromion. No subacromial/subdeltoid bursal fluid. Glenohumeral Joint: No joint effusion. No chondral defect. Labrum: Grossly intact, but evaluation is limited by lack of intraarticular fluid. Bones:  No acute osseous abnormality.  No aggressive osseous lesion. Other: No fluid collection or hematoma. IMPRESSION: 1. Severe tendinosis of the supraspinatus tendon with a small partial-thickness articular surface tear. 2. Moderate tendinosis of the intra-articular portion of the long head of the biceps tendon. Electronically Signed   By: Kathreen Devoid   On: 06/09/2019 08:30   Dg Chest Portable 1 View  Result Date: 06/25/2019 CLINICAL DATA:  Radiograph 06/08/2019 EXAM: PORTABLE CHEST 1 VIEW COMPARISON:  None. FINDINGS: Low volumes and atelectasis. No consolidation, features of edema, pneumothorax, or effusion. The cardiomediastinal contours are unremarkable. Implantable loop recorder noted over the left chest wall. Sclerotic changes in the left humeral head may reflect sequela of prior bone infarct. Cervical fusion hardware incompletely assessed on this exam. Likely postsurgical changes of the distal left clavicle. No acute osseous or soft tissue abnormality. IMPRESSION: 1. Low lung volumes with atelectasis. No acute cardiopulmonary abnormality. 2. Implantable loop recorder over the left chest wall. Electronically Signed    By: Lovena Le M.D.   On: 06/25/2019 17:45   Dg Chest Port 1 View  Result Date: 06/08/2019 CLINICAL DATA:  Chest pain, syncopal episode EXAM: PORTABLE CHEST 1 VIEW COMPARISON:  12/16/2012 FINDINGS: The heart size and mediastinal contours are within normal limits. Both lungs are clear. The visualized skeletal structures are unremarkable. IMPRESSION: No active disease. Electronically Signed   By: Kathreen Devoid   On: 06/08/2019 13:08   Disposition   Pt is being discharged home today in good condition.  Follow-up Plans & Appointments    Follow-up Information    Evans Lance, MD. Go on 07/14/2019.   Specialty: Cardiology Why: @8 :45am for SVT discussion  Contact information: [address] [place] [address] [phone]          Discharge Instructions  Diet - low sodium heart healthy   Complete by: As directed    Discharge instructions   Complete by: As directed    No driving for 48 hours. No lifting over 5 lbs for 1 week. No sexual activity for 1 week. You may return to work on 07/06/2019. Keep procedure site clean & dry. If you notice increased pain, swelling, bleeding or pus, call/return!  You may shower, but no soaking baths/hot tubs/pools for 1 week.  Hold metformin today>> resume tomorrow 06/29/2019.   Increase activity slowly   Complete by: As directed       Discharge Medications   Allergies as of 06/28/2019      Reactions   Rosuvastatin Other (See Comments)   Toradol [ketorolac Tromethamine] Itching      Medication List    STOP taking these medications   naproxen 250 MG tablet Commonly known as: NAPROSYN     TAKE these medications   aspirin 81 MG EC tablet Take 1 tablet (81 mg total) by mouth daily.   atorvastatin 10 MG tablet Commonly known as: LIPITOR Take 0.5 tablets (5 mg total) by mouth every other day.   clopidogrel 75 MG tablet Commonly known as: PLAVIX Take 1 tablet (75 mg total) by mouth daily with breakfast.   CoQ-10 75 MG Caps  Take 75 mg by mouth daily.   escitalopram 20 MG tablet Commonly known as: LEXAPRO Take 1 tablet by mouth once daily   Euthyrox 25 MCG tablet Generic drug: levothyroxine TAKE 1 TABLET BY MOUTH ONCE DAILY BEFORE BREAKFAST What changed: See the new instructions.   insulin aspart 100 UNIT/ML FlexPen Commonly known as: NovoLOG FlexPen Inject 25-35 units 3 times daily with meals What changed:   how much to take  how to take this  when to take this  additional instructions   insulin detemir 100 UNIT/ML injection Commonly known as: Levemir Inject 0.75 mLs (75 Units total) into the skin at bedtime. What changed: how much to take   isosorbide mononitrate 30 MG 24 hr tablet Commonly known as: IMDUR Take 1 tablet (30 mg total) by mouth daily.   lansoprazole 30 MG capsule Commonly known as: PREVACID Take 30 mg by mouth every morning.   loratadine 10 MG tablet Commonly known as: CLARITIN Take 10 mg by mouth every morning.   metFORMIN 500 MG 24 hr tablet Commonly known as: GLUCOPHAGE-XR Take 1 tablet (500 mg total) by mouth 2 (two) times daily with a meal.   metoprolol succinate 50 MG 24 hr tablet Commonly known as: TOPROL-XL Take 1 tablet (50 mg total) by mouth daily. Take with or immediately following a meal.   nitroGLYCERIN 0.4 MG SL tablet Commonly known as: Nitrostat Place 1 tablet (0.4 mg total) under the tongue every 5 (five) minutes as needed.   sildenafil 20 MG tablet Commonly known as: REVATIO Take 20 mg by mouth daily as needed.   topiramate 50 MG tablet Commonly known as: Topamax Take 1 tablet (50 mg total) by mouth at bedtime.   Trulicity 1.5 MG/0.5ML Sopn Generic drug: Dulaglutide Inject 1.5 mg into the skin once a week. NEED APPOINTMENT FOR FURTHER REFILLS What changed: additional instructions   Voltaren 1 % Gel Generic drug: diclofenac Sodium Apply 2 g topically Nightly.          Outstanding Labs/Studies   None  Duration of Discharge  Encounter   Greater than 30 minutes including physician time.  Lorelei PontSigned, Whitney Hillegass, PA 06/28/2019, 8:05 AM

## 2019-06-29 ENCOUNTER — Encounter (HOSPITAL_COMMUNITY): Payer: Self-pay | Admitting: Cardiology

## 2019-07-06 ENCOUNTER — Telehealth: Payer: Self-pay

## 2019-07-06 NOTE — Telephone Encounter (Signed)
Trulicity 3 MG not covered by insurance.  Please advise.

## 2019-07-07 NOTE — Telephone Encounter (Signed)
This is very unusual.  He was on the 1.5 mg weekly.  Let us start a PA.

## 2019-07-07 NOTE — Telephone Encounter (Signed)
PA submitted via [employer] as they are the ones who notified us the medication is not covered and started the PA request.  However this was the response:  Outcome Additional Information Required The patient currently has access to the requested medication and a Prior Authorization is not needed for the patient/medication.

## 2019-07-09 ENCOUNTER — Ambulatory Visit: Payer: Medicare HMO | Admitting: Cardiology

## 2019-07-14 ENCOUNTER — Encounter: Payer: Self-pay | Admitting: Internal Medicine

## 2019-07-14 ENCOUNTER — Encounter: Payer: Self-pay | Admitting: *Deleted

## 2019-07-14 ENCOUNTER — Ambulatory Visit: Payer: Medicare HMO | Admitting: Internal Medicine

## 2019-07-14 ENCOUNTER — Other Ambulatory Visit: Payer: Self-pay

## 2019-07-14 VITALS — BP 130/85 | HR 85 | Temp 97.3°F | Ht 72.0 in | Wt 221.0 lb

## 2019-07-14 DIAGNOSIS — I471 Supraventricular tachycardia: Secondary | ICD-10-CM

## 2019-07-14 DIAGNOSIS — Z01818 Encounter for other preprocedural examination: Secondary | ICD-10-CM

## 2019-07-14 NOTE — H&P (View-Only) (Signed)
HPI Sean Horn is referred today by Bernerd Pho for evaluation of a wide and narrow QRS tachycardia. He states that his symptoms are paroxysmal. He has had documented tachycardia at rates of 180/min. In SVT his wide QRS rate is the same as the narrow QRS tachycardia. He has not had syncope but gets sob and has chest pressure in SVT. The episodes start and stop suddenly. He was not certain as to whether he had ever received adenosine.  Allergies  Allergen Reactions  . Rosuvastatin Other (See Comments)  . Toradol [Ketorolac Tromethamine] Itching     Current Outpatient Medications  Medication Sig Dispense Refill  . aspirin EC 81 MG EC tablet Take 1 tablet (81 mg total) by mouth daily. 90 tablet 3  . atorvastatin (LIPITOR) 10 MG tablet Take 0.5 tablets (5 mg total) by mouth every other day. 45 tablet 0  . clopidogrel (PLAVIX) 75 MG tablet Take 1 tablet (75 mg total) by mouth daily with breakfast. 30 tablet 6  . Coenzyme Q10 (COQ-10) 75 MG CAPS Take 75 mg by mouth daily.  0  . diclofenac Sodium (VOLTAREN) 1 % GEL Apply 2 g topically Nightly.    . Dulaglutide (TRULICITY) 1.5 DX/8.3JA SOPN Inject 1.5 mg into the skin once a week. NEED APPOINTMENT FOR FURTHER REFILLS (Patient taking differently: Inject 1.5 mg into the skin once a week. ) 12 mL 0  . escitalopram (LEXAPRO) 20 MG tablet Take 1 tablet by mouth once daily (Patient taking differently: Take 20 mg by mouth daily. ) 90 tablet 0  . EUTHYROX 25 MCG tablet TAKE 1 TABLET BY MOUTH ONCE DAILY BEFORE BREAKFAST (Patient taking differently: Take 25 mcg by mouth daily before breakfast. ) 90 tablet 0  . insulin aspart (NOVOLOG FLEXPEN) 100 UNIT/ML FlexPen Inject 25-35 units 3 times daily with meals (Patient taking differently: Inject 10 Units into the skin 3 (three) times daily with meals. ) 30 mL 5  . insulin detemir (LEVEMIR) 100 UNIT/ML injection Inject 0.75 mLs (75 Units total) into the skin at bedtime. (Patient taking differently: Inject  60 Units into the skin at bedtime. ) 80 mL 5  . isosorbide mononitrate (IMDUR) 30 MG 24 hr tablet Take 1 tablet (30 mg total) by mouth daily. 30 tablet 6  . lansoprazole (PREVACID) 30 MG capsule Take 30 mg by mouth every morning.     . loratadine (CLARITIN) 10 MG tablet Take 10 mg by mouth every morning.     . metFORMIN (GLUCOPHAGE-XR) 500 MG 24 hr tablet Take 1 tablet (500 mg total) by mouth 2 (two) times daily with a meal. 60 tablet 11  . metoprolol succinate (TOPROL-XL) 50 MG 24 hr tablet Take 1 tablet (50 mg total) by mouth daily. Take with or immediately following a meal. 90 tablet 3  . nitroGLYCERIN (NITROSTAT) 0.4 MG SL tablet Place 1 tablet (0.4 mg total) under the tongue every 5 (five) minutes as needed. 25 tablet 12  . sildenafil (REVATIO) 20 MG tablet Take 20 mg by mouth daily as needed.     . topiramate (TOPAMAX) 50 MG tablet Take 1 tablet (50 mg total) by mouth at bedtime. 30 tablet 6   No current facility-administered medications for this visit.      Past Medical History:  Diagnosis Date  . Anxiety   . CAD in native artery    a. cath 06/26/2019 - 80% apical LAD >> medical management   . Chronic headaches   . Chronic neck  pain   . GERD (gastroesophageal reflux disease)    occasional - OTC as needed  . History of chicken pox   . History of kidney stones   . History of shingles   . Hypertension    under control with med., had been on med. since age 19  . Insomnia 09/13/2016  . Insulin dependent diabetes mellitus    Follows with endocrinology, Dr Clark reports his last hgba1c was 7.3   . Non-insulin dependent type 2 diabetes mellitus (HCC)   . Obesity 01/30/2017  . Occipital neuralgia   . Osteoarthritis    right knee  . PONV (postoperative nausea and vomiting)   . PONV (postoperative nausea and vomiting)   . Preventative health care 08/14/2013  . Tachycardia   . Urinary frequency 12/11/2016    ROS:   All systems reviewed and negative except as noted in the HPI.    Past Surgical History:  Procedure Laterality Date  . ANTERIOR CERVICAL DECOMP/DISCECTOMY FUSION N/A 01/16/2018   Procedure: ANTERIOR CERVICAL DECOMPRESSION/DISCECTOMY FUSION CERVICAL FOUR-FIVE, CERVICAL FIVE-SIX;  Surgeon: Nundkumar, Neelesh, MD;  Location: [facility] OR;  Service: Neurosurgery;  Laterality: N/A;  ANTERIOR CERVICAL DECOMPRESSION/DISCECTOMY FUSION CERVICAL FOUR-FIVE, CERVICAL FIVE-SIX  . CHOLECYSTECTOMY  1980's  . CYSTOSCOPY/RETROGRADE/URETEROSCOPY/STONE EXTRACTION WITH BASKET Right 08/27/2003   and double J stent placement  . INGUINAL HERNIA REPAIR Left 1990's  . KNEE ARTHROSCOPY Left 1980's  . KNEE ARTHROSCOPY Right 2013  . KNEE ARTHROSCOPY Right 06/30/2013   Procedure: RIGHT KNEE ARTHROSCOPY AND SYNOVECTOMY;  Surgeon: Peter G Dalldorf, MD;  Location: [facility];  Service: Orthopedics;  Laterality: Right;  . LEFT HEART CATH AND CORONARY ANGIOGRAPHY N/A 06/26/2019   Procedure: LEFT HEART CATH AND CORONARY ANGIOGRAPHY;  Surgeon: Harding, David W, MD;  Location: [facility] INVASIVE CV LAB;  Service: Cardiovascular;  Laterality: N/A;  . LITHOTRIPSY     multiple, b/l  . SHOULDER ADHESION RELEASE Left ` 1998  . SHOULDER ARTHROSCOPY W/ ROTATOR CUFF REPAIR Left 10/20/2004   x 4, had to have part of clavile removed. torn rotator cuff multiple times, had to place screws   . SHOULDER ARTHROSCOPY W/ SUPERIOR LABRAL ANTERIOR POSTERIOR LESION REPAIR Left 12/22/2004  . TOTAL KNEE ARTHROPLASTY Right 12/23/2012   x3, 2 arthroscopies and a TKR,  TOTAL KNEE ARTHROPLASTY;  Surgeon: Peter G Dalldorf, MD;  Location: [facility] OR;  Service: Orthopedics;  Laterality: Right;  DEPUY, RNFA  . WRIST SURGERY Right    x 3, torn ligaments, pins placed then infected, then fused with plate     Family History  Problem Relation Age of Onset  . COPD Mother        smoker  . Hypertension Mother   . Heart disease Mother        CHF  . Diabetes Father   . Heart disease Father        CABG in 50's  . Stroke Father    . Hypertension Father   . Peripheral vascular disease Father   . Hypertension Son        tachycardia  . Heart disease Maternal Grandfather   . Arthritis Paternal Grandmother   . Colon cancer Neg Hx   . Rectal cancer Neg Hx   . Stomach cancer Neg Hx      Social History   Socioeconomic History  . Marital status: Single    Spouse name: Not on file  . Number of children: 2  . Years of education: Not on file  . Highest education   level: High school graduate  Occupational History  . Occupation: Disabled  Social Needs  . Financial resource strain: Not on file  . Food insecurity    Worry: Not on file    Inability: Not on file  . Transportation needs    Medical: Not on file    Non-medical: Not on file  Tobacco Use  . Smoking status: Never Smoker  . Smokeless tobacco: Current User    Types: Snuff  Substance and Sexual Activity  . Alcohol use: No    Frequency: Never  . Drug use: No  . Sexual activity: Yes  Lifestyle  . Physical activity    Days per week: Not on file    Minutes per session: Not on file  . Stress: Not on file  Relationships  . Social connections    Talks on phone: Not on file    Gets together: Not on file    Attends religious service: Not on file    Active member of club or organization: Not on file    Attends meetings of clubs or organizations: Not on file    Relationship status: Not on file  . Intimate partner violence    Fear of current or ex partner: Not on file    Emotionally abused: Not on file    Physically abused: Not on file    Forced sexual activity: Not on file  Other Topics Concern  . Not on file  Social History Narrative   Lives at home with his father (he takes care of his father)   Right handed   Drinks 2 cups of caffeine daily     BP 130/85   Pulse 85   Temp (!) 97.3 F (36.3 C) (Temporal)   Ht 6' (1.829 m)   Wt 221 lb (100.2 kg)   SpO2 99%   BMI 29.97 kg/m   Physical Exam:  Well appearing NAD HEENT: Unremarkable  Neck:  No JVD, no thyromegally Lymphatics:  No adenopathy Back:  No CVA tenderness Lungs:  Clear HEART:  Regular rate rhythm, no murmurs, no rubs, no clicks Abd:  soft, positive bowel sounds, no organomegally, no rebound, no guarding Ext:  2 plus pulses, no edema, no cyanosis, no clubbing Skin:  No rashes no nodules Neuro:  CN II through XII intact, motor grossly intact  EKG - reviewed. No pre-excitation  Assess/Plan: 1. 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 I have reviewed the indications/risks/benefits/goals/expectations of the procedure with the patient. He will call us if he wishes to proceed with ablation. 2. CAD - he has distal LAD disease but is asymptomatic except when he goes into SVT.  3. HTN - his bp is controlled today. He will continue metoprolol.  Sean Horn,M.D. 

## 2019-07-14 NOTE — Progress Notes (Signed)
HPI Sean Horn is referred today by Bernerd Pho for evaluation of a wide and narrow QRS tachycardia. He states that his symptoms are paroxysmal. He has had documented tachycardia at rates of 180/min. In SVT his wide QRS rate is the same as the narrow QRS tachycardia. He has not had syncope but gets sob and has chest pressure in SVT. The episodes start and stop suddenly. He was not certain as to whether he had ever received adenosine.  Allergies  Allergen Reactions  . Rosuvastatin Other (See Comments)  . Toradol [Ketorolac Tromethamine] Itching     Current Outpatient Medications  Medication Sig Dispense Refill  . aspirin EC 81 MG EC tablet Take 1 tablet (81 mg total) by mouth daily. 90 tablet 3  . atorvastatin (LIPITOR) 10 MG tablet Take 0.5 tablets (5 mg total) by mouth every other day. 45 tablet 0  . clopidogrel (PLAVIX) 75 MG tablet Take 1 tablet (75 mg total) by mouth daily with breakfast. 30 tablet 6  . Coenzyme Q10 (COQ-10) 75 MG CAPS Take 75 mg by mouth daily.  0  . diclofenac Sodium (VOLTAREN) 1 % GEL Apply 2 g topically Nightly.    . Dulaglutide (TRULICITY) 1.5 DX/8.3JA SOPN Inject 1.5 mg into the skin once a week. NEED APPOINTMENT FOR FURTHER REFILLS (Patient taking differently: Inject 1.5 mg into the skin once a week. ) 12 mL 0  . escitalopram (LEXAPRO) 20 MG tablet Take 1 tablet by mouth once daily (Patient taking differently: Take 20 mg by mouth daily. ) 90 tablet 0  . EUTHYROX 25 MCG tablet TAKE 1 TABLET BY MOUTH ONCE DAILY BEFORE BREAKFAST (Patient taking differently: Take 25 mcg by mouth daily before breakfast. ) 90 tablet 0  . insulin aspart (NOVOLOG FLEXPEN) 100 UNIT/ML FlexPen Inject 25-35 units 3 times daily with meals (Patient taking differently: Inject 10 Units into the skin 3 (three) times daily with meals. ) 30 mL 5  . insulin detemir (LEVEMIR) 100 UNIT/ML injection Inject 0.75 mLs (75 Units total) into the skin at bedtime. (Patient taking differently: Inject  60 Units into the skin at bedtime. ) 80 mL 5  . isosorbide mononitrate (IMDUR) 30 MG 24 hr tablet Take 1 tablet (30 mg total) by mouth daily. 30 tablet 6  . lansoprazole (PREVACID) 30 MG capsule Take 30 mg by mouth every morning.     . loratadine (CLARITIN) 10 MG tablet Take 10 mg by mouth every morning.     . metFORMIN (GLUCOPHAGE-XR) 500 MG 24 hr tablet Take 1 tablet (500 mg total) by mouth 2 (two) times daily with a meal. 60 tablet 11  . metoprolol succinate (TOPROL-XL) 50 MG 24 hr tablet Take 1 tablet (50 mg total) by mouth daily. Take with or immediately following a meal. 90 tablet 3  . nitroGLYCERIN (NITROSTAT) 0.4 MG SL tablet Place 1 tablet (0.4 mg total) under the tongue every 5 (five) minutes as needed. 25 tablet 12  . sildenafil (REVATIO) 20 MG tablet Take 20 mg by mouth daily as needed.     . topiramate (TOPAMAX) 50 MG tablet Take 1 tablet (50 mg total) by mouth at bedtime. 30 tablet 6   No current facility-administered medications for this visit.      Past Medical History:  Diagnosis Date  . Anxiety   . CAD in native artery    a. cath 06/26/2019 - 80% apical LAD >> medical management   . Chronic headaches   . Chronic neck  pain   . GERD (gastroesophageal reflux disease)    occasional - OTC as needed  . History of chicken pox   . History of kidney stones   . History of shingles   . Hypertension    under control with med., had been on med. since age 34  . Insomnia 09/13/2016  . Insulin dependent diabetes mellitus    Follows with endocrinology, Dr Chestine Sporelark report55s his last hgba1c was 7.3   . Non-insulin dependent type 2 diabetes mellitus (HCC)   . Obesity 01/30/2017  . Occipital neuralgia   . Osteoarthritis    right knee  . PONV (postoperative nausea and vomiting)   . PONV (postoperative nausea and vomiting)   . Preventative health care 08/14/2013  . Tachycardia   . Urinary frequency 12/11/2016    ROS:   All systems reviewed and negative except as noted in the HPI.    Past Surgical History:  Procedure Laterality Date  . ANTERIOR CERVICAL DECOMP/DISCECTOMY FUSION N/A 01/16/2018   Procedure: ANTERIOR CERVICAL DECOMPRESSION/DISCECTOMY FUSION CERVICAL FOUR-FIVE, CERVICAL FIVE-SIX;  Surgeon: Lisbeth RenshawNundkumar, Neelesh, MD;  Location: [facility] OR;  Service: Neurosurgery;  Laterality: N/A;  ANTERIOR CERVICAL DECOMPRESSION/DISCECTOMY FUSION CERVICAL FOUR-FIVE, CERVICAL FIVE-SIX  . CHOLECYSTECTOMY  1980's  . CYSTOSCOPY/RETROGRADE/URETEROSCOPY/STONE EXTRACTION WITH BASKET Right 08/27/2003   and double J stent placement  . INGUINAL HERNIA REPAIR Left 1990's  . KNEE ARTHROSCOPY Left 1980's  . KNEE ARTHROSCOPY Right 2013  . KNEE ARTHROSCOPY Right 06/30/2013   Procedure: RIGHT KNEE ARTHROSCOPY AND SYNOVECTOMY;  Surgeon: Velna OchsPeter G Dalldorf, MD;  Location: [facility];  Service: Orthopedics;  Laterality: Right;  . LEFT HEART CATH AND CORONARY ANGIOGRAPHY N/A 06/26/2019   Procedure: LEFT HEART CATH AND CORONARY ANGIOGRAPHY;  Surgeon: Marykay LexHarding, David W, MD;  [facility];  Service: Cardiovascular;  Laterality: N/A;  . LITHOTRIPSY     multiple, b/l  . SHOULDER ADHESION RELEASE Left ` 1998  . SHOULDER ARTHROSCOPY W/ ROTATOR CUFF REPAIR Left 10/20/2004   x 4, had to have part of clavile removed. torn rotator cuff multiple times, had to place screws   . SHOULDER ARTHROSCOPY W/ SUPERIOR LABRAL ANTERIOR POSTERIOR LESION REPAIR Left 12/22/2004  . TOTAL KNEE ARTHROPLASTY Right 12/23/2012   x3, 2 arthroscopies and a TKR,  TOTAL KNEE ARTHROPLASTY;  Surgeon: Velna OchsPeter G Dalldorf, MD;  Location: [facility] OR;  Service: Orthopedics;  Laterality: Right;  DEPUY, RNFA  . WRIST SURGERY Right    x 3, torn ligaments, pins placed then infected, then fused with plate     Family History  Problem Relation Age of Onset  . COPD Mother        smoker  . Hypertension Mother   . Heart disease Mother        CHF  . Diabetes Father   . Heart disease Father        CABG in 2550's  . Stroke Father    . Hypertension Father   . Peripheral vascular disease Father   . Hypertension Son        tachycardia  . Heart disease Maternal Grandfather   . Arthritis Paternal Grandmother   . Colon cancer Neg Hx   . Rectal cancer Neg Hx   . Stomach cancer Neg Hx      Social History   Socioeconomic History  . Marital status: Single    Spouse name: Not on file  . Number of children: 2  . Years of education: Not on file  . Highest education  level: High school graduate  Occupational History  . Occupation: Disabled  Social Needs  . Financial resource strain: Not on file  . Food insecurity    Worry: Not on file    Inability: Not on file  . Transportation needs    Medical: Not on file    Non-medical: Not on file  Tobacco Use  . Smoking status: Never Smoker  . Smokeless tobacco: Current User    Types: Snuff  Substance and Sexual Activity  . Alcohol use: No    Frequency: Never  . Drug use: No  . Sexual activity: Yes  Lifestyle  . Physical activity    Days per week: Not on file    Minutes per session: Not on file  . Stress: Not on file  Relationships  . Social Musician on phone: Not on file    Gets together: Not on file    Attends religious service: Not on file    Active member of club or organization: Not on file    Attends meetings of clubs or organizations: Not on file    Relationship status: Not on file  . Intimate partner violence    Fear of current or ex partner: Not on file    Emotionally abused: Not on file    Physically abused: Not on file    Forced sexual activity: Not on file  Other Topics Concern  . Not on file  Social History Narrative   Lives at home with his father (he takes care of his father)   Right handed   Drinks 2 cups of caffeine daily     BP 130/85   Pulse 85   Temp (!) 97.3 F (36.3 C) (Temporal)   Ht 6' (1.829 m)   Wt 221 lb (100.2 kg)   SpO2 99%   BMI 29.97 kg/m   Physical Exam:  Well appearing NAD HEENT: Unremarkable  Neck:  No JVD, no thyromegally Lymphatics:  No adenopathy Back:  No CVA tenderness Lungs:  Clear HEART:  Regular rate rhythm, no murmurs, no rubs, no clicks Abd:  soft, positive bowel sounds, no organomegally, no rebound, no guarding Ext:  2 plus pulses, no edema, no cyanosis, no clubbing Skin:  No rashes no nodules Neuro:  CN II through XII intact, motor grossly intact  EKG - reviewed. No pre-excitation  Assess/Plan: 1. 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 I have reviewed the indications/risks/benefits/goals/expectations of the procedure with the patient. He will call us if he wishes to proceed with ablation. 2. CAD - he has distal LAD disease but is asymptomatic except when he goes into SVT.  3. HTN - his bp is controlled today. He will continue metoprolol.  Leonia Reeves.D.

## 2019-07-14 NOTE — Patient Instructions (Signed)
Medication Instructions:  Your physician recommends that you continue on your current medications as directed. Please refer to the Current Medication list given to you today.  *If you need a refill on your cardiac medications before your next appointment, please call your pharmacy*  Lab Work: NONE  If you have labs (blood work) drawn today and your tests are completely normal, you will receive your results only by: Marland Kitchen MyChart Message (if you have MyChart) OR . A paper copy in the mail If you have any lab test that is abnormal or we need to change your treatment, we will call you to review the results.  Testing/Procedures: NONE   Follow-Up: At [facility], you and your health needs are our priority.  As part of our continuing mission to provide you with exceptional heart care, we have created designated Provider Care Teams.  These Care Teams include your primary Cardiologist (physician) and Advanced Practice Providers (APPs -  Physician Assistants and Nurse Practitioners) who all work together to provide you with the care you need, when you need it.  Your next appointment:    To be Determined   The format for your next appointment:   Either In Person or Virtual  Provider:   [name], MD  Other Instructions Thank you for choosing [facility]!

## 2019-07-20 ENCOUNTER — Other Ambulatory Visit (HOSPITAL_COMMUNITY)
Admission: RE | Admit: 2019-07-20 | Discharge: 2019-07-20 | Disposition: A | Discharge status: Home / Self Care | Payer: Medicare HMO | Source: Ambulatory Visit | Attending: Internal Medicine | Admitting: Internal Medicine

## 2019-07-20 ENCOUNTER — Other Ambulatory Visit: Payer: Self-pay

## 2019-07-20 DIAGNOSIS — I471 Supraventricular tachycardia: Secondary | ICD-10-CM | POA: Insufficient documentation

## 2019-07-20 DIAGNOSIS — Z20828 Contact with and (suspected) exposure to other viral communicable diseases: Secondary | ICD-10-CM | POA: Insufficient documentation

## 2019-07-20 DIAGNOSIS — Z01812 Encounter for preprocedural laboratory examination: Secondary | ICD-10-CM | POA: Diagnosis not present

## 2019-07-20 LAB — BASIC METABOLIC PANEL
Anion gap: 10 (ref 5–15)
BUN: 10 mg/dL (ref 6–20)
CO2: 28 mmol/L (ref 22–32)
Calcium: 9.2 mg/dL (ref 8.9–10.3)
Chloride: 100 mmol/L (ref 98–111)
Creatinine, Ser: 0.85 mg/dL (ref 0.61–1.24)
GFR calc Af Amer: >60 mL/min (ref >60)
GFR calc non Af Amer: >60 mL/min (ref >60)
Glucose, Bld: 207 mg/dL — ABNORMAL HIGH (ref 70–99)
Potassium: 3.9 mmol/L (ref 3.5–5.1)
Sodium: 138 mmol/L (ref 135–145)

## 2019-07-20 LAB — CBC
HCT: 41.4 % (ref 39.0–52.0)
Hemoglobin: 14.7 g/dL (ref 13.0–17.0)
MCH: 31.3 pg (ref 26.0–34.0)
MCHC: 35.5 g/dL (ref 30.0–36.0)
MCV: 88.3 fL (ref 80.0–100.0)
Platelets: 251 10*3/uL (ref 150–400)
RBC: 4.69 MIL/uL (ref 4.22–5.81)
RDW: 12.2 % (ref 11.5–15.5)
WBC: 5.8 10*3/uL (ref 4.0–10.5)
nRBC: 0 % (ref 0.0–0.2)

## 2019-07-21 ENCOUNTER — Other Ambulatory Visit (HOSPITAL_COMMUNITY)
Admission: RE | Admit: 2019-07-21 | Discharge: 2019-07-21 | Disposition: A | Discharge status: Home / Self Care | Payer: Medicare HMO | Source: Ambulatory Visit | Attending: Internal Medicine | Admitting: Internal Medicine

## 2019-07-21 LAB — SARS CORONAVIRUS 2 (TAT 6-24 HRS): SARS Coronavirus 2: NEGATIVE

## 2019-07-22 ENCOUNTER — Other Ambulatory Visit: Payer: Medicare HMO

## 2019-07-23 ENCOUNTER — Ambulatory Visit (HOSPITAL_COMMUNITY)
Admission: RE | Admit: 2019-07-23 | Discharge: 2019-07-23 | Disposition: A | Discharge status: Home / Self Care | Payer: Medicare HMO | Attending: Internal Medicine | Admitting: Internal Medicine

## 2019-07-23 ENCOUNTER — Encounter (HOSPITAL_COMMUNITY)
Admission: RE | Disposition: A | Discharge status: Home / Self Care | Payer: Self-pay | Source: Home / Self Care | Attending: Internal Medicine

## 2019-07-23 ENCOUNTER — Other Ambulatory Visit: Payer: Self-pay

## 2019-07-23 DIAGNOSIS — I471 Supraventricular tachycardia, unspecified: Secondary | ICD-10-CM | POA: Diagnosis present

## 2019-07-23 DIAGNOSIS — F419 Anxiety disorder, unspecified: Secondary | ICD-10-CM | POA: Diagnosis not present

## 2019-07-23 DIAGNOSIS — Z794 Long term (current) use of insulin: Secondary | ICD-10-CM | POA: Insufficient documentation

## 2019-07-23 DIAGNOSIS — Z6829 Body mass index (BMI) 29.0-29.9, adult: Secondary | ICD-10-CM | POA: Insufficient documentation

## 2019-07-23 DIAGNOSIS — I2510 Atherosclerotic heart disease of native coronary artery without angina pectoris: Secondary | ICD-10-CM | POA: Insufficient documentation

## 2019-07-23 DIAGNOSIS — E119 Type 2 diabetes mellitus without complications: Secondary | ICD-10-CM | POA: Diagnosis not present

## 2019-07-23 DIAGNOSIS — K219 Gastro-esophageal reflux disease without esophagitis: Secondary | ICD-10-CM | POA: Diagnosis not present

## 2019-07-23 DIAGNOSIS — Z7982 Long term (current) use of aspirin: Secondary | ICD-10-CM | POA: Diagnosis not present

## 2019-07-23 DIAGNOSIS — E669 Obesity, unspecified: Secondary | ICD-10-CM | POA: Diagnosis not present

## 2019-07-23 DIAGNOSIS — Z7902 Long term (current) use of antithrombotics/antiplatelets: Secondary | ICD-10-CM | POA: Insufficient documentation

## 2019-07-23 DIAGNOSIS — Z79899 Other long term (current) drug therapy: Secondary | ICD-10-CM | POA: Diagnosis not present

## 2019-07-23 DIAGNOSIS — R69 Illness, unspecified: Secondary | ICD-10-CM | POA: Diagnosis not present

## 2019-07-23 DIAGNOSIS — I10 Essential (primary) hypertension: Secondary | ICD-10-CM | POA: Diagnosis not present

## 2019-07-23 HISTORY — PX: SVT ABLATION: EP1225

## 2019-07-23 HISTORY — PX: ELECTROPHYSIOLOGY STUDY: EP1205

## 2019-07-23 LAB — GLUCOSE, CAPILLARY
Glucose-Capillary: 212 mg/dL — ABNORMAL HIGH (ref 70–99)
Glucose-Capillary: 150 mg/dL — ABNORMAL HIGH (ref 70–99)

## 2019-07-23 SURGERY — SVT ABLATION

## 2019-07-23 MED ORDER — METOPROLOL TARTRATE 5 MG/5ML IV SOLN
INTRAVENOUS | Status: DC | PRN
  Administered 2019-07-23: 5 mg via INTRAVENOUS

## 2019-07-23 MED ORDER — METOPROLOL TARTRATE 5 MG/5ML IV SOLN
INTRAVENOUS | Status: AC
  Filled 2019-07-23: qty 5

## 2019-07-23 MED ORDER — FENTANYL CITRATE (PF) 100 MCG/2ML IJ SOLN
INTRAMUSCULAR | Status: AC
  Filled 2019-07-23: qty 2

## 2019-07-23 MED ORDER — ISOPROTERENOL HCL 0.2 MG/ML IJ SOLN: INTRAVENOUS | Status: DC | PRN

## 2019-07-23 MED ORDER — ACETAMINOPHEN 325 MG PO TABS
650.0000 mg | ORAL_TABLET | ORAL | Status: DC | PRN
  Filled 2019-07-23: qty 2

## 2019-07-23 MED ORDER — ONDANSETRON HCL 4 MG/2ML IJ SOLN: 4.0000 mg | Freq: Four times a day (QID) | INTRAMUSCULAR | Status: DC | PRN

## 2019-07-23 MED ORDER — FENTANYL CITRATE (PF) 100 MCG/2ML IJ SOLN
INTRAMUSCULAR | Status: DC | PRN
  Administered 2019-07-23 (×3): 12.5 ug via INTRAVENOUS
  Administered 2019-07-23: 25 ug via INTRAVENOUS
  Administered 2019-07-23 (×3): 12.5 ug via INTRAVENOUS

## 2019-07-23 MED ORDER — MIDAZOLAM HCL 5 MG/5ML IJ SOLN
INTRAMUSCULAR | Status: AC
  Filled 2019-07-23: qty 5

## 2019-07-23 MED ORDER — SODIUM CHLORIDE 0.9% FLUSH: 3.0000 mL | INTRAVENOUS | Status: DC | PRN

## 2019-07-23 MED ORDER — SODIUM CHLORIDE 0.9 % IV SOLN: 250.0000 mL | INTRAVENOUS | Status: DC | PRN

## 2019-07-23 MED ORDER — FENTANYL CITRATE (PF) 100 MCG/2ML IJ SOLN
25.0000 ug | Freq: Once | INTRAMUSCULAR | Status: AC
  Administered 2019-07-23: 25 ug via INTRAVENOUS

## 2019-07-23 MED ORDER — ACETAMINOPHEN 325 MG PO TABS: 650.0000 mg | ORAL_TABLET | ORAL | Status: DC | PRN

## 2019-07-23 MED ORDER — ISOPROTERENOL HCL 0.2 MG/ML IJ SOLN
INTRAMUSCULAR | Status: AC
  Filled 2019-07-23: qty 5

## 2019-07-23 MED ORDER — SODIUM CHLORIDE 0.9 % IV SOLN
INTRAVENOUS | Status: DC | PRN
  Administered 2019-07-23: 09:00:00 4 ug/min via INTRAVENOUS

## 2019-07-23 MED ORDER — HEPARIN (PORCINE) IN NACL 1000-0.9 UT/500ML-% IV SOLN
INTRAVENOUS | Status: DC | PRN
  Administered 2019-07-23: 500 mL

## 2019-07-23 MED ORDER — HYDRALAZINE HCL 20 MG/ML IJ SOLN
INTRAMUSCULAR | Status: DC | PRN
  Administered 2019-07-23 (×2): 5 mg via INTRAVENOUS

## 2019-07-23 MED ORDER — BUPIVACAINE HCL (PF) 0.25 % IJ SOLN
INTRAMUSCULAR | Status: DC | PRN
  Administered 2019-07-23: 60 mL

## 2019-07-23 MED ORDER — MIDAZOLAM HCL 5 MG/5ML IJ SOLN
INTRAMUSCULAR | Status: DC | PRN
  Administered 2019-07-23 (×8): 1 mg via INTRAVENOUS

## 2019-07-23 MED ORDER — HYDRALAZINE HCL 20 MG/ML IJ SOLN
INTRAMUSCULAR | Status: AC
  Filled 2019-07-23: qty 1

## 2019-07-23 MED ORDER — SODIUM CHLORIDE 0.9 % IV SOLN: INTRAVENOUS | Status: DC

## 2019-07-23 MED ORDER — HYDRALAZINE HCL 20 MG/ML IJ SOLN
10.0000 mg | Freq: Once | INTRAMUSCULAR | Status: AC
  Administered 2019-07-23: 10 mg via INTRAVENOUS

## 2019-07-23 SURGICAL SUPPLY — 11 items
BAG SNAP BAND KOVER 36X36 (MISCELLANEOUS) ×1 IMPLANT
CATH EZ STEER NAV 4MM D-F CUR (ABLATOR) ×1 IMPLANT
CATH HEX JOS 2-5-2 65CM 6F REP (CATHETERS) ×1 IMPLANT
CATH JOSEPH QUAD ALLRED 6F REP (CATHETERS) ×2 IMPLANT
PACK EP LATEX FREE (CUSTOM PROCEDURE TRAY) ×2
PACK EP LF (CUSTOM PROCEDURE TRAY) ×1 IMPLANT
PAD PRO RADIOLUCENT 2001M-C (PAD) ×2 IMPLANT
PATCH CARTO3 (PAD) ×1 IMPLANT
SHEATH PINNACLE 6F 10CM (SHEATH) ×2 IMPLANT
SHEATH PINNACLE 7F 10CM (SHEATH) ×1 IMPLANT
SHEATH PINNACLE 8F 10CM (SHEATH) ×1 IMPLANT

## 2019-07-23 NOTE — Interval H&P Note (Signed)
History and Physical Interval Note:  07/23/2019 7:34 AM  Sean Horn  has presented today for surgery, with the diagnosis of svt.  The various methods of treatment have been discussed with the patient and family. After consideration of risks, benefits and other options for treatment, the patient has consented to  Procedure(s): SVT ABLATION (N/A) ELECTROPHYSIOLOGY STUDY (N/A) as a surgical intervention.  The patient's history has been reviewed, patient examined, no change in status, stable for surgery.  I have reviewed the patient's chart and labs.  Questions were answered to the patient's satisfaction.     [name]

## 2019-07-23 NOTE — Discharge Instructions (Signed)

## 2019-07-23 NOTE — Progress Notes (Signed)
No bleeding or hematoma noted after ambulation 

## 2019-07-23 NOTE — Progress Notes (Signed)
Site area: rt ij venous sheath Site Prior to Removal:  Level 0 Pressure Applied For: 10 minutes Manual:   yes Patient Status During Pull:  stable Post Pull Site:  Level  0 Post Pull Instructions Given:  yes Post Pull Pulses Present: NA Dressing Applied:  vasolene gauze, gauze and tegaderm Bedrest begins @  Comments:

## 2019-07-23 NOTE — Progress Notes (Addendum)
Cardiology PA/ Jonni Sanger was paged in regards to pts BP, bp ok as it is, can take meds when he gets home. [place] Dr [facility] Le states pt can take BP meds at home. Pt verbalized understanding.

## 2019-07-23 NOTE — Progress Notes (Signed)
Site area: 3 rt fv sheaths pulled by Carlean Jews Site Prior to Removal:  Level 0 Pressure Applied For: 20 minutes Manual:   yes Patient Status During Pull:  stable Post Pull Site:  Level  0 Post Pull Instructions Given:  yes Post Pull Pulses Present: rt dp palpable Dressing Applied:  Gauze and tegaderm Bedrest begins @ 10 45 X 6 hr. Comments:  IV saline locked

## 2019-07-25 DIAGNOSIS — G4733 Obstructive sleep apnea (adult) (pediatric): Secondary | ICD-10-CM | POA: Diagnosis not present

## 2019-08-11 ENCOUNTER — Other Ambulatory Visit: Payer: Self-pay

## 2019-08-11 ENCOUNTER — Telehealth: Payer: Self-pay | Admitting: *Deleted

## 2019-08-11 ENCOUNTER — Ambulatory Visit: Payer: Medicare HMO | Admitting: Cardiology

## 2019-08-11 ENCOUNTER — Encounter: Payer: Self-pay | Admitting: Cardiology

## 2019-08-11 VITALS — BP 118/84 | HR 95 | Ht 72.0 in | Wt 222.0 lb

## 2019-08-11 DIAGNOSIS — I10 Essential (primary) hypertension: Secondary | ICD-10-CM

## 2019-08-11 DIAGNOSIS — E118 Type 2 diabetes mellitus with unspecified complications: Secondary | ICD-10-CM | POA: Diagnosis not present

## 2019-08-11 DIAGNOSIS — Z794 Long term (current) use of insulin: Secondary | ICD-10-CM | POA: Diagnosis not present

## 2019-08-11 DIAGNOSIS — Z9889 Other specified postprocedural states: Secondary | ICD-10-CM | POA: Diagnosis not present

## 2019-08-11 DIAGNOSIS — G459 Transient cerebral ischemic attack, unspecified: Secondary | ICD-10-CM | POA: Diagnosis not present

## 2019-08-11 DIAGNOSIS — I2510 Atherosclerotic heart disease of native coronary artery without angina pectoris: Secondary | ICD-10-CM

## 2019-08-11 DIAGNOSIS — I471 Supraventricular tachycardia: Secondary | ICD-10-CM

## 2019-08-11 MED ORDER — ATORVASTATIN CALCIUM 40 MG PO TABS: 40.0000 mg | ORAL_TABLET | Freq: Every day | ORAL | 3 refills | Status: DC

## 2019-08-11 NOTE — Telephone Encounter (Signed)
Left message to return call . Pt had cath and no longer needs cardiac cta on 09/02/19

## 2019-08-11 NOTE — Addendum Note (Signed)
Addended by: Fayrene Fearing B on: 08/11/2019 04:52 PM   Modules accepted: Orders

## 2019-08-11 NOTE — Progress Notes (Signed)
Cardiology Office Note:    Date:  08/11/2019   ID:  Sean Kneeharles W Seder, DOB 03/07/1961, MRN [id number]  PCP:  Pearline Cablesopland, Jessica C, MD  Cardiologist:  Thomasene RippleKardie Athziri Freundlich, DO  Electrophysiologist:  None   Referring MD: Pearline Cablesopland, Jessica C, MD   Chief Complaint  Patient presents with  . Follow-up    History of Present Illness:    Sean Horn is a 59 y.o. male with a hx of hypertension, NSTEMI/coronary artery disease status post left heart cath June 26, 2019 with 80% apical LAD lesions which has been stated for medical management due to this lesion being a unsuitable candidate for PCI, NSVT seen on monitor, SVT/AVNRT status post ablation on July 23, 2019.  I last saw the patient on June 16, 2019 at that time he was supposed ED visit for palpitation that he was already wearing a event monitor.  During his visit he reported that he was experiencing some chest pain therefore I ordered a CTA coronaries to evaluate for coronary artery disease.  In the interim the patient developed chest pain and was admitted at the [facility] for NSTEMI he was subsequently [facility] he underwent a left heart catheterization.  His left heart catheterization revealed 80% LAD apical lesion which was unsuitable for PCI therefore aggressive medical management was recommended.  He also did have a mid LAD spasm which at the time of the procedure was alleviated by nitroglycerin intracoronary.  On July 23, 2019 he underwent EP study found to have AVNRT and is now status post ablation.  The patient is here today for follow-up visit.  He tells me that he has been experiencing neurolyse fatigue but is still able to do his activities.  He denies any chest pain, first of breath or palpitations.  Past Medical History:  Diagnosis Date  . Anxiety   . CAD in native artery    a. cath 06/26/2019 - 80% apical LAD >> medical management   . Chronic headaches   . Chronic neck pain     . GERD (gastroesophageal reflux disease)    occasional - OTC as needed  . History of chicken pox   . History of kidney stones   . History of shingles   . Hypertension    under control with med., had been on med. since age 59  . Insomnia 09/13/2016  . Insulin dependent diabetes mellitus    Follows with endocrinology, Dr Chestine Sporelark reports his last hgba1c was 7.3   . Non-insulin dependent type 2 diabetes mellitus (HCC)   . Obesity 01/30/2017  . Occipital neuralgia   . Osteoarthritis    right knee  . PONV (postoperative nausea and vomiting)   . PONV (postoperative nausea and vomiting)   . Preventative health care 08/14/2013  . Tachycardia   . Urinary frequency 12/11/2016    Past Surgical History:  Procedure Laterality Date  . ANTERIOR CERVICAL DECOMP/DISCECTOMY FUSION N/A 01/16/2018   Procedure: ANTERIOR CERVICAL DECOMPRESSION/DISCECTOMY FUSION CERVICAL FOUR-FIVE, CERVICAL FIVE-SIX;  Surgeon: Lisbeth RenshawNundkumar, Neelesh, MD;  Location: [facility] OR;  Service: Neurosurgery;  Laterality: N/A;  ANTERIOR CERVICAL DECOMPRESSION/DISCECTOMY FUSION CERVICAL FOUR-FIVE, CERVICAL FIVE-SIX  . CHOLECYSTECTOMY  1980's  . CYSTOSCOPY/RETROGRADE/URETEROSCOPY/STONE EXTRACTION WITH BASKET Right 08/27/2003   and double J stent placement  . ELECTROPHYSIOLOGY STUDY N/A 07/23/2019   Procedure: ELECTROPHYSIOLOGY STUDY;  Surgeon: Marinus Mawaylor, Gregg W, MD;  [facility];  Service: Cardiovascular;  Laterality: N/A;  . INGUINAL HERNIA REPAIR Left 1990's  .  KNEE ARTHROSCOPY Left 1980's  . KNEE ARTHROSCOPY Right 2013  . KNEE ARTHROSCOPY Right 06/30/2013   Procedure: RIGHT KNEE ARTHROSCOPY AND SYNOVECTOMY;  Surgeon: Hessie Dibble, MD;  Location: [place];  Service: Orthopedics;  Laterality: Right;  . LEFT HEART CATH AND CORONARY ANGIOGRAPHY N/A 06/26/2019   Procedure: LEFT HEART CATH AND CORONARY ANGIOGRAPHY;  Surgeon: Leonie Man, MD;  Location: [place] CV LAB;  Service: Cardiovascular;  Laterality:  N/A;  . LITHOTRIPSY     multiple, b/l  . SHOULDER ADHESION RELEASE Left ` 1998  . SHOULDER ARTHROSCOPY W/ ROTATOR CUFF REPAIR Left 10/20/2004   x 4, had to have part of clavile removed. torn rotator cuff multiple times, had to place screws   . SHOULDER ARTHROSCOPY W/ SUPERIOR LABRAL ANTERIOR POSTERIOR LESION REPAIR Left 12/22/2004  . SVT ABLATION N/A 07/23/2019   Procedure: SVT ABLATION;  Surgeon: Evans Lance, MD;  Location: [place] CV LAB;  Service: Cardiovascular;  Laterality: N/A;  . TOTAL KNEE ARTHROPLASTY Right 12/23/2012   x3, 2 arthroscopies and a TKR,  TOTAL KNEE ARTHROPLASTY;  Surgeon: Hessie Dibble, MD;  Location: [place];  Service: Orthopedics;  Laterality: Right;  DEPUY, RNFA  . WRIST SURGERY Right    x 3, torn ligaments, pins placed then infected, then fused with plate    Current Medications: Current Meds  Medication Sig  . aspirin EC 81 MG EC tablet Take 1 tablet (81 mg total) by mouth daily.  . clopidogrel (PLAVIX) 75 MG tablet Take 1 tablet (75 mg total) by mouth daily with breakfast.  . Coenzyme Q10 (COQ-10) 75 MG CAPS Take 75 mg by mouth daily.  . diclofenac Sodium (VOLTAREN) 1 % GEL Apply 2 g topically Nightly.  . escitalopram (LEXAPRO) 20 MG tablet Take 1 tablet by mouth once daily (Patient taking differently: Take 20 mg by mouth daily. )  . EUTHYROX 25 MCG tablet TAKE 1 TABLET BY MOUTH ONCE DAILY BEFORE BREAKFAST (Patient taking differently: Take 25 mcg by mouth daily before breakfast. )  . insulin aspart (NOVOLOG FLEXPEN) 100 UNIT/ML FlexPen Inject 25-35 units 3 times daily with meals (Patient taking differently: Inject 10 Units into the skin 3 (three) times daily with meals. )  . insulin detemir (LEVEMIR) 100 UNIT/ML injection Inject 0.75 mLs (75 Units total) into the skin at bedtime. (Patient taking differently: Inject 60 Units into the skin at bedtime. )  . isosorbide mononitrate (IMDUR) 30 MG 24 hr tablet Take 1 tablet (30 mg total) by mouth daily.  .  lansoprazole (PREVACID) 30 MG capsule Take 30 mg by mouth every morning.   . loratadine (CLARITIN) 10 MG tablet Take 10 mg by mouth every morning.   . metFORMIN (GLUCOPHAGE-XR) 500 MG 24 hr tablet Take 1 tablet (500 mg total) by mouth 2 (two) times daily with a meal.  . metoprolol succinate (TOPROL-XL) 50 MG 24 hr tablet Take 1 tablet (50 mg total) by mouth daily. Take with or immediately following a meal.  . nitroGLYCERIN (NITROSTAT) 0.4 MG SL tablet Place 1 tablet (0.4 mg total) under the tongue every 5 (five) minutes as needed.  . TRULICITY 3 OZ/2.2QM SOPN Inject 3 mg into the skin once a week. Tuesdays  . [DISCONTINUED] atorvastatin (LIPITOR) 10 MG tablet Take 0.5 tablets (5 mg total) by mouth every other day.     Allergies:   Rosuvastatin and Toradol [ketorolac tromethamine]   Social History   Socioeconomic History  . Marital status: Single  Spouse name: Not on file  . Number of children: 2  . Years of education: Not on file  . Highest education level: High school graduate  Occupational History  . Occupation: Disabled  Tobacco Use  . Smoking status: Never Smoker  . Smokeless tobacco: Current User    Types: Snuff  Substance and Sexual Activity  . Alcohol use: No  . Drug use: No  . Sexual activity: Yes  Other Topics Concern  . Not on file  Social History Narrative   Lives at home with his father (he takes care of his father)   Right handed   Drinks 2 cups of caffeine daily   Social Determinants of Health   Financial Resource Strain:   . Difficulty of Paying Living Expenses: Not on file  Food Insecurity:   . Worried About Programme researcher, broadcasting/film/video in the Last Year: Not on file  . Ran Out of Food in the Last Year: Not on file  Transportation Needs:   . Lack of Transportation (Medical): Not on file  . Lack of Transportation (Non-Medical): Not on file  Physical Activity:   . Days of Exercise per Week: Not on file  . Minutes of Exercise per Session: Not on file  Stress:     . Feeling of Stress : Not on file  Social Connections:   . Frequency of Communication with Friends and Family: Not on file  . Frequency of Social Gatherings with Friends and Family: Not on file  . Attends Religious Services: Not on file  . Active Member of Clubs or Organizations: Not on file  . Attends Banker Meetings: Not on file  . Marital Status: Not on file     Family History: The patient's family history includes Arthritis in his paternal grandmother; COPD in his mother; Diabetes in his father; Heart disease in his father, maternal grandfather, and mother; Hypertension in his father, mother, and son; Peripheral vascular disease in his father; Stroke in his father. There is no history of Colon cancer, Rectal cancer, or Stomach cancer.  ROS:   Review of Systems  Constitution: Reports fatigue. Negative for decreased appetite, fever and weight gain.  HENT: Negative for congestion, ear discharge, hoarse voice and sore throat.   Eyes: Negative for discharge, redness, vision loss in right eye and visual halos.  Cardiovascular: Negative for chest pain, dyspnea on exertion, leg swelling, orthopnea and palpitations.  Respiratory: Negative for cough, hemoptysis, shortness of breath and snoring.   Endocrine: Negative for heat intolerance and polyphagia.  Hematologic/Lymphatic: Negative for bleeding problem. Does not bruise/bleed easily.  Skin: Negative for flushing, nail changes, rash and suspicious lesions.  Musculoskeletal: Negative for arthritis, joint pain, muscle cramps, myalgias, neck pain and stiffness.  Gastrointestinal: Negative for abdominal pain, bowel incontinence, diarrhea and excessive appetite.  Genitourinary: Negative for decreased libido, genital sores and incomplete emptying.  Neurological: Negative for brief paralysis, focal weakness, headaches and loss of balance.  Psychiatric/Behavioral: Negative for altered mental status, depression and suicidal ideas.   Allergic/Immunologic: Negative for HIV exposure and persistent infections.    EKGs/Labs/Other Studies Reviewed:    The following studies were reviewed today:   EKG:  The ekg ordered today demonstrates sinus rhythm, heart rate 95 bpm, compared to EKG done on June 25, 2019 patient is no longer in sinus tachycardia.  Thoracic echocardiogram 06/12/2019  1. Left ventricular ejection fraction, by visual estimation, is 55 to 60%. The left ventricle has normal function. There is moderately increased left ventricular  hypertrophy.  2. Left ventricular diastolic parameters are consistent with Grade II diastolic dysfunction (pseudonormalization).  3. Global right ventricle has normal systolic function.The right ventricular size is normal. No increase in right ventricular wall thickness.  4. Left atrial size was normal.  5. Right atrial size was normal.  6. The mitral valve is grossly normal. Trace mitral valve regurgitation.  7. The tricuspid valve is grossly normal. Tricuspid valve regurgitation is trivial.  8. The aortic valve is tricuspid. Aortic valve regurgitation is not visualized.  9. The pulmonic valve was grossly normal. Pulmonic valve regurgitation is trivial. 10. Aortic dilatation noted. 11. There is mild dilatation of the ascending aorta. 12. TR signal is inadequate for assessing pulmonary artery systolic pressure. 13. The inferior vena cava is normal in size with greater than 50% respiratory variability, suggesting right atrial pressure of 3 mmHg.  Recent Labs: 11/03/2018: B Natriuretic Peptide 16.0; Magnesium 1.9 06/25/2019: TSH 1.714 06/26/2019: ALT 26 07/20/2019: BUN 10; Creatinine, Ser 0.85; Hemoglobin 14.7; Platelets 251; Potassium 3.9; Sodium 138  Recent Lipid Panel    Component Value Date/Time   CHOL 155 06/26/2019 0409   TRIG 254 (H) 06/26/2019 0409   HDL 27 (L) 06/26/2019 0409   CHOLHDL 5.7 06/26/2019 0409   VLDL 51 (H) 06/26/2019 0409   LDLCALC 77 06/26/2019 0409    LDLDIRECT 128.0 12/11/2016 1622    Physical Exam:    VS:  BP 118/84 (BP Location: Right Arm, Patient Position: Sitting, Cuff Size: Normal)   Pulse 95   Ht 6' (1.829 m)   Wt 222 lb (100.7 kg)   SpO2 98%   BMI 30.11 kg/m     Wt Readings from Last 3 Encounters:  08/11/19 222 lb (100.7 kg)  07/23/19 217 lb (98.4 kg)  07/14/19 221 lb (100.2 kg)     GEN: Well nourished, well developed in no acute distress HEENT: Normal NECK: No JVD; No carotid bruits LYMPHATICS: No lymphadenopathy CARDIAC: S1S2 noted,RRR, no murmurs, rubs, gallops RESPIRATORY:  Clear to auscultation without rales, wheezing or rhonchi  ABDOMEN: Soft, non-tender, non-distended, +bowel sounds, no guarding. EXTREMITIES: No edema, No cyanosis, no clubbing MUSCULOSKELETAL:  No edema; No deformity  SKIN: Warm and dry NEUROLOGIC:  Alert and oriented x 3, non-focal PSYCHIATRIC:  Normal affect, good insight  ASSESSMENT:    1. Coronary artery disease involving native coronary artery of native heart without angina pectoris   2. SVT (supraventricular tachycardia) (HCC)   3. Essential hypertension   4. TIA (transient ischemic attack)   5. Type 2 diabetes mellitus with complication, with long-term current use of insulin (HCC)   6. AVNRT (AV nodal re-entry tachycardia) (HCC)   7. Status post atrioventricular nodal ablation    PLAN:    He is improving clinically but tells me that he has significant fatigue.  I do believe that he is going to benefit from cardiac rehab to help with building up his stamina. In terms of his medication regimen I will increase his statin from 10 mg daily to 40 mg daily for high intensity dosing. I encouraged the patient to continue diet modification and increasing exercise activities which would help once he also start cardiac rehab.  Coronary artery disease-recent left heart cath however apical LAD lesion not a good candidate for PCI therefore we will continue with medical management.  He will  remain on aspirin 81 mg daily and Plavix 75 mg daily for 6 months.  Lipitor will be increased to 40 mg daily for high intensity dosing.  Hyperlipidemia-atorvastatin today has been increased to 40 mg daily.  Going to repeat his lipid profile and if triglycerides is still elevated he will benefit from Vascepa.  Diabetes mellitus continue patient on his current diabetic medication.  Hypertension-continue patient on his current medication regimen.  SVT/AVNRT-status post ablation.  Continue patient on his metoprolol 50 mg daily.  TIA- continue aspirin and statin.  The patient is in agreement with the above plan. The patient left the office in stable condition.  The patient will follow up in 3 months or sooner if needed   Medication Adjustments/Labs and Tests Ordered: Current medicines are reviewed at length with the patient today.  Concerns regarding medicines are outlined above.  Orders Placed This Encounter  Procedures  . AMB referral to cardiac rehabilitation  . EKG 12-Lead   Meds ordered this encounter  Medications  . atorvastatin (LIPITOR) 40 MG tablet    Sig: Take 1 tablet (40 mg total) by mouth daily.    Dispense:  90 tablet    Refill:  3    Patient Instructions  Medication Instructions:  Your physician has recommended you make the following change in your medication:   INCREASE: Lipitor(atorvastatin) to 40 mg Take 1 daily( May take 4 tabs of 10 mg daily until you run out)   *If you need a refill on your cardiac medications before your next appointment, please call your pharmacy*  Lab Work: None If you have labs (blood work) drawn today and your tests are completely normal, you will receive your results only by: Marland Kitchen MyChart Message (if you have MyChart) OR . A paper copy in the mail If you have any lab test that is abnormal or we need to change your treatment, we will call you to review the results.  Testing/Procedures: NOne  Follow-Up: At [facility], you and  your health needs are our priority.  As part of our continuing mission to provide you with exceptional heart care, we have created designated Provider Care Teams.  These Care Teams include your primary Cardiologist (physician) and Advanced Practice Providers (APPs -  Physician Assistants and Nurse Practitioners) who all work together to provide you with the care you need, when you need it.  Your next appointment:   3 month(s)  The format for your next appointment:   In Person  Provider:   Thomasene Ripple, DO  Other Instructions You are  Being referred to Cardiac rehab. They will contact you for appointment times and dates.     Adopting a Healthy Lifestyle.  Know what a healthy weight is for you (roughly BMI <25) and aim to maintain this   Aim for 7+ servings of fruits and vegetables daily   65-80+ fluid ounces of water or unsweet tea for healthy kidneys   Limit to max 1 drink of alcohol per day; avoid smoking/tobacco   Limit animal fats in diet for cholesterol and heart health - choose grass fed whenever available   Avoid highly processed foods, and foods high in saturated/trans fats   Aim for low stress - take time to unwind and care for your mental health   Aim for 150 min of moderate intensity exercise weekly for heart health, and weights twice weekly for bone health   Aim for 7-9 hours of sleep daily   When it comes to diets, agreement about the perfect plan isnt easy to find, even among the experts. Experts at the [employer] developed an idea known as the [employer]  Plate. Just imagine a plate divided into logical, healthy portions.   The emphasis is on diet quality:   Load up on vegetables and fruits - one-half of your plate: Aim for color and variety, and remember that potatoes dont count.   Go for whole grains - one-quarter of your plate: Whole wheat, barley, wheat berries, quinoa, oats, brown rice, and foods made with them. If you want pasta,  go with whole wheat pasta.   Protein power - one-quarter of your plate: Fish, chicken, beans, and nuts are all healthy, versatile protein sources. Limit red meat.   The diet, however, does go beyond the plate, offering a few other suggestions.   Use healthy plant oils, such as olive, canola, soy, corn, sunflower and peanut. Check the labels, and avoid partially hydrogenated oil, which have unhealthy trans fats.   If youre thirsty, drink water. Coffee and tea are good in moderation, but skip sugary drinks and limit milk and dairy products to one or two daily servings.   The type of carbohydrate in the diet is more important than the amount. Some sources of carbohydrates, such as vegetables, fruits, whole grains, and beans-are healthier than others.   Finally, stay active  Signed, Thomasene Ripple, DO  08/11/2019 2:54 PM    [facility]

## 2019-08-11 NOTE — Patient Instructions (Addendum)
Medication Instructions:  Your physician has recommended you make the following change in your medication:   INCREASE: Lipitor(atorvastatin) to 40 mg Take 1 daily( May take 4 tabs of 10 mg daily until you run out)   *If you need a refill on your cardiac medications before your next appointment, please call your pharmacy*  Lab Work: None If you have labs (blood work) drawn today and your tests are completely normal, you will receive your results only by: Marland Kitchen MyChart Message (if you have MyChart) OR . A paper copy in the mail If you have any lab test that is abnormal or we need to change your treatment, we will call you to review the results.  Testing/Procedures: NOne  Follow-Up: At [facility], you and your health needs are our priority.  As part of our continuing mission to provide you with exceptional heart care, we have created designated Provider Care Teams.  These Care Teams include your primary Cardiologist (physician) and Advanced Practice Providers (APPs -  Physician Assistants and Nurse Practitioners) who all work together to provide you with the care you need, when you need it.  Your next appointment:   3 month(s)  The format for your next appointment:   In Person  Provider:   Thomasene Ripple, DO  Other Instructions You are  Being referred to Cardiac rehab. They will contact you for appointment times and dates.

## 2019-08-25 DIAGNOSIS — G4733 Obstructive sleep apnea (adult) (pediatric): Secondary | ICD-10-CM | POA: Diagnosis not present

## 2019-08-31 ENCOUNTER — Telehealth (HOSPITAL_COMMUNITY): Payer: Self-pay | Admitting: *Deleted

## 2019-08-31 NOTE — Telephone Encounter (Signed)
Called patient today to discuss Cardiac Rehab program. No answer. Left message for him to call us back.

## 2019-08-31 NOTE — Telephone Encounter (Signed)
Patient called back today 08/31/19 and left a message stating he was doing well at this time and has decided to opt out of the program. Will cancel referral.

## 2019-09-02 ENCOUNTER — Other Ambulatory Visit (HOSPITAL_COMMUNITY): Payer: Medicare HMO

## 2019-09-15 ENCOUNTER — Other Ambulatory Visit: Payer: Self-pay | Admitting: Internal Medicine

## 2019-09-22 ENCOUNTER — Ambulatory Visit: Payer: Medicare HMO | Admitting: Internal Medicine

## 2019-09-25 DIAGNOSIS — G4733 Obstructive sleep apnea (adult) (pediatric): Secondary | ICD-10-CM | POA: Diagnosis not present

## 2019-10-01 ENCOUNTER — Other Ambulatory Visit: Payer: Self-pay | Admitting: Internal Medicine

## 2019-10-23 DIAGNOSIS — G4733 Obstructive sleep apnea (adult) (pediatric): Secondary | ICD-10-CM | POA: Diagnosis not present

## 2019-11-05 ENCOUNTER — Other Ambulatory Visit: Payer: Self-pay

## 2019-11-06 ENCOUNTER — Ambulatory Visit: Payer: Medicare HMO | Admitting: Cardiology

## 2019-11-06 ENCOUNTER — Encounter: Payer: Self-pay | Admitting: Cardiology

## 2019-11-06 VITALS — BP 122/90 | HR 89 | Ht 72.0 in | Wt 223.0 lb

## 2019-11-06 DIAGNOSIS — E781 Pure hyperglyceridemia: Secondary | ICD-10-CM | POA: Diagnosis not present

## 2019-11-06 DIAGNOSIS — I214 Non-ST elevation (NSTEMI) myocardial infarction: Secondary | ICD-10-CM | POA: Diagnosis not present

## 2019-11-06 DIAGNOSIS — I10 Essential (primary) hypertension: Secondary | ICD-10-CM

## 2019-11-06 DIAGNOSIS — E118 Type 2 diabetes mellitus with unspecified complications: Secondary | ICD-10-CM | POA: Diagnosis not present

## 2019-11-06 DIAGNOSIS — I5189 Other ill-defined heart diseases: Secondary | ICD-10-CM

## 2019-11-06 DIAGNOSIS — Z794 Long term (current) use of insulin: Secondary | ICD-10-CM | POA: Diagnosis not present

## 2019-11-06 DIAGNOSIS — I471 Supraventricular tachycardia: Secondary | ICD-10-CM | POA: Diagnosis not present

## 2019-11-06 DIAGNOSIS — E782 Mixed hyperlipidemia: Secondary | ICD-10-CM

## 2019-11-06 DIAGNOSIS — I2510 Atherosclerotic heart disease of native coronary artery without angina pectoris: Secondary | ICD-10-CM

## 2019-11-06 HISTORY — DX: Atherosclerotic heart disease of native coronary artery without angina pectoris: I25.10

## 2019-11-06 NOTE — Patient Instructions (Signed)
Medication Instructions:  No medication changes. *If you need a refill on your cardiac medications before your next appointment, please call your pharmacy*   Lab Work: None ordered If you have labs (blood work) drawn today and your tests are completely normal, you will receive your results only by: . MyChart Message (if you have MyChart) OR . A paper copy in the mail If you have any lab test that is abnormal or we need to change your treatment, we will call you to review the results.   Testing/Procedures: None ordered   Follow-Up: At [facility], you and your health needs are our priority.  As part of our continuing mission to provide you with exceptional heart care, we have created designated Provider Care Teams.  These Care Teams include your primary Cardiologist (physician) and Advanced Practice Providers (APPs -  Physician Assistants and Nurse Practitioners) who all work together to provide you with the care you need, when you need it.  We recommend signing up for the patient portal called "MyChart".  Sign up information is provided on this After Visit Summary.  MyChart is used to connect with patients for Virtual Visits (Telemedicine).  Patients are able to view lab/test results, encounter notes, upcoming appointments, etc.  Non-urgent messages can be sent to your provider as well.   To learn more about what you can do with MyChart, go to [email or web address].    Your next appointment:   4 month(s)  The format for your next appointment:   In Person  Provider:   Kardie Tobb, DO   Other Instructions NA  

## 2019-11-06 NOTE — Progress Notes (Signed)
Cardiology Office Note:    Date:  11/06/2019   ID:  Sean Horn, DOB 08-30-1960, MRN [id number]  PCP:  Pearline Cables, MD  Cardiologist:  Thomasene Ripple, DO  Electrophysiologist:  None   Referring MD: Pearline Cables, MD   The patient presents for follow-up visit.  History of Present Illness:    Sean Horn is a 59 y.o. male with a hx of hypertension, NSTEMI/coronary artery disease status post left heart cath June 26, 2019 with 80% apical LAD lesions which has been stated for medical management due to this lesion being a unsuitable candidate for PCI, NSVT seen on monitor, SVT/AVNRT status post ablation on July 23, 2019.  Today he tells me that he has been having some difficulty sleeping- he reports that he believes that this is due to his arthritic pain. HE states that due to this he has been experiencing some fatigue during the evening hours of the day.    Past Medical History:  Diagnosis Date  . Anxiety   . CAD in native artery    a. cath 06/26/2019 - 80% apical LAD >> medical management   . Chronic headaches   . Chronic neck pain   . GERD (gastroesophageal reflux disease)    occasional - OTC as needed  . History of chicken pox   . History of kidney stones   . History of shingles   . Hypertension    under control with med., had been on med. since age 28  . Insomnia 09/13/2016  . Insulin dependent diabetes mellitus    Follows with endocrinology, Dr Chestine Spore reports his last hgba1c was 7.3   . Non-insulin dependent type 2 diabetes mellitus (HCC)   . Obesity 01/30/2017  . Occipital neuralgia   . Osteoarthritis    right Horn  . PONV (postoperative nausea and vomiting)   . PONV (postoperative nausea and vomiting)   . Preventative health care 08/14/2013  . Tachycardia   . Urinary frequency 12/11/2016    Past Surgical History:  Procedure Laterality Date  . ANTERIOR CERVICAL DECOMP/DISCECTOMY FUSION N/A 01/16/2018   Procedure: ANTERIOR CERVICAL  DECOMPRESSION/DISCECTOMY FUSION CERVICAL FOUR-FIVE, CERVICAL FIVE-SIX;  Surgeon: Lisbeth Renshaw, MD;  Location: [facility] OR;  Service: Neurosurgery;  Laterality: N/A;  ANTERIOR CERVICAL DECOMPRESSION/DISCECTOMY FUSION CERVICAL FOUR-FIVE, CERVICAL FIVE-SIX  . CHOLECYSTECTOMY  1980's  . CYSTOSCOPY/RETROGRADE/URETEROSCOPY/STONE EXTRACTION WITH BASKET Right 08/27/2003   and double J stent placement  . ELECTROPHYSIOLOGY STUDY N/A 07/23/2019   Procedure: ELECTROPHYSIOLOGY STUDY;  Surgeon: Marinus Maw, MD;  Location: [facility] INVASIVE CV LAB;  Service: Cardiovascular;  Laterality: N/A;  . INGUINAL HERNIA REPAIR Left 1990's  . Horn ARTHROSCOPY Left 1980's  . Horn ARTHROSCOPY Right 2013  . Horn ARTHROSCOPY Right 06/30/2013   Procedure: RIGHT Horn ARTHROSCOPY AND SYNOVECTOMY;  Surgeon: Velna Ochs, MD;  Location: [facility];  Service: Orthopedics;  Laterality: Right;  . LEFT HEART CATH AND CORONARY ANGIOGRAPHY N/A 06/26/2019   Procedure: LEFT HEART CATH AND CORONARY ANGIOGRAPHY;  Surgeon: Marykay Lex, MD;  Location: [facility] INVASIVE CV LAB;  Service: Cardiovascular;  Laterality: N/A;  . LITHOTRIPSY     multiple, b/l  . SHOULDER ADHESION RELEASE Left ` 1998  . SHOULDER ARTHROSCOPY W/ ROTATOR CUFF REPAIR Left 10/20/2004   x 4, had to have part of clavile removed. torn rotator cuff multiple times, had to place screws   . SHOULDER ARTHROSCOPY W/ SUPERIOR LABRAL ANTERIOR POSTERIOR LESION REPAIR Left 12/22/2004  . SVT ABLATION N/A 07/23/2019  Procedure: SVT ABLATION;  Surgeon: Evans Lance, MD;  Location: [place] [facility];  Service: Cardiovascular;  Laterality: N/A;  . TOTAL Horn ARTHROPLASTY Right 12/23/2012   x3, 2 arthroscopies and a TKR,  TOTAL Horn ARTHROPLASTY;  Surgeon: Hessie Dibble, MD;  Location: [place];  Service: Orthopedics;  Laterality: Right;  DEPUY, RNFA  . WRIST SURGERY Right    x 3, torn ligaments, pins placed then infected, then fused with plate    Current  Medications: Current Meds  Medication Sig  . aspirin EC 81 MG EC tablet Take 1 tablet (81 mg total) by mouth daily.  Marland Kitchen atorvastatin (LIPITOR) 40 MG tablet Take 1 tablet (40 mg total) by mouth daily.  . clopidogrel (PLAVIX) 75 MG tablet Take 1 tablet (75 mg total) by mouth daily with breakfast.  . Coenzyme Q10 (COQ-10) 75 MG CAPS Take 75 mg by mouth daily.  . diclofenac Sodium (VOLTAREN) 1 % GEL Apply 2 g topically Nightly.  . escitalopram (LEXAPRO) 20 MG tablet Take 1 tablet by mouth once daily  . EUTHYROX 25 MCG tablet TAKE 1 TABLET BY MOUTH ONCE DAILY BEFORE BREAKFAST  . insulin aspart (NOVOLOG FLEXPEN) 100 UNIT/ML FlexPen Inject 25-35 units 3 times daily with meals  . insulin detemir (LEVEMIR) 100 UNIT/ML injection Inject 0.9 mLs (90 Units total) into the skin at bedtime.  . isosorbide mononitrate (IMDUR) 30 MG 24 hr tablet Take 1 tablet (30 mg total) by mouth daily.  . lansoprazole (PREVACID) 30 MG capsule Take 30 mg by mouth every morning.   Marland Kitchen lisinopril (ZESTRIL) 10 MG tablet Take 10 mg by mouth daily.  Marland Kitchen loratadine (CLARITIN) 10 MG tablet Take 10 mg by mouth every morning.   . metFORMIN (GLUCOPHAGE-XR) 500 MG 24 hr tablet TAKE 1 TABLET BY MOUTH TWICE DAILY WITH A MEAL  . nitroGLYCERIN (NITROSTAT) 0.4 MG SL tablet Place 1 tablet (0.4 mg total) under the tongue every 5 (five) minutes as needed.  . TRULICITY 3 CW/2.3JS SOPN Inject 3 mg into the skin once a week. Tuesdays     Allergies:   Rosuvastatin and Toradol [ketorolac tromethamine]   Social History   Socioeconomic History  . Marital status: Single    Spouse name: Not on file  . Number of children: 2  . Years of education: Not on file  . Highest education level: High school graduate  Occupational History  . Occupation: Disabled  Tobacco Use  . Smoking status: Never Smoker  . Smokeless tobacco: Current User    Types: Snuff  Substance and Sexual Activity  . Alcohol use: No  . Drug use: No  . Sexual activity: Yes  Other  Topics Concern  . Not on file  Social History Narrative   Lives at home with his father (he takes care of his father)   Right handed   Drinks 2 cups of caffeine daily   Social Determinants of Health   Financial Resource Strain:   . Difficulty of Paying Living Expenses:   Food Insecurity:   . Worried About Charity fundraiser in the Last Year:   . Arboriculturist in the Last Year:   Transportation Needs:   . Film/video editor (Medical):   Marland Kitchen Lack of Transportation (Non-Medical):   Physical Activity:   . Days of Exercise per Week:   . Minutes of Exercise per Session:   Stress:   . Feeling of Stress :   Social Connections:   . Frequency of Communication  with Friends and Family:   . Frequency of Social Gatherings with Friends and Family:   . Attends Religious Services:   . Active Member of Clubs or Organizations:   . Attends BankerClub or Organization Meetings:   Marland Kitchen. Marital Status:      Family History: The patient's family history includes Arthritis in his paternal grandmother; COPD in his mother; Diabetes in his father; Heart disease in his father, maternal grandfather, and mother; Hypertension in his father, mother, and son; Peripheral vascular disease in his father; Stroke in his father. There is no history of Colon cancer, Rectal cancer, or Stomach cancer.  ROS:   Review of Systems  Constitution: Negative for decreased appetite, fever and weight gain.  HENT: Negative for congestion, ear discharge, hoarse voice and sore throat.   Eyes: Negative for discharge, redness, vision loss in right eye and visual halos.  Cardiovascular: Negative for chest pain, dyspnea on exertion, leg swelling, orthopnea and palpitations.  Respiratory: Negative for cough, hemoptysis, shortness of breath and snoring.   Endocrine: Negative for heat intolerance and polyphagia.  Hematologic/Lymphatic: Negative for bleeding problem. Does not bruise/bleed easily.  Skin: Negative for flushing, nail changes,  rash and suspicious lesions.  Musculoskeletal: Negative for arthritis, joint pain, muscle cramps, myalgias, neck pain and stiffness.  Gastrointestinal: Negative for abdominal pain, bowel incontinence, diarrhea and excessive appetite.  Genitourinary: Negative for decreased libido, genital sores and incomplete emptying.  Neurological: Negative for brief paralysis, focal weakness, headaches and loss of balance.  Psychiatric/Behavioral: Negative for altered mental status, depression and suicidal ideas.  Allergic/Immunologic: Negative for HIV exposure and persistent infections.    EKGs/Labs/Other Studies Reviewed:    The following studies were reviewed today:   EKG:  None today  Thoracic echocardiogram 06/12/2019 1. Left ventricular ejection fraction, by visual estimation, is 55 to 60%. The left ventricle has normal function. There is moderately increased left ventricular hypertrophy. 2. Left ventricular diastolic parameters are consistent with Grade II diastolic dysfunction (pseudonormalization). 3. Global right ventricle has normal systolic function.The right ventricular size is normal. No increase in right ventricular wall thickness. 4. Left atrial size was normal. 5. Right atrial size was normal. 6. The mitral valve is grossly normal. Trace mitral valve regurgitation. 7. The tricuspid valve is grossly normal. Tricuspid valve regurgitation is trivial. 8. The aortic valve is tricuspid. Aortic valve regurgitation is not visualized. 9. The pulmonic valve was grossly normal. Pulmonic valve regurgitation is trivial. 10. Aortic dilatation noted. 11. There is mild dilatation of the ascending aorta. 12. TR signal is inadequate for assessing pulmonary artery systolic pressure. 13. The inferior vena cava is normal in size with greater than 50% respiratory variability, suggesting right atrial pressure of 3 mmHg.  Recent Labs: 06/25/2019: TSH 1.714 06/26/2019: ALT 26 07/20/2019: BUN 10;  Creatinine, Ser 0.85; Hemoglobin 14.7; Platelets 251; Potassium 3.9; Sodium 138  Recent Lipid Panel    Component Value Date/Time   CHOL 155 06/26/2019 0409   TRIG 254 (H) 06/26/2019 0409   HDL 27 (L) 06/26/2019 0409   CHOLHDL 5.7 06/26/2019 0409   VLDL 51 (H) 06/26/2019 0409   LDLCALC 77 06/26/2019 0409   LDLDIRECT 128.0 12/11/2016 1622    Physical Exam:    VS:  BP 122/90   Pulse 89   Ht 6' (1.829 m)   Wt 223 lb (101.2 kg)   SpO2 99%   BMI 30.24 kg/m     Wt Readings from Last 3 Encounters:  11/06/19 223 lb (101.2 kg)  08/11/19 222  lb (100.7 kg)  07/23/19 217 lb (98.4 kg)     GEN: Well nourished, well developed in no acute distress HEENT: Normal  NECK: No JVD; No carotid bruits LYMPHATICS: No lymphadenopathy CARDIAC: S1S2 noted,RRR, no murmurs, rubs, gallops RESPIRATORY:  Clear to auscultation without rales, wheezing or rhonchi  ABDOMEN: Soft, non-tender, non-distended, +bowel sounds, no guarding. EXTREMITIES: No edema, No cyanosis, no clubbing MUSCULOSKELETAL:  No deformity  SKIN: Warm and dry NEUROLOGIC:  Alert and oriented x 3, non-focal PSYCHIATRIC:  Normal affect, good insight  ASSESSMENT:    1. Coronary artery disease involving native coronary artery of native heart without angina pectoris   2. Essential hypertension   3. Non-ST elevation (NSTEMI) myocardial infarction (HCC)   4. Type 2 diabetes mellitus with complication, with long-term current use of insulin (HCC)   5. Mixed hyperlipidemia   6. Hypertriglyceridemia   7. Diastolic dysfunction   8. AVNRT (AV nodal re-entry tachycardia) (HCC)    PLAN:    He clinically improving from a cardiovascular standpoint.  I will continue patient on his DAPT with aspirin and clopidogrel.  Atorvastatin 40 mg along with coenzyme 10 and Imdur 30 mg daily.  Coronary disease stable no symptoms of angina.  Hypertension-blood pressure susceptible we will continue the current regimen.  AVNRT status post ablation continue  patient on his current beta-blocker metoprolol succinate 50 mg daily.  Hyperlipidemia/hypertriglyceridemia continue atorvastatin 40 mg daily.  Plan to repeat his lipid profile at his next visit.  Diabetes mellitus-insulin per his endocrinologist.  Insomnia-I did speak to the patient to discuss with his PCP for need of any sleep aids.  The patient is in agreement with the above plan. The patient left the office in stable condition.  The patient will follow up in 6 months or sooner if needed.   Medication Adjustments/Labs and Tests Ordered: Current medicines are reviewed at length with the patient today.  Concerns regarding medicines are outlined above.  No orders of the defined types were placed in this encounter.  No orders of the defined types were placed in this encounter.   Patient Instructions  Medication Instructions:  No medication changes *If you need a refill on your cardiac medications before your next appointment, please call your pharmacy*   Lab Work: None ordered If you have labs (blood work) drawn today and your tests are completely normal, you will receive your results only by: Marland Kitchen MyChart Message (if you have MyChart) OR . A paper copy in the mail If you have any lab test that is abnormal or we need to change your treatment, we will call you to review the results.   Testing/Procedures: None ordered   Follow-Up: At [facility], you and your health needs are our priority.  As part of our continuing mission to provide you with exceptional heart care, we have created designated Provider Care Teams.  These Care Teams include your primary Cardiologist (physician) and Advanced Practice Providers (APPs -  Physician Assistants and Nurse Practitioners) who all work together to provide you with the care you need, when you need it.  We recommend signing up for the patient portal called "MyChart".  Sign up information is provided on this After Visit Summary.  MyChart is used  to connect with patients for Virtual Visits (Telemedicine).  Patients are able to view lab/test results, encounter notes, upcoming appointments, etc.  Non-urgent messages can be sent to your provider as well.   To learn more about what you can do with MyChart, go  to [email or web address].    Your next appointment:   4 month(s)  The format for your next appointment:   In Person  Provider:   Thomasene Ripple, DO   Other Instructions NA     Adopting a Healthy Lifestyle.  Know what a healthy weight is for you (roughly BMI <25) and aim to maintain this   Aim for 7+ servings of fruits and vegetables daily   65-80+ fluid ounces of water or unsweet tea for healthy kidneys   Limit to max 1 drink of alcohol per day; avoid smoking/tobacco   Limit animal fats in diet for cholesterol and heart health - choose grass fed whenever available   Avoid highly processed foods, and foods high in saturated/trans fats   Aim for low stress - take time to unwind and care for your mental health   Aim for 150 min of moderate intensity exercise weekly for heart health, and weights twice weekly for bone health   Aim for 7-9 hours of sleep daily   When it comes to diets, agreement about the perfect plan isnt easy to find, even among the experts. Experts at the [facility] [employer] developed an idea known as the Healthy Eating Plate. Just imagine a plate divided into logical, healthy portions.   The emphasis is on diet quality:   Load up on vegetables and fruits - one-half of your plate: Aim for color and variety, and remember that potatoes dont count.   Go for whole grains - one-quarter of your plate: Whole wheat, barley, wheat berries, quinoa, oats, brown rice, and foods made with them. If you want pasta, go with whole wheat pasta.   Protein power - one-quarter of your plate: Fish, chicken, beans, and nuts are all healthy, versatile protein sources. Limit red meat.   The diet, however,  does go beyond the plate, offering a few other suggestions.   Use healthy plant oils, such as olive, canola, soy, corn, sunflower and peanut. Check the labels, and avoid partially hydrogenated oil, which have unhealthy trans fats.   If youre thirsty, drink water. Coffee and tea are good in moderation, but skip sugary drinks and limit milk and dairy products to one or two daily servings.   The type of carbohydrate in the diet is more important than the amount. Some sources of carbohydrates, such as vegetables, fruits, whole grains, and beans-are healthier than others.   Finally, stay active  Signed, Thomasene Ripple, DO  11/06/2019 2:02 PM    [facility]

## 2019-11-09 ENCOUNTER — Ambulatory Visit: Payer: Medicare HMO | Admitting: Internal Medicine

## 2019-11-09 ENCOUNTER — Encounter: Payer: Self-pay | Admitting: Internal Medicine

## 2019-11-09 ENCOUNTER — Other Ambulatory Visit: Payer: Self-pay

## 2019-11-09 VITALS — BP 120/90 | HR 100 | Ht 72.0 in | Wt 222.0 lb

## 2019-11-09 DIAGNOSIS — E785 Hyperlipidemia, unspecified: Secondary | ICD-10-CM | POA: Diagnosis not present

## 2019-11-09 DIAGNOSIS — E669 Obesity, unspecified: Secondary | ICD-10-CM

## 2019-11-09 DIAGNOSIS — Z794 Long term (current) use of insulin: Secondary | ICD-10-CM

## 2019-11-09 DIAGNOSIS — E118 Type 2 diabetes mellitus with unspecified complications: Secondary | ICD-10-CM

## 2019-11-09 LAB — POCT GLYCOSYLATED HEMOGLOBIN (HGB A1C): Hemoglobin A1C: 12.7 % — AB (ref 4.0–5.6)

## 2019-11-09 MED ORDER — TRULICITY 4.5 MG/0.5ML ~~LOC~~ SOAJ: 4.5000 mg | SUBCUTANEOUS | 3 refills | Status: DC

## 2019-11-09 MED ORDER — METFORMIN HCL ER 500 MG PO TB24: ORAL_TABLET | ORAL | 3 refills | Status: DC

## 2019-11-09 NOTE — Patient Instructions (Addendum)
Please increase: - Levemir 90 units at bedtime  Please increase: - Metformin ER 1000 mg 2x a day - Humalog: 25 units for a smaller meal 30 units for a regular meal 35 units for a larger meal - Trulicity 4.5 mg weekly in am  Start checking sugars 4x a day, before meals.  Please return in 3 months with your sugar log.

## 2019-11-09 NOTE — Progress Notes (Signed)
Patient ID: Sean Horn, male   DOB: 1960-12-06, 59 y.o.   MRN: [id number]   This visit occurred during the SARS-CoV-2 public health emergency.  Safety protocols were in place, including screening questions prior to the visit, additional usage of staff PPE, and extensive cleaning of exam room while observing appropriate contact time as indicated for disinfecting solutions.   HPI: Sean Horn is a 59 y.o.-year-old male, returning for follow-up for DM2, dx in 2013, insulin-dependent since 2017, uncontrolled, with complications (CAD - s/p NSTEMI, SVT, dCHF, h/o TIA). He was previously seen by Dr. Chestine Spore.  Last visit 5.5 months ago.  Since last visit, he had cardiac investigation for palpitations/syncope (SVT).  He had a cardiac ablation. He had a cardiac catheterization 06/2019 which showed coronary artery disease (80% LAD obstruction unsuitable for PCI -only amenable to medical management).  Reviewed HbA1c: Lab Results  Component Value Date   HGBA1C 8.0 (H) 06/26/2019   HGBA1C 8.7 (A) 05/19/2019   HGBA1C 8.8 (H) 11/03/2018   He is on: - Metformin ER 500 mg 2x a day - Trulicity 1.5 >> 3 mg weekly in am - Levemir 75 >> 90 units at bedtime - Humalog: 10-15 units. Not using:  Pt is not checking sugars... 2 mo ago: - am: 298-400 >> 180-330 >> 60, 130-140, 160 >> 80 x1 - 2h after b'fast: n/c >> 336 >> n/c  - before lunch: 80 x1  >> >600 >> n/c >> 60 x1 - 2h after lunch: n/c - before dinner: n/c - 2h after dinner: n/c - bedtime: 200-300s >> n/c - nighttime: n/c Lowest sugar was 80 >> 200s >> 180 >> 60 x2 >> 80; he has hypoglycemia awareness in the 80s. Highest sugar was 400s >> HI >> 350 >> 200s >> 200.  Glucometer: One Touch Ultra  Pt's meals are: - Breakfast: cereal: cheerios + 2% milk - Lunch: sandwich  - Dinner: meat + veggies and less starches - Snacks: after dinner - nuts; granola; sugar-free cookies,  Midnight snack: e.g. banana   -No CKD, last BUN/creatinine:  Lab  Results  Component Value Date   BUN 10 07/20/2019   BUN 11 06/26/2019   CREATININE 0.85 07/20/2019   CREATININE 1.10 06/26/2019  On lisinopril 10. -+ HL; last set of lipids: Lab Results  Component Value Date   CHOL 155 06/26/2019   HDL 27 (L) 06/26/2019   LDLCALC 77 06/26/2019   LDLDIRECT 128.0 12/11/2016   TRIG 254 (H) 06/26/2019   CHOLHDL 5.7 06/26/2019   On Crestor 10 - last eye exam was in 10/2018: No DR reportedly -No numbness and tingling in his feet.  He has family history of diabetes in father.  ROS: Constitutional: no weight gain/no weight loss, no fatigue, no subjective hyperthermia, no subjective hypothermia Eyes: no blurry vision, no xerophthalmia ENT: no sore throat, no nodules palpated in neck, no dysphagia, no odynophagia, no hoarseness Cardiovascular: no CP/no SOB/+ palpitations/no leg swelling Respiratory: no cough/no SOB/no wheezing Gastrointestinal: no N/no V/no D/no C/no acid reflux Musculoskeletal: no muscle aches/+ joint aches (shoulder) Skin: no rashes, no hair loss Neurological: + (hereditary) tremors/no numbness/no tingling/no dizziness, + loss of consciousness  I reviewed pt's medications, allergies, PMH, social hx, family hx, and changes were documented in the history of present illness. Otherwise, unchanged from my initial visit note.  Past Medical History:  Diagnosis Date  . Anxiety   . CAD in native artery    a. cath 06/26/2019 - 80% apical LAD >> medical  management   . Chronic headaches   . Chronic neck pain   . GERD (gastroesophageal reflux disease)    occasional - OTC as needed  . History of chicken pox   . History of kidney stones   . History of shingles   . Hypertension    under control with med., had been on med. since age 59  . Insomnia 09/13/2016  . Insulin dependent diabetes mellitus    Follows with endocrinology, Dr Chestine Spore reports his last hgba1c was 7.3   . Non-insulin dependent type 2 diabetes mellitus (HCC)   . Obesity  01/30/2017  . Occipital neuralgia   . Osteoarthritis    right knee  . PONV (postoperative nausea and vomiting)   . PONV (postoperative nausea and vomiting)   . Preventative health care 08/14/2013  . Tachycardia   . Urinary frequency 12/11/2016   Past Surgical History:  Procedure Laterality Date  . ANTERIOR CERVICAL DECOMP/DISCECTOMY FUSION N/A 01/16/2018   Procedure: ANTERIOR CERVICAL DECOMPRESSION/DISCECTOMY FUSION CERVICAL FOUR-FIVE, CERVICAL FIVE-SIX;  Surgeon: Lisbeth Renshaw, MD;  Location: [facility] OR;  Service: Neurosurgery;  Laterality: N/A;  ANTERIOR CERVICAL DECOMPRESSION/DISCECTOMY FUSION CERVICAL FOUR-FIVE, CERVICAL FIVE-SIX  . CHOLECYSTECTOMY  1980's  . CYSTOSCOPY/RETROGRADE/URETEROSCOPY/STONE EXTRACTION WITH BASKET Right 08/27/2003   and double J stent placement  . ELECTROPHYSIOLOGY STUDY N/A 07/23/2019   Procedure: ELECTROPHYSIOLOGY STUDY;  Surgeon: Marinus Maw, MD;  Location: [facility] INVASIVE CV LAB;  Service: Cardiovascular;  Laterality: N/A;  . INGUINAL HERNIA REPAIR Left 1990's  . KNEE ARTHROSCOPY Left 1980's  . KNEE ARTHROSCOPY Right 2013  . KNEE ARTHROSCOPY Right 06/30/2013   Procedure: RIGHT KNEE ARTHROSCOPY AND SYNOVECTOMY;  Surgeon: Velna Ochs, MD;  Location: [facility];  Service: Orthopedics;  Laterality: Right;  . LEFT HEART CATH AND CORONARY ANGIOGRAPHY N/A 06/26/2019   Procedure: LEFT HEART CATH AND CORONARY ANGIOGRAPHY;  Surgeon: Marykay Lex, MD;  Location: [facility] INVASIVE CV LAB;  Service: Cardiovascular;  Laterality: N/A;  . LITHOTRIPSY     multiple, b/l  . SHOULDER ADHESION RELEASE Left ` 1998  . SHOULDER ARTHROSCOPY W/ ROTATOR CUFF REPAIR Left 10/20/2004   x 4, had to have part of clavile removed. torn rotator cuff multiple times, had to place screws   . SHOULDER ARTHROSCOPY W/ SUPERIOR LABRAL ANTERIOR POSTERIOR LESION REPAIR Left 12/22/2004  . SVT ABLATION N/A 07/23/2019   Procedure: SVT ABLATION;  Surgeon: Marinus Maw, MD;  Location: [facility]  INVASIVE CV LAB;  Service: Cardiovascular;  Laterality: N/A;  . TOTAL KNEE ARTHROPLASTY Right 12/23/2012   x3, 2 arthroscopies and a TKR,  TOTAL KNEE ARTHROPLASTY;  Surgeon: Velna Ochs, MD;  Location: [facility] OR;  Service: Orthopedics;  Laterality: Right;  DEPUY, RNFA  . WRIST SURGERY Right    x 3, torn ligaments, pins placed then infected, then fused with plate   Social History   Social History  . Marital status: Single    Spouse name: N/A  . Number of children: 2   Occupational History  . Retired, disability after his R knee sx.   Social History Main Topics  . Smoking status: Never Smoker  . Smokeless tobacco: Current User    Types: Snuff  . Alcohol use Yes     Comment: occasionally  . Drug use: No  . Sexual activity: Yes     Comment: currently applying for disability due to knee, lives with girfriend, no dietary restrictions, previously Games developer   Current Outpatient Medications on File Prior to Visit  Medication Sig Dispense Refill  . aspirin EC 81 MG EC tablet Take 1 tablet (81 mg total) by mouth daily. 90 tablet 3  . atorvastatin (LIPITOR) 40 MG tablet Take 1 tablet (40 mg total) by mouth daily. 90 tablet 3  . clopidogrel (PLAVIX) 75 MG tablet Take 1 tablet (75 mg total) by mouth daily with breakfast. 30 tablet 6  . Coenzyme Q10 (COQ-10) 75 MG CAPS Take 75 mg by mouth daily.  0  . diclofenac Sodium (VOLTAREN) 1 % GEL Apply 2 g topically Nightly.    . escitalopram (LEXAPRO) 20 MG tablet Take 1 tablet by mouth once daily 90 tablet 0  . EUTHYROX 25 MCG tablet TAKE 1 TABLET BY MOUTH ONCE DAILY BEFORE BREAKFAST 90 tablet 0  . insulin aspart (NOVOLOG FLEXPEN) 100 UNIT/ML FlexPen Inject 25-35 units 3 times daily with meals 30 mL 5  . insulin detemir (LEVEMIR) 100 UNIT/ML injection Inject 0.9 mLs (90 Units total) into the skin at bedtime. 81 mL 0  . isosorbide mononitrate (IMDUR) 30 MG 24 hr tablet Take 1 tablet (30 mg total) by mouth daily. 30 tablet 6  . lansoprazole  (PREVACID) 30 MG capsule Take 30 mg by mouth every morning.     Marland Kitchen lisinopril (ZESTRIL) 10 MG tablet Take 10 mg by mouth daily.    Marland Kitchen loratadine (CLARITIN) 10 MG tablet Take 10 mg by mouth every morning.     . metFORMIN (GLUCOPHAGE-XR) 500 MG 24 hr tablet TAKE 1 TABLET BY MOUTH TWICE DAILY WITH A MEAL 180 tablet 1  . metoprolol succinate (TOPROL-XL) 50 MG 24 hr tablet Take 1 tablet (50 mg total) by mouth daily. Take with or immediately following a meal. 90 tablet 3  . nitroGLYCERIN (NITROSTAT) 0.4 MG SL tablet Place 1 tablet (0.4 mg total) under the tongue every 5 (five) minutes as needed. 25 tablet 12  . TRULICITY 3 AT/5.5DD SOPN Inject 3 mg into the skin once a week. Tuesdays     No current facility-administered medications on file prior to visit.   Allergies  Allergen Reactions  . Rosuvastatin Other (See Comments)  . Toradol [Ketorolac Tromethamine] Itching   Family History  Problem Relation Age of Onset  . COPD Mother        smoker  . Hypertension Mother   . Heart disease Mother        CHF  . Diabetes Father   . Heart disease Father        CABG in 56's  . Stroke Father   . Hypertension Father   . Peripheral vascular disease Father   . Hypertension Son        tachycardia  . Heart disease Maternal Grandfather   . Arthritis Paternal Grandmother   . Colon cancer Neg Hx   . Rectal cancer Neg Hx   . Stomach cancer Neg Hx    PE: BP 120/90   Pulse 100   Ht 6' (1.829 m)   Wt 222 lb (100.7 kg)   SpO2 95%   BMI 30.11 kg/m  Wt Readings from Last 3 Encounters:  11/09/19 222 lb (100.7 kg)  11/06/19 223 lb (101.2 kg)  08/11/19 222 lb (100.7 kg)   Constitutional: overweight, in NAD Eyes: PERRLA, EOMI, no exophthalmos ENT: moist mucous membranes, no thyromegaly, no cervical lymphadenopathy Cardiovascular: tachycardia, RR, No MRG Respiratory: CTA B Gastrointestinal: abdomen soft, NT, ND, BS+ Musculoskeletal: no deformities, strength intact in all 4 Skin: moist, warm, no  rashes Neurological: + tremor with outstretched  hands, DTR normal in all 4  ASSESSMENT: 1. DM2, insulin-dependent, uncontrolled, without long term complications, but significant hyperglycemia  CAD - s/p NSTEMI  SVT  dCHF  h/o TIA  2. HL  3.  Obesity class I  PLAN:  1. Patient with history of uncontrolled type 2 diabetes, with improving HbA1c, but still above goal.  Latest HbA1c was 8.0%, decreased from 8.7% at last visit.  At that time, she was checking sugars in the morning and they were almost at goal, but likely increasing throughout the day along with his meals, although he was not checking later in the day.  Therefore, we increased his Trulicity dose.  Of note, we cannot use SGLT 2 inhibitors due to frequent urination.  We discussed in the past about the potential use of U500 insulin, but we did not start this yet. -At this visit, he is not checking sugars but we checked his HbA1c and this was: 12.7% (MUCH higher)  -We discussed that this is dangerously high, especially in the setting of his recent cardiovascular events.  She cannot stop checking his blood sugars and he also make sure that he is controlling his diet.  We discussed about reducing fatty foods, fried foods, and fast food.  Also, he needs to start checking sugars 4 times a day (he will try to get a freestyle libre CGM -he would like to pay out-of-pocket for this). -Also, at this visit, he is not taking the Humalog dose recommended at last visit, but only approximately half of the doses.  We will increase the Humalog doses, also, will increase the Metformin ER and the Trulicity doses further, since he has no GI side effects from these medications.  We did discuss about maybe retrying an SGLT2 inhibitor, but for now, in the setting of such high blood sugars, I would like to avoid this.  This is definitely an option for next visit. -I suggested to:  Patient Instructions  Please increase: - Levemir 90 units at  bedtime  Please increase: - Metformin ER 1000 mg 2x a day - Humalog: 25 units for a smaller meal 30 units for a regular meal 35 units for a larger meal - Trulicity 4.5 mg weekly in am  Start checking sugars 4x a day, before meals.  Please return in 3 months with your sugar log.   - advised to check sugars at different times of the day - 4x a day, rotating check times - advised for yearly eye exams >> he is UTD - return to clinic in 3-4 months  2. HL  -Reviewed latest lipid panel from 06/2019: LDL above goal of less than 70, HDL low, check size high: Lab Results  Component Value Date   CHOL 155 06/26/2019   HDL 27 (L) 06/26/2019   LDLCALC 77 06/26/2019   LDLDIRECT 128.0 12/11/2016   TRIG 254 (H) 06/26/2019   CHOLHDL 5.7 06/26/2019  -Continues Crestor without side effects  3.  Obesity class I -We will continue the higher dose Trulicity, which should also help with weight loss.  We will increase the dose today. -At last visit he lost 6 pounds from the previous visit, he lost a net 2-3 lbs since last visit  Carlus Pavlov, MD PhD [facility] Endocrinology

## 2019-11-09 NOTE — Addendum Note (Signed)
Addended by: Darliss Ridgel I on: 11/09/2019 09:28 AM   Modules accepted: Orders

## 2019-11-22 ENCOUNTER — Other Ambulatory Visit: Payer: Self-pay | Admitting: Family Medicine

## 2019-12-02 ENCOUNTER — Other Ambulatory Visit: Payer: Self-pay | Admitting: Family Medicine

## 2019-12-02 DIAGNOSIS — F419 Anxiety disorder, unspecified: Secondary | ICD-10-CM

## 2019-12-14 ENCOUNTER — Other Ambulatory Visit: Payer: Self-pay

## 2019-12-14 MED ORDER — INSULIN DETEMIR 100 UNIT/ML ~~LOC~~ SOLN: 90.0000 [IU] | Freq: Every day | SUBCUTANEOUS | 1 refills | Status: DC

## 2019-12-31 ENCOUNTER — Other Ambulatory Visit: Payer: Self-pay

## 2019-12-31 MED ORDER — CLOPIDOGREL BISULFATE 75 MG PO TABS: 75.0000 mg | ORAL_TABLET | Freq: Every day | ORAL | 6 refills | Status: DC

## 2020-02-11 ENCOUNTER — Ambulatory Visit: Payer: Medicare HMO | Admitting: Internal Medicine

## 2020-03-22 DIAGNOSIS — G8929 Other chronic pain: Secondary | ICD-10-CM | POA: Insufficient documentation

## 2020-03-22 DIAGNOSIS — M542 Cervicalgia: Secondary | ICD-10-CM | POA: Insufficient documentation

## 2020-03-22 DIAGNOSIS — E119 Type 2 diabetes mellitus without complications: Secondary | ICD-10-CM | POA: Insufficient documentation

## 2020-03-22 DIAGNOSIS — R112 Nausea with vomiting, unspecified: Secondary | ICD-10-CM | POA: Insufficient documentation

## 2020-03-22 DIAGNOSIS — R519 Headache, unspecified: Secondary | ICD-10-CM | POA: Insufficient documentation

## 2020-03-22 DIAGNOSIS — I2510 Atherosclerotic heart disease of native coronary artery without angina pectoris: Secondary | ICD-10-CM | POA: Insufficient documentation

## 2020-03-22 DIAGNOSIS — E1129 Type 2 diabetes mellitus with other diabetic kidney complication: Secondary | ICD-10-CM | POA: Insufficient documentation

## 2020-03-23 ENCOUNTER — Other Ambulatory Visit: Payer: Self-pay

## 2020-03-23 ENCOUNTER — Ambulatory Visit: Payer: Medicare HMO | Admitting: Cardiology

## 2020-03-23 ENCOUNTER — Encounter: Payer: Self-pay | Admitting: Cardiology

## 2020-03-23 ENCOUNTER — Ambulatory Visit (INDEPENDENT_AMBULATORY_CARE_PROVIDER_SITE_OTHER): Payer: Medicare HMO

## 2020-03-23 ENCOUNTER — Other Ambulatory Visit: Payer: Self-pay | Admitting: Family Medicine

## 2020-03-23 VITALS — BP 114/86 | HR 92 | Ht 72.0 in | Wt 216.0 lb

## 2020-03-23 DIAGNOSIS — I2510 Atherosclerotic heart disease of native coronary artery without angina pectoris: Secondary | ICD-10-CM

## 2020-03-23 DIAGNOSIS — R55 Syncope and collapse: Secondary | ICD-10-CM

## 2020-03-23 DIAGNOSIS — E781 Pure hyperglyceridemia: Secondary | ICD-10-CM | POA: Diagnosis not present

## 2020-03-23 DIAGNOSIS — G459 Transient cerebral ischemic attack, unspecified: Secondary | ICD-10-CM | POA: Diagnosis not present

## 2020-03-23 DIAGNOSIS — E782 Mixed hyperlipidemia: Secondary | ICD-10-CM | POA: Diagnosis not present

## 2020-03-23 DIAGNOSIS — I10 Essential (primary) hypertension: Secondary | ICD-10-CM

## 2020-03-23 DIAGNOSIS — F419 Anxiety disorder, unspecified: Secondary | ICD-10-CM

## 2020-03-23 DIAGNOSIS — I5189 Other ill-defined heart diseases: Secondary | ICD-10-CM | POA: Diagnosis not present

## 2020-03-23 MED ORDER — ISOSORBIDE MONONITRATE ER 30 MG PO TB24: 30.0000 mg | ORAL_TABLET | Freq: Every day | ORAL | 6 refills | Status: DC

## 2020-03-23 NOTE — Progress Notes (Signed)
Cardiology Office Note:    Date:  03/23/2020   ID:  Sean Horn, DOB 09-18-1960, MRN [id number]  PCP:  Pearline Cables, MD  Cardiologist:  Thomasene Ripple, DO  Electrophysiologist:  None   Referring MD: Pearline Cables, MD   Chief Complaint  Patient presents with  . Follow-up  . Loss of Consciousness    History of Present Illness:    Sean Horn a 58 y.o.malewith a hx of hypertension, NSTEMI/coronary artery disease status post left heart cath June 26, 2019 with 80% apical LAD lesions which has been stated for medical management due to this lesion being a unsuitable candidate for PCI, NSVT seen on monitor, SVT/AVNRT status post ablation on July 23, 2019.  I saw the patient in April 2021 at that time he seemed to be clinically improving I continue to patient his medication for coronary artery disease.  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  The patient tells me since his last visit he has been doing well however in the last 3 weeks he experienced a syncopal event.  He noted that he was on.  He usually does not sleep at night.  He therefore woke up from the bed was walking to his living room when he does not remember what exactly happened.  He woke up to himself being on the floor.  He is not sure how long he was down for and reported this was all abrupt.  He did not go to the emergency department to be evaluated.  Fortunately he has not had any recurrent symptoms.  Past Medical History:  Diagnosis Date  . Anxiety   . CAD in native artery    a. cath 06/26/2019 - 80% apical LAD >> medical management   . Chronic headaches   . Chronic neck pain   . Coronary artery disease involving native coronary artery of native heart without angina pectoris 11/06/2019  . GERD (gastroesophageal reflux disease)    occasional - OTC as needed  . History of chicken pox   . History of kidney stones   . History of shingles    . Hypertension    under control with med., had been on med. since age 49  . Insomnia 09/13/2016  . Insulin dependent diabetes mellitus    Follows with endocrinology, Dr Chestine Spore reports his last hgba1c was 7.3   . Non-insulin dependent type 2 diabetes mellitus (HCC)   . Non-ST elevation (NSTEMI) myocardial infarction (HCC) 06/26/2019  . Obesity 01/30/2017  . Occipital neuralgia   . Osteoarthritis    right Horn  . PONV (postoperative nausea and vomiting)   . PONV (postoperative nausea and vomiting)   . Preventative health care 08/14/2013  . 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 SVT (supraventricular tachycardia) (HCC) 06/26/2019  . Tachycardia   . TIA (transient ischemic attack) 11/03/2018  . Type 2 diabetes mellitus with complication, with long-term current use of insulin (HCC)    Follows with endocrinology, Dr Chestine Spore reports his last hgba1c was 7.3   . Urinary frequency 12/11/2016    Past Surgical History:  Procedure Laterality Date  . ANTERIOR CERVICAL DECOMP/DISCECTOMY FUSION N/A 01/16/2018   Procedure: ANTERIOR CERVICAL DECOMPRESSION/DISCECTOMY FUSION CERVICAL FOUR-FIVE, CERVICAL FIVE-SIX;  Surgeon: Lisbeth Renshaw, MD;  Location: [facility] OR;  Service: Neurosurgery;  Laterality: N/A;  ANTERIOR CERVICAL DECOMPRESSION/DISCECTOMY FUSION CERVICAL FOUR-FIVE, CERVICAL FIVE-SIX  . CHOLECYSTECTOMY  1980's  . CYSTOSCOPY/RETROGRADE/URETEROSCOPY/STONE  EXTRACTION WITH BASKET Right 08/27/2003   and double J stent placement  . ELECTROPHYSIOLOGY STUDY N/A 07/23/2019   Procedure: ELECTROPHYSIOLOGY STUDY;  Surgeon: Marinus Maw, MD;  Location: [facility] INVASIVE CV LAB;  Service: Cardiovascular;  Laterality: N/A;  . INGUINAL HERNIA REPAIR Left 1990's  . Horn ARTHROSCOPY Left 1980's  . Horn ARTHROSCOPY Right 2013  . Horn ARTHROSCOPY Right 06/30/2013   Procedure: RIGHT Horn ARTHROSCOPY AND SYNOVECTOMY;  Surgeon: Velna Ochs, MD;  Location: [facility];  Service: Orthopedics;   Laterality: Right;  . LEFT HEART CATH AND CORONARY ANGIOGRAPHY N/A 06/26/2019   Procedure: LEFT HEART CATH AND CORONARY ANGIOGRAPHY;  Surgeon: Marykay Lex, MD;  Location: [facility] INVASIVE CV LAB;  Service: Cardiovascular;  Laterality: N/A;  . LITHOTRIPSY     multiple, b/l  . SHOULDER ADHESION RELEASE Left ` 1998  . SHOULDER ARTHROSCOPY W/ ROTATOR CUFF REPAIR Left 10/20/2004   x 4, had to have part of clavile removed. torn rotator cuff multiple times, had to place screws   . SHOULDER ARTHROSCOPY W/ SUPERIOR LABRAL ANTERIOR POSTERIOR LESION REPAIR Left 12/22/2004  . SVT ABLATION N/A 07/23/2019   Procedure: SVT ABLATION;  Surgeon: Marinus Maw, MD;  Location: [facility] INVASIVE CV LAB;  Service: Cardiovascular;  Laterality: N/A;  . TOTAL Horn ARTHROPLASTY Right 12/23/2012   x3, 2 arthroscopies and a TKR,  TOTAL Horn ARTHROPLASTY;  Surgeon: Velna Ochs, MD;  Location: [facility] OR;  Service: Orthopedics;  Laterality: Right;  DEPUY, RNFA  . WRIST SURGERY Right    x 3, torn ligaments, pins placed then infected, then fused with plate    Current Medications: Current Meds  Medication Sig  . aspirin EC 81 MG EC tablet Take 1 tablet (81 mg total) by mouth daily.  Marland Kitchen atorvastatin (LIPITOR) 40 MG tablet Take 1 tablet (40 mg total) by mouth daily.  . clopidogrel (PLAVIX) 75 MG tablet Take 1 tablet (75 mg total) by mouth daily with breakfast.  . Coenzyme Q10 (COQ-10) 75 MG CAPS Take 75 mg by mouth daily.  . diclofenac Sodium (VOLTAREN) 1 % GEL Apply 2 g topically Nightly.  . Dulaglutide (TRULICITY) 4.5 MG/0.5ML SOPN Inject 4.5 mg as directed once a week.  . escitalopram (LEXAPRO) 20 MG tablet Take 1 tablet by mouth once daily  . EUTHYROX 25 MCG tablet TAKE 1 TABLET BY MOUTH ONCE DAILY BEFORE BREAKFAST  . insulin aspart (NOVOLOG FLEXPEN) 100 UNIT/ML FlexPen Inject 25-35 units 3 times daily with meals  . insulin detemir (LEVEMIR) 100 UNIT/ML injection Inject 0.9 mLs (90 Units total) into the skin at bedtime.  .  isosorbide mononitrate (IMDUR) 30 MG 24 hr tablet Take 1 tablet (30 mg total) by mouth daily.  . lansoprazole (PREVACID) 30 MG capsule Take 30 mg by mouth every morning.   Marland Kitchen levothyroxine (SYNTHROID) 25 MCG tablet TAKE 1 TABLET BY MOUTH ONCE DAILY BEFORE BREAKFAST  . lisinopril (ZESTRIL) 10 MG tablet Take 1 tablet by mouth once daily  . loratadine (CLARITIN) 10 MG tablet Take 10 mg by mouth every morning.   . metFORMIN (GLUCOPHAGE-XR) 500 MG 24 hr tablet TAKE 2 TABLETS BY MOUTH TWICE DAILY WITH MEALS  . metoprolol succinate (TOPROL-XL) 50 MG 24 hr tablet Take 1 tablet (50 mg total) by mouth daily. Take with or immediately following a meal.  . nitroGLYCERIN (NITROSTAT) 0.4 MG SL tablet Place 1 tablet (0.4 mg total) under the tongue every 5 (five) minutes as needed.  . [DISCONTINUED] isosorbide mononitrate (IMDUR) 30  MG 24 hr tablet Take 1 tablet (30 mg total) by mouth daily.     Allergies:   Rosuvastatin and Toradol [ketorolac tromethamine]   Social History   Socioeconomic History  . Marital status: Single    Spouse name: Not on file  . Number of children: 2  . Years of education: Not on file  . Highest education level: High school graduate  Occupational History  . Occupation: Disabled  Tobacco Use  . Smoking status: Never Smoker  . Smokeless tobacco: Current User    Types: Snuff  Vaping Use  . Vaping Use: Never used  Substance and Sexual Activity  . Alcohol use: No  . Drug use: No  . Sexual activity: Yes  Other Topics Concern  . Not on file  Social History Narrative   Lives at home with his father (he takes care of his father)   Right handed   Drinks 2 cups of caffeine daily   Social Determinants of Health   Financial Resource Strain:   . Difficulty of Paying Living Expenses:   Food Insecurity:   . Worried About Programme researcher, broadcasting/film/videounning Out of Food in the Last Year:   . Baristaan Out of Food in the Last Year:   Transportation Needs:   . Freight forwarderLack of Transportation (Medical):   Marland Kitchen. Lack of  Transportation (Non-Medical):   Physical Activity:   . Days of Exercise per Week:   . Minutes of Exercise per Session:   Stress:   . Feeling of Stress :   Social Connections:   . Frequency of Communication with Friends and Family:   . Frequency of Social Gatherings with Friends and Family:   . Attends Religious Services:   . Active Member of Clubs or Organizations:   . Attends BankerClub or Organization Meetings:   Marland Kitchen. Marital Status:      Family History: The patient's family history includes Arthritis in his paternal grandmother; COPD in his mother; Diabetes in his father; Heart disease in his father, maternal grandfather, and mother; Hypertension in his father, mother, and son; Peripheral vascular disease in his father; Stroke in his father. There is no history of Colon cancer, Rectal cancer, or Stomach cancer.  ROS:   Review of Systems  Constitution: Negative for decreased appetite, fever and weight gain.  HENT: Negative for congestion, ear discharge, hoarse voice and sore throat.   Eyes: Negative for discharge, redness, vision loss in right eye and visual halos.  Cardiovascular: Report recent syncope.  Negative for chest pain, dyspnea on exertion, leg swelling, orthopnea and palpitations.  Respiratory: Negative for cough, hemoptysis, shortness of breath and snoring.   Endocrine: Negative for heat intolerance and polyphagia.  Hematologic/Lymphatic: Negative for bleeding problem. Does not bruise/bleed easily.  Skin: Negative for flushing, nail changes, rash and suspicious lesions.  Musculoskeletal: Negative for arthritis, joint pain, muscle cramps, myalgias, neck pain and stiffness.  Gastrointestinal: Negative for abdominal pain, bowel incontinence, diarrhea and excessive appetite.  Genitourinary: Negative for decreased libido, genital sores and incomplete emptying.  Neurological: Negative for brief paralysis, focal weakness, headaches and loss of balance.  Psychiatric/Behavioral: Negative  for altered mental status, depression and suicidal ideas.  Allergic/Immunologic: Negative for HIV exposure and persistent infections.    EKGs/Labs/Other Studies Reviewed:    The following studies were reviewed today:   EKG: None today    Recent Labs: 06/25/2019: TSH 1.714 06/26/2019: ALT 26 07/20/2019: BUN 10; Creatinine, Ser 0.85; Hemoglobin 14.7; Platelets 251; Potassium 3.9; Sodium 138  Recent Lipid Panel  Component Value Date/Time   CHOL 155 06/26/2019 0409   TRIG 254 (H) 06/26/2019 0409   HDL 27 (L) 06/26/2019 0409   CHOLHDL 5.7 06/26/2019 0409   VLDL 51 (H) 06/26/2019 0409   LDLCALC 77 06/26/2019 0409   LDLDIRECT 128.0 12/11/2016 1622    Physical Exam:    VS:  BP 114/86 (BP Location: Left Arm, Patient Position: Sitting, Cuff Size: Normal)   Pulse 92   Ht 6' (1.829 m)   Wt 216 lb (98 kg)   SpO2 98%   BMI 29.29 kg/m     Wt Readings from Last 3 Encounters:  03/23/20 216 lb (98 kg)  11/09/19 222 lb (100.7 kg)  11/06/19 223 lb (101.2 kg)     GEN: Well nourished, well developed in no acute distress HEENT: Normal NECK: No JVD; No carotid bruits LYMPHATICS: No lymphadenopathy CARDIAC: S1S2 noted,RRR, no murmurs, rubs, gallops RESPIRATORY:  Clear to auscultation without rales, wheezing or rhonchi  ABDOMEN: Soft, non-tender, non-distended, +bowel sounds, no guarding. EXTREMITIES: No edema, No cyanosis, no clubbing MUSCULOSKELETAL:  No deformity  SKIN: Warm and dry NEUROLOGIC:  Alert and oriented x 3, non-focal PSYCHIATRIC:  Normal affect, good insight  ASSESSMENT:    1. Syncope and collapse   2. Essential hypertension   3. TIA (transient ischemic attack)   4. CAD in native artery   5. Diastolic dysfunction   6. Hypertriglyceridemia   7. Mixed hyperlipidemia    PLAN:      I am concerned about his recent syncope episode given how abrupt this was I like to place a ZIO  monitor on the patient to make sure there is no arrhythmias.  The meantime I  advised him if this happened again he needs to go to the emergency department to be evaluated.  No changes in his medications will be made.  He also needs to follow-up with EP if there is need for any further EP studies.  I have asked the patient to make this appointment after the duration of his ZIO monitor.  He seems to be stable from a coronary artery disease standpoint.  Continue the patient on his DAPT with aspirin and Plavix.  He will remain on atorvastatin along with coenzyme 10.  Imdur was refilled today.  His blood pressure is acceptable in the office today.  No changes will be made to his antihypertensive medication.   Hyperlipidemia continue patient on his Lipitor and coenzyme 10.  Diabetes mellitus continue patient on his current medication regimen per PCP   The patient is in agreement with the above plan. The patient left the office in stable condition.  The patient will follow up in   Medication Adjustments/Labs and Tests Ordered: Current medicines are reviewed at length with the patient today.  Concerns regarding medicines are outlined above.  No orders of the defined types were placed in this encounter.  Meds ordered this encounter  Medications  . isosorbide mononitrate (IMDUR) 30 MG 24 hr tablet    Sig: Take 1 tablet (30 mg total) by mouth daily.    Dispense:  30 tablet    Refill:  6    Patient Instructions  Medication Instructions:  No medication changes. *If you need a refill on your cardiac medications before your next appointment, please call your pharmacy*   Lab Work: None ordered If you have labs (blood work) drawn today and your tests are completely normal, you will receive your results only by: Marland Kitchen MyChart Message (if you have MyChart)  OR . A paper copy in the mail If you have any lab test that is abnormal or we need to change your treatment, we will call you to review the results.   Testing/Procedures:  WHY IS MY DOCTOR PRESCRIBING ZIO? The Zio  system is proven and trusted by physicians to detect and diagnose irregular heart rhythms -- and has been prescribed to hundreds of thousands of patients.  The FDA has cleared the Zio system to monitor for many different kinds of irregular heart rhythms. In a study, physicians were able to reach a diagnosis 90% of the time with the Zio system1.  You can wear the Zio monitor -- a small, discreet, comfortable patch -- during your normal day-to-day activity, including while you sleep, shower, and exercise, while it records every single heartbeat for analysis.  1Barrett, P., et al. Comparison of 24 Hour Holter Monitoring Versus 14 Day Novel Adhesive Patch Electrocardiographic Monitoring. American Journal of Medicine, 2014.  ZIO VS. HOLTER MONITORING The Zio monitor can be comfortably worn for up to 14 days. Holter monitors can be worn for 24 to 48 hours, limiting the time to record any irregular heart rhythms you may have. Zio is able to capture data for the 51% of patients who have their first symptom-triggered arrhythmia after 48 hours.1  LIVE WITHOUT RESTRICTIONS The Zio ambulatory cardiac monitor is a small, unobtrusive, and water-resistant patch--you might even forget you're wearing it. The Zio monitor records and stores every beat of your heart, whether you're sleeping, working out, or showering. Wear the monitor for 2 weeks, remove 04/06/20.   Follow-Up: At [facility], you and your health needs are our priority.  As part of our continuing mission to provide you with exceptional heart care, we have created designated Provider Care Teams.  These Care Teams include your primary Cardiologist (physician) and Advanced Practice Providers (APPs -  Physician Assistants and Nurse Practitioners) who all work together to provide you with the care you need, when you need it.  We recommend signing up for the patient portal called "MyChart".  Sign up information is provided on this After Visit Summary.   MyChart is used to connect with patients for Virtual Visits (Telemedicine).  Patients are able to view lab/test results, encounter notes, upcoming appointments, etc.  Non-urgent messages can be sent to your provider as well.   To learn more about what you can do with MyChart, go to [email or web address].    Your next appointment:   6 month(s)  The format for your next appointment:   In Person  Provider:   Thomasene Ripple, DO   Other Instructions  You need to see Dr.Taylor in follow up too.     Adopting a Healthy Lifestyle.  Know what a healthy weight is for you (roughly BMI <25) and aim to maintain this   Aim for 7+ servings of fruits and vegetables daily   65-80+ fluid ounces of water or unsweet tea for healthy kidneys   Limit to max 1 drink of alcohol per day; avoid smoking/tobacco   Limit animal fats in diet for cholesterol and heart health - choose grass fed whenever available   Avoid highly processed foods, and foods high in saturated/trans fats   Aim for low stress - take time to unwind and care for your mental health   Aim for 150 min of moderate intensity exercise weekly for heart health, and weights twice weekly for bone health   Aim for 7-9 hours of sleep daily  When it comes to diets, agreement about the perfect plan isnt easy to find, even among the experts. Experts at the [facility] of [employer] developed an idea known as the Healthy Eating Plate. Just imagine a plate divided into logical, healthy portions.   The emphasis is on diet quality:   Load up on vegetables and fruits - one-half of your plate: Aim for color and variety, and remember that potatoes dont count.   Go for whole grains - one-quarter of your plate: Whole wheat, barley, wheat berries, quinoa, oats, brown rice, and foods made with them. If you want pasta, go with whole wheat pasta.   Protein power - one-quarter of your plate: Fish, chicken, beans, and nuts are all healthy,  versatile protein sources. Limit red meat.   The diet, however, does go beyond the plate, offering a few other suggestions.   Use healthy plant oils, such as olive, canola, soy, corn, sunflower and peanut. Check the labels, and avoid partially hydrogenated oil, which have unhealthy trans fats.   If youre thirsty, drink water. Coffee and tea are good in moderation, but skip sugary drinks and limit milk and dairy products to one or two daily servings.   The type of carbohydrate in the diet is more important than the amount. Some sources of carbohydrates, such as vegetables, fruits, whole grains, and beans-are healthier than others.   Finally, stay active  Signed, Thomasene Ripple, DO  03/23/2020 2:51 PM    [facility]

## 2020-03-23 NOTE — Patient Instructions (Addendum)
Medication Instructions:  No medication changes. *If you need a refill on your cardiac medications before your next appointment, please call your pharmacy*   Lab Work: None ordered If you have labs (blood work) drawn today and your tests are completely normal, you will receive your results only by: Marland Kitchen MyChart Message (if you have MyChart) OR . A paper copy in the mail If you have any lab test that is abnormal or we need to change your treatment, we will call you to review the results.   Testing/Procedures:  WHY IS MY DOCTOR PRESCRIBING ZIO? The Zio system is proven and trusted by physicians to detect and diagnose irregular heart rhythms -- and has been prescribed to hundreds of thousands of patients.  The FDA has cleared the Zio system to monitor for many different kinds of irregular heart rhythms. In a study, physicians were able to reach a diagnosis 90% of the time with the Zio system1.  You can wear the Zio monitor -- a small, discreet, comfortable patch -- during your normal day-to-day activity, including while you sleep, shower, and exercise, while it records every single heartbeat for analysis.  1Barrett, P., et al. Comparison of 24 Hour Holter Monitoring Versus 14 Day Novel Adhesive Patch Electrocardiographic Monitoring. American Journal of Medicine, 2014.  ZIO VS. HOLTER MONITORING The Zio monitor can be comfortably worn for up to 14 days. Holter monitors can be worn for 24 to 48 hours, limiting the time to record any irregular heart rhythms you may have. Zio is able to capture data for the 51% of patients who have their first symptom-triggered arrhythmia after 48 hours.1  LIVE WITHOUT RESTRICTIONS The Zio ambulatory cardiac monitor is a small, unobtrusive, and water-resistant patch--you might even forget you're wearing it. The Zio monitor records and stores every beat of your heart, whether you're sleeping, working out, or showering. Wear the monitor for 2 weeks, remove  04/06/20.   Follow-Up: At [facility], you and your health needs are our priority.  As part of our continuing mission to provide you with exceptional heart care, we have created designated Provider Care Teams.  These Care Teams include your primary Cardiologist (physician) and Advanced Practice Providers (APPs -  Physician Assistants and Nurse Practitioners) who all work together to provide you with the care you need, when you need it.  We recommend signing up for the patient portal called "MyChart".  Sign up information is provided on this After Visit Summary.  MyChart is used to connect with patients for Virtual Visits (Telemedicine).  Patients are able to view lab/test results, encounter notes, upcoming appointments, etc.  Non-urgent messages can be sent to your provider as well.   To learn more about what you can do with MyChart, go to [email or web address].    Your next appointment:   6 month(s)  The format for your next appointment:   In Person  Provider:   Thomasene Ripple, DO   Other Instructions  You need to see Dr.Taylor in follow up too.

## 2020-03-23 NOTE — Addendum Note (Signed)
Addended by: Eleonore Chiquito on: 03/23/2020 03:24 PM   Modules accepted: Orders

## 2020-03-24 DIAGNOSIS — R55 Syncope and collapse: Secondary | ICD-10-CM | POA: Diagnosis not present

## 2020-03-25 ENCOUNTER — Telehealth: Payer: Self-pay | Admitting: Cardiology

## 2020-03-25 NOTE — Telephone Encounter (Signed)
[place] from [facility] called and states that the patient came to their office yesterday and the monitor placed on him. He came back today because it fell off and they were unable to get it to stick again. [place] asks that we reach out to the patient for further instructions. You can reach the patient at [phone]

## 2020-03-25 NOTE — Telephone Encounter (Signed)
LVMTCB

## 2020-03-25 NOTE — Telephone Encounter (Signed)
Pt aware that we will have a Zio AT mailed to his home. Pt verbalized understanding and had no additional questions.

## 2020-03-27 ENCOUNTER — Encounter: Payer: Self-pay | Admitting: Internal Medicine

## 2020-03-29 ENCOUNTER — Ambulatory Visit (INDEPENDENT_AMBULATORY_CARE_PROVIDER_SITE_OTHER): Payer: Medicare HMO

## 2020-03-29 DIAGNOSIS — R55 Syncope and collapse: Secondary | ICD-10-CM | POA: Diagnosis not present

## 2020-04-01 ENCOUNTER — Telehealth: Payer: Self-pay | Admitting: Internal Medicine

## 2020-04-01 NOTE — Telephone Encounter (Signed)
Received a call from [employer] to give update on patient. Put monitor on on the 24th, stopped working on the 25th. They said that they only have 1 day of data from patient. Also they cannot get in contact with patient. Please call [employer] at [phone]. Give reference# [id number]

## 2020-04-01 NOTE — Telephone Encounter (Signed)
Spoke with [employer] and they have not received any recordings from the patients monitor since 8/25. They have called and left messages as well.  I also called pt and left him a voicemail asking he call [employer] to trouble shoot or to call the office. If pt were to call the office, [employer] would like for [place] to contact them and let them know.

## 2020-04-03 ENCOUNTER — Other Ambulatory Visit: Payer: Self-pay | Admitting: Family Medicine

## 2020-04-03 DIAGNOSIS — F419 Anxiety disorder, unspecified: Secondary | ICD-10-CM

## 2020-04-12 ENCOUNTER — Ambulatory Visit: Payer: Medicare HMO | Admitting: Internal Medicine

## 2020-04-12 ENCOUNTER — Encounter: Payer: Self-pay | Admitting: Internal Medicine

## 2020-04-12 ENCOUNTER — Other Ambulatory Visit: Payer: Self-pay

## 2020-04-12 VITALS — BP 138/84 | HR 90 | Ht 72.0 in | Wt 223.0 lb

## 2020-04-12 DIAGNOSIS — R55 Syncope and collapse: Secondary | ICD-10-CM

## 2020-04-12 DIAGNOSIS — I471 Supraventricular tachycardia: Secondary | ICD-10-CM

## 2020-04-12 NOTE — Patient Instructions (Signed)
Medication Instructions:  Your physician recommends that you continue on your current medications as directed. Please refer to the Current Medication list given to you today.  *If you need a refill on your cardiac medications before your next appointment, please call your pharmacy*   Lab Work: NONE   If you have labs (blood work) drawn today and your tests are completely normal, you will receive your results only by: Marland Kitchen MyChart Message (if you have MyChart) OR . A paper copy in the mail If you have any lab test that is abnormal or we need to change your treatment, we will call you to review the results.   Testing/Procedures: NONE    Follow-Up: At [facility], you and your health needs are our priority.  As part of our continuing mission to provide you with exceptional heart care, we have created designated Provider Care Teams.  These Care Teams include your primary Cardiologist (physician) and Advanced Practice Providers (APPs -  Physician Assistants and Nurse Practitioners) who all work together to provide you with the care you need, when you need it.  We recommend signing up for the patient portal called "MyChart".  Sign up information is provided on this After Visit Summary.  MyChart is used to connect with patients for Virtual Visits (Telemedicine).  Patients are able to view lab/test results, encounter notes, upcoming appointments, etc.  Non-urgent messages can be sent to your provider as well.   To learn more about what you can do with MyChart, go to [email or web address].    Your next appointment:   6 month(s)  The format for your next appointment:   In Person  Provider:   You may see Dr Ladona Ridgel or one of the following Advanced Practice Providers on your designated Care Team:    Gypsy Balsam, NP  Francis Dowse, [place]  Casimiro Needle "Otilio Saber, [place]    Other Instructions Thank you for choosing [facility]!

## 2020-04-12 NOTE — Progress Notes (Signed)
HPI Sean Horn returns today for followup. He is a pleasant middle aged man with HTN who was found to have recurrent SVT and underwent EP study and catheter ablation several weeks ago. He had done well but then had a syncopal spell. He has worn a monitor but it fell off and he had another placed. His initial monitor was unrevealing but the second monitor is pending. He denies chest pain, sob, or palpitations. Review of his ablation demonstrated that his SVT was initially 190/min then after slow pathway ablation was inducible at 150/min, then after more RF, not inducible. He has not had edema.  Allergies  Allergen Reactions  . Rosuvastatin Other (See Comments)  . Toradol [Ketorolac Tromethamine] Itching     Current Outpatient Medications  Medication Sig Dispense Refill  . aspirin EC 81 MG EC tablet Take 1 tablet (81 mg total) by mouth daily. 90 tablet 3  . atorvastatin (LIPITOR) 40 MG tablet Take 1 tablet (40 mg total) by mouth daily. 90 tablet 3  . clopidogrel (PLAVIX) 75 MG tablet Take 1 tablet (75 mg total) by mouth daily with breakfast. 30 tablet 6  . Coenzyme Q10 (COQ-10) 75 MG CAPS Take 75 mg by mouth daily.  0  . diclofenac Sodium (VOLTAREN) 1 % GEL Apply 2 g topically Nightly.    . Dulaglutide (TRULICITY) 4.5 MG/0.5ML SOPN Inject 4.5 mg as directed once a week. 12 pen 3  . escitalopram (LEXAPRO) 20 MG tablet Take 1 tablet by mouth once daily 30 tablet 0  . EUTHYROX 25 MCG tablet TAKE 1 TABLET BY MOUTH ONCE DAILY BEFORE BREAKFAST 90 tablet 0  . insulin aspart (NOVOLOG FLEXPEN) 100 UNIT/ML FlexPen Inject 25-35 units 3 times daily with meals 30 mL 5  . insulin detemir (LEVEMIR) 100 UNIT/ML injection Inject 0.9 mLs (90 Units total) into the skin at bedtime. 81 mL 1  . isosorbide mononitrate (IMDUR) 30 MG 24 hr tablet Take 1 tablet (30 mg total) by mouth daily. 30 tablet 6  . lansoprazole (PREVACID) 30 MG capsule Take 30 mg by mouth every morning.     Marland Kitchen levothyroxine (SYNTHROID) 25  MCG tablet TAKE 1 TABLET BY MOUTH ONCE DAILY BEFORE BREAKFAST 90 tablet 0  . lisinopril (ZESTRIL) 10 MG tablet Take 1 tablet by mouth once daily 90 tablet 1  . loratadine (CLARITIN) 10 MG tablet Take 10 mg by mouth every morning.     . metFORMIN (GLUCOPHAGE-XR) 500 MG 24 hr tablet TAKE 2 TABLETS BY MOUTH TWICE DAILY WITH MEALS 360 tablet 3  . metoprolol succinate (TOPROL-XL) 50 MG 24 hr tablet Take 1 tablet (50 mg total) by mouth daily. Take with or immediately following a meal. 90 tablet 3  . nitroGLYCERIN (NITROSTAT) 0.4 MG SL tablet Place 1 tablet (0.4 mg total) under the tongue every 5 (five) minutes as needed. 25 tablet 12   No current facility-administered medications for this visit.     Past Medical History:  Diagnosis Date  . Anxiety   . CAD in native artery    a. cath 06/26/2019 - 80% apical LAD >> medical management   . Chronic headaches   . Chronic neck pain   . Coronary artery disease involving native coronary artery of native heart without angina pectoris 11/06/2019  . GERD (gastroesophageal reflux disease)    occasional - OTC as needed  . History of chicken pox   . History of kidney stones   . History of shingles   .  Hypertension    under control with med., had been on med. since age 66  . Insomnia 09/13/2016  . Insulin dependent diabetes mellitus    Follows with endocrinology, Dr Chestine Spore reports his last hgba1c was 7.3   . Non-insulin dependent type 2 diabetes mellitus (HCC)   . Non-ST elevation (NSTEMI) myocardial infarction (HCC) 06/26/2019  . Obesity 01/30/2017  . Occipital neuralgia   . Osteoarthritis    right knee  . PONV (postoperative nausea and vomiting)   . PONV (postoperative nausea and vomiting)   . Preventative health care 08/14/2013  . 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 SVT (supraventricular tachycardia) (HCC) 06/26/2019  . Tachycardia   . TIA (transient ischemic attack) 11/03/2018  . Type 2 diabetes mellitus with complication, with long-term  current use of insulin (HCC)    Follows with endocrinology, Dr Chestine Spore reports his last hgba1c was 7.3   . Urinary frequency 12/11/2016    ROS:   All systems reviewed and negative except as noted in the HPI.   Past Surgical History:  Procedure Laterality Date  . ANTERIOR CERVICAL DECOMP/DISCECTOMY FUSION N/A 01/16/2018   Procedure: ANTERIOR CERVICAL DECOMPRESSION/DISCECTOMY FUSION CERVICAL FOUR-FIVE, CERVICAL FIVE-SIX;  Surgeon: Lisbeth Renshaw, MD;  Location: [facility] OR;  Service: Neurosurgery;  Laterality: N/A;  ANTERIOR CERVICAL DECOMPRESSION/DISCECTOMY FUSION CERVICAL FOUR-FIVE, CERVICAL FIVE-SIX  . CHOLECYSTECTOMY  1980's  . CYSTOSCOPY/RETROGRADE/URETEROSCOPY/STONE EXTRACTION WITH BASKET Right 08/27/2003   and double J stent placement  . ELECTROPHYSIOLOGY STUDY N/A 07/23/2019   Procedure: ELECTROPHYSIOLOGY STUDY;  Surgeon: Marinus Maw, MD;  Location: [facility] INVASIVE CV LAB;  Service: Cardiovascular;  Laterality: N/A;  . INGUINAL HERNIA REPAIR Left 1990's  . KNEE ARTHROSCOPY Left 1980's  . KNEE ARTHROSCOPY Right 2013  . KNEE ARTHROSCOPY Right 06/30/2013   Procedure: RIGHT KNEE ARTHROSCOPY AND SYNOVECTOMY;  Surgeon: Velna Ochs, MD;  Location: [facility];  Service: Orthopedics;  Laterality: Right;  . LEFT HEART CATH AND CORONARY ANGIOGRAPHY N/A 06/26/2019   Procedure: LEFT HEART CATH AND CORONARY ANGIOGRAPHY;  Surgeon: Marykay Lex, MD;  Location: [facility] INVASIVE CV LAB;  Service: Cardiovascular;  Laterality: N/A;  . LITHOTRIPSY     multiple, b/l  . SHOULDER ADHESION RELEASE Left ` 1998  . SHOULDER ARTHROSCOPY W/ ROTATOR CUFF REPAIR Left 10/20/2004   x 4, had to have part of clavile removed. torn rotator cuff multiple times, had to place screws   . SHOULDER ARTHROSCOPY W/ SUPERIOR LABRAL ANTERIOR POSTERIOR LESION REPAIR Left 12/22/2004  . SVT ABLATION N/A 07/23/2019   Procedure: SVT ABLATION;  Surgeon: Marinus Maw, MD;  Location: [facility] INVASIVE CV LAB;  Service:  Cardiovascular;  Laterality: N/A;  . TOTAL KNEE ARTHROPLASTY Right 12/23/2012   x3, 2 arthroscopies and a TKR,  TOTAL KNEE ARTHROPLASTY;  Surgeon: Velna Ochs, MD;  Location: [facility] OR;  Service: Orthopedics;  Laterality: Right;  DEPUY, RNFA  . WRIST SURGERY Right    x 3, torn ligaments, pins placed then infected, then fused with plate     Family History  Problem Relation Age of Onset  . COPD Mother        smoker  . Hypertension Mother   . Heart disease Mother        CHF  . Diabetes Father   . Heart disease Father        CABG in 51's  . Stroke Father   . Hypertension Father   . Peripheral vascular disease Father   .  Hypertension Son        tachycardia  . Heart disease Maternal Grandfather   . Arthritis Paternal Grandmother   . Colon cancer Neg Hx   . Rectal cancer Neg Hx   . Stomach cancer Neg Hx      Social History   Socioeconomic History  . Marital status: Single    Spouse name: Not on file  . Number of children: 2  . Years of education: Not on file  . Highest education level: High school graduate  Occupational History  . Occupation: Disabled  Tobacco Use  . Smoking status: Never Smoker  . Smokeless tobacco: Current User    Types: Snuff  Vaping Use  . Vaping Use: Never used  Substance and Sexual Activity  . Alcohol use: No  . Drug use: No  . Sexual activity: Yes  Other Topics Concern  . Not on file  Social History Narrative   Lives at home with his father (he takes care of his father)   Right handed   Drinks 2 cups of caffeine daily   Social Determinants of Health   Financial Resource Strain:   . Difficulty of Paying Living Expenses: Not on file  Food Insecurity:   . Worried About Programme researcher, broadcasting/film/video in the Last Year: Not on file  . Ran Out of Food in the Last Year: Not on file  Transportation Needs:   . Lack of Transportation (Medical): Not on file  . Lack of Transportation (Non-Medical): Not on file  Physical Activity:   . Days of Exercise per  Week: Not on file  . Minutes of Exercise per Session: Not on file  Stress:   . Feeling of Stress : Not on file  Social Connections:   . Frequency of Communication with Friends and Family: Not on file  . Frequency of Social Gatherings with Friends and Family: Not on file  . Attends Religious Services: Not on file  . Active Member of Clubs or Organizations: Not on file  . Attends Banker Meetings: Not on file  . Marital Status: Not on file  Intimate Partner Violence:   . Fear of Current or Ex-Partner: Not on file  . Emotionally Abused: Not on file  . Physically Abused: Not on file  . Sexually Abused: Not on file     BP 138/84 (BP Location: Left Arm, Patient Position: Sitting, Cuff Size: Normal)   Pulse 90   Ht 6' (1.829 m)   Wt 223 lb (101.2 kg)   SpO2 95%   BMI 30.24 kg/m   Physical Exam:  Well appearing NAD HEENT: Unremarkable Neck:  No JVD, no thyromegally Lymphatics:  No adenopathy Back:  No CVA tenderness Lungs:  Clear with no wheezes HEART:  Regular rate rhythm, no murmurs, no rubs, no clicks Abd:  soft, positive bowel sounds, no organomegally, no rebound, no guarding Ext:  2 plus pulses, no edema, no cyanosis, no clubbing Skin:  No rashes no nodules Neuro:  CN II through XII intact, motor grossly intact  Assess/Plan: 1. SVT - he is s/p ablation. Unclear if he is having more symptoms. He does not have palpitations.  2. Syncope - the etiology is unclear. If he has more SVT then catheter ablation would be recommended though at increase risk for CHB.  Sharlot Gowda Sean Furtick,MD

## 2020-04-13 ENCOUNTER — Telehealth: Payer: Self-pay

## 2020-04-13 NOTE — Telephone Encounter (Signed)
-----   Message from Thomasene Ripple, DO sent at 04/12/2020  4:40 PM EDT ----- You did have a few beats of ventricular tachycardia which are his heartbeat from the bottom of the heart during the time he wore the monitor.  I do see that you did see Dr. Ladona Ridgel today.  We will keep you on a medication regimen for now.

## 2020-04-13 NOTE — Telephone Encounter (Signed)
Left message on patients voicemail to please return our call.   

## 2020-04-13 NOTE — Telephone Encounter (Signed)
Spoke with patient regarding results and recommendation.  Patient verbalizes understanding and is agreeable to plan of care. Advised patient to call back with any issues or concerns.  

## 2020-04-13 NOTE — Telephone Encounter (Signed)
-----   Message from Kardie Tobb, DO sent at 04/12/2020  4:40 PM EDT ----- °You did have a few beats of ventricular tachycardia which are his heartbeat from the bottom of the heart during the time he wore the monitor.  I do see that you did see Dr. Taylor today.  We will keep you on a medication regimen for now. °

## 2020-04-14 ENCOUNTER — Ambulatory Visit: Payer: Medicare HMO | Admitting: Internal Medicine

## 2020-04-14 ENCOUNTER — Other Ambulatory Visit: Payer: Self-pay

## 2020-04-14 ENCOUNTER — Encounter: Payer: Self-pay | Admitting: Internal Medicine

## 2020-04-14 VITALS — BP 130/80 | HR 101 | Ht 72.0 in | Wt 220.0 lb

## 2020-04-14 DIAGNOSIS — Z794 Long term (current) use of insulin: Secondary | ICD-10-CM | POA: Diagnosis not present

## 2020-04-14 DIAGNOSIS — E785 Hyperlipidemia, unspecified: Secondary | ICD-10-CM | POA: Diagnosis not present

## 2020-04-14 DIAGNOSIS — E118 Type 2 diabetes mellitus with unspecified complications: Secondary | ICD-10-CM | POA: Diagnosis not present

## 2020-04-14 DIAGNOSIS — E669 Obesity, unspecified: Secondary | ICD-10-CM | POA: Diagnosis not present

## 2020-04-14 LAB — POCT GLYCOSYLATED HEMOGLOBIN (HGB A1C): Hemoglobin A1C: 9.6 % — AB (ref 4.0–5.6)

## 2020-04-14 NOTE — Progress Notes (Signed)
Patient ID: Sean Horn, male   DOB: 05/07/1961, 59 y.o.   MRN: [id number]   This visit occurred during the SARS-CoV-2 public health emergency.  Safety protocols were in place, including screening questions prior to the visit, additional usage of staff PPE, and extensive cleaning of exam room while observing appropriate contact time as indicated for disinfecting solutions.   HPI: Sean Horn is a 59 y.o.-year-old male, returning for follow-up for DM2, dx in 2013, insulin-dependent since 2017, uncontrolled, with complications (CAD - s/p NSTEMI, SVT, dCHF, h/o TIA). He was previously seen by Dr. Chestine Spore.  Last visit 5.5 months ago.  Before last visit, he had cardiac investigation for palpitations/syncope (SVT).  He had a cardiac ablation. He had a cardiac catheterization 06/2019 which showed coronary artery disease (80% LAD obstruction unsuitable for PCI -only amenable to medical management).  She had another syncope in 03/2020.  He has AVNRT.  Reviewed HbA1c: Lab Results  Component Value Date   HGBA1C 12.7 (A) 11/09/2019   HGBA1C 8.0 (H) 06/26/2019   HGBA1C 8.7 (A) 05/19/2019   He is on: - Levemir 90 units at bedtime - Metformin ER 1000 mg >> 500 mg >> 1000 mg 2x a day  - Humalog-restarted 11/2019: 25 units for a smaller meal 30 units for a regular meal 35 units for a larger meal - Trulicity 4.5 mg weekly in am-increased 11/2019  Pt was not checking sugars at last visit.  Now checking 4 times a day with his freestyle libre CGM -however, he did not have enough data in the CGM now since he is not scanning every 8 hours. - am: 298-400 >> 180-330 >> 60, 130-140, 160 >> 80 x1 >> 236, 279 (but he was off Metformin at that time) - 2h after b'fast: n/c >> 336 >> n/c  - before lunch: 80 x1  >> >600 >> n/c >> 60 x1 >> 80-221 - 2h after lunch: n/c - before dinner: n/c >> 75 - 2h after dinner: n/c >> 181, 201 - bedtime: 200-300s >> n/c >> 197, 201 - nighttime: n/c Lowest sugar was 80 >>  200s >> 180 >> 60 x2 >> 80 >> 60; he has hypoglycemia awareness in the 80s. Highest sugar was 400s >> HI >> 350 >> 200s >> 200 >> 310.  Glucometer: One Touch Ultra  Pt's meals are: - Breakfast: cereal: cheerios + 2% milk - Lunch: sandwich  - Dinner: meat + veggies and less starches - Snacks: after dinner - nuts; granola; sugar-free cookies,  Midnight snack: e.g. banana   -No CKD, last BUN/creatinine:  Lab Results  Component Value Date   BUN 10 07/20/2019   BUN 11 06/26/2019   CREATININE 0.85 07/20/2019   CREATININE 1.10 06/26/2019  On lisinopril 10. -+ HL; last set of lipids: Lab Results  Component Value Date   CHOL 155 06/26/2019   HDL 27 (L) 06/26/2019   LDLCALC 77 06/26/2019   LDLDIRECT 128.0 12/11/2016   TRIG 254 (H) 06/26/2019   CHOLHDL 5.7 06/26/2019   On Crestor 10 - last eye exam was in 10/2018: No DR reportedly - no numbness and tingling in his feet.  He has family history of diabetes in father.  ROS: Constitutional: no weight gain/no weight loss, no fatigue, no subjective hyperthermia, no subjective hypothermia Eyes: no blurry vision, no xerophthalmia ENT: no sore throat, no nodules palpated in neck, no dysphagia, no odynophagia, no hoarseness Cardiovascular: no CP/no SOB/+ palpitations/no leg swelling Respiratory: no cough/no SOB/no wheezing Gastrointestinal: no  N/no V/no D/no C/no acid reflux Musculoskeletal: no muscle aches/+ joint aches Skin: no rashes, no hair loss Neurological: + (Hereditary) tremors/no numbness/no tingling/no dizziness  I reviewed pt's medications, allergies, PMH, social hx, family hx, and changes were documented in the history of present illness. Otherwise, unchanged from my initial visit note.   Past Medical History:  Diagnosis Date  . Anxiety   . CAD in native artery    a. cath 06/26/2019 - 80% apical LAD >> medical management   . Chronic headaches   . Chronic neck pain   . Coronary artery disease involving native coronary  artery of native heart without angina pectoris 11/06/2019  . GERD (gastroesophageal reflux disease)    occasional - OTC as needed  . History of chicken pox   . History of kidney stones   . History of shingles   . Hypertension    under control with med., had been on med. since age 59  . Insomnia 09/13/2016  . Insulin dependent diabetes mellitus    Follows with endocrinology, Dr Chestine Spore reports his last hgba1c was 7.3   . Non-insulin dependent type 2 diabetes mellitus (HCC)   . Non-ST elevation (NSTEMI) myocardial infarction (HCC) 06/26/2019  . Obesity 01/30/2017  . Occipital neuralgia   . Osteoarthritis    right knee  . PONV (postoperative nausea and vomiting)   . PONV (postoperative nausea and vomiting)   . Preventative health care 08/14/2013  . 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 SVT (supraventricular tachycardia) (HCC) 06/26/2019  . Tachycardia   . TIA (transient ischemic attack) 11/03/2018  . Type 2 diabetes mellitus with complication, with long-term current use of insulin (HCC)    Follows with endocrinology, Dr Chestine Spore reports his last hgba1c was 7.3   . Urinary frequency 12/11/2016   Past Surgical History:  Procedure Laterality Date  . ANTERIOR CERVICAL DECOMP/DISCECTOMY FUSION N/A 01/16/2018   Procedure: ANTERIOR CERVICAL DECOMPRESSION/DISCECTOMY FUSION CERVICAL FOUR-FIVE, CERVICAL FIVE-SIX;  Surgeon: Lisbeth Renshaw, MD;  Location: [facility] OR;  Service: Neurosurgery;  Laterality: N/A;  ANTERIOR CERVICAL DECOMPRESSION/DISCECTOMY FUSION CERVICAL FOUR-FIVE, CERVICAL FIVE-SIX  . CHOLECYSTECTOMY  1980's  . CYSTOSCOPY/RETROGRADE/URETEROSCOPY/STONE EXTRACTION WITH BASKET Right 08/27/2003   and double J stent placement  . ELECTROPHYSIOLOGY STUDY N/A 07/23/2019   Procedure: ELECTROPHYSIOLOGY STUDY;  Surgeon: Marinus Maw, MD;  Location: [facility] INVASIVE CV LAB;  Service: Cardiovascular;  Laterality: N/A;  . INGUINAL HERNIA REPAIR Left 1990's  . KNEE ARTHROSCOPY Left 1980's  . KNEE  ARTHROSCOPY Right 2013  . KNEE ARTHROSCOPY Right 06/30/2013   Procedure: RIGHT KNEE ARTHROSCOPY AND SYNOVECTOMY;  Surgeon: Velna Ochs, MD;  Location: [facility];  Service: Orthopedics;  Laterality: Right;  . LEFT HEART CATH AND CORONARY ANGIOGRAPHY N/A 06/26/2019   Procedure: LEFT HEART CATH AND CORONARY ANGIOGRAPHY;  Surgeon: Marykay Lex, MD;  Location: [facility] INVASIVE CV LAB;  Service: Cardiovascular;  Laterality: N/A;  . LITHOTRIPSY     multiple, b/l  . SHOULDER ADHESION RELEASE Left ` 1998  . SHOULDER ARTHROSCOPY W/ ROTATOR CUFF REPAIR Left 10/20/2004   x 4, had to have part of clavile removed. torn rotator cuff multiple times, had to place screws   . SHOULDER ARTHROSCOPY W/ SUPERIOR LABRAL ANTERIOR POSTERIOR LESION REPAIR Left 12/22/2004  . SVT ABLATION N/A 07/23/2019   Procedure: SVT ABLATION;  Surgeon: Marinus Maw, MD;  Location: [facility] INVASIVE CV LAB;  Service: Cardiovascular;  Laterality: N/A;  . TOTAL KNEE ARTHROPLASTY Right 12/23/2012  x3, 2 arthroscopies and a TKR,  TOTAL KNEE ARTHROPLASTY;  Surgeon: Velna Ochs, MD;  Location: [facility] OR;  Service: Orthopedics;  Laterality: Right;  DEPUY, RNFA  . WRIST SURGERY Right    x 3, torn ligaments, pins placed then infected, then fused with plate   Social History   Social History  . Marital status: Single    Spouse name: N/A  . Number of children: 2   Occupational History  . Retired, disability after his R knee sx.   Social History Main Topics  . Smoking status: Never Smoker  . Smokeless tobacco: Current User    Types: Snuff  . Alcohol use Yes     Comment: occasionally  . Drug use: No  . Sexual activity: Yes     Comment: currently applying for disability due to knee, lives with girfriend, no dietary restrictions, previously Games developer   Current Outpatient Medications on File Prior to Visit  Medication Sig Dispense Refill  . aspirin EC 81 MG EC tablet Take 1 tablet (81 mg total) by mouth daily. 90  tablet 3  . atorvastatin (LIPITOR) 40 MG tablet Take 1 tablet (40 mg total) by mouth daily. 90 tablet 3  . clopidogrel (PLAVIX) 75 MG tablet Take 1 tablet (75 mg total) by mouth daily with breakfast. 30 tablet 6  . Coenzyme Q10 (COQ-10) 75 MG CAPS Take 75 mg by mouth daily.  0  . diclofenac Sodium (VOLTAREN) 1 % GEL Apply 2 g topically Nightly.    . Dulaglutide (TRULICITY) 4.5 MG/0.5ML SOPN Inject 4.5 mg as directed once a week. 12 pen 3  . escitalopram (LEXAPRO) 20 MG tablet Take 1 tablet by mouth once daily 30 tablet 0  . EUTHYROX 25 MCG tablet TAKE 1 TABLET BY MOUTH ONCE DAILY BEFORE BREAKFAST 90 tablet 0  . insulin aspart (NOVOLOG FLEXPEN) 100 UNIT/ML FlexPen Inject 25-35 units 3 times daily with meals 30 mL 5  . insulin detemir (LEVEMIR) 100 UNIT/ML injection Inject 0.9 mLs (90 Units total) into the skin at bedtime. 81 mL 1  . isosorbide mononitrate (IMDUR) 30 MG 24 hr tablet Take 1 tablet (30 mg total) by mouth daily. 30 tablet 6  . lansoprazole (PREVACID) 30 MG capsule Take 30 mg by mouth every morning.     Marland Kitchen levothyroxine (SYNTHROID) 25 MCG tablet TAKE 1 TABLET BY MOUTH ONCE DAILY BEFORE BREAKFAST 90 tablet 0  . lisinopril (ZESTRIL) 10 MG tablet Take 1 tablet by mouth once daily 90 tablet 1  . loratadine (CLARITIN) 10 MG tablet Take 10 mg by mouth every morning.     . metFORMIN (GLUCOPHAGE-XR) 500 MG 24 hr tablet TAKE 2 TABLETS BY MOUTH TWICE DAILY WITH MEALS 360 tablet 3  . metoprolol succinate (TOPROL-XL) 50 MG 24 hr tablet Take 1 tablet (50 mg total) by mouth daily. Take with or immediately following a meal. 90 tablet 3  . nitroGLYCERIN (NITROSTAT) 0.4 MG SL tablet Place 1 tablet (0.4 mg total) under the tongue every 5 (five) minutes as needed. 25 tablet 12   No current facility-administered medications on file prior to visit.   Allergies  Allergen Reactions  . Rosuvastatin Other (See Comments)  . Toradol [Ketorolac Tromethamine] Itching   Family History  Problem Relation Age of  Onset  . COPD Mother        smoker  . Hypertension Mother   . Heart disease Mother        CHF  . Diabetes Father   . Heart  disease Father        CABG in 35's  . Stroke Father   . Hypertension Father   . Peripheral vascular disease Father   . Hypertension Son        tachycardia  . Heart disease Maternal Grandfather   . Arthritis Paternal Grandmother   . Colon cancer Neg Hx   . Rectal cancer Neg Hx   . Stomach cancer Neg Hx    PE: BP 130/80   Pulse (!) 101   Ht 6' (1.829 m)   Wt 220 lb (99.8 kg)   BMI 29.84 kg/m  Wt Readings from Last 3 Encounters:  04/14/20 220 lb (99.8 kg)  04/12/20 223 lb (101.2 kg)  03/23/20 216 lb (98 kg)   Constitutional: overweight, in NAD Eyes: PERRLA, EOMI, no exophthalmos ENT: moist mucous membranes, no thyromegaly, no cervical lymphadenopathy Cardiovascular: tachycardia, RR, No MRG Respiratory: CTA B Gastrointestinal: abdomen soft, NT, ND, BS+ Musculoskeletal: no deformities, strength intact in all 4 Skin: moist, warm, no rashes Neurological: no tremor with outstretched hands, DTR normal in all 4  ASSESSMENT: 1. DM2, insulin-dependent, uncontrolled, without long term complications, but significant hyperglycemia  CAD - s/p NSTEMI  SVT  dCHF  h/o TIA  2. HL  3.  Obesity class I  PLAN:  1. Patient with history of uncontrolled type 2 diabetes, with improving HbA1c in the past, but still above goal.  However, at last visit, he was not checking sugars and was not taking his Humalog consistently so his HbA1c was much higher, at 12.7%.  At that time we will restart Humalog at full dose and increase his Trulicity.  Since then he had GI intolerance to Metformin and he had to decrease the doses >> however, he was then able to increase this again.  We discussed at last visit about maybe retrying an SGLT2 inhibitor, which we used in the past but could not continue due to increased urination. -At this visit, we reviewed together his CGM tracings.  He only has few continuous traces as he was not scanning his sensor frequently enough. I explained that he needs to scan at the least every 8 hours for continuous data. His sugars were high at night and then dropping for the rest of the day with occasional higher blood sugars after dinner. However, at the time that he was checking his sugars with a CGM, he was  on th lower dose of Metformin, which he was able to increase since then. He feels that his sugars lately. Without more data, for now, I advised him to continue the current regimen but occasionally use a higher dose of Humalog for a larger meal since he mainly uses the 25 unit dose currently. -I suggested to:  Patient Instructions  Please continue: - Levemir 90 units at bedtime - Metformin ER 1000 mg 2x a day - Humalog: 25 units for a smaller meal 30 units for a regular meal 35 units for a larger meal - Trulicity 4.5 mg weekly in am  Scan the sensor at least every 8h.  Please return in 3 months with your sugar log.   - we checked his HbA1c: 9.6% (improved) - advised to check sugars at different times of the day - 4x a day, rotating check times - advised for yearly eye exams >> he is not UTD - return to clinic in 3-4 months  2. HL  -Reviewed latest lipid panel from 06/2019: LDL above goal, triglycerides high, HDL low: Lab Results  Component Value Date   CHOL 155 06/26/2019   HDL 27 (L) 06/26/2019   LDLCALC 77 06/26/2019   LDLDIRECT 128.0 12/11/2016   TRIG 254 (H) 06/26/2019   CHOLHDL 5.7 06/26/2019  -Continues Crestor without side effects  3.  Obesity class I -Continue Trulicity which should also help with weight loss -Did not lose a significant amount of weight before last visit; 2 more pounds lost since then.  Carlus Pavlovristina Dodge Ator, MD PhD [facility]

## 2020-04-14 NOTE — Patient Instructions (Addendum)
Please continue: - Levemir 90 units at bedtime - Metformin ER 1000 mg 2x a day - Humalog: 25 units for a smaller meal 30 units for a regular meal 35 units for a larger meal - Trulicity 4.5 mg weekly in am  Scan the sensor at least every 8h.  Please return in 3 months with your sugar log.

## 2020-04-14 NOTE — Addendum Note (Signed)
Addended by: Darliss Ridgel I on: 04/14/2020 04:01 PM   Modules accepted: Orders

## 2020-04-20 ENCOUNTER — Other Ambulatory Visit: Payer: Self-pay | Admitting: Cardiology

## 2020-04-20 DIAGNOSIS — R55 Syncope and collapse: Secondary | ICD-10-CM

## 2020-04-20 NOTE — Progress Notes (Signed)
[facility] [address], [address] [place], [place] [address] [phone] [phone]  Date:  04/21/2020   Name:  Sean Horn   DOB:  05/25/1961   MRN:  [id number]  PCP:  Pearline Cablesopland, Anchor Dwan C, MD    Chief Complaint: Joint Pain (right knee pain, torn rotator cuff in right shoulder) and Leg Pain (bilateral leg aching, worse at not, trouble sleeping)   History of Present Illness:  Sean Horn is a 59 y.o. very pleasant male patient who presents with the following:  History of hypertension, NSTEMI/CAD, SVT status post catheter ablation in December 2020 His most recent cardiac cath showed an 80% LAD lesion unsuitable for PCI, aggressive medical management recommended  Patient here today to discuss concern of pain in his knees and legs, as well as chronic right shoulder and neck pain Last seen by myself in October 2020 with concern of chronic right shoulder pain-at time he had noted right shoulder pain for about 8 months.  He had a left shoulder rotator cuff repair per Dr. Romeo AppleHarrison in the past He did go back and see Dr Romeo AppleHarrison- they recommended PT for now  He also had a cervical decompression/ fusion in 2010; still has some residual pain from his neck  He is seeing Dr. Elvera LennoxGherghe for diabetes management, most recent visit with her was last week-he is currently being treated with Levemir as well as Humalog, Trulicity, Metformin.  Most recent A1c was 9.6% which is an improvement  He also saw his cardiologist, Dr. Lewayne BuntingGregg Taylor on September 7-history of SVT status post catheter ablation with subsequent episode of syncope.  No further episodes of syncope  Assess/Plan: 1. SVT - he is s/p ablation. Unclear if he is having more symptoms. He does not have palpitations.  2. Syncope - the etiology is unclear. If he has more SVT then catheter ablation would be recommended though at increase risk for CHB.  Hep C screening- do today  COVID-19 vaccine- 2nd  dose is tomorrow Flu vaccine- give today  Eye exam; about 18 months ago, he will schedule Foot exam Colon cancer screening- he did do cologuard a few years ago, not sure of exact year.  He did a FOBT last year negative per his report- will order cologuard for him  Pneumonia vaccine Patient has A1c on chart, otherwise no recent labs  He notes pain in his bilateral knees- he is s/p RIGHT knee replacement  He will get aching in his feet and knees mostly at night He has no dx of restless legs, but does notice an urge to move his legs around at night  This disturbs his sleep  He has to be careful of NSAIDs due to his use of aspirin and plavix.  Tylenol does not relieve his pain  Patient Active Problem List   Diagnosis Date Noted  . PONV (postoperative nausea and vomiting)   . Non-insulin dependent type 2 diabetes mellitus (HCC)   . Chronic neck pain   . Chronic headaches   . CAD in native artery   . Coronary artery disease involving native coronary artery of native heart without angina pectoris 11/06/2019  . Diastolic dysfunction 11/06/2019  . SVT (supraventricular tachycardia) (HCC) 06/26/2019  . Non-ST elevation (NSTEMI) myocardial infarction (HCC) 06/26/2019  . Tachycardia 06/25/2019  . Hypertriglyceridemia 06/16/2019  . Palpitations 06/12/2019  . Morning headache 12/02/2018  . 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 Retrognathia 12/02/2018  .  TIA (transient ischemic attack) 11/03/2018  . Cervical stenosis of spine 01/16/2018  . Hyperlipidemia 07/19/2017  . Obesity 01/30/2017  . Urinary frequency 12/11/2016  . Insomnia 09/13/2016  . History of chicken pox   . History of shingles   . Preventative health care 08/14/2013  . Anxiety   . History of kidney stones   . GERD (gastroesophageal reflux disease)   . Type 2 diabetes mellitus with complication, with long-term current use of insulin (HCC)   . Hypertension   . Osteoarthritis   . 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 Anxiety   . CAD in native artery    a. cath 06/26/2019 - 80% apical LAD >> medical management   . Chronic headaches   . Chronic neck pain   . Coronary artery disease involving native coronary artery of native heart without angina pectoris 11/06/2019  . GERD (gastroesophageal reflux disease)    occasional - OTC as needed  . History of chicken pox   . History of kidney stones   . History of shingles   . Hypertension    under control with med., had been on med. since age 12  . Insomnia 09/13/2016  . Insulin dependent diabetes mellitus    Follows with endocrinology, Dr Chestine Spore reports his last hgba1c was 7.3   . Non-insulin dependent type 2 diabetes mellitus (HCC)   . Non-ST elevation (NSTEMI) myocardial infarction (HCC) 06/26/2019  . Obesity 01/30/2017  . Occipital neuralgia   . Osteoarthritis    right knee  . PONV (postoperative nausea and vomiting)   . PONV (postoperative nausea and vomiting)   . Preventative health care 08/14/2013  . 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 SVT (supraventricular tachycardia) (HCC) 06/26/2019  . Tachycardia   . TIA (transient ischemic attack) 11/03/2018  . Type 2 diabetes mellitus with complication, with long-term current use of insulin (HCC)    Follows with endocrinology, Dr Chestine Spore reports his last hgba1c was 7.3   . Urinary frequency 12/11/2016    Past Surgical History:  Procedure Laterality Date  . ANTERIOR CERVICAL DECOMP/DISCECTOMY FUSION N/A 01/16/2018   Procedure: ANTERIOR CERVICAL DECOMPRESSION/DISCECTOMY FUSION CERVICAL FOUR-FIVE, CERVICAL FIVE-SIX;  Surgeon: Lisbeth Renshaw, MD;  Location: [facility] OR;  Service: Neurosurgery;  Laterality: N/A;  ANTERIOR CERVICAL DECOMPRESSION/DISCECTOMY FUSION CERVICAL FOUR-FIVE, CERVICAL FIVE-SIX  . CHOLECYSTECTOMY  1980's  . CYSTOSCOPY/RETROGRADE/URETEROSCOPY/STONE EXTRACTION WITH BASKET Right 08/27/2003   and double J stent placement  .  ELECTROPHYSIOLOGY STUDY N/A 07/23/2019   Procedure: ELECTROPHYSIOLOGY STUDY;  Surgeon: Marinus Maw, MD;  Location: [facility] INVASIVE CV LAB;  Service: Cardiovascular;  Laterality: N/A;  . INGUINAL HERNIA REPAIR Left 1990's  . KNEE ARTHROSCOPY Left 1980's  . KNEE ARTHROSCOPY Right 2013  . KNEE ARTHROSCOPY Right 06/30/2013   Procedure: RIGHT KNEE ARTHROSCOPY AND SYNOVECTOMY;  Surgeon: Velna Ochs, MD;  Location: [facility];  Service: Orthopedics;  Laterality: Right;  . LEFT HEART CATH AND CORONARY ANGIOGRAPHY N/A 06/26/2019   Procedure: LEFT HEART CATH AND CORONARY ANGIOGRAPHY;  Surgeon: Marykay Lex, MD;  Location: [facility] INVASIVE CV LAB;  Service: Cardiovascular;  Laterality: N/A;  . LITHOTRIPSY     multiple, b/l  . SHOULDER ADHESION RELEASE Left ` 1998  . SHOULDER ARTHROSCOPY W/ ROTATOR CUFF REPAIR Left 10/20/2004   x 4, had to have part of clavile removed. torn rotator cuff multiple times, had to place screws   .  SHOULDER ARTHROSCOPY W/ SUPERIOR LABRAL ANTERIOR POSTERIOR LESION REPAIR Left 12/22/2004  . SVT ABLATION N/A 07/23/2019   Procedure: SVT ABLATION;  Surgeon: Marinus Maw, MD;  Location: [facility] INVASIVE CV LAB;  Service: Cardiovascular;  Laterality: N/A;  . TOTAL KNEE ARTHROPLASTY Right 12/23/2012   x3, 2 arthroscopies and a TKR,  TOTAL KNEE ARTHROPLASTY;  Surgeon: Velna Ochs, MD;  Location: [facility] OR;  Service: Orthopedics;  Laterality: Right;  DEPUY, RNFA  . WRIST SURGERY Right    x 3, torn ligaments, pins placed then infected, then fused with plate    Social History   Tobacco Use  . Smoking status: Never Smoker  . Smokeless tobacco: Current User    Types: Snuff  Vaping Use  . Vaping Use: Never used  Substance Use Topics  . Alcohol use: No  . Drug use: No    Family History  Problem Relation Age of Onset  . COPD Mother        smoker  . Hypertension Mother   . Heart disease Mother        CHF  . Diabetes Father   . Heart disease Father        CABG  in 15's  . Stroke Father   . Hypertension Father   . Peripheral vascular disease Father   . Hypertension Son        tachycardia  . Heart disease Maternal Grandfather   . Arthritis Paternal Grandmother   . Colon cancer Neg Hx   . Rectal cancer Neg Hx   . Stomach cancer Neg Hx     Allergies  Allergen Reactions  . Rosuvastatin Other (See Comments)  . Toradol [Ketorolac Tromethamine] Itching    Medication list has been reviewed and updated.  Current Outpatient Medications on File Prior to Visit  Medication Sig Dispense Refill  . aspirin EC 81 MG EC tablet Take 1 tablet (81 mg total) by mouth daily. 90 tablet 3  . clopidogrel (PLAVIX) 75 MG tablet Take 1 tablet (75 mg total) by mouth daily with breakfast. 30 tablet 6  . Coenzyme Q10 (COQ-10) 75 MG CAPS Take 75 mg by mouth daily.  0  . diclofenac Sodium (VOLTAREN) 1 % GEL Apply 2 g topically Nightly.    . Dulaglutide (TRULICITY) 4.5 MG/0.5ML SOPN Inject 4.5 mg as directed once a week. 12 pen 3  . escitalopram (LEXAPRO) 20 MG tablet Take 1 tablet by mouth once daily 30 tablet 0  . EUTHYROX 25 MCG tablet TAKE 1 TABLET BY MOUTH ONCE DAILY BEFORE BREAKFAST 90 tablet 0  . insulin aspart (NOVOLOG FLEXPEN) 100 UNIT/ML FlexPen Inject 25-35 units 3 times daily with meals 30 mL 5  . insulin detemir (LEVEMIR) 100 UNIT/ML injection Inject 0.9 mLs (90 Units total) into the skin at bedtime. 81 mL 1  . isosorbide mononitrate (IMDUR) 30 MG 24 hr tablet Take 1 tablet (30 mg total) by mouth daily. 30 tablet 6  . lansoprazole (PREVACID) 30 MG capsule Take 30 mg by mouth every morning.     Marland Kitchen levothyroxine (SYNTHROID) 25 MCG tablet TAKE 1 TABLET BY MOUTH ONCE DAILY BEFORE BREAKFAST 90 tablet 0  . lisinopril (ZESTRIL) 10 MG tablet Take 1 tablet by mouth once daily 90 tablet 1  . loratadine (CLARITIN) 10 MG tablet Take 10 mg by mouth every morning.     . metFORMIN (GLUCOPHAGE-XR) 500 MG 24 hr tablet TAKE 2 TABLETS BY MOUTH TWICE DAILY WITH MEALS 360 tablet 3   . nitroGLYCERIN (NITROSTAT)  0.4 MG SL tablet Place 1 tablet (0.4 mg total) under the tongue every 5 (five) minutes as needed. 25 tablet 12  . atorvastatin (LIPITOR) 40 MG tablet Take 1 tablet (40 mg total) by mouth daily. 90 tablet 3  . metoprolol succinate (TOPROL-XL) 50 MG 24 hr tablet Take 1 tablet (50 mg total) by mouth daily. Take with or immediately following a meal. 90 tablet 3   No current facility-administered medications on file prior to visit.    Review of Systems:  As per HPI- otherwise negative.   Physical Examination: Vitals:   04/21/20 1411  BP: 122/88  Pulse: 67  Resp: 16  SpO2: 97%   Vitals:   04/21/20 1411  Weight: 219 lb (99.3 kg)  Height: 6' (1.829 m)   Body mass index is 29.7 kg/m. Ideal Body Weight: Weight in (lb) to have BMI = 25: 183.9  GEN: no acute distress. Mild overweight, looks well  HEENT: Atraumatic, Normocephalic.   Bilateral TM wnl, oropharynx normal.  PEERL,EOMI.   Ears and Nose: No external deformity. CV: RRR, No M/G/R. No JVD. No thrill. No extra heart sounds. PULM: CTA B, no wheezes, crackles, rhonchi. No retractions. No resp. distress. No accessory muscle use. ABD: S, NT, ND, +BS. No rebound. No HSM. EXTR: No c/c/e PSYCH: Normally interactive. Conversant.  Foot exam- normal pulses and sensation Dead skin on plantar surface of both feet S/p right knee replacement Rotator cuff x4 on the left  Cervical spine surgery Right wrist fixation with plate, does not move Restricted ROM right shoulder  Assessment and Plan: Essential hypertension - Plan: CBC, Comprehensive metabolic panel, CANCELED: CBC, CANCELED: Comprehensive metabolic panel  Type 2 diabetes mellitus with complication, with long-term current use of insulin (HCC)  Mixed hyperlipidemia - Plan: Lipid panel, CANCELED: Lipid panel  Non-ST elevation (NSTEMI) myocardial infarction (HCC)  Screening for prostate cancer - Plan: PSA, CANCELED: PSA  Encounter for hepatitis C  screening test for low risk patient - Plan: Hepatitis C antibody, CANCELED: Hepatitis C antibody  Restless legs syndrome - Plan: rOPINIRole (REQUIP) 0.5 MG tablet  Chronic joint pain - Plan: acetaminophen-codeine (TYLENOL #3) 300-30 MG tablet  Pt here today with a few concerns BP under ok control, continue current medications Working on DM control with endocrinology Check PSA today, hep C screening Will try requip for RLS- discussed how to taper up this medication gradually Pt has significant MSK disease, suffering from chronic pain. Gave a small supply of tylenol #3 to use when needed for severe pain No narcotics filled since last year otherwise  Will plan further follow- up pending labs.    This visit occurred during the SARS-CoV-2 public health emergency.  Safety protocols were in place, including screening questions prior to the visit, additional usage of staff PPE, and extensive cleaning of exam room while observing appropriate contact time as indicated for disinfecting solutions.    Signed Abbe Amsterdam, MD  Procedures labs as below 9/17.  Message to patient   Blood counts are normal Metabolic profile is normal except for elevated blood sugar-however, I do not think you were fasting at the time of our lab check Also, your bilirubin level is elevated.  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 I am going to ask the lab to add on a fractionated bilirubin if possible to confirm this Your cholesterol is still high but certainly better than before- continue lipitor  PSA  is stable from previous Lab Results  Component Value Date   PSA 0.59 04/21/2020   PSA 0.58 12/11/2016   Take care, please see me in about 4 months for follow-up visit   Results for orders placed or performed in visit on 04/21/20  CBC  Result Value Ref Range   WBC 7.0 3.8 - 10.8 Thousand/uL   RBC 5.16 4.20 - 5.80  Million/uL   Hemoglobin 16.1 13.2 - 17.1 g/dL   HCT 84.6 38 - 50 %   MCV 89.9 80.0 - 100.0 fL   MCH 31.2 27.0 - 33.0 pg   MCHC 34.7 32.0 - 36.0 g/dL   RDW 65.9 93.5 - 70.1 %   Platelets 299 140 - 400 Thousand/uL   MPV 10.0 7.5 - 12.5 fL  Comprehensive metabolic panel  Result Value Ref Range   Glucose, Bld 203 (H) 65 - 99 mg/dL   BUN 11 7 - 25 mg/dL   Creat 7.79 3.90 - 3.00 mg/dL   BUN/Creatinine Ratio NOT APPLICABLE 6 - 22 (calc)   Sodium 136 135 - 146 mmol/L   Potassium 4.9 3.5 - 5.3 mmol/L   Chloride 98 98 - 110 mmol/L   CO2 30 20 - 32 mmol/L   Calcium 10.2 8.6 - 10.3 mg/dL   Total Protein 6.9 6.1 - 8.1 g/dL   Albumin 4.6 3.6 - 5.1 g/dL   Globulin 2.3 1.9 - 3.7 g/dL (calc)   AG Ratio 2.0 1.0 - 2.5 (calc)   Total Bilirubin 2.8 (H) 0.2 - 1.2 mg/dL   Alkaline phosphatase (APISO) 86 35 - 144 U/L   AST 18 10 - 35 U/L   ALT 30 9 - 46 U/L  Lipid panel  Result Value Ref Range   Cholesterol 135 <200 mg/dL   HDL 33 (L) > OR = 40 mg/dL   Triglycerides 923 (H) <150 mg/dL   LDL Cholesterol (Calc) 68 mg/dL (calc)   Total CHOL/HDL Ratio 4.1 <5.0 (calc)   Non-HDL Cholesterol (Calc) 102 <130 mg/dL (calc)  PSA  Result Value Ref Range   PSA 0.59 < OR = 4.0 ng/mL

## 2020-04-20 NOTE — Patient Instructions (Addendum)
It was good to see you again today! I will be in touch with your labs Flu shot given today 2nd dose of covid vaccine tomorrow We will get you a Cologuard kit to screen for colon cancer  I gave you some tylenol with codeine to use as needed for more severe joint pain Use with care as this can be habit forming   We will try requip for your restless legs at night- gradually increase dose as directed.  We can go up higher on the dose if needed

## 2020-04-21 ENCOUNTER — Other Ambulatory Visit: Payer: Self-pay

## 2020-04-21 ENCOUNTER — Ambulatory Visit (INDEPENDENT_AMBULATORY_CARE_PROVIDER_SITE_OTHER): Payer: Medicare HMO | Admitting: Family Medicine

## 2020-04-21 ENCOUNTER — Encounter: Payer: Self-pay | Admitting: Family Medicine

## 2020-04-21 VITALS — BP 122/88 | HR 67 | Resp 16 | Ht 72.0 in | Wt 219.0 lb

## 2020-04-21 DIAGNOSIS — I214 Non-ST elevation (NSTEMI) myocardial infarction: Secondary | ICD-10-CM

## 2020-04-21 DIAGNOSIS — E782 Mixed hyperlipidemia: Secondary | ICD-10-CM

## 2020-04-21 DIAGNOSIS — I10 Essential (primary) hypertension: Secondary | ICD-10-CM

## 2020-04-21 DIAGNOSIS — G2581 Restless legs syndrome: Secondary | ICD-10-CM | POA: Diagnosis not present

## 2020-04-21 DIAGNOSIS — Z794 Long term (current) use of insulin: Secondary | ICD-10-CM

## 2020-04-21 DIAGNOSIS — Z23 Encounter for immunization: Secondary | ICD-10-CM | POA: Diagnosis not present

## 2020-04-21 DIAGNOSIS — Z1159 Encounter for screening for other viral diseases: Secondary | ICD-10-CM

## 2020-04-21 DIAGNOSIS — G8929 Other chronic pain: Secondary | ICD-10-CM

## 2020-04-21 DIAGNOSIS — M2550 Pain in unspecified joint: Secondary | ICD-10-CM

## 2020-04-21 DIAGNOSIS — Z125 Encounter for screening for malignant neoplasm of prostate: Secondary | ICD-10-CM

## 2020-04-21 DIAGNOSIS — E118 Type 2 diabetes mellitus with unspecified complications: Secondary | ICD-10-CM

## 2020-04-21 MED ORDER — ROPINIROLE HCL 0.5 MG PO TABS: ORAL_TABLET | ORAL | 3 refills | Status: DC

## 2020-04-21 MED ORDER — ACETAMINOPHEN-CODEINE #3 300-30 MG PO TABS: 1.0000 | ORAL_TABLET | Freq: Four times a day (QID) | ORAL | 0 refills | Status: DC | PRN

## 2020-04-22 ENCOUNTER — Encounter: Payer: Self-pay | Admitting: Family Medicine

## 2020-04-22 DIAGNOSIS — G2581 Restless legs syndrome: Secondary | ICD-10-CM | POA: Diagnosis not present

## 2020-04-22 DIAGNOSIS — E782 Mixed hyperlipidemia: Secondary | ICD-10-CM | POA: Diagnosis not present

## 2020-04-22 DIAGNOSIS — I10 Essential (primary) hypertension: Secondary | ICD-10-CM | POA: Diagnosis not present

## 2020-04-22 DIAGNOSIS — Z125 Encounter for screening for malignant neoplasm of prostate: Secondary | ICD-10-CM | POA: Diagnosis not present

## 2020-04-22 DIAGNOSIS — I214 Non-ST elevation (NSTEMI) myocardial infarction: Secondary | ICD-10-CM | POA: Diagnosis not present

## 2020-04-22 DIAGNOSIS — M2550 Pain in unspecified joint: Secondary | ICD-10-CM | POA: Diagnosis not present

## 2020-04-22 DIAGNOSIS — Z794 Long term (current) use of insulin: Secondary | ICD-10-CM | POA: Diagnosis not present

## 2020-04-22 DIAGNOSIS — Z1159 Encounter for screening for other viral diseases: Secondary | ICD-10-CM | POA: Diagnosis not present

## 2020-04-22 DIAGNOSIS — Z23 Encounter for immunization: Secondary | ICD-10-CM | POA: Diagnosis not present

## 2020-04-22 DIAGNOSIS — E118 Type 2 diabetes mellitus with unspecified complications: Secondary | ICD-10-CM | POA: Diagnosis not present

## 2020-04-22 LAB — HEPATITIS C ANTIBODY
Hepatitis C Ab: NONREACTIVE
SIGNAL TO CUT-OFF: 0.01 (ref <1.00)

## 2020-04-22 LAB — LIPID PANEL
Cholesterol: 135 mg/dL (ref <200)
HDL: 33 mg/dL — ABNORMAL LOW (ref >=40)
LDL Cholesterol (Calc): 68 mg/dL (calc)
Non-HDL Cholesterol (Calc): 102 mg/dL (calc) (ref <130)
Total CHOL/HDL Ratio: 4.1 (calc) (ref <5.0)
Triglycerides: 247 mg/dL — ABNORMAL HIGH (ref <150)

## 2020-04-22 LAB — CBC
HCT: 46.4 % (ref 38.5–50.0)
Hemoglobin: 16.1 g/dL (ref 13.2–17.1)
MCH: 31.2 pg (ref 27.0–33.0)
MCHC: 34.7 g/dL (ref 32.0–36.0)
MCV: 89.9 fL (ref 80.0–100.0)
MPV: 10 fL (ref 7.5–12.5)
Platelets: 299 10*3/uL (ref 140–400)
RBC: 5.16 10*6/uL (ref 4.20–5.80)
RDW: 12.6 % (ref 11.0–15.0)
WBC: 7 10*3/uL (ref 3.8–10.8)

## 2020-04-22 LAB — COMPREHENSIVE METABOLIC PANEL
AG Ratio: 2 (calc) (ref 1.0–2.5)
ALT: 30 U/L (ref 9–46)
AST: 18 U/L (ref 10–35)
Albumin: 4.6 g/dL (ref 3.6–5.1)
Alkaline phosphatase (APISO): 86 U/L (ref 35–144)
BUN: 11 mg/dL (ref 7–25)
CO2: 30 mmol/L (ref 20–32)
Calcium: 10.2 mg/dL (ref 8.6–10.3)
Chloride: 98 mmol/L (ref 98–110)
Creat: 0.89 mg/dL (ref 0.70–1.33)
Globulin: 2.3 g/dL (calc) (ref 1.9–3.7)
Glucose, Bld: 203 mg/dL — ABNORMAL HIGH (ref 65–99)
Potassium: 4.9 mmol/L (ref 3.5–5.3)
Sodium: 136 mmol/L (ref 135–146)
Total Bilirubin: 2.8 mg/dL — ABNORMAL HIGH (ref 0.2–1.2)
Total Protein: 6.9 g/dL (ref 6.1–8.1)

## 2020-04-22 LAB — PSA: PSA: 0.59 ng/mL (ref <=4.0)

## 2020-04-25 NOTE — Progress Notes (Signed)
Cologuard ordered for patient.  

## 2020-05-04 ENCOUNTER — Encounter: Payer: Self-pay | Admitting: Family Medicine

## 2020-05-05 ENCOUNTER — Other Ambulatory Visit: Payer: Self-pay | Admitting: Family Medicine

## 2020-05-05 ENCOUNTER — Encounter: Payer: Self-pay | Admitting: Family Medicine

## 2020-05-05 DIAGNOSIS — M2550 Pain in unspecified joint: Secondary | ICD-10-CM

## 2020-05-05 DIAGNOSIS — G8929 Other chronic pain: Secondary | ICD-10-CM

## 2020-05-05 NOTE — Telephone Encounter (Signed)
Requesting: Tylenol #3 Contract: 12/17/2014 UDS: None Last Visit: 04/21/2020 Next Visit: None scheduled Last Refill: 04/21/2020 #30 and 0RF Pt sig: 1-2 tabs q6h prn  Please Advise

## 2020-05-09 ENCOUNTER — Other Ambulatory Visit: Payer: Self-pay

## 2020-05-09 ENCOUNTER — Encounter: Payer: Self-pay | Admitting: Cardiology

## 2020-05-09 ENCOUNTER — Ambulatory Visit (INDEPENDENT_AMBULATORY_CARE_PROVIDER_SITE_OTHER): Payer: Medicare HMO | Admitting: Cardiology

## 2020-05-09 VITALS — BP 146/78 | HR 81 | Ht 72.0 in | Wt 219.0 lb

## 2020-05-09 DIAGNOSIS — E118 Type 2 diabetes mellitus with unspecified complications: Secondary | ICD-10-CM

## 2020-05-09 DIAGNOSIS — I472 Ventricular tachycardia: Secondary | ICD-10-CM

## 2020-05-09 DIAGNOSIS — R0602 Shortness of breath: Secondary | ICD-10-CM

## 2020-05-09 DIAGNOSIS — I255 Ischemic cardiomyopathy: Secondary | ICD-10-CM | POA: Diagnosis not present

## 2020-05-09 DIAGNOSIS — I2510 Atherosclerotic heart disease of native coronary artery without angina pectoris: Secondary | ICD-10-CM

## 2020-05-09 DIAGNOSIS — I4729 Other ventricular tachycardia: Secondary | ICD-10-CM

## 2020-05-09 DIAGNOSIS — E782 Mixed hyperlipidemia: Secondary | ICD-10-CM

## 2020-05-09 DIAGNOSIS — Z794 Long term (current) use of insulin: Secondary | ICD-10-CM

## 2020-05-09 DIAGNOSIS — I10 Essential (primary) hypertension: Secondary | ICD-10-CM

## 2020-05-09 MED ORDER — METOPROLOL SUCCINATE ER 25 MG PO TB24: 12.5000 mg | ORAL_TABLET | Freq: Every day | ORAL | 3 refills | Status: DC

## 2020-05-09 NOTE — Patient Instructions (Signed)
Medication Instructions:  Your physician has recommended you make the following change in your medication:   Take Toprol XL 12.5 mg in the am and 50 mg in the pm.  *If you need a refill on your cardiac medications before your next appointment, please call your pharmacy*   Lab Work: None ordered If you have labs (blood work) drawn today and your tests are completely normal, you will receive your results only by: Marland Kitchen MyChart Message (if you have MyChart) OR . A paper copy in the mail If you have any lab test that is abnormal or we need to change your treatment, we will call you to review the results.   Testing/Procedures: Your physician has requested that you have an echocardiogram. Echocardiography is a painless test that uses sound waves to create images of your heart. It provides your doctor with information about the size and shape of your heart and how well your heart's chambers and valves are working. This procedure takes approximately one hour. There are no restrictions for this procedure.    Follow-Up: At [facility], you and your health needs are our priority.  As part of our continuing mission to provide you with exceptional heart care, we have created designated Provider Care Teams.  These Care Teams include your primary Cardiologist (physician) and Advanced Practice Providers (APPs -  Physician Assistants and Nurse Practitioners) who all work together to provide you with the care you need, when you need it.  We recommend signing up for the patient portal called "MyChart".  Sign up information is provided on this After Visit Summary.  MyChart is used to connect with patients for Virtual Visits (Telemedicine).  Patients are able to view lab/test results, encounter notes, upcoming appointments, etc.  Non-urgent messages can be sent to your provider as well.   To learn more about what you can do with MyChart, go to [email or web address].    Your next appointment:   8  week(s)  The format for your next appointment:   In Person  Provider:   Belva Crome, MD   Other Instructions Metoprolol Extended-Release Tablets What is this medicine? METOPROLOL (me TOE proe lole) is a beta blocker. It decreases the amount of work your heart has to do and helps your heart beat regularly. It treats high blood pressure and/or prevent chest pain (also called angina). It also treats heart failure. This medicine may be used for other purposes; ask your health care provider or pharmacist if you have questions. COMMON BRAND NAME(S): toprol, Toprol XL What should I tell my health care provider before I take this medicine? They need to know if you have any of these conditions:  diabetes  heart or vessel disease like slow heart rate, worsening heart failure, heart block, sick sinus syndrome or Raynaud's disease  kidney disease  liver disease  lung or breathing disease, like asthma or emphysema  pheochromocytoma  thyroid disease  an unusual or allergic reaction to metoprolol, other beta-blockers, medicines, foods, dyes, or preservatives  pregnant or trying to get pregnant  breast-feeding How should I use this medicine? Take this drug by mouth. Take it as directed on the prescription label at the same time every day. Take it with food. You may cut the tablet in half if it is scored (has a line in the middle of it). This may help you swallow the tablet if the whole tablet is too big. Be sure to take both halves. Do not take just one-half of the tablet. Keep  taking it unless your health care provider tells you to stop. Talk to your health care provider about the use of this drug in children. While it may be prescribed for children as young as 6 for selected conditions, precautions do apply. Overdosage: If you think you have taken too much of this medicine contact a poison control center or emergency room at once. NOTE: This medicine is only for you. Do not share this  medicine with others. What if I miss a dose? If you miss a dose, take it as soon as you can. If it is almost time for your next dose, take only that dose. Do not take double or extra doses. What may interact with this medicine? This medicine may interact with the following medications:  certain medicines for blood pressure, heart disease, irregular heart beat  certain medicines for depression, like monoamine oxidase (MAO) inhibitors, fluoxetine, or paroxetine  clonidine  dobutamine  epinephrine  isoproterenol  reserpine This list may not describe all possible interactions. Give your health care provider a list of all the medicines, herbs, non-prescription drugs, or dietary supplements you use. Also tell them if you smoke, drink alcohol, or use illegal drugs. Some items may interact with your medicine. What should I watch for while using this medicine? Visit your doctor or health care professional for regular check ups. Contact your doctor right away if your symptoms worsen. Check your blood pressure and pulse rate regularly. Ask your health care professional what your blood pressure and pulse rate should be, and when you should contact them. You may get drowsy or dizzy. Do not drive, use machinery, or do anything that needs mental alertness until you know how this medicine affects you. Do not sit or stand up quickly, especially if you are an older patient. This reduces the risk of dizzy or fainting spells. Contact your doctor if these symptoms continue. Alcohol may interfere with the effect of this medicine. Avoid alcoholic drinks. This medicine may increase blood sugar. Ask your healthcare provider if changes in diet or medicines are needed if you have diabetes. What side effects may I notice from receiving this medicine? Side effects that you should report to your doctor or health care professional as soon as possible:  allergic reactions like skin rash, itching or hives  cold or numb  hands or feet  depression  difficulty breathing  faint  fever with sore throat  irregular heartbeat, chest pain  rapid weight gain   signs and symptoms of high blood sugar such as being more thirsty or hungry or having to urinate more than normal. You may also feel very tired or have blurry vision.  swollen legs or ankles Side effects that usually do not require medical attention (report to your doctor or health care professional if they continue or are bothersome):  anxiety or nervousness  change in sex drive or performance  dry skin  headache  nightmares or trouble sleeping  short term memory loss  stomach upset or diarrhea This list may not describe all possible side effects. Call your doctor for medical advice about side effects. You may report side effects to FDA at 1-800-FDA-1088. Where should I keep my medicine? Keep out of the reach of children and pets. Store at room temperature between 20 and 25 degrees C (68 and 77 degrees F). Throw away any unused drug after the expiration date. NOTE: This sheet is a summary. It may not cover all possible information. If you have questions about this  medicine, talk to your doctor, pharmacist, or health care provider.  2020 Elsevier/Gold Standard (2019-03-05 18:23:00)  Echocardiogram An echocardiogram is a procedure that uses painless sound waves (ultrasound) to produce an image of the heart. Images from an echocardiogram can provide important information about:  Signs of coronary artery disease (CAD).  Aneurysm detection. An aneurysm is a weak or damaged part of an artery wall that bulges out from the normal force of blood pumping through the body.  Heart size and shape. Changes in the size or shape of the heart can be associated with certain conditions, including heart failure, aneurysm, and CAD.  Heart muscle function.  Heart valve function.  Signs of a past heart attack.  Fluid buildup around the  heart.  Thickening of the heart muscle.  A tumor or infectious growth around the heart valves. Tell a health care provider about:  Any allergies you have.  All medicines you are taking, including vitamins, herbs, eye drops, creams, and over-the-counter medicines.  Any blood disorders you have.  Any surgeries you have had.  Any medical conditions you have.  Whether you are pregnant or may be pregnant. What are the risks? Generally, this is a safe procedure. However, problems may occur, including:  Allergic reaction to dye (contrast) that may be used during the procedure. What happens before the procedure? No specific preparation is needed. You may eat and drink normally. What happens during the procedure?   An IV tube may be inserted into one of your veins.  You may receive contrast through this tube. A contrast is an injection that improves the quality of the pictures from your heart.  A gel will be applied to your chest.  A wand-like tool (transducer) will be moved over your chest. The gel will help to transmit the sound waves from the transducer.  The sound waves will harmlessly bounce off of your heart to allow the heart images to be captured in real-time motion. The images will be recorded on a computer. The procedure may vary among health care providers and hospitals. What happens after the procedure?  You may return to your normal, everyday life, including diet, activities, and medicines, unless your health care provider tells you not to do that. Summary  An echocardiogram is a procedure that uses painless sound waves (ultrasound) to produce an image of the heart.  Images from an echocardiogram can provide important information about the size and shape of your heart, heart muscle function, heart valve function, and fluid buildup around your heart.  You do not need to do anything to prepare before this procedure. You may eat and drink normally.  After the  echocardiogram is completed, you may return to your normal, everyday life, unless your health care provider tells you not to do that. This information is not intended to replace advice given to you by your health care provider. Make sure you discuss any questions you have with your health care provider. Document Revised: 11/13/2018 Document Reviewed: 08/25/2016 Elsevier Patient Education  2020 [employer].

## 2020-05-09 NOTE — Progress Notes (Signed)
Cardiology Office Note:    Date:  05/09/2020   ID:  Sean Horn, DOB 1960/10/19, MRN [id number]  PCP:  Pearline Cables, MD  Cardiologist:  Thomasene Ripple, DO  Electrophysiologist:  None   Referring MD: Pearline Cables, MD   Chief Complaint  Patient presents with  . Shortness of Breath    while laying on couch   History of Present Illness:    Sean Horn a 58 y.o.malewith a hx of hypertension, NSTEMI/coronary artery disease status post left heart cath June 26, 2019 with 80% apical LAD lesions which has been stated for medical management due to this lesion being a unsuitable candidate for PCI, NSVT seen on monitor, SVT/AVNRT status post ablation on July 23, 2019.  I did see the patient on March 23, 2020 at that time he reported that he had a syncope episode.  Given this we placed a monitor and the patient.  He did wear the monitor the first 1 for 1 day and 3 hours which showed nonsustained ventricular tachycardia, because this fell out we will send the patient another monitor which he wore for longer duration and it was unremarkable.  He is here today for follow-up visit as he has had some intermittent shortness of breath.  He is concerned about this.  No other complaints at this time as he had not had any repeated syncope episode.  Past Medical History:  Diagnosis Date  . Anxiety   . CAD in native artery    a. cath 06/26/2019 - 80% apical LAD >> medical management   . Chronic headaches   . Chronic neck pain   . Coronary artery disease involving native coronary artery of native heart without angina pectoris 11/06/2019  . GERD (gastroesophageal reflux disease)    occasional - OTC as needed  . History of chicken pox   . History of kidney stones   . History of shingles   . Hypertension    under control with med., had been on med. since age 26  . Insomnia 09/13/2016  . Insulin dependent diabetes mellitus    Follows with endocrinology, Dr Chestine Spore reports his  last hgba1c was 7.3   . Non-insulin dependent type 2 diabetes mellitus (HCC)   . Non-ST elevation (NSTEMI) myocardial infarction (HCC) 06/26/2019  . Obesity 01/30/2017  . Occipital neuralgia   . Osteoarthritis    right Horn  . PONV (postoperative nausea and vomiting)   . PONV (postoperative nausea and vomiting)   . Preventative health care 08/14/2013  . 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 SVT (supraventricular tachycardia) (HCC) 06/26/2019  . Tachycardia   . TIA (transient ischemic attack) 11/03/2018  . Type 2 diabetes mellitus with complication, with long-term current use of insulin (HCC)    Follows with endocrinology, Dr Chestine Spore reports his last hgba1c was 7.3   . Urinary frequency 12/11/2016    Past Surgical History:  Procedure Laterality Date  . ANTERIOR CERVICAL DECOMP/DISCECTOMY FUSION N/A 01/16/2018   Procedure: ANTERIOR CERVICAL DECOMPRESSION/DISCECTOMY FUSION CERVICAL FOUR-FIVE, CERVICAL FIVE-SIX;  Surgeon: Lisbeth Renshaw, MD;  Location: [facility] OR;  Service: Neurosurgery;  Laterality: N/A;  ANTERIOR CERVICAL DECOMPRESSION/DISCECTOMY FUSION CERVICAL FOUR-FIVE, CERVICAL FIVE-SIX  . CHOLECYSTECTOMY  1980's  . CYSTOSCOPY/RETROGRADE/URETEROSCOPY/STONE EXTRACTION WITH BASKET Right 08/27/2003   and double J stent placement  . ELECTROPHYSIOLOGY STUDY N/A 07/23/2019   Procedure: ELECTROPHYSIOLOGY STUDY;  Surgeon: Marinus Maw, MD;  Location: [facility] INVASIVE CV LAB;  Service: Cardiovascular;  Laterality: N/A;  .  INGUINAL HERNIA REPAIR Left 1990's  . Horn ARTHROSCOPY Left 1980's  . Horn ARTHROSCOPY Right 2013  . Horn ARTHROSCOPY Right 06/30/2013   Procedure: RIGHT Horn ARTHROSCOPY AND SYNOVECTOMY;  Surgeon: Velna Ochs, MD;  Location: [facility];  Service: Orthopedics;  Laterality: Right;  . LEFT HEART CATH AND CORONARY ANGIOGRAPHY N/A 06/26/2019   Procedure: LEFT HEART CATH AND CORONARY ANGIOGRAPHY;  Surgeon: Marykay Lex, MD;  Location: [facility] INVASIVE CV LAB;   Service: Cardiovascular;  Laterality: N/A;  . LITHOTRIPSY     multiple, b/l  . SHOULDER ADHESION RELEASE Left ` 1998  . SHOULDER ARTHROSCOPY W/ ROTATOR CUFF REPAIR Left 10/20/2004   x 4, had to have part of clavile removed. torn rotator cuff multiple times, had to place screws   . SHOULDER ARTHROSCOPY W/ SUPERIOR LABRAL ANTERIOR POSTERIOR LESION REPAIR Left 12/22/2004  . SVT ABLATION N/A 07/23/2019   Procedure: SVT ABLATION;  Surgeon: Marinus Maw, MD;  Location: [facility] INVASIVE CV LAB;  Service: Cardiovascular;  Laterality: N/A;  . TOTAL Horn ARTHROPLASTY Right 12/23/2012   x3, 2 arthroscopies and a TKR,  TOTAL Horn ARTHROPLASTY;  Surgeon: Velna Ochs, MD;  Location: [facility] OR;  Service: Orthopedics;  Laterality: Right;  DEPUY, RNFA  . WRIST SURGERY Right    x 3, torn ligaments, pins placed then infected, then fused with plate    Current Medications: Current Meds  Medication Sig  . acetaminophen-codeine (TYLENOL #3) 300-30 MG tablet TAKE 1 TO 2 TABLETS BY MOUTH EVERY 6 HOURS AS NEEDED FOR MODERATE PAIN  . aspirin EC 81 MG EC tablet Take 1 tablet (81 mg total) by mouth daily.  Marland Kitchen atorvastatin (LIPITOR) 40 MG tablet Take 1 tablet (40 mg total) by mouth daily.  . clopidogrel (PLAVIX) 75 MG tablet Take 1 tablet (75 mg total) by mouth daily with breakfast.  . Coenzyme Q10 (COQ-10) 75 MG CAPS Take 75 mg by mouth daily.  . diclofenac Sodium (VOLTAREN) 1 % GEL Apply 2 g topically Nightly.  . Dulaglutide (TRULICITY) 4.5 MG/0.5ML SOPN Inject 4.5 mg as directed once a week.  . escitalopram (LEXAPRO) 20 MG tablet Take 1 tablet by mouth once daily  . EUTHYROX 25 MCG tablet TAKE 1 TABLET BY MOUTH ONCE DAILY BEFORE BREAKFAST  . insulin aspart (NOVOLOG FLEXPEN) 100 UNIT/ML FlexPen Inject 25-35 units 3 times daily with meals  . insulin detemir (LEVEMIR) 100 UNIT/ML injection Inject 0.9 mLs (90 Units total) into the skin at bedtime.  . isosorbide mononitrate (IMDUR) 30 MG 24 hr tablet Take 1 tablet (30 mg  total) by mouth daily.  . lansoprazole (PREVACID) 30 MG capsule Take 30 mg by mouth every morning.   Marland Kitchen levothyroxine (SYNTHROID) 25 MCG tablet TAKE 1 TABLET BY MOUTH ONCE DAILY BEFORE BREAKFAST  . lisinopril (ZESTRIL) 10 MG tablet Take 1 tablet by mouth once daily  . loratadine (CLARITIN) 10 MG tablet Take 10 mg by mouth every morning.   . metFORMIN (GLUCOPHAGE-XR) 500 MG 24 hr tablet TAKE 2 TABLETS BY MOUTH TWICE DAILY WITH MEALS  . metoprolol succinate (TOPROL-XL) 50 MG 24 hr tablet Take 1 tablet (50 mg total) by mouth daily. Take with or immediately following a meal.  . nitroGLYCERIN (NITROSTAT) 0.4 MG SL tablet Place 1 tablet (0.4 mg total) under the tongue every 5 (five) minutes as needed.  Marland Kitchen rOPINIRole (REQUIP) 0.5 MG tablet Start with 1/2 tablet at bedtime, increase to 1 tablet after 2 days, then can increase to 2 tablets after  a week     Allergies:   Rosuvastatin and Toradol [ketorolac tromethamine]   Social History   Socioeconomic History  . Marital status: Single    Spouse name: Not on file  . Number of children: 2  . Years of education: Not on file  . Highest education level: High school graduate  Occupational History  . Occupation: Disabled  Tobacco Use  . Smoking status: Never Smoker  . Smokeless tobacco: Current User    Types: Snuff  Vaping Use  . Vaping Use: Never used  Substance and Sexual Activity  . Alcohol use: No  . Drug use: No  . Sexual activity: Yes  Other Topics Concern  . Not on file  Social History Narrative   Lives at home with his father (he takes care of his father)   Right handed   Drinks 2 cups of caffeine daily   Social Determinants of Health   Financial Resource Strain:   . Difficulty of Paying Living Expenses: Not on file  Food Insecurity:   . Worried About Programme researcher, broadcasting/film/video in the Last Year: Not on file  . Ran Out of Food in the Last Year: Not on file  Transportation Needs:   . Lack of Transportation (Medical): Not on file  . Lack  of Transportation (Non-Medical): Not on file  Physical Activity:   . Days of Exercise per Week: Not on file  . Minutes of Exercise per Session: Not on file  Stress:   . Feeling of Stress : Not on file  Social Connections:   . Frequency of Communication with Friends and Family: Not on file  . Frequency of Social Gatherings with Friends and Family: Not on file  . Attends Religious Services: Not on file  . Active Member of Clubs or Organizations: Not on file  . Attends Banker Meetings: Not on file  . Marital Status: Not on file     Family History: The patient's family history includes Arthritis in his paternal grandmother; COPD in his mother; Diabetes in his father; Heart disease in his father, maternal grandfather, and mother; Hypertension in his father, mother, and son; Peripheral vascular disease in his father; Stroke in his father. There is no history of Colon cancer, Rectal cancer, or Stomach cancer.  ROS:   Review of Systems  Constitution: Negative for decreased appetite, fever and weight gain.  HENT: Negative for congestion, ear discharge, hoarse voice and sore throat.   Eyes: Negative for discharge, redness, vision loss in right eye and visual halos.  Cardiovascular: Negative for chest pain, dyspnea on exertion, leg swelling, orthopnea and palpitations.  Respiratory: Negative for cough, hemoptysis, shortness of breath and snoring.   Endocrine: Negative for heat intolerance and polyphagia.  Hematologic/Lymphatic: Negative for bleeding problem. Does not bruise/bleed easily.  Skin: Negative for flushing, nail changes, rash and suspicious lesions.  Musculoskeletal: Negative for arthritis, joint pain, muscle cramps, myalgias, neck pain and stiffness.  Gastrointestinal: Negative for abdominal pain, bowel incontinence, diarrhea and excessive appetite.  Genitourinary: Negative for decreased libido, genital sores and incomplete emptying.  Neurological: Negative for brief  paralysis, focal weakness, headaches and loss of balance.  Psychiatric/Behavioral: Negative for altered mental status, depression and suicidal ideas.  Allergic/Immunologic: Negative for HIV exposure and persistent infections.    EKGs/Labs/Other Studies Reviewed:    The following studies were reviewed today:   EKG:  The ekg ordered today demonstrates   Recent Labs: 06/25/2019: TSH 1.714 04/21/2020: ALT 30; BUN 11; Creat 0.89;  Hemoglobin 16.1; Platelets 299; Potassium 4.9; Sodium 136  Recent Lipid Panel    Component Value Date/Time   CHOL 135 04/21/2020 1448   TRIG 247 (H) 04/21/2020 1448   HDL 33 (L) 04/21/2020 1448   CHOLHDL 4.1 04/21/2020 1448   VLDL 51 (H) 06/26/2019 0409   LDLCALC 68 04/21/2020 1448   LDLDIRECT 128.0 12/11/2016 1622    Physical Exam:    VS:  BP (!) 146/78 (BP Location: Right Arm, Patient Position: Sitting, Cuff Size: Normal)   Pulse 81   Ht 6' (1.829 m)   Wt 219 lb (99.3 kg)   SpO2 97%   BMI 29.70 kg/m     Wt Readings from Last 3 Encounters:  05/09/20 219 lb (99.3 kg)  04/21/20 219 lb (99.3 kg)  04/14/20 220 lb (99.8 kg)     GEN: Well nourished, well developed in no acute distress HEENT: Normal NECK: No JVD; No carotid bruits LYMPHATICS: No lymphadenopathy CARDIAC: S1S2 noted,RRR, no murmurs, rubs, gallops RESPIRATORY:  Clear to auscultation without rales, wheezing or rhonchi  ABDOMEN: Soft, non-tender, non-distended, +bowel sounds, no guarding. EXTREMITIES: No edema, No cyanosis, no clubbing MUSCULOSKELETAL:  No deformity  SKIN: Warm and dry NEUROLOGIC:  Alert and oriented x 3, non-focal PSYCHIATRIC:  Normal affect, good insight  ASSESSMENT:    1. Primary hypertension   2. Coronary artery disease involving native coronary artery of native heart without angina pectoris   3. Type 2 diabetes mellitus with complication, with long-term current use of insulin (HCC)   4. Mixed hyperlipidemia   5. NSVT (nonsustained ventricular tachycardia)  (HCC)    PLAN:     Also we discussed the patient monitors with him.  Due to NSVT on his current monitor I am increasing his beta-blocker from 50 mg daily to 12.5 mg in a.m. and 50 mg in nighttime.  In addition I am concerned about his shortness of breath with his history ischemic cardiomyopathy I like to repeat an echocardiogram to make sure that his EF has not dropped and there is no valvular abnormality that is worsened.  In the meantime I was able to connect with our interventional partners to review the patient's catheterization which was done in November 2020 to reassess the LAD.  But unfortunately it remains that this vessel is a small caliber vessel and where of the analysis this is not suitable for revascularization for PCI.  We will continue medical management.  His blood pressure is slightly elevated in the office.  Hopefully with the increase in beta-blocker will have some improvement.  If not we will adjust this at his next follow-up visit.  The patient is in agreement with the above plan. The patient left the office in stable condition.  The patient will follow up in 8 weeks or sooner if needed.   Medication Adjustments/Labs and Tests Ordered: Current medicines are reviewed at length with the patient today.  Concerns regarding medicines are outlined above.  No orders of the defined types were placed in this encounter.  No orders of the defined types were placed in this encounter.   Patient Instructions  Medication Instructions:  Your physician has recommended you make the following change in your medication:   Take Toprol XL 12.5 mg in the am and 50 mg in the pm.  *If you need a refill on your cardiac medications before your next appointment, please call your pharmacy*   Lab Work: None ordered If you have labs (blood work) drawn today and your tests  are completely normal, you will receive your results only by: Marland Kitchen. MyChart Message (if you have MyChart) OR . A paper copy in  the mail If you have any lab test that is abnormal or we need to change your treatment, we will call you to review the results.   Testing/Procedures: Your physician has requested that you have an echocardiogram. Echocardiography is a painless test that uses sound waves to create images of your heart. It provides your doctor with information about the size and shape of your heart and how well your heart's chambers and valves are working. This procedure takes approximately one hour. There are no restrictions for this procedure.    Follow-Up: At [facility], you and your health needs are our priority.  As part of our continuing mission to provide you with exceptional heart care, we have created designated Provider Care Teams.  These Care Teams include your primary Cardiologist (physician) and Advanced Practice Providers (APPs -  Physician Assistants and Nurse Practitioners) who all work together to provide you with the care you need, when you need it.  We recommend signing up for the patient portal called "MyChart".  Sign up information is provided on this After Visit Summary.  MyChart is used to connect with patients for Virtual Visits (Telemedicine).  Patients are able to view lab/test results, encounter notes, upcoming appointments, etc.  Non-urgent messages can be sent to your provider as well.   To learn more about what you can do with MyChart, go to [email or web address].    Your next appointment:   8 week(s)  The format for your next appointment:   In Person  Provider:   Belva Cromeajan Revankar, MD   Other Instructions Metoprolol Extended-Release Tablets What is this medicine? METOPROLOL (me TOE proe lole) is a beta blocker. It decreases the amount of work your heart has to do and helps your heart beat regularly. It treats high blood pressure and/or prevent chest pain (also called angina). It also treats heart failure. This medicine may be used for other purposes; ask your health care  provider or pharmacist if you have questions. COMMON BRAND NAME(S): toprol, Toprol XL What should I tell my health care provider before I take this medicine? They need to know if you have any of these conditions:  diabetes  heart or vessel disease like slow heart rate, worsening heart failure, heart block, sick sinus syndrome or Raynaud's disease  kidney disease  liver disease  lung or breathing disease, like asthma or emphysema  pheochromocytoma  thyroid disease  an unusual or allergic reaction to metoprolol, other beta-blockers, medicines, foods, dyes, or preservatives  pregnant or trying to get pregnant  breast-feeding How should I use this medicine? Take this drug by mouth. Take it as directed on the prescription label at the same time every day. Take it with food. You may cut the tablet in half if it is scored (has a line in the middle of it). This may help you swallow the tablet if the whole tablet is too big. Be sure to take both halves. Do not take just one-half of the tablet. Keep taking it unless your health care provider tells you to stop. Talk to your health care provider about the use of this drug in children. While it may be prescribed for children as young as 6 for selected conditions, precautions do apply. Overdosage: If you think you have taken too much of this medicine contact a poison control center or emergency room at once. NOTE:  This medicine is only for you. Do not share this medicine with others. What if I miss a dose? If you miss a dose, take it as soon as you can. If it is almost time for your next dose, take only that dose. Do not take double or extra doses. What may interact with this medicine? This medicine may interact with the following medications:  certain medicines for blood pressure, heart disease, irregular heart beat  certain medicines for depression, like monoamine oxidase (MAO) inhibitors, fluoxetine, or  paroxetine  clonidine  dobutamine  epinephrine  isoproterenol  reserpine This list may not describe all possible interactions. Give your health care provider a list of all the medicines, herbs, non-prescription drugs, or dietary supplements you use. Also tell them if you smoke, drink alcohol, or use illegal drugs. Some items may interact with your medicine. What should I watch for while using this medicine? Visit your doctor or health care professional for regular check ups. Contact your doctor right away if your symptoms worsen. Check your blood pressure and pulse rate regularly. Ask your health care professional what your blood pressure and pulse rate should be, and when you should contact them. You may get drowsy or dizzy. Do not drive, use machinery, or do anything that needs mental alertness until you know how this medicine affects you. Do not sit or stand up quickly, especially if you are an older patient. This reduces the risk of dizzy or fainting spells. Contact your doctor if these symptoms continue. Alcohol may interfere with the effect of this medicine. Avoid alcoholic drinks. This medicine may increase blood sugar. Ask your healthcare provider if changes in diet or medicines are needed if you have diabetes. What side effects may I notice from receiving this medicine? Side effects that you should report to your doctor or health care professional as soon as possible:  allergic reactions like skin rash, itching or hives  cold or numb hands or feet  depression  difficulty breathing  faint  fever with sore throat  irregular heartbeat, chest pain  rapid weight gain   signs and symptoms of high blood sugar such as being more thirsty or hungry or having to urinate more than normal. You may also feel very tired or have blurry vision.  swollen legs or ankles Side effects that usually do not require medical attention (report to your doctor or health care professional if they  continue or are bothersome):  anxiety or nervousness  change in sex drive or performance  dry skin  headache  nightmares or trouble sleeping  short term memory loss  stomach upset or diarrhea This list may not describe all possible side effects. Call your doctor for medical advice about side effects. You may report side effects to FDA at 1-800-FDA-1088. Where should I keep my medicine? Keep out of the reach of children and pets. Store at room temperature between 20 and 25 degrees C (68 and 77 degrees F). Throw away any unused drug after the expiration date. NOTE: This sheet is a summary. It may not cover all possible information. If you have questions about this medicine, talk to your doctor, pharmacist, or health care provider.  2020 Elsevier/Gold Standard (2019-03-05 18:23:00)  Echocardiogram An echocardiogram is a procedure that uses painless sound waves (ultrasound) to produce an image of the heart. Images from an echocardiogram can provide important information about:  Signs of coronary artery disease (CAD).  Aneurysm detection. An aneurysm is a weak or damaged part of an  artery wall that bulges out from the normal force of blood pumping through the body.  Heart size and shape. Changes in the size or shape of the heart can be associated with certain conditions, including heart failure, aneurysm, and CAD.  Heart muscle function.  Heart valve function.  Signs of a past heart attack.  Fluid buildup around the heart.  Thickening of the heart muscle.  A tumor or infectious growth around the heart valves. Tell a health care provider about:  Any allergies you have.  All medicines you are taking, including vitamins, herbs, eye drops, creams, and over-the-counter medicines.  Any blood disorders you have.  Any surgeries you have had.  Any medical conditions you have.  Whether you are pregnant or may be pregnant. What are the risks? Generally, this is a safe  procedure. However, problems may occur, including:  Allergic reaction to dye (contrast) that may be used during the procedure. What happens before the procedure? No specific preparation is needed. You may eat and drink normally. What happens during the procedure?   An IV tube may be inserted into one of your veins.  You may receive contrast through this tube. A contrast is an injection that improves the quality of the pictures from your heart.  A gel will be applied to your chest.  A wand-like tool (transducer) will be moved over your chest. The gel will help to transmit the sound waves from the transducer.  The sound waves will harmlessly bounce off of your heart to allow the heart images to be captured in real-time motion. The images will be recorded on a computer. The procedure may vary among health care providers and hospitals. What happens after the procedure?  You may return to your normal, everyday life, including diet, activities, and medicines, unless your health care provider tells you not to do that. Summary  An echocardiogram is a procedure that uses painless sound waves (ultrasound) to produce an image of the heart.  Images from an echocardiogram can provide important information about the size and shape of your heart, heart muscle function, heart valve function, and fluid buildup around your heart.  You do not need to do anything to prepare before this procedure. You may eat and drink normally.  After the echocardiogram is completed, you may return to your normal, everyday life, unless your health care provider tells you not to do that. This information is not intended to replace advice given to you by your health care provider. Make sure you discuss any questions you have with your health care provider. Document Revised: 11/13/2018 Document Reviewed: 08/25/2016 Elsevier Patient Education  2020 [employer].      Adopting a Healthy Lifestyle.  Know what a healthy  weight is for you (roughly BMI <25) and aim to maintain this   Aim for 7+ servings of fruits and vegetables daily   65-80+ fluid ounces of water or unsweet tea for healthy kidneys   Limit to max 1 drink of alcohol per day; avoid smoking/tobacco   Limit animal fats in diet for cholesterol and heart health - choose grass fed whenever available   Avoid highly processed foods, and foods high in saturated/trans fats   Aim for low stress - take time to unwind and care for your mental health   Aim for 150 min of moderate intensity exercise weekly for heart health, and weights twice weekly for bone health   Aim for 7-9 hours of sleep daily   When it comes  to diets, agreement about the perfect plan isnt easy to find, even among the experts. Experts at the [facility] of [employer] developed an idea known as the Healthy Eating Plate. Just imagine a plate divided into logical, healthy portions.   The emphasis is on diet quality:   Load up on vegetables and fruits - one-half of your plate: Aim for color and variety, and remember that potatoes dont count.   Go for whole grains - one-quarter of your plate: Whole wheat, barley, wheat berries, quinoa, oats, brown rice, and foods made with them. If you want pasta, go with whole wheat pasta.   Protein power - one-quarter of your plate: Fish, chicken, beans, and nuts are all healthy, versatile protein sources. Limit red meat.   The diet, however, does go beyond the plate, offering a few other suggestions.   Use healthy plant oils, such as olive, canola, soy, corn, sunflower and peanut. Check the labels, and avoid partially hydrogenated oil, which have unhealthy trans fats.   If youre thirsty, drink water. Coffee and tea are good in moderation, but skip sugary drinks and limit milk and dairy products to one or two daily servings.   The type of carbohydrate in the diet is more important than the amount. Some sources of carbohydrates, such as  vegetables, fruits, whole grains, and beans-are healthier than others.   Finally, stay active  Signed, Thomasene Ripple, DO  05/09/2020 10:16 AM    [facility]

## 2020-05-12 ENCOUNTER — Ambulatory Visit (HOSPITAL_COMMUNITY)
Admission: RE | Admit: 2020-05-12 | Discharge: 2020-05-12 | Disposition: A | Discharge status: Home / Self Care | Payer: Medicare HMO | Source: Ambulatory Visit | Attending: Cardiology | Admitting: Cardiology

## 2020-05-12 ENCOUNTER — Other Ambulatory Visit: Payer: Self-pay

## 2020-05-12 DIAGNOSIS — R0602 Shortness of breath: Secondary | ICD-10-CM

## 2020-05-12 DIAGNOSIS — I255 Ischemic cardiomyopathy: Secondary | ICD-10-CM | POA: Diagnosis not present

## 2020-05-12 LAB — ECHOCARDIOGRAM COMPLETE
AR max vel: 3.29 cm2
AV Area VTI: 3.56 cm2
AV Area mean vel: 3.26 cm2
AV Mean grad: 1.9 mmHg
AV Peak grad: 3.3 mmHg
Ao pk vel: 0.91 m/s
Area-P 1/2: 3.4 cm2
S' Lateral: 2.49 cm

## 2020-05-12 NOTE — Progress Notes (Signed)
*  PRELIMINARY RESULTS* Echocardiogram 2D Echocardiogram has been performed.  Stacey Drain 05/12/2020, 10:09 AM

## 2020-05-13 ENCOUNTER — Telehealth: Payer: Self-pay

## 2020-05-13 NOTE — Telephone Encounter (Signed)
-----   Message from Kardie Tobb, DO sent at 05/12/2020  7:05 PM EDT ----- Thankfully your heart function did not drop.  Essentially no change from your prior echo. 

## 2020-05-13 NOTE — Telephone Encounter (Signed)
Left message on patients voicemail to please return our call.   

## 2020-05-16 ENCOUNTER — Telehealth: Payer: Self-pay

## 2020-05-16 NOTE — Telephone Encounter (Signed)
Left message on patients voicemail to please return our call.   

## 2020-05-16 NOTE — Telephone Encounter (Signed)
-----   Message from Thomasene Ripple, DO sent at 05/12/2020  7:05 PM EDT ----- Thankfully your heart function did not drop.  Essentially no change from your prior echo.

## 2020-05-17 ENCOUNTER — Telehealth: Payer: Self-pay

## 2020-05-17 NOTE — Telephone Encounter (Signed)
-----   Message from Kardie Tobb, DO sent at 05/12/2020  7:05 PM EDT ----- Thankfully your heart function did not drop.  Essentially no change from your prior echo. 

## 2020-05-17 NOTE — Telephone Encounter (Signed)
Left message on patients voicemail to please return our call.   I will send the patient a letter at this time as I have tried calling him with no success x3.

## 2020-05-18 DIAGNOSIS — Z1211 Encounter for screening for malignant neoplasm of colon: Secondary | ICD-10-CM | POA: Diagnosis not present

## 2020-05-18 LAB — COLOGUARD: Cologuard: NEGATIVE

## 2020-05-28 LAB — COLOGUARD: COLOGUARD: NEGATIVE

## 2020-05-30 ENCOUNTER — Encounter: Payer: Self-pay | Admitting: Family Medicine

## 2020-06-02 ENCOUNTER — Other Ambulatory Visit: Payer: Self-pay | Admitting: Family Medicine

## 2020-07-05 ENCOUNTER — Ambulatory Visit: Payer: Medicare HMO | Admitting: Cardiology

## 2020-07-09 ENCOUNTER — Other Ambulatory Visit: Payer: Self-pay | Admitting: Cardiology

## 2020-07-09 ENCOUNTER — Other Ambulatory Visit: Payer: Self-pay | Admitting: Family Medicine

## 2020-07-09 ENCOUNTER — Other Ambulatory Visit: Payer: Self-pay | Admitting: Student

## 2020-07-09 ENCOUNTER — Other Ambulatory Visit: Payer: Self-pay | Admitting: Internal Medicine

## 2020-07-09 DIAGNOSIS — F419 Anxiety disorder, unspecified: Secondary | ICD-10-CM

## 2020-07-11 NOTE — Telephone Encounter (Signed)
Rx refill sent to pharmacy. 

## 2020-07-11 NOTE — Telephone Encounter (Signed)
This is a [facility] pt, Dr. Tobb 

## 2020-07-14 ENCOUNTER — Ambulatory Visit: Payer: Medicare HMO | Admitting: Internal Medicine

## 2020-07-20 ENCOUNTER — Other Ambulatory Visit: Payer: Self-pay | Admitting: Student

## 2020-07-20 ENCOUNTER — Other Ambulatory Visit: Payer: Self-pay | Admitting: Family Medicine

## 2020-07-20 DIAGNOSIS — G8929 Other chronic pain: Secondary | ICD-10-CM

## 2020-07-20 DIAGNOSIS — G2581 Restless legs syndrome: Secondary | ICD-10-CM

## 2020-07-21 ENCOUNTER — Encounter: Payer: Self-pay | Admitting: Family Medicine

## 2020-07-21 DIAGNOSIS — G8929 Other chronic pain: Secondary | ICD-10-CM

## 2020-07-21 DIAGNOSIS — M4802 Spinal stenosis, cervical region: Secondary | ICD-10-CM

## 2020-07-25 ENCOUNTER — Ambulatory Visit: Payer: Medicare HMO | Admitting: Cardiology

## 2020-07-26 ENCOUNTER — Encounter: Payer: Self-pay | Admitting: Cardiology

## 2020-07-26 ENCOUNTER — Encounter: Payer: Self-pay | Admitting: Physical Medicine & Rehabilitation

## 2020-08-09 ENCOUNTER — Other Ambulatory Visit: Payer: Self-pay | Admitting: Family Medicine

## 2020-08-09 DIAGNOSIS — G2581 Restless legs syndrome: Secondary | ICD-10-CM

## 2020-08-13 ENCOUNTER — Other Ambulatory Visit: Payer: Self-pay | Admitting: Family Medicine

## 2020-08-13 ENCOUNTER — Other Ambulatory Visit: Payer: Self-pay | Admitting: Cardiology

## 2020-08-19 ENCOUNTER — Encounter: Payer: Self-pay | Admitting: Physical Medicine & Rehabilitation

## 2020-08-19 ENCOUNTER — Encounter: Payer: Medicare HMO | Attending: Physical Medicine & Rehabilitation | Admitting: Physical Medicine & Rehabilitation

## 2020-08-19 ENCOUNTER — Other Ambulatory Visit: Payer: Self-pay

## 2020-08-19 VITALS — BP 127/87 | HR 88 | Temp 98.2°F | Ht 72.0 in | Wt 224.2 lb

## 2020-08-19 DIAGNOSIS — G8928 Other chronic postprocedural pain: Secondary | ICD-10-CM | POA: Insufficient documentation

## 2020-08-19 DIAGNOSIS — Z79891 Long term (current) use of opiate analgesic: Secondary | ICD-10-CM | POA: Diagnosis not present

## 2020-08-19 DIAGNOSIS — Z5181 Encounter for therapeutic drug level monitoring: Secondary | ICD-10-CM | POA: Diagnosis not present

## 2020-08-19 DIAGNOSIS — G894 Chronic pain syndrome: Secondary | ICD-10-CM | POA: Diagnosis not present

## 2020-08-19 NOTE — Progress Notes (Addendum)
Subjective:    Patient ID: Sean Horn, male    DOB: 1961/07/03, 60 y.o.   MRN: [id number]  HPI CC:  RIght knee and RIght shoulder pain 60 year old male with history of diabetes and hypertension who was referred by his primary care physician for the evaluation of multiple joint pains.  While the patient complains of pain in his neck right knee bilateral shoulders and right wrist, he feels the right shoulder and right knee seem to be the worse pains Has tried multiple creams, uses ice to RIght shoulder RIght TKR with revision, chronic postoperative pain Partial tear RIght rotator cuff, articular- completed PT, HEP ongoing Left shoulder , acromioplasty , rotator cuff repair with revision The patient tried what sounds like genicular nerve blocks of the right knee performed by Dr. Regino Schultze at [facility].  Patient states that the procedure was painful and was not helpful. 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 General activity 8 Relation with others 6 Enjoyment of life 7 What TIME of day is your pain at its worst? evening and night Sleep (in general) Poor  Pain is worse with: standing and some activites Pain improves with: heat/ice, pacing activities and medication Relief from Meds: 5  walk without assistance use a cane ability to climb steps?  yes do you drive?  yes  disabled: date disabled Disable for 5-6 years.  weakness numbness anxiety  Any changes since last visit?  no New Patient  Any changes since last visit?  no New Patient    Family History  Problem Relation Age of Onset  . COPD Mother        smoker  . Hypertension Mother   . Heart disease Mother        CHF  . Diabetes Father   . Heart disease Father         CABG in 19's  . Stroke Father   . Hypertension Father   . Peripheral vascular disease Father   . Hypertension Son        tachycardia  . Heart disease Maternal Grandfather   . Arthritis Paternal Grandmother   . Colon cancer Neg Hx   . Rectal cancer Neg Hx   . Stomach cancer Neg Hx    Social History   Socioeconomic History  . Marital status: Single    Spouse name: Not on file  . Number of children: 2  . Years of education: Not on file  . Highest education level: High school graduate  Occupational History  . Occupation: Disabled  Tobacco Use  . Smoking status: Never Smoker  . Smokeless tobacco: Current User    Types: Snuff  Vaping Use  . Vaping Use: Never used  Substance and Sexual Activity  . Alcohol use: No  . Drug use: No  . Sexual activity: Yes  Other Topics Concern  . Not on file  Social History Narrative   Lives at home with his father (he takes care of his father)   Right handed   Drinks 2 cups of caffeine daily   Social Determinants of Health   Financial Resource Strain: Not on file  Food Insecurity: Not on file  Transportation Needs: Not on file  Physical Activity: Not on  file  Stress: Not on file  Social Connections: Not on file   Past Surgical History:  Procedure Laterality Date  . ANTERIOR CERVICAL DECOMP/DISCECTOMY FUSION N/A 01/16/2018   Procedure: ANTERIOR CERVICAL DECOMPRESSION/DISCECTOMY FUSION CERVICAL FOUR-FIVE, CERVICAL FIVE-SIX;  Surgeon: Lisbeth RenshawNundkumar, Neelesh, MD;  Location: [facility] OR;  Service: Neurosurgery;  Laterality: N/A;  ANTERIOR CERVICAL DECOMPRESSION/DISCECTOMY FUSION CERVICAL FOUR-FIVE, CERVICAL FIVE-SIX  . CHOLECYSTECTOMY  1980's  . CYSTOSCOPY/RETROGRADE/URETEROSCOPY/STONE EXTRACTION WITH BASKET Right 08/27/2003   and double J stent placement  . ELECTROPHYSIOLOGY STUDY N/A 07/23/2019   Procedure: ELECTROPHYSIOLOGY STUDY;  Surgeon: Marinus Mawaylor, Gregg W, MD;  [facility];  Service: Cardiovascular;  Laterality: N/A;  . INGUINAL  HERNIA REPAIR Left 1990's  . KNEE ARTHROSCOPY Left 1980's  . KNEE ARTHROSCOPY Right 2013  . KNEE ARTHROSCOPY Right 06/30/2013   Procedure: RIGHT KNEE ARTHROSCOPY AND SYNOVECTOMY;  Surgeon: Velna OchsPeter G Dalldorf, MD;  Location: [facility];  Service: Orthopedics;  Laterality: Right;  . LEFT HEART CATH AND CORONARY ANGIOGRAPHY N/A 06/26/2019   Procedure: LEFT HEART CATH AND CORONARY ANGIOGRAPHY;  Surgeon: Marykay LexHarding, David W, MD;  [facility];  Service: Cardiovascular;  Laterality: N/A;  . LITHOTRIPSY     multiple, b/l  . SHOULDER ADHESION RELEASE Left ` 1998  . SHOULDER ARTHROSCOPY W/ ROTATOR CUFF REPAIR Left 10/20/2004   x 4, had to have part of clavile removed. torn rotator cuff multiple times, had to place screws   . SHOULDER ARTHROSCOPY W/ SUPERIOR LABRAL ANTERIOR POSTERIOR LESION REPAIR Left 12/22/2004  . SVT ABLATION N/A 07/23/2019   Procedure: SVT ABLATION;  Surgeon: Marinus Mawaylor, Gregg W, MD;  [facility];  Service: Cardiovascular;  Laterality: N/A;  . TOTAL KNEE ARTHROPLASTY Right 12/23/2012   x3, 2 arthroscopies and a TKR,  TOTAL KNEE ARTHROPLASTY;  Surgeon: Velna OchsPeter G Dalldorf, MD;  Location: [facility] OR;  Service: Orthopedics;  Laterality: Right;  DEPUY, RNFA  . WRIST SURGERY Right    x 3, torn ligaments, pins placed then infected, then fused with plate   Past Medical History:  Diagnosis Date  . Anxiety   . CAD in native artery    a. cath 06/26/2019 - 80% apical LAD >> medical management   . Chronic headaches   . Chronic neck pain   . Coronary artery disease involving native coronary artery of native heart without angina pectoris 11/06/2019  . GERD (gastroesophageal reflux disease)    occasional - OTC as needed  . History of chicken pox   . History of kidney stones   . History of shingles   . Hypertension    under control with med., had been on med. since age 60  . Insomnia 09/13/2016  . Insulin dependent diabetes mellitus    Follows with  endocrinology, Dr Chestine Sporelark reports his last hgba1c was 7.3   . Non-insulin dependent type 2 diabetes mellitus (HCC)   . Non-ST elevation (NSTEMI) myocardial infarction (HCC) 06/26/2019  . Obesity 01/30/2017  . Occipital neuralgia   . Osteoarthritis    right knee  . PONV (postoperative nausea and vomiting)   . PONV (postoperative nausea and vomiting)   . Preventative health care 08/14/2013  . 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 SVT (supraventricular tachycardia) (HCC) 06/26/2019  . Tachycardia   . TIA (transient ischemic attack) 11/03/2018  . Type 2 diabetes mellitus with complication, with long-term current use of insulin (HCC)    Follows with endocrinology, Dr Chestine Sporelark reports his last  hgba1c was 7.3   . Urinary frequency 12/11/2016   There were no vitals taken for this visit.  Opioid Risk Score:   Fall Risk Score:  `1  Depression screen PHQ 2/9  Depression screen [facility] 2/9 03/15/2016 08/10/2015  Decreased Interest 0 2  Down, Depressed, Hopeless 0 3  PHQ - 2 Score 0 5  Altered sleeping - 3  Tired, decreased energy - 2  Change in appetite - 3  Feeling bad or failure about yourself  - 3  Trouble concentrating - 1  Moving slowly or fidgety/restless - 3  Suicidal thoughts - 0  PHQ-9 Score - 20  Difficult doing work/chores - Very difficult  Some recent data might be hidden   Review of Systems  Musculoskeletal: Positive for neck pain.       Pain in the Right shoulder, both knees, right wrist. Neck pain  Hematological: Bruises/bleeds easily.  All other systems reviewed and are negative.      Objective:   Physical Exam Vitals and nursing note reviewed.  Constitutional:      Appearance: He is obese.  HENT:     Head: Normocephalic and atraumatic.  Eyes:     Extraocular Movements: Extraocular movements intact.     Conjunctiva/sclera: Conjunctivae normal.     Pupils: Pupils are equal, round, and reactive to light.  Cardiovascular:     Rate and Rhythm: Normal rate and regular  rhythm.     Pulses: Normal pulses.     Heart sounds: Normal heart sounds. No murmur heard.   Pulmonary:     Effort: Pulmonary effort is normal. No respiratory distress.     Breath sounds: Normal breath sounds. No wheezing.  Abdominal:     General: Abdomen is flat. Bowel sounds are normal. There is no distension.     Palpations: Abdomen is soft.  Musculoskeletal:        General: Tenderness present.     Cervical back: No tenderness.     Comments: Positive Hawkins right shoulder Negative drop arm test Decreased range of motion especially with internal rotation of the right shoulder.  Left shoulder has mildly diminished abduction otherwise has good internal and external rotation Right knee without evidence of effusion no joint line tenderness there is mild tenderness at the superior inferior aspect of the patella.  Skin:    General: Skin is warm and dry.  Neurological:     Mental Status: He is alert and oriented to person, place, and time.     Comments: Motor strength is 5/5 bilateral deltoid, bicep, hip flexor, knee extensor, ankle dorsiflexor and plantar flexion Sensation intact light touch and pinprick bilateral C7-C8, L2-L3-L4 dermatomal distribution Negative straight leg raising Spine range of motion is reduced to approximately 0 to 25% of normal flexion extension lateral bending and patient Lumbar spine range of motion is 50% of normal with flexion extension lateral bending and rotation  Psychiatric:        Mood and Affect: Mood normal.        Behavior: Behavior normal.           Assessment & Plan:  1.  Chronic multifactorial pain the patient has bilateral rotator cuff syndrome is undergone multiple surgeries on the left side hoping to avoid surgeries on the right side.  Patient has had corticosteroid injection on the right side without much provement. Continues with physical therapy exercises that he received after his 4 rotator cuff surgeries on the left side. 2.  Right  knee pain  chronic postoperative, has had multiple surgeries most recently right total knee revision, he did not respond well to genicular nerve blocks.  Not a candidate for intra-articular injection given his total knee replacement.  May benefit at least partially from Voltaren gel 3.  Cervical postlaminectomy syndrome Limited range of motion, no cervical radiculopathy symptoms. We discussed his current pain medication usage.  This main issues are at night and takes a Tylenol 3 at night to sleep.  He sometimes needs to use 1 a day as well.  Goal is to be able to get sufficient relief to ensure functioning and ability to form home exercise program ..  We will check urine drug screen first and if consistent we will be able to prescribe Tylenol 3, 45 tablets/month Return to clinic in 3 months  UDS consistent start T#3 45 tab per month

## 2020-08-19 NOTE — Patient Instructions (Addendum)
Go to [facility] for urine test  Results should be available next week If urine test is consistent , will continue T #3 , 1-2 tablet per day and 2 refills.

## 2020-08-27 LAB — TOXASSURE SELECT 13 (MW), URINE

## 2020-08-29 ENCOUNTER — Telehealth: Payer: Self-pay | Admitting: *Deleted

## 2020-08-29 NOTE — Telephone Encounter (Signed)
Urine drug screen for this encounter is consistent for prescribed medication 

## 2020-08-30 ENCOUNTER — Ambulatory Visit: Payer: Medicare HMO | Admitting: Internal Medicine

## 2020-09-10 ENCOUNTER — Other Ambulatory Visit: Payer: Self-pay | Admitting: Family Medicine

## 2020-09-10 DIAGNOSIS — G8929 Other chronic pain: Secondary | ICD-10-CM

## 2020-09-10 DIAGNOSIS — F419 Anxiety disorder, unspecified: Secondary | ICD-10-CM

## 2020-09-12 NOTE — Telephone Encounter (Signed)
Requesting:Tylenol 3 Contract:  10/28/15 UDS: none Last Visit: 04/21/2020 Next Visit: none Last Refill: 07/20/2020  Please Advise

## 2020-09-16 ENCOUNTER — Ambulatory Visit: Payer: Medicare HMO | Admitting: Internal Medicine

## 2020-09-20 ENCOUNTER — Telehealth: Payer: Self-pay

## 2020-09-20 MED ORDER — ISOSORBIDE MONONITRATE ER 30 MG PO TB24: 30.0000 mg | ORAL_TABLET | Freq: Every day | ORAL | 6 refills | Status: DC

## 2020-09-20 NOTE — Telephone Encounter (Signed)
Refill sent to pharmacy.   

## 2020-09-22 DIAGNOSIS — E119 Type 2 diabetes mellitus without complications: Secondary | ICD-10-CM | POA: Diagnosis not present

## 2020-09-22 DIAGNOSIS — H5210 Myopia, unspecified eye: Secondary | ICD-10-CM | POA: Diagnosis not present

## 2020-09-22 DIAGNOSIS — H2513 Age-related nuclear cataract, bilateral: Secondary | ICD-10-CM | POA: Diagnosis not present

## 2020-09-22 DIAGNOSIS — E7800 Pure hypercholesterolemia, unspecified: Secondary | ICD-10-CM | POA: Diagnosis not present

## 2020-09-23 ENCOUNTER — Ambulatory Visit: Payer: Medicare HMO | Admitting: Cardiology

## 2020-10-04 ENCOUNTER — Other Ambulatory Visit: Payer: Self-pay | Admitting: Internal Medicine

## 2020-10-04 ENCOUNTER — Encounter: Payer: Self-pay | Admitting: Family Medicine

## 2020-10-04 ENCOUNTER — Other Ambulatory Visit: Payer: Self-pay | Admitting: Family Medicine

## 2020-10-04 DIAGNOSIS — G8929 Other chronic pain: Secondary | ICD-10-CM

## 2020-10-04 DIAGNOSIS — M2550 Pain in unspecified joint: Secondary | ICD-10-CM

## 2020-10-11 ENCOUNTER — Other Ambulatory Visit: Payer: Self-pay | Admitting: Family Medicine

## 2020-10-11 DIAGNOSIS — F419 Anxiety disorder, unspecified: Secondary | ICD-10-CM

## 2020-10-14 ENCOUNTER — Other Ambulatory Visit: Payer: Self-pay | Admitting: Internal Medicine

## 2020-10-14 ENCOUNTER — Other Ambulatory Visit: Payer: Self-pay | Admitting: Family Medicine

## 2020-10-14 DIAGNOSIS — G8929 Other chronic pain: Secondary | ICD-10-CM

## 2020-10-20 ENCOUNTER — Ambulatory Visit: Payer: Medicare HMO | Admitting: Internal Medicine

## 2020-11-05 NOTE — Progress Notes (Addendum)
[facility] [address] [place], [place] [address] [phone]  Date:  11/14/2020   Name:  Sean Horn   DOB:  03-08-1961   MRN:  [id number]  PCP:  Pearline Cables, MD    Chief Complaint: Medication Refills   History of Present Illness:  Sean Horn is a 60 y.o. very pleasant male patient who presents with the following:  Here today for follow-up and flu shot- however his flu shot is actually UTD Last seen by myself 6 months ago   History of hypertension, NSTEMI/CAD, SVT status post catheter ablation in December 2020 His most recent cardiac cath showed an 80% LAD lesion unsuitable for PCI, aggressive medical management recommended  Last seen by cardiology in October 1. Primary hypertension   2. Coronary artery disease involving native coronary artery of native heart without angina pectoris   3. Type 2 diabetes mellitus with complication, with long-term current use of insulin (HCC)   4. Mixed hyperlipidemia   5. NSVT (nonsustained ventricular tachycardia) (HCC)    PLAN:    Also we discussed the patient monitors with him.  Due to NSVT on his current monitor I am increasing his beta-blocker from 50 mg daily to 12.5 mg in a.m. and 50 mg in nighttime.  In addition I am concerned about his shortness of breath with his history ischemic cardiomyopathy I like to repeat an echocardiogram to make sure that his EF has not dropped and there is no valvular abnormality that is worsened.  In the meantime I was able to connect with our interventional partners to review the patient's catheterization which was done in November 2020 to reassess the LAD.  But unfortunately it remains that this vessel is a small caliber vessel and where of the analysis this is not suitable for revascularization for PCI.  We will continue medical management.  His blood pressure is slightly elevated in the office.  Hopefully with the  increase in beta-blocker will have some improvement.  If not we will adjust this at his next follow-up visit.  Eye exam- he went 4 weeks ago  covid series- he did the 2 series, did not get booster yet -I did encourage him to go ahead and get his booster Tetanus- will do today  Diabetes today, last A1c was elevated  Lab Results  Component Value Date   HGBA1C 9.6 (A) 04/14/2020  His main concern today is pain-various MSK issues He is also seeing Dr Wynn Banker with PM&R- most recent visit in January  Assessment & Plan:  1.  Chronic multifactorial pain the patient has bilateral rotator cuff syndrome is undergone multiple surgeries on the left side hoping to avoid surgeries on the right side.  Patient has had corticosteroid injection on the right side without much provement. Continues with physical therapy exercises that he received after his 4 rotator cuff surgeries on the left side. 2.  Right knee pain chronic postoperative, has had multiple surgeries most recently right total knee revision, he did not respond well to genicular nerve blocks.  Not a candidate for intra-articular injection given his total knee replacement.  May benefit at least partially from Voltaren gel 3.  Cervical postlaminectomy syndrome Limited range of motion, no cervical radiculopathy symptoms. We discussed his current pain medication usage.  This main issues are at night and takes a Tylenol 3 at night to sleep.  He sometimes needs to use 1 a day as well.  Goal is to be able to get sufficient relief to ensure functioning and ability to form home exercise program ..  We will check urine drug screen first and if consistent we will be able to prescribe Tylenol 3, 45 tablets/month Return to clinic in 3 months  UDS consistent start T#3 45 tab per month   I have been prescribing his Tylenol 3, reviewed PMP report He notes that his pain is still bothersome- he is taking some ibuprofen or Aleve as well as his tylenol #3-generally  taking Tylenol 3 twice daily He notes his pain is often not controlled still with this regimen  UDS is UTD, January 2022  Aspirin 81 Plavix 75 Lipitor Lexapro NovoLog FlexPen 3 times daily Levemir Trulicity Isosorbide Prevacid Levothyroxine 25 mcg Lisinopril Metformin 500 twice daily Metoprolol XL Requip Patient Active Problem List   Diagnosis Date Noted  . PONV (postoperative nausea and vomiting)   . Non-insulin dependent type 2 diabetes mellitus (HCC)   . Chronic neck pain   . Chronic headaches   . CAD in native artery   . Coronary artery disease involving native coronary artery of native heart without angina pectoris 11/06/2019  . Diastolic dysfunction 11/06/2019  . SVT (supraventricular tachycardia) (HCC) 06/26/2019  . Non-ST elevation (NSTEMI) myocardial infarction (HCC) 06/26/2019  . Tachycardia 06/25/2019  . Hypertriglyceridemia 06/16/2019  . Palpitations 06/12/2019  . Morning headache 12/02/2018  . 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 Retrognathia 12/02/2018  . TIA (transient ischemic attack) 11/03/2018  . Cervical stenosis of spine 01/16/2018  . Hyperlipidemia 07/19/2017  . Obesity 01/30/2017  . Urinary frequency 12/11/2016  . Insomnia 09/13/2016  . History of chicken pox   . History of shingles   . Preventative health care 08/14/2013  . Anxiety   . History of kidney stones   . GERD (gastroesophageal reflux disease)   . Type 2 diabetes mellitus with complication, with long-term current use of insulin (HCC)   . Hypertension   . Osteoarthritis   . 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 Anxiety   . CAD in native artery    a. cath 06/26/2019 - 80% apical LAD >> medical management   . Chronic headaches   . Chronic neck pain   . Coronary artery disease involving native coronary artery of native heart without angina pectoris 11/06/2019  . GERD (gastroesophageal reflux disease)    occasional -  OTC as needed  . History of chicken pox   . History of kidney stones   . History of shingles   . Hypertension    under control with med., had been on med. since age 89  . Insomnia 09/13/2016  . Insulin dependent diabetes mellitus    Follows with endocrinology, Dr Chestine Spore reports his last hgba1c was 7.3   . Non-insulin dependent type 2 diabetes mellitus (HCC)   . Non-ST elevation (NSTEMI) myocardial infarction (HCC) 06/26/2019  . Obesity 01/30/2017  . Occipital neuralgia   . Osteoarthritis    right knee  . PONV (postoperative nausea and vomiting)   . PONV (postoperative nausea and vomiting)   . Preventative health care 08/14/2013  . 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 SVT (supraventricular tachycardia) (HCC) 06/26/2019  . Tachycardia   . TIA (transient ischemic attack) 11/03/2018  . Type 2 diabetes mellitus with complication, with long-term current use of insulin (HCC)    Follows with endocrinology,  Dr Chestine Spore reports his last hgba1c was 7.3   . Urinary frequency 12/11/2016    Past Surgical History:  Procedure Laterality Date  . ANTERIOR CERVICAL DECOMP/DISCECTOMY FUSION N/A 01/16/2018   Procedure: ANTERIOR CERVICAL DECOMPRESSION/DISCECTOMY FUSION CERVICAL FOUR-FIVE, CERVICAL FIVE-SIX;  Surgeon: Lisbeth Renshaw, MD;  Location: [facility] OR;  Service: Neurosurgery;  Laterality: N/A;  ANTERIOR CERVICAL DECOMPRESSION/DISCECTOMY FUSION CERVICAL FOUR-FIVE, CERVICAL FIVE-SIX  . CHOLECYSTECTOMY  1980's  . CYSTOSCOPY/RETROGRADE/URETEROSCOPY/STONE EXTRACTION WITH BASKET Right 08/27/2003   and double J stent placement  . ELECTROPHYSIOLOGY STUDY N/A 07/23/2019   Procedure: ELECTROPHYSIOLOGY STUDY;  Surgeon: Marinus Maw, MD;  Location: [facility] INVAS[facility] CV LAB;  Service: Cardiovascular;  Laterality: N/A;  . INGUINAL HERNIA REPAIR Left 1990's  . KNEE ARTHROSCOPY Left 1980's  . KNEE ARTHROSCOPY Right 2013  . KNEE ARTHROSCOPY Right 06/30/2013   Procedure: RIGHT KNEE ARTHROSCOPY AND SYNOVECTOMY;   Surgeon: Velna Ochs, MD;  Location:  SURGERY CENTER;  Service: Orthopedics;  Laterality: Right;  . LEFT HEART CATH AND CORONARY ANGIOGRAPHY N/A 06/26/2019   Procedure: LEFT HEART CATH AND CORONARY ANGIOGRAPHY;  Surgeon: Marykay Lex, MD;  Location: [facility] INVASIVE CV LAB;  Service: Cardiovascular;  Laterality: N/A;  . LITHOTRIPSY     multiple, b/l  . SHOULDER ADHESION RELEASE Left ` 1998  . SHOULDER ARTHROSCOPY W/ ROTATOR CUFF REPAIR Left 10/20/2004   x 4, had to have part of clavile removed. torn rotator cuff multiple times, had to place screws   . SHOULDER ARTHROSCOPY W/ SUPERIOR LABRAL ANTERIOR POSTERIOR LESION REPAIR Left 12/22/2004  . SVT ABLATION N/A 07/23/2019   Procedure: SVT ABLATION;  Surgeon: Marinus Maw, MD;  Location: [facility] INVASIVE CV LAB;  Service: Cardiovascular;  Laterality: N/A;  . TOTAL KNEE ARTHROPLASTY Right 12/23/2012   x3, 2 arthroscopies and a TKR,  TOTAL KNEE ARTHROPLASTY;  Surgeon: Velna Ochs, MD;  Location: [facility] OR;  Service: Orthopedics;  Laterality: Right;  DEPUY, RNFA  . WRIST SURGERY Right    x 3, torn ligaments, pins placed then infected, then fused with plate    Social History   Tobacco Use  . Smoking status: Never Smoker  . Smokeless tobacco: Current User    Types: Snuff  Vaping Use  . Vaping Use: Never used  Substance Use Topics  . Alcohol use: No  . Drug use: No    Family History  Problem Relation Age of Onset  . COPD Mother        smoker  . Hypertension Mother   . Heart disease Mother        CHF  . Diabetes Father   . Heart disease Father        CABG in 43's  . Stroke Father   . Hypertension Father   . Peripheral vascular disease Father   . Hypertension Son        tachycardia  . Heart disease Maternal Grandfather   . Arthritis Paternal Grandmother   . Colon cancer Neg Hx   . Rectal cancer Neg Hx   . Stomach cancer Neg Hx     Allergies  Allergen Reactions  . Rosuvastatin Other (See Comments)  . Toradol  [Ketorolac Tromethamine] Itching    Medication list has been reviewed and updated.  Current Outpatient Medications on File Prior to Visit  Medication Sig Dispense Refill  . acetaminophen-codeine (TYLENOL #3) 300-30 MG tablet TAKE 1 TO 2 TABLETS BY MOUTH EVERY 6 HOURS AS NEEDED FOR MODERATE PAIN 30 tablet 0  . aspirin EC  81 MG EC tablet Take 1 tablet (81 mg total) by mouth daily. 90 tablet 3  . atorvastatin (LIPITOR) 40 MG tablet Take 1 tablet by mouth once daily 90 tablet 2  . clopidogrel (PLAVIX) 75 MG tablet Take 1 tablet by mouth once daily with breakfast 90 tablet 0  . Coenzyme Q10 (COQ-10) 75 MG CAPS Take 75 mg by mouth daily.  0  . diclofenac Sodium (VOLTAREN) 1 % GEL Apply 2 g topically Nightly.    . escitalopram (LEXAPRO) 20 MG tablet Take 1 tablet (20 mg total) by mouth daily. 30 tablet 0  . insulin aspart (NOVOLOG FLEXPEN) 100 UNIT/ML FlexPen Inject 25-35 units 3 times daily with meals 30 mL 5  . isosorbide mononitrate (IMDUR) 30 MG 24 hr tablet Take 1 tablet (30 mg total) by mouth daily. 30 tablet 6  . lansoprazole (PREVACID) 30 MG capsule Take 30 mg by mouth every morning.     Marland Kitchen. LEVEMIR 100 UNIT/ML injection INJECT 90 UNITS SUBCUTANEOUSLY AT BEDTIME 80 mL 1  . levothyroxine (EUTHYROX) 25 MCG tablet Take 1 tablet (25 mcg total) by mouth daily before breakfast. 90 tablet 1  . lisinopril (ZESTRIL) 10 MG tablet Take 1 tablet by mouth once daily 90 tablet 0  . metFORMIN (GLUCOPHAGE-XR) 500 MG 24 hr tablet TAKE 2 TABLETS BY MOUTH TWICE DAILY WITH MEALS 360 tablet 3  . metoprolol succinate (TOPROL XL) 25 MG 24 hr tablet Take 0.5 tablets (12.5 mg total) by mouth daily. 45 tablet 3  . metoprolol succinate (TOPROL-XL) 50 MG 24 hr tablet TAKE 1 TABLET BY MOUTH ONCE DAILY. TAKE WITH OR IMMEDIATELY FOLLOWING A MEAL. 90 tablet 3  . nitroGLYCERIN (NITROSTAT) 0.4 MG SL tablet Place 1 tablet (0.4 mg total) under the tongue every 5 (five) minutes as needed. 25 tablet 12  . rOPINIRole (REQUIP) 0.5  MG tablet Take 2 tablets (1 mg total) by mouth at bedtime. 180 tablet 0  . TRULICITY 4.5 MG/0.5ML SOPN INJECT 4.5 MG AS DIRECTED ONCE A WEEK 2 mL 0   No current facility-administered medications on file prior to visit.    Review of Systems:  As per HPI- otherwise negative.  Pulse Readings from Last 3 Encounters:  11/14/20 (!) 104  08/19/20 88  05/09/20 81     Physical Examination: Vitals:   11/14/20 1111  BP: 118/80  Pulse: (!) 104  Resp: 16  Temp: 97.7 F (36.5 C)  SpO2: 93%   Vitals:   11/14/20 1111  Weight: 225 lb (102.1 kg)  Height: 6' (1.829 m)   Body mass index is 30.52 kg/m. Ideal Body Weight: Weight in (lb) to have BMI = 25: 183.9  GEN: no acute distress.  Obese, otherwise looks well HEENT: Atraumatic, Normocephalic.  Ears and Nose: No external deformity. CV: RRR, No M/G/R. No JVD. No thrill. No extra heart sounds. PULM: CTA B, no wheezes, crackles, rhonchi. No retractions. No resp. distress. No accessory muscle use. ABD: S, NT, ND, +BS. No rebound. No HSM. EXTR: No c/c/e PSYCH: Normally interactive. Conversant.    Assessment and Plan: Acquired hypothyroidism - Plan: TSH  Chronic joint pain - Plan: acetaminophen-codeine (TYLENOL #3) 300-30 MG tablet  Elevated bilirubin - Plan: Bilirubin, fractionated(tot/dir/indir)  Type 2 diabetes mellitus with complication, with long-term current use of insulin (HCC) - Plan: Hemoglobin A1c, Basic metabolic panel  Palpitations - Plan: CBC  Essential hypertension - Plan: Basic metabolic panel, CBC  Immunization due - Plan: Tdap vaccine greater than or equal to 7yo IM  Patient today for a follow-up visit.  Labs are pending as above, follow-up on thyroid, diabetes Blood pressures under control Also noted elevated bilirubin in the past, will do a fractionated bilirubin panel We discussed his chronic pain.  With his heart disease and use of aspirin Plavix I would prefer to limit NSAID use.  I advised him that he may  take some additional plain Tylenol along with this Tylenol No. 3.  Also, he may take Tylenol 3 up to 3 times daily when needed for more severe pain Discussed total daily limit of acetaminophen Will plan further follow- up pending labs.  This visit occurred during the SARS-CoV-2 public health emergency.  Safety protocols were in place, including screening questions prior to the visit, additional usage of staff PPE, and extensive cleaning of exam room while observing appropriate contact time as indicated for disinfecting solutions.    Signed Abbe Amsterdam, MD   Received labs as below, 4/12-message to patient  Results for orders placed or performed in visit on 11/14/20  Hemoglobin A1c  Result Value Ref Range   Hgb A1c MFr Bld 9.4 (H) 4.6 - 6.5 %  Basic metabolic panel  Result Value Ref Range   Sodium 133 (L) 135 - 145 mEq/L   Potassium 5.2 No hemolysis seen (H) 3.5 - 5.1 mEq/L   Chloride 97 96 - 112 mEq/L   CO2 28 19 - 32 mEq/L   Glucose, Bld 309 (H) 70 - 99 mg/dL   BUN 18 6 - 23 mg/dL   Creatinine, Ser 1.61 0.40 - 1.50 mg/dL   GFR 09.60 >45.40 mL/min   Calcium 9.7 8.4 - 10.5 mg/dL  CBC  Result Value Ref Range   WBC 6.9 4.0 - 10.5 K/uL   RBC 4.83 4.22 - 5.81 Mil/uL   Platelets 240.0 150.0 - 400.0 K/uL   Hemoglobin 15.3 13.0 - 17.0 g/dL   HCT 98.1 19.1 - 47.8 %   MCV 89.0 78.0 - 100.0 fl   MCHC 35.6 30.0 - 36.0 g/dL   RDW 29.5 62.1 - 30.8 %  Bilirubin, fractionated(tot/dir/indir)  Result Value Ref Range   Total Bilirubin 2.7 (H) 0.2 - 1.2 mg/dL   Bilirubin, Direct 0.4 (H) 0.0 - 0.2 mg/dL   Indirect Bilirubin 2.3 (H) 0.2 - 1.2 mg/dL (calc)  TSH  Result Value Ref Range   TSH 2.18 0.35 - 4.50 uIU/mL

## 2020-11-14 ENCOUNTER — Ambulatory Visit (INDEPENDENT_AMBULATORY_CARE_PROVIDER_SITE_OTHER): Payer: Medicare HMO | Admitting: Family Medicine

## 2020-11-14 ENCOUNTER — Encounter: Payer: Self-pay | Admitting: Family Medicine

## 2020-11-14 ENCOUNTER — Other Ambulatory Visit: Payer: Self-pay

## 2020-11-14 VITALS — BP 118/80 | HR 104 | Temp 97.7°F | Resp 16 | Ht 72.0 in | Wt 225.0 lb

## 2020-11-14 DIAGNOSIS — Z794 Long term (current) use of insulin: Secondary | ICD-10-CM | POA: Diagnosis not present

## 2020-11-14 DIAGNOSIS — E039 Hypothyroidism, unspecified: Secondary | ICD-10-CM | POA: Diagnosis not present

## 2020-11-14 DIAGNOSIS — G8929 Other chronic pain: Secondary | ICD-10-CM | POA: Diagnosis not present

## 2020-11-14 DIAGNOSIS — Z23 Encounter for immunization: Secondary | ICD-10-CM

## 2020-11-14 DIAGNOSIS — R17 Unspecified jaundice: Secondary | ICD-10-CM | POA: Diagnosis not present

## 2020-11-14 DIAGNOSIS — M2550 Pain in unspecified joint: Secondary | ICD-10-CM | POA: Diagnosis not present

## 2020-11-14 DIAGNOSIS — E118 Type 2 diabetes mellitus with unspecified complications: Secondary | ICD-10-CM

## 2020-11-14 DIAGNOSIS — I10 Essential (primary) hypertension: Secondary | ICD-10-CM | POA: Diagnosis not present

## 2020-11-14 DIAGNOSIS — R002 Palpitations: Secondary | ICD-10-CM | POA: Diagnosis not present

## 2020-11-14 LAB — CBC
HCT: 42.9 % (ref 39.0–52.0)
Hemoglobin: 15.3 g/dL (ref 13.0–17.0)
MCHC: 35.6 g/dL (ref 30.0–36.0)
MCV: 89 fl (ref 78.0–100.0)
Platelets: 240 10*3/uL (ref 150.0–400.0)
RBC: 4.83 Mil/uL (ref 4.22–5.81)
RDW: 13.3 % (ref 11.5–15.5)
WBC: 6.9 10*3/uL (ref 4.0–10.5)

## 2020-11-14 LAB — BILIRUBIN, FRACTIONATED(TOT/DIR/INDIR)
Bilirubin, Direct: 0.4 mg/dL — ABNORMAL HIGH (ref 0.0–0.2)
Indirect Bilirubin: 2.3 mg/dL (calc) — ABNORMAL HIGH (ref 0.2–1.2)
Total Bilirubin: 2.7 mg/dL — ABNORMAL HIGH (ref 0.2–1.2)

## 2020-11-14 LAB — BASIC METABOLIC PANEL
BUN: 18 mg/dL (ref 6–23)
CO2: 28 mEq/L (ref 19–32)
Calcium: 9.7 mg/dL (ref 8.4–10.5)
Chloride: 97 mEq/L (ref 96–112)
Creatinine, Ser: 0.85 mg/dL (ref 0.40–1.50)
GFR: 95.22 mL/min (ref >60.00)
Glucose, Bld: 309 mg/dL — ABNORMAL HIGH (ref 70–99)
Potassium: 5.2 mEq/L — ABNORMAL HIGH (ref 3.5–5.1)
Sodium: 133 mEq/L — ABNORMAL LOW (ref 135–145)

## 2020-11-14 LAB — HEMOGLOBIN A1C: Hgb A1c MFr Bld: 9.4 % — ABNORMAL HIGH (ref 4.6–6.5)

## 2020-11-14 LAB — TSH: TSH: 2.18 u[IU]/mL (ref 0.35–4.50)

## 2020-11-14 MED ORDER — ACETAMINOPHEN-CODEINE #3 300-30 MG PO TABS: 1.0000 | ORAL_TABLET | Freq: Three times a day (TID) | ORAL | 0 refills | Status: DC | PRN

## 2020-11-14 NOTE — Patient Instructions (Addendum)
Good to see you again today!  I will be in touch with your labs Tetanus booster given today  You can take tylenol#3 up to three times a day as needed for pain.  You can also try taking a regular tylenol tablet as needed- just remember limit 4000 mg tylenol total per day

## 2020-11-15 ENCOUNTER — Encounter: Payer: Self-pay | Admitting: Family Medicine

## 2020-11-17 ENCOUNTER — Encounter: Payer: Medicare HMO | Admitting: Registered Nurse

## 2020-11-21 ENCOUNTER — Encounter: Payer: Medicare HMO | Admitting: Registered Nurse

## 2020-11-22 ENCOUNTER — Encounter: Payer: Medicare HMO | Attending: Registered Nurse | Admitting: Registered Nurse

## 2020-12-06 ENCOUNTER — Encounter: Payer: Self-pay | Admitting: Family Medicine

## 2020-12-06 DIAGNOSIS — Q845 Enlarged and hypertrophic nails: Secondary | ICD-10-CM

## 2020-12-07 ENCOUNTER — Other Ambulatory Visit: Payer: Self-pay | Admitting: Family Medicine

## 2020-12-07 ENCOUNTER — Other Ambulatory Visit: Payer: Self-pay | Admitting: Cardiology

## 2020-12-07 ENCOUNTER — Other Ambulatory Visit: Payer: Self-pay | Admitting: Internal Medicine

## 2020-12-07 DIAGNOSIS — G8929 Other chronic pain: Secondary | ICD-10-CM

## 2020-12-07 DIAGNOSIS — M2550 Pain in unspecified joint: Secondary | ICD-10-CM

## 2020-12-08 MED ORDER — ACETAMINOPHEN-CODEINE #3 300-30 MG PO TABS: ORAL_TABLET | ORAL | 2 refills | Status: DC

## 2020-12-08 NOTE — Addendum Note (Signed)
Addended by: Abbe Amsterdam C on: 12/08/2020 02:53 PM   Modules accepted: Orders

## 2020-12-08 NOTE — Addendum Note (Signed)
Addended by: Thelma Barge D on: 12/08/2020 02:14 PM   Modules accepted: Orders

## 2020-12-08 NOTE — Telephone Encounter (Signed)
Requesting: Tylenol #3 Last Visit: 11/14/20 Next Visit: n/a  Last Refill: 11/14/20 (I accidentally printed it so current date say 5/5)  Please Advise

## 2020-12-08 NOTE — Telephone Encounter (Signed)
Plavix 75 mg # 30 tablets only, patient needs appointment for further refills

## 2020-12-22 ENCOUNTER — Encounter: Payer: Self-pay | Admitting: Emergency Medicine

## 2020-12-22 ENCOUNTER — Other Ambulatory Visit: Payer: Self-pay

## 2020-12-22 ENCOUNTER — Ambulatory Visit
Admission: EM | Admit: 2020-12-22 | Discharge: 2020-12-22 | Disposition: A | Discharge status: Home / Self Care | Payer: Medicare HMO | Attending: Emergency Medicine | Admitting: Emergency Medicine

## 2020-12-22 DIAGNOSIS — M25552 Pain in left hip: Secondary | ICD-10-CM

## 2020-12-22 DIAGNOSIS — M25551 Pain in right hip: Secondary | ICD-10-CM

## 2020-12-22 MED ORDER — PREDNISONE 20 MG PO TABS: 20.0000 mg | ORAL_TABLET | Freq: Two times a day (BID) | ORAL | 0 refills | Status: AC

## 2020-12-22 NOTE — ED Provider Notes (Signed)
[facility] [address] CARE CENTER   [id number] 12/22/20 Arrival Time: 2585  ID:[id number] hip PAIN  SUBJECTIVE: History from: patient. Sean Horn is a 60 y.o. male complains of bilateral hip pain x 2 weeks.  Denies a precipitating event or specific injury.  Localizes the pain to the outside of bilateral hips.  Describes the pain as constant and achy in character.  Has tried OTC medications without relief.  Currently on tylenol 3.  Symptoms are made worse with Xx.  Denies similar symptoms in the past.  Denies fever, chills, erythema, ecchymosis, effusion, weakness, numbness and tingling, saddle paresthesias, loss of bowel or bladder function.      ROS: As per HPI.  All other pertinent ROS negative.     Past Medical History:  Diagnosis Date  . Anxiety   . CAD in native artery    a. cath 06/26/2019 - 80% apical LAD >> medical management   . Chronic headaches   . Chronic neck pain   . Coronary artery disease involving native coronary artery of native heart without angina pectoris 11/06/2019  . GERD (gastroesophageal reflux disease)    occasional - OTC as needed  . History of chicken pox   . History of kidney stones   . History of shingles   . Hypertension    under control with med., had been on med. since age 50  . Insomnia 09/13/2016  . Insulin dependent diabetes mellitus    Follows with endocrinology, Dr Chestine Spore reports his last hgba1c was 7.3   . Non-insulin dependent type 2 diabetes mellitus (HCC)   . Non-ST elevation (NSTEMI) myocardial infarction (HCC) 06/26/2019  . Obesity 01/30/2017  . Occipital neuralgia   . Osteoarthritis    right knee  . PONV (postoperative nausea and vomiting)   . PONV (postoperative nausea and vomiting)   . Preventative health care 08/14/2013  . 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 SVT (supraventricular tachycardia) (HCC) 06/26/2019  . Tachycardia   . TIA (transient ischemic attack) 11/03/2018  . Type 2 diabetes mellitus with complication, with long-term  current use of insulin (HCC)    Follows with endocrinology, Dr Chestine Spore reports his last hgba1c was 7.3   . Urinary frequency 12/11/2016   Past Surgical History:  Procedure Laterality Date  . ANTERIOR CERVICAL DECOMP/DISCECTOMY FUSION N/A 01/16/2018   Procedure: ANTERIOR CERVICAL DECOMPRESSION/DISCECTOMY FUSION CERVICAL FOUR-FIVE, CERVICAL FIVE-SIX;  Surgeon: Lisbeth Renshaw, MD;  Location: [facility] OR;  Service: Neurosurgery;  Laterality: N/A;  ANTERIOR CERVICAL DECOMPRESSION/DISCECTOMY FUSION CERVICAL FOUR-FIVE, CERVICAL FIVE-SIX  . CHOLECYSTECTOMY  1980's  . CYSTOSCOPY/RETROGRADE/URETEROSCOPY/STONE EXTRACTION WITH BASKET Right 08/27/2003   and double J stent placement  . ELECTROPHYSIOLOGY STUDY N/A 07/23/2019   Procedure: ELECTROPHYSIOLOGY STUDY;  Surgeon: Marinus Maw, MD;  Location: [facility] INVASIVE CV LAB;  Service: Cardiovascular;  Laterality: N/A;  . INGUINAL HERNIA REPAIR Left 1990's  . KNEE ARTHROSCOPY Left 1980's  . KNEE ARTHROSCOPY Right 2013  . KNEE ARTHROSCOPY Right 06/30/2013   Procedure: RIGHT KNEE ARTHROSCOPY AND SYNOVECTOMY;  Surgeon: Velna Ochs, MD;  Location: [facility];  Service: Orthopedics;  Laterality: Right;  . LEFT HEART CATH AND CORONARY ANGIOGRAPHY N/A 06/26/2019   Procedure: LEFT HEART CATH AND CORONARY ANGIOGRAPHY;  Surgeon: Marykay Lex, MD;  Location: [facility] INVASIVE CV LAB;  Service: Cardiovascular;  Laterality: N/A;  . LITHOTRIPSY     multiple, b/l  . SHOULDER ADHESION RELEASE Left ` 1998  . SHOULDER ARTHROSCOPY W/ ROTATOR CUFF REPAIR Left 10/20/2004  x 4, had to have part of clavile removed. torn rotator cuff multiple times, had to place screws   . SHOULDER ARTHROSCOPY W/ SUPERIOR LABRAL ANTERIOR POSTERIOR LESION REPAIR Left 12/22/2004  . SVT ABLATION N/A 07/23/2019   Procedure: SVT ABLATION;  Surgeon: Marinus Maw, MD;  Location: [facility] INVASIVE CV LAB;  Service: Cardiovascular;  Laterality: N/A;  . TOTAL KNEE ARTHROPLASTY Right 12/23/2012   x3,  2 arthroscopies and a TKR,  TOTAL KNEE ARTHROPLASTY;  Surgeon: Velna Ochs, MD;  Location: [facility] OR;  Service: Orthopedics;  Laterality: Right;  DEPUY, RNFA  . WRIST SURGERY Right    x 3, torn ligaments, pins placed then infected, then fused with plate   Allergies  Allergen Reactions  . Rosuvastatin Other (See Comments)  . Toradol [Ketorolac Tromethamine] Itching   No current facility-administered medications on file prior to encounter.   Current Outpatient Medications on File Prior to Encounter  Medication Sig Dispense Refill  . acetaminophen-codeine (TYLENOL #3) 300-30 MG tablet TAKE 1 TO 2 TABLETS BY MOUTH EVERY 6 HOURS AS NEEDED FOR MODERATE PAIN 30 tablet 2  . aspirin EC 81 MG EC tablet Take 1 tablet (81 mg total) by mouth daily. 90 tablet 3  . atorvastatin (LIPITOR) 40 MG tablet Take 1 tablet by mouth once daily 90 tablet 2  . clopidogrel (PLAVIX) 75 MG tablet Take 1 tablet (75 mg total) by mouth daily with breakfast. NEEDS APPOINTMENT FOR FURTHER REFILLS 30 tablet 0  . Coenzyme Q10 (COQ-10) 75 MG CAPS Take 75 mg by mouth daily.  0  . diclofenac Sodium (VOLTAREN) 1 % GEL Apply 2 g topically Nightly.    . escitalopram (LEXAPRO) 20 MG tablet Take 1 tablet (20 mg total) by mouth daily. 30 tablet 0  . insulin aspart (NOVOLOG FLEXPEN) 100 UNIT/ML FlexPen Inject 25-35 units 3 times daily with meals 30 mL 5  . isosorbide mononitrate (IMDUR) 30 MG 24 hr tablet Take 1 tablet (30 mg total) by mouth daily. 30 tablet 6  . lansoprazole (PREVACID) 30 MG capsule Take 30 mg by mouth every morning.     Marland Kitchen LEVEMIR 100 UNIT/ML injection INJECT 90 UNITS SUBCUTANEOUSLY AT BEDTIME 80 mL 1  . levothyroxine (EUTHYROX) 25 MCG tablet Take 1 tablet (25 mcg total) by mouth daily before breakfast. 90 tablet 1  . lisinopril (ZESTRIL) 10 MG tablet Take 1 tablet by mouth once daily 90 tablet 1  . metFORMIN (GLUCOPHAGE-XR) 500 MG 24 hr tablet TAKE 2 TABLETS BY MOUTH TWICE DAILY WITH MEALS 360 tablet 3  . metoprolol  succinate (TOPROL XL) 25 MG 24 hr tablet Take 0.5 tablets (12.5 mg total) by mouth daily. 45 tablet 3  . metoprolol succinate (TOPROL-XL) 50 MG 24 hr tablet TAKE 1 TABLET BY MOUTH ONCE DAILY. TAKE WITH OR IMMEDIATELY FOLLOWING A MEAL. 90 tablet 3  . nitroGLYCERIN (NITROSTAT) 0.4 MG SL tablet Place 1 tablet (0.4 mg total) under the tongue every 5 (five) minutes as needed. 25 tablet 12  . rOPINIRole (REQUIP) 0.5 MG tablet Take 2 tablets (1 mg total) by mouth at bedtime. 180 tablet 0  . TRULICITY 4.5 MG/0.5ML SOPN INJECT 4.5 MG AS DIRECTED ONCE A WEEK 2 mL 0   Social History   Socioeconomic History  . Marital status: Single    Spouse name: Not on file  . Number of children: 2  . Years of education: Not on file  . Highest education level: High school graduate  Occupational History  . Occupation: Disabled  Tobacco Use  . Smoking status: Never Smoker  . Smokeless tobacco: Current User    Types: Snuff  Vaping Use  . Vaping Use: Never used  Substance and Sexual Activity  . Alcohol use: No  . Drug use: No  . Sexual activity: Yes  Other Topics Concern  . Not on file  Social History Narrative   Lives at home with his father (he takes care of his father)   Right handed   Drinks 2 cups of caffeine daily   Social Determinants of Health   Financial Resource Strain: Not on file  Food Insecurity: Not on file  Transportation Needs: Not on file  Physical Activity: Not on file  Stress: Not on file  Social Connections: Not on file  Intimate Partner Violence: Not on file   Family History  Problem Relation Age of Onset  . COPD Mother        smoker  . Hypertension Mother   . Heart disease Mother        CHF  . Diabetes Father   . Heart disease Father        CABG in 30's  . Stroke Father   . Hypertension Father   . Peripheral vascular disease Father   . Hypertension Son        tachycardia  . Heart disease Maternal Grandfather   . Arthritis Paternal Grandmother   . Colon cancer Neg  Hx   . Rectal cancer Neg Hx   . Stomach cancer Neg Hx     OBJECTIVE:  Vitals:   12/22/20 0929  BP: (!) 156/97  Pulse: 87  Resp: 18  Temp: 97.9 F (36.6 C)  TempSrc: Temporal  SpO2: 96%    General appearance: ALERT; in no acute distress.  Head: NCAT Lungs: Normal respiratory effort Musculoskeletal: Bilateral hip Inspection: Skin warm, dry, clear and intact without obvious erythema, effusion, or ecchymosis.  Palpation: diffusely TTP over lateral hips ROM: FROM active and passive; discomfort with IR about bil hips Strength: 5/5 hip flexion, 5/5 hip extension Skin: warm and dry Neurologic: Ambulates without difficulty; Sensation intact about the lower extremities Psychological: alert and cooperative; normal mood and affect   ASSESSMENT & PLAN:  1. Bilateral hip pain     Meds ordered this encounter  Medications  . predniSONE (DELTASONE) 20 MG tablet    Sig: Take 1 tablet (20 mg total) by mouth 2 (two) times daily with a meal for 5 days.    Dispense:  10 tablet    Refill:  0    Order Specific Question:   Supervising Provider    Answer:   Eustace Moore [[id number]]   Symptoms most likely arthritis vs. Trochanteric bursitis.  We will trial a course of steroids Continue conservative management of rest, ice, and gentle stretches Follow up with orthopedist for further evaluation and mangement Return or go to the ER if you have any new or worsening symptoms (fever, chills, chest pain, abdominal pain, changes in bowel or bladder habits, pain radiating into lower legs, etc...)   Reviewed expectations re: course of current medical issues. Questions answered. Outlined signs and symptoms indicating need for more acute intervention. Patient verbalized understanding. After Visit Summary given.    Rennis Harding, PA-C 12/22/20 1021

## 2020-12-22 NOTE — Discharge Instructions (Signed)
Symptoms most likely arthritis vs. Trochanteric bursitis.  We will trial a course of steroids Continue conservative management of rest, ice, and gentle stretches Follow up with orthopedist for further evaluation and mangement Return or go to the ER if you have any new or worsening symptoms (fever, chills, chest pain, abdominal pain, changes in bowel or bladder habits, pain radiating into lower legs, etc...)

## 2020-12-22 NOTE — ED Triage Notes (Signed)
Bilateral hip pain x 2 weeks.  Has been treating with tylenol and states pain is not getting any better.

## 2021-01-09 DIAGNOSIS — B351 Tinea unguium: Secondary | ICD-10-CM | POA: Diagnosis not present

## 2021-01-09 DIAGNOSIS — E1140 Type 2 diabetes mellitus with diabetic neuropathy, unspecified: Secondary | ICD-10-CM | POA: Diagnosis not present

## 2021-01-18 ENCOUNTER — Encounter: Payer: Self-pay | Admitting: Family Medicine

## 2021-01-23 ENCOUNTER — Other Ambulatory Visit: Payer: Self-pay | Admitting: Family Medicine

## 2021-01-23 ENCOUNTER — Other Ambulatory Visit: Payer: Self-pay | Admitting: Cardiology

## 2021-01-23 DIAGNOSIS — F419 Anxiety disorder, unspecified: Secondary | ICD-10-CM

## 2021-01-24 NOTE — Telephone Encounter (Signed)
Refill sent to pharmacy.   

## 2021-01-26 ENCOUNTER — Ambulatory Visit: Payer: Medicare HMO | Admitting: Orthopedic Surgery

## 2021-01-26 ENCOUNTER — Other Ambulatory Visit: Payer: Self-pay

## 2021-01-26 ENCOUNTER — Encounter: Payer: Self-pay | Admitting: Orthopedic Surgery

## 2021-01-26 ENCOUNTER — Ambulatory Visit: Payer: Medicare HMO

## 2021-01-26 VITALS — BP 130/87 | HR 78 | Ht 72.0 in | Wt 220.0 lb

## 2021-01-26 DIAGNOSIS — M25851 Other specified joint disorders, right hip: Secondary | ICD-10-CM | POA: Diagnosis not present

## 2021-01-26 DIAGNOSIS — M5410 Radiculopathy, site unspecified: Secondary | ICD-10-CM | POA: Diagnosis not present

## 2021-01-26 DIAGNOSIS — M25852 Other specified joint disorders, left hip: Secondary | ICD-10-CM

## 2021-01-26 DIAGNOSIS — M25859 Other specified joint disorders, unspecified hip: Secondary | ICD-10-CM

## 2021-01-26 DIAGNOSIS — M25551 Pain in right hip: Secondary | ICD-10-CM

## 2021-01-26 DIAGNOSIS — M25552 Pain in left hip: Secondary | ICD-10-CM

## 2021-01-26 MED ORDER — GABAPENTIN 300 MG PO CAPS: 300.0000 mg | ORAL_CAPSULE | Freq: Three times a day (TID) | ORAL | 5 refills | Status: DC

## 2021-01-26 NOTE — Progress Notes (Signed)
NEW PROBLEM//OFFICE VISIT  Summary assessment and plan:   Encounter Diagnoses  Name Primary?   Radicular pain of both lower extremities Yes   Hip pain, bilateral    Femoral acetabular impingement    REC INCREASE GABAPENTIN TO TID   AND PT FOR HIP AND BACK   FU 6 WEEKS   Chief Complaint  Patient presents with   Hip Pain    States bilateral hips painful down legs x3week s   59 YO MALE S/P RT TKA, DM, HTN, CAD, TIA NOW ON PLAVIX PRESNTS WITH 3 WEEKS OF BILATERAL "HIP" PAIN   LEFT SIDE : LATERAL THIGH INTO THE GROIN   RIGHT SIDE LATERAL THIGH TO CALF   PAIN FUL TO WALK   THE RIGHT KNEE AFTER TKA STILL HURTING     ROS  (+) PAIN LEGS AFTER WALKING NECK PAIN  JOINT PAIN  HEART BURN EASY BRUISE ON PLAVIX   MEDICAL DECISION MAKING  A.  Encounter Diagnoses  Name Primary?   Radicular pain of both lower extremities Yes   Hip pain, bilateral    Femoral acetabular impingement     B. DATA ANALYSED:   IMAGING: Interpretation of images: INTERNAL HIPS XRAYS FAI CAM  SCOLIOSIS AND DDD LUMBAR   Orders: PT  Outside records reviewed: NO   C. MANAGEMENT   PT AND MEDICATION RX   Meds ordered this encounter  Medications   gabapentin (NEURONTIN) 300 MG capsule    Sig: Take 1 capsule (300 mg total) by mouth 3 (three) times daily.    Dispense:  90 capsule    Refill:  5      BP 130/87   Pulse 78   Ht 6' (1.829 m)   Wt 220 lb (99.8 kg)   BMI 29.84 kg/m    General appearance: Well-developed well-nourished no gross deformities  Cardiovascular normal pulse and perfusion normal color without edema  Neurologically no sensation loss or deficits or pathologic reflexes  Psychological: Awake alert and oriented x3 mood and affect normal  Skin no lacerations or ulcerations no nodularity no palpable masses, no erythema or nodularity  Musculoskeletal:   RIGHT HIP -PAIN AT 90 FLEXION, PAIN W/ FLEX IR   LEFT HIP  -NORMAL ROM, NO PAIN W/ FLX IR   LEG LENGTHS  +  BACK IS NON TENDER   RT KNEE: INCISION LOOKS NORMAL, NO SWELLING, NO TENDERNESS       Past Medical History:  Diagnosis Date   Anxiety    CAD in native artery    a. cath 06/26/2019 - 80% apical LAD >> medical management    Chronic headaches    Chronic neck pain    Coronary artery disease involving native coronary artery of native heart without angina pectoris 11/06/2019   GERD (gastroesophageal reflux disease)    occasional - OTC as needed   History of chicken pox    History of kidney stones    History of shingles    Hypertension    under control with med., had been on med. 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   And left now    SVT (supraventricular tachycardia) (HCC) 06/26/2019   Tachycardia    TIA (transient ischemic attack) 11/03/2018   Type 2 diabetes mellitus with complication, with long-term current use of insulin (HCC)    Follows with endocrinology, Dr Chestine Spore reports his last hgba1c was 7.3    Urinary frequency 12/11/2016    Past Surgical History:  Procedure Laterality Date   ANTERIOR CERVICAL DECOMP/DISCECTOMY FUSION N/A 01/16/2018   Procedure: ANTERIOR CERVICAL DECOMPRESSION/DISCECTOMY FUSION CERVICAL FOUR-FIVE, CERVICAL FIVE-SIX;  Surgeon: Lisbeth Renshaw, MD;  Location: [facility] OR;  Service: Neurosurgery;  Laterality: N/A;  ANTERIOR CERVICAL DECOMPRESSION/DISCECTOMY FUSION CERVICAL FOUR-FIVE, CERVICAL FIVE-SIX   CHOLECYSTECTOMY  1980's   CYSTOSCOPY/RETROGRADE/URETEROSCOPY/STONE EXTRACTION WITH BASKET Right  08/27/2003   and double J stent placement   ELECTROPHYSIOLOGY STUDY N/A 07/23/2019   Procedure: ELECTROPHYSIOLOGY STUDY;  Surgeon: Marinus Maw, MD;  Location: [facility] INVASIVE CV LAB;  Service: Cardiovascular;  Laterality: N/A;   INGUINAL HERNIA REPAIR Left 1990's   KNEE ARTHROSCOPY Left 1980's   KNEE ARTHROSCOPY Right 2013   KNEE ARTHROSCOPY Right 06/30/2013   Procedure: RIGHT KNEE ARTHROSCOPY AND SYNOVECTOMY;  Surgeon: Velna Ochs, MD;  Location: [facility];  Service: Orthopedics;  Laterality: Right;   LEFT HEART CATH AND CORONARY ANGIOGRAPHY N/A 06/26/2019   Procedure: LEFT HEART CATH AND CORONARY ANGIOGRAPHY;  Surgeon: Marykay Lex, MD;  Location: [facility] INVASIVE CV LAB;  Service: Cardiovascular;  Laterality: N/A;   LITHOTRIPSY     multiple, b/l   SHOULDER ADHESION RELEASE Left ` 1998   SHOULDER ARTHROSCOPY W/ ROTATOR CUFF REPAIR Left 10/20/2004   x 4, had to have part of clavile removed. torn rotator cuff multiple times, had to place screws    SHOULDER ARTHROSCOPY W/ SUPERIOR LABRAL ANTERIOR POSTERIOR LESION REPAIR Left 12/22/2004   SVT ABLATION N/A 07/23/2019   Procedure: SVT ABLATION;  Surgeon: Marinus Maw, MD;  Location: [facility] INVASIVE CV LAB;  Service: Cardiovascular;  Laterality: N/A;   TOTAL KNEE ARTHROPLASTY Right 12/23/2012   x3, 2 arthroscopies and a TKR,  TOTAL KNEE ARTHROPLASTY;  Surgeon: Velna Ochs, MD;  Location: [facility] OR;  Service: Orthopedics;  Laterality: Right;  DEPUY, RNFA   WRIST SURGERY Right    x 3, torn ligaments, pins placed then infected, then fused with plate    Family History  Problem Relation Age of Onset   COPD Mother        smoker   Hypertension Mother    Heart disease Mother        CHF   Diabetes Father    Heart disease Father        CABG in 42's   Stroke Father    Hypertension Father    Peripheral vascular disease Father    Hypertension Son        tachycardia   Heart disease Maternal Grandfather    Arthritis Paternal  Grandmother    Colon cancer Neg Hx    Rectal cancer Neg Hx    Stomach cancer Neg Hx    Social History   Tobacco Use   Smoking status: Never   Smokeless tobacco: Current    Types: Snuff  Vaping Use   Vaping Use: Never used  Substance Use Topics   Alcohol use: No   Drug use: No    Allergies  Allergen Reactions   Rosuvastatin Other (See Comments)   Toradol [Ketorolac Tromethamine] Itching  Current Meds  Medication Sig   gabapentin (NEURONTIN) 300 MG capsule Take 1 capsule (300 mg total) by mouth 3 (three) times daily.        Fuller Canada, MD  01/26/2021 10:44 AM

## 2021-01-26 NOTE — Patient Instructions (Signed)
Meds ordered this encounter  Medications   gabapentin (NEURONTIN) 300 MG capsule    Sig: Take 1 capsule (300 mg total) by mouth 3 (three) times daily.    Dispense:  90 capsule    Refill:  5    START PT

## 2021-01-30 IMAGING — MR MR SHOULDER*R* W/O CM
4 of 5 series · 19 of 40 positions shown · non-contrast
Comparison: None.

CLINICAL DATA: Right shoulder pain for 7 months.  No known injury.

EXAM:
MRI OF THE RIGHT SHOULDER WITHOUT CONTRAST
TECHNIQUE: Multiplanar, multisequence MR imaging of the shoulder was performed.
No intravenous contrast was administered.

[Series 3: PD fat-sat · axial · 4.0mm · 0.27mm/px · z∈[-46,+45]mm · 8 of 20 slices shown (1 of 2)]
[im 1/20]
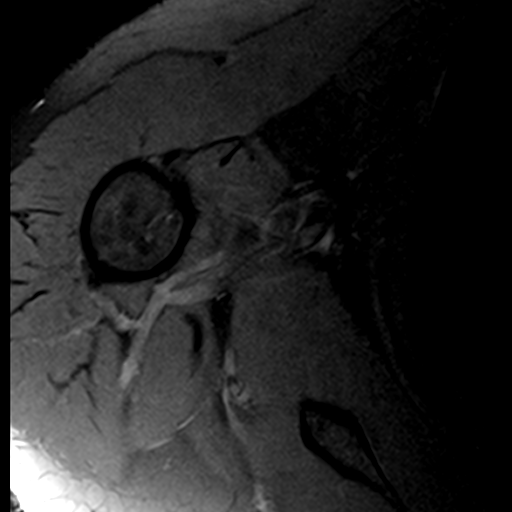
[im 3/20]
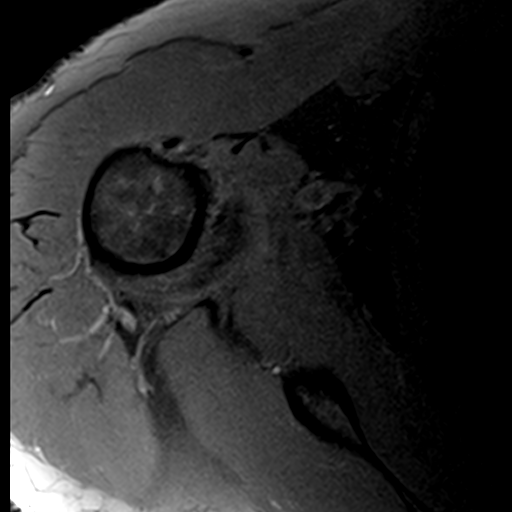
[im 6/20]
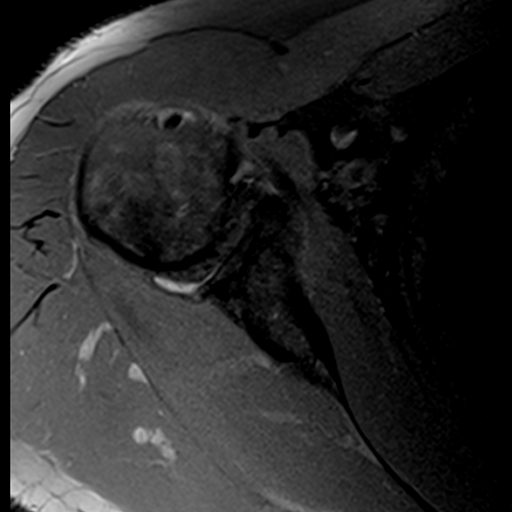
[im 9/20]
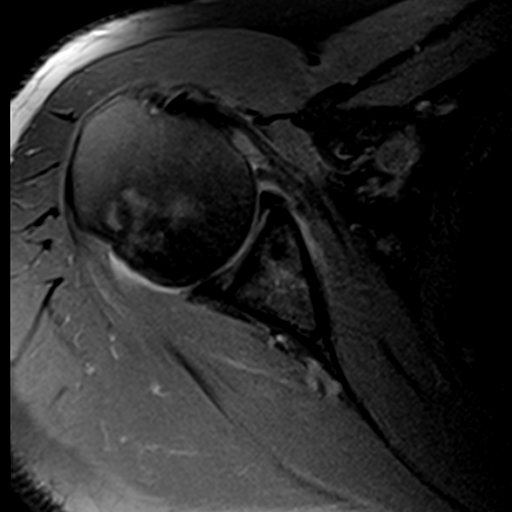
[im 11/20]
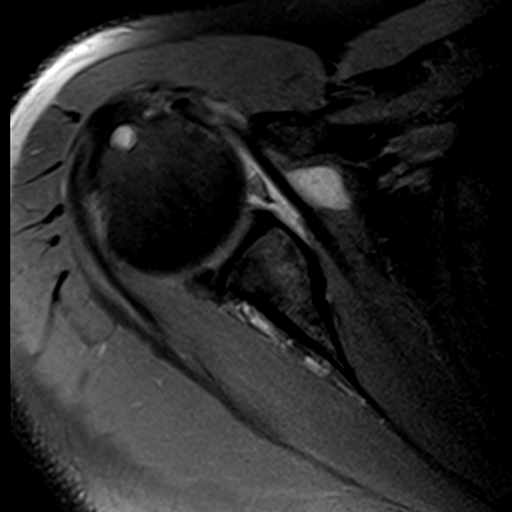
[im 14/20]
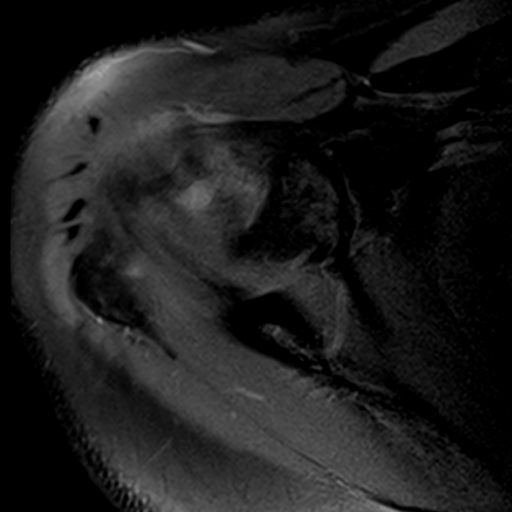
[im 17/20]
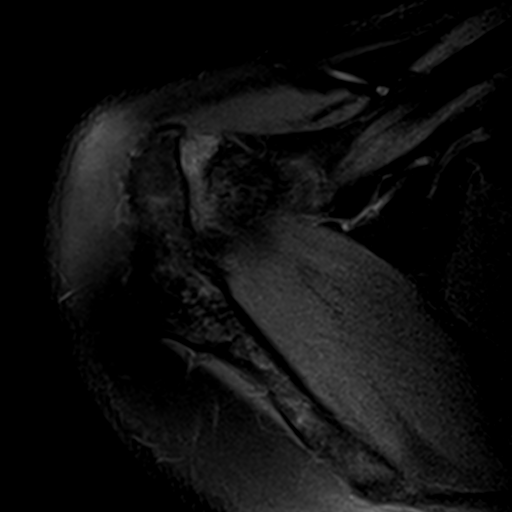
[im 20/20]
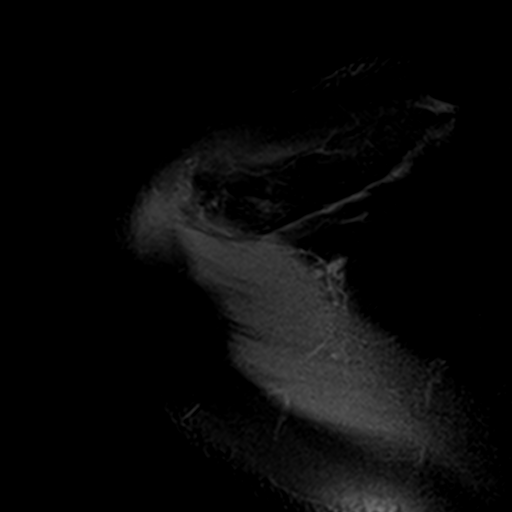

[Series 4: T2 fat-sat · oblique · 4.0mm · 0.28mm/px · 3 of 20 slices shown (1 of 2)]
[im 4/20]
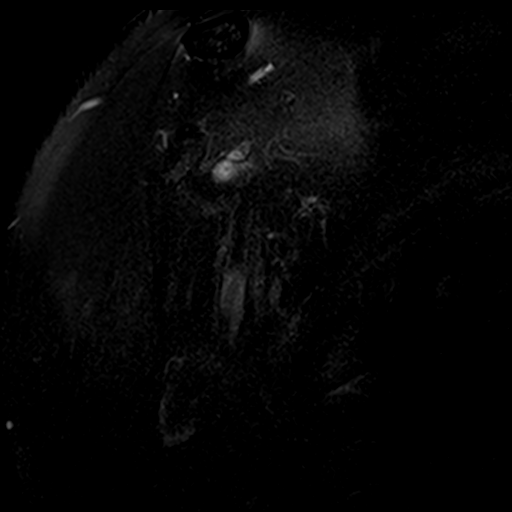
[im 10/20]
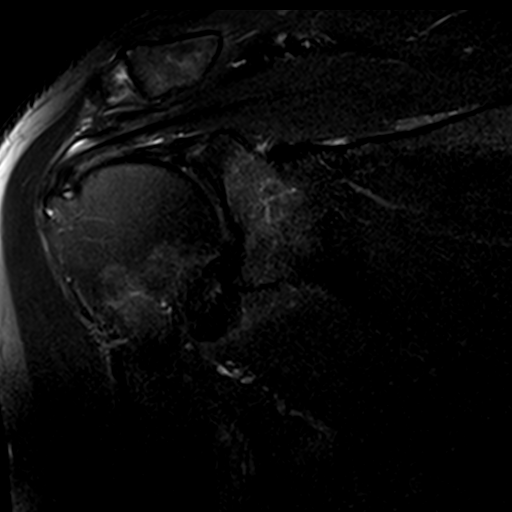
[im 16/20]
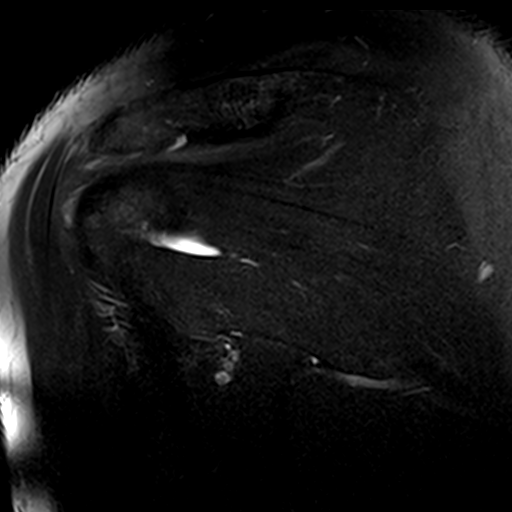

[Series 5: PD fat-sat · oblique · 4.0mm · 0.28mm/px · 5 of 20 slices shown (2 of 2)]
[im 1/20]
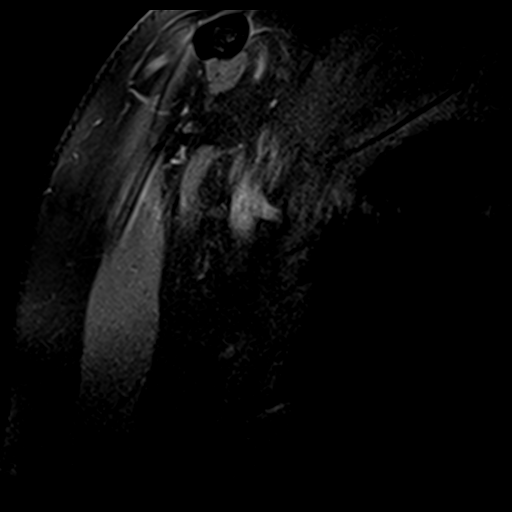
[im 4/20]
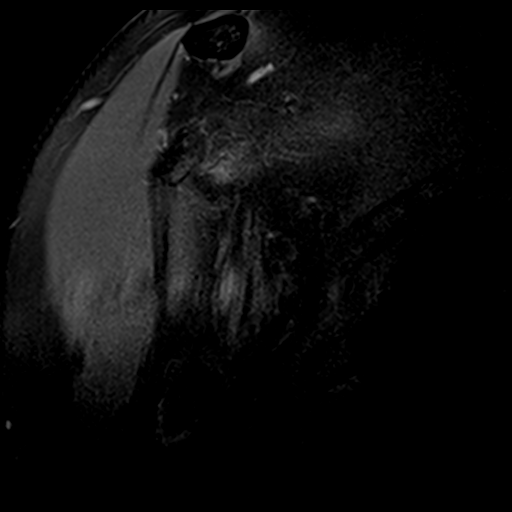
[im 7/20]
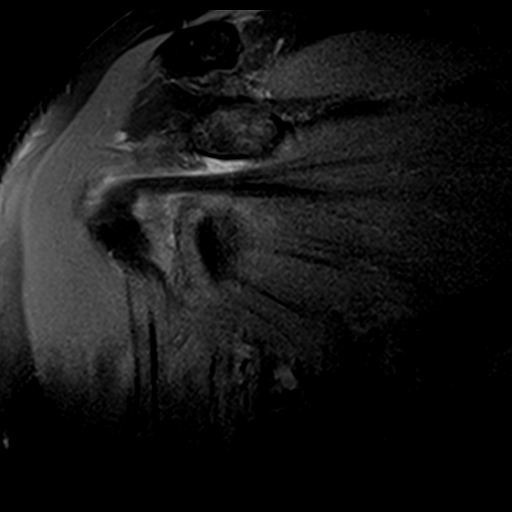
[im 10/20]
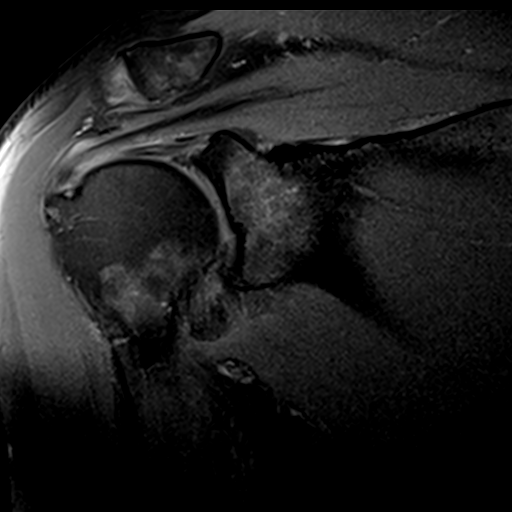
[im 16/20]
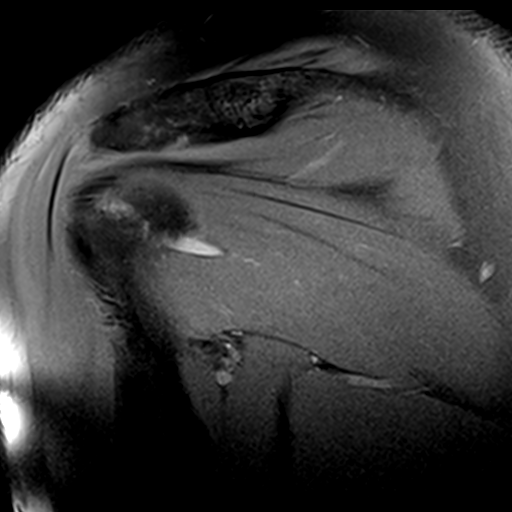

[Series 6: T2 fat-sat · oblique · 4.0mm · 0.29mm/px · 3 of 24 slices shown (2 of 2)]
[im 3/24]
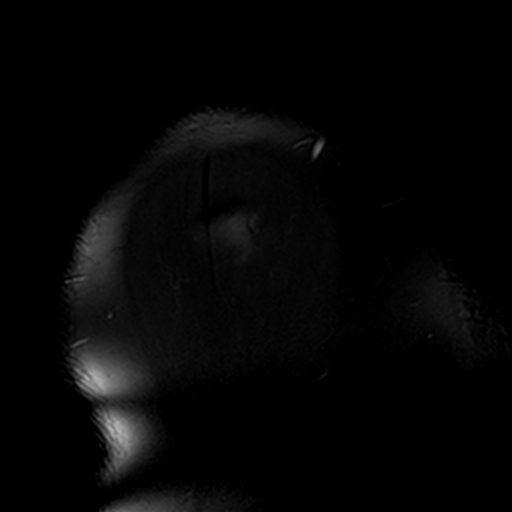
[im 12/24]
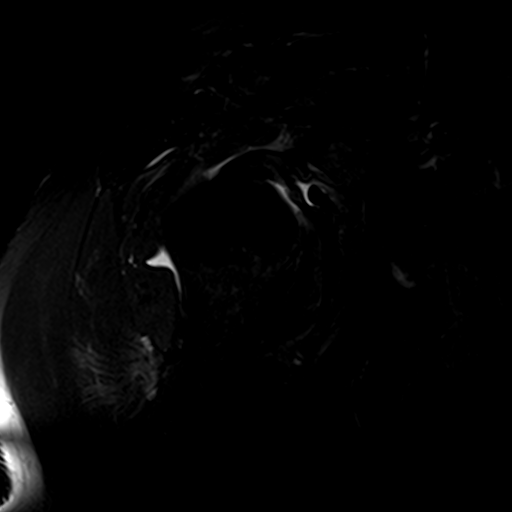
[im 21/24]
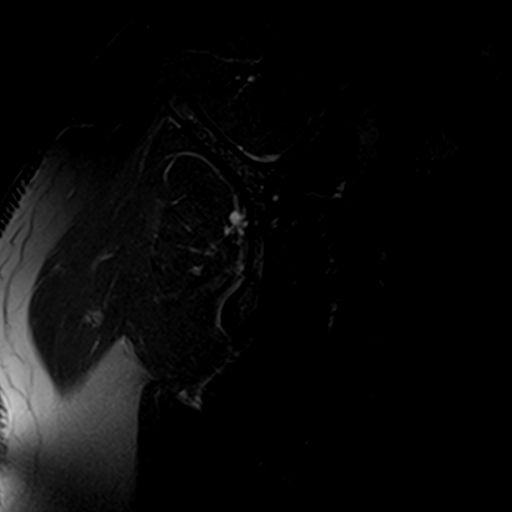

[19 of 40 positions shown; findings below may reference images not displayed]

FINDINGS: Rotator cuff: Severe tendinosis of the supraspinatus tendon with a
small partial-thickness articular surface tear. Infraspinatus tendon
is intact. Teres minor tendon is intact. Subscapularis tendon is
intact.

Muscles: No atrophy or fatty replacement of nor abnormal signal
within, the muscles of the rotator cuff.

Biceps long head: Moderate tendinosis of the intra-articular portion
of the long head of the biceps tendon.

Acromioclavicular Joint: Moderate arthropathy of the
acromioclavicular joint. Type I acromion. No subacromial/subdeltoid
bursal fluid.

Glenohumeral Joint: No joint effusion. No chondral defect.

Labrum: Grossly intact, but evaluation is limited by lack of
intraarticular fluid.

Bones:  No acute osseous abnormality.  No aggressive osseous lesion.

Other: No fluid collection or hematoma.
IMPRESSION: 1. Severe tendinosis of the supraspinatus tendon with a small
partial-thickness articular surface tear.
2. Moderate tendinosis of the intra-articular portion of the long
head of the biceps tendon.

## 2021-02-03 ENCOUNTER — Other Ambulatory Visit: Payer: Self-pay

## 2021-02-03 ENCOUNTER — Ambulatory Visit (HOSPITAL_COMMUNITY): Payer: Medicare HMO | Attending: Orthopedic Surgery

## 2021-02-03 DIAGNOSIS — R262 Difficulty in walking, not elsewhere classified: Secondary | ICD-10-CM | POA: Diagnosis not present

## 2021-02-03 DIAGNOSIS — M6281 Muscle weakness (generalized): Secondary | ICD-10-CM | POA: Insufficient documentation

## 2021-02-03 DIAGNOSIS — M25551 Pain in right hip: Secondary | ICD-10-CM | POA: Diagnosis not present

## 2021-02-03 DIAGNOSIS — M25552 Pain in left hip: Secondary | ICD-10-CM | POA: Insufficient documentation

## 2021-02-03 NOTE — Patient Instructions (Signed)
Access Code: [email or web address] URL: [email or web address] Date: 02/03/2021 Prepared by: Shary Decamp  Exercises Supine Hip Adductor Stretch - 1 x daily - 7 x weekly - 3 sets - 3 reps - 30-60 sec hold Hip Flexor Stretch at Edge of Bed - 1 x daily - 7 x weekly - 3 sets - 3 reps - 30-60 sec hold

## 2021-02-03 NOTE — Therapy (Signed)
[facility] [address] [place], [place], [address] Phone: [phone]   Fax:  [phone]  Physical Therapy Evaluation  Patient Details  Name: Sean Horn MRN: [id number] Date of Birth: 1961-02-19 Referring Provider (PT): Vickki Hearing, MD   Encounter Date: 02/03/2021   PT End of Session - 02/03/21 1120     Visit Number 1    Number of Visits 6    Date for PT Re-Evaluation 03/17/21    Authorization Type [employer] Medicare HMO, no auth, no VL    PT Start Time 1120    PT Stop Time 1205    PT Time Calculation (min) 45 min    Activity Tolerance Patient tolerated treatment well    Behavior During Therapy [facility] for tasks assessed/performed             Past Medical History:  Diagnosis Date   Anxiety    CAD in native artery    a. cath 06/26/2019 - 80% apical LAD >> medical management    Chronic headaches    Chronic neck pain    Coronary artery disease involving native coronary artery of native heart without angina pectoris 11/06/2019   GERD (gastroesophageal reflux disease)    occasional - OTC as needed   History of chicken pox    History of kidney stones    History of shingles    Hypertension    under control with med., had been on med. 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   And left now    SVT (supraventricular tachycardia) (HCC) 06/26/2019   Tachycardia    TIA (transient ischemic attack) 11/03/2018   Type 2 diabetes mellitus with complication, with long-term current  use of insulin (HCC)    Follows with endocrinology, Dr Chestine Spore reports his last hgba1c was 7.3    Urinary frequency 12/11/2016    Past Surgical History:  Procedure Laterality Date   ANTERIOR CERVICAL DECOMP/DISCECTOMY FUSION N/A 01/16/2018   Procedure: ANTERIOR CERVICAL DECOMPRESSION/DISCECTOMY FUSION CERVICAL FOUR-FIVE, CERVICAL FIVE-SIX;  Surgeon: Lisbeth Renshaw, MD;  Location: [facility] OR;  Service: Neurosurgery;  Laterality: N/A;  ANTERIOR CERVICAL DECOMPRESSION/DISCECTOMY FUSION CERVICAL FOUR-FIVE, CERVICAL FIVE-SIX   CHOLECYSTECTOMY  1980's   CYSTOSCOPY/RETROGRADE/URETEROSCOPY/STONE EXTRACTION WITH BASKET Right 08/27/2003   and double J stent placement   ELECTROPHYSIOLOGY STUDY N/A 07/23/2019   Procedure: ELECTROPHYSIOLOGY STUDY;  Surgeon: Marinus Maw, MD;  [facility]: [facility] INVASIVE CV LAB;  Service: Cardiovascular;  Laterality: N/A;   INGUINAL HERNIA REPAIR Left 1990's   KNEE ARTHROSCOPY Left 1980's   KNEE ARTHROSCOPY Right 2013   KNEE ARTHROSCOPY Right 06/30/2013   Procedure: RIGHT KNEE ARTHROSCOPY AND SYNOVECTOMY;  Surgeon: Velna Ochs, MD;  Location: [facility];  Service: Orthopedics;  Laterality: Right;   LEFT HEART CATH AND CORONARY ANGIOGRAPHY N/A 06/26/2019   Procedure: LEFT HEART CATH AND CORONARY ANGIOGRAPHY;  Surgeon: Marykay Lex, MD;  Location: [facility] INVASIVE CV LAB;  Service: Cardiovascular;  Laterality: N/A;   LITHOTRIPSY  multiple, b/l   SHOULDER ADHESION RELEASE Left ` 1998   SHOULDER ARTHROSCOPY W/ ROTATOR CUFF REPAIR Left 10/20/2004   x 4, had to have part of clavile removed. torn rotator cuff multiple times, had to place screws    SHOULDER ARTHROSCOPY W/ SUPERIOR LABRAL ANTERIOR POSTERIOR LESION REPAIR Left 12/22/2004   SVT ABLATION N/A 07/23/2019   Procedure: SVT ABLATION;  Surgeon: Marinus Maw, MD;  [facility] INV[facility] CV LAB;  Service: Cardiovascular;  Laterality: N/A;   TOTAL KNEE ARTHROPLASTY Right 12/23/2012   x3, 2 arthroscopies and a  TKR,  TOTAL KNEE ARTHROPLASTY;  Surgeon: Velna Ochs, MD;  Location: [facility] OR;  Service: Orthopedics;  Laterality: Right;  DEPUY, RNFA   WRIST SURGERY Right    x 3, torn ligaments, pins placed then infected, then fused with plate    There were no vitals filed for this visit.    Subjective Assessment - 02/03/21 1123     Subjective Pt reports bilateral hip/groin pain about 1.5 months and notes increased problems with bending forward/squatting and notes LOB with these movements. Pt had x-ray performed which reveals bone spurs and OA of spine and femoral acetabular impingement    Limitations Lifting;Standing;Walking;House hold activities    Patient Stated Goals "Reduce my pain to be more active"    Currently in Pain? Yes    Pain Score 7     Pain Location Groin    Pain Orientation Right;Left    Pain Descriptors / Indicators Aching;Sore    Pain Type Chronic pain    Pain Radiating Towards buttocks    Pain Onset More than a month ago    Pain Frequency Intermittent    Aggravating Factors  bending forward, getting socks/shoes on, stairs                [facility] PT Assessment - 02/03/21 0001       Assessment   Medical Diagnosis LBP+radiculopathy, bilateral FAI    Referring Provider (PT) Vickki Hearing, MD    Prior Therapy for right TKR and shoulders      Balance Screen   Has the patient fallen in the past 6 months No    Has the patient had a decrease in activity level because of a fear of falling?  No    Is the patient reluctant to leave their home because of a fear of falling?  No      Home Environment   Living Environment Private residence    Living Arrangements Alone    Type of Home House    Home Access Ramped entrance    Home Layout One level    Home Equipment [place] - single point;Shower seat - built in   assist rails in BR     Prior Function   Level of Independence Independent    Leisure hunting/fishing      Observation/Other Assessments   Focus on Therapeutic  Outcomes (FOTO)  37.3% function      ROM / Strength   AROM / PROM / Strength AROM;Strength      AROM   AROM Assessment Site Hip;Lumbar    Right/Left Hip Right;Left    Right Hip Extension 5    Right Hip Flexion 100    Right Hip Internal Rotation  20    Right Hip ABduction 30    Left Hip Extension 5    Left Hip Flexion 100    Left Hip Internal Rotation  20    Left Hip ABduction 30  Lumbar Flexion 10% limited    Lumbar Extension 10% limited      Strength   Strength Assessment Site Lumbar;Hip    Right/Left Hip Right;Left    Right Hip Extension 3-/5    Right Hip ABduction 3/5    Left Hip Extension 3-/5    Left Hip ABduction 3/5    Lumbar Flexion 2+/5    Lumbar Extension 3-/5      Special Tests    Special Tests Hip Special Tests    Hip Special Tests  Luisa Hart (FABER) Test;Hip Scouring;Thomas Test      Bauxite (FABER) Test   Findings Positive    Side Right   Lt     Thomas Test    Findings Positive    Side Right   LT     Hip Scouring   Findings Positive    Side Right   Lt     Ambulation/Gait   Ambulation/Gait Yes    Ambulation/Gait Assistance 7: Independent    Ambulation Distance (Feet) 250 Feet    Assistive device None    Gait Pattern Step-through pattern    Ambulation Surface Level;Indoor    Gait Comments                        Objective measurements completed on examination: See above findings.       [facility] Adult PT Treatment/Exercise - 02/03/21 0001       Exercises   Exercises Knee/Hip      Knee/Hip Exercises: Stretches   Hip Flexor Stretch Both;1 rep;60 seconds    Other Knee/Hip Stretches hip adductor stretch                    PT Education - 02/03/21 1220     Education Details assessment findings, HEP initiation, encouraged to pursue Silver Sneakers program    Person(s) Educated Patient    Methods Explanation    Comprehension Verbalized understanding              PT Short Term Goals - 02/03/21 1224        PT SHORT TERM GOAL #1   Title Patient will be independent with HEP in order to improve functional outcomes.    Time 3    Period Weeks    Status New    Target Date 02/24/21      PT SHORT TERM GOAL #2   Title Patient will report at least 25% improvement in symptoms for improved quality of life.    Baseline 7/10 pain with donning socks and shoes    Time 3    Period Weeks    Status New    Target Date 02/24/21      PT SHORT TERM GOAL #3   Title Patient will be able to ambulate at least 300 feet in in order to demonstrate improved gait speed for community ambulation.    Baseline 250 ft distance    Time 3    Period Weeks    Status New    Target Date 02/24/21               PT Long Term Goals - 02/03/21 1226       PT LONG TERM GOAL #1   Title Demo 4/5 bilateral hip abduction and trunk strength to improve stability and activity tolerance    Baseline 2+/5 to 3-/5    Time 6    Period Weeks    Status New  Target Date 03/17/21      PT LONG TERM GOAL #2   Title Patient will improve FOTO score by at least 5 points in order to indicate improved tolerance to activity.    Baseline 37.3% function    Time 6    Period Weeks    Status New    Target Date 03/17/21                    Plan - 02/03/21 1222     Clinical Impression Statement Patient is a 60 yo male presenting to physical therapy with c/o bilateral hip/groin/LBP. He presents with pain limited deficits in hip/core/trunk strength, ROM, endurance, postural impairments, spinal mobility and functional mobility with ADL. He is having to modify and restrict ADL as indicated by FOTO score as well as subjective information and objective measures which is affecting overall participation. Patient will benefit from skilled physical therapy in order to improve function and reduce impairment.    Personal Factors and Comorbidities Age;Comorbidity 3+;Time since onset of injury/illness/exacerbation    Comorbidities  multiple ortho issues, HTN, DM    Examination-Activity Limitations Bend;Carry;Lift;Stairs;Squat;Reach Overhead;Locomotion Level    Examination-Participation Restrictions Cleaning;Community Activity;Laundry;Yard Work;Occupation    Stability/Clinical Decision Making Evolving/Moderate complexity    Clinical Decision Making Moderate    Rehab Potential Good    PT Frequency 1x / week    PT Duration 6 weeks    PT Treatment/Interventions ADLs/Self Care Home Management;Aquatic Therapy;DME Instruction;Traction;Gait training;Stair training;Functional mobility training;Therapeutic activities;Therapeutic exercise;Balance training;Patient/family education;Dry needling    PT Next Visit Plan hip flexibility, core/hip strength    PT Home Exercise Plan hip adductors, Thomas test stretch    Consulted and Agree with Plan of Care Patient             Patient will benefit from skilled therapeutic intervention in order to improve the following deficits and impairments:  Abnormal gait, Decreased activity tolerance, Decreased mobility, Decreased endurance, Decreased range of motion, Decreased strength, Hypomobility, Difficulty walking, Impaired flexibility, Postural dysfunction, Improper body mechanics, Pain  Visit Diagnosis: Bilateral hip pain  Muscle weakness (generalized)  Difficulty in walking, not elsewhere classified     Problem List Patient Active Problem List   Diagnosis Date Noted   Hyperbilirubinemia 11/15/2020   PONV (postoperative nausea and vomiting)    Non-insulin dependent type 2 diabetes mellitus (HCC)    Chronic neck pain    Chronic headaches    CAD in native artery    Coronary artery disease involving native coronary artery of native heart without angina pectoris 11/06/2019   Diastolic dysfunction 11/06/2019   SVT (supraventricular tachycardia) (HCC) 06/26/2019   Non-ST elevation (NSTEMI) myocardial infarction (HCC) 06/26/2019   Tachycardia 06/25/2019   Hypertriglyceridemia  06/16/2019   Palpitations 06/12/2019   Morning headache 12/02/2018   Insomnia secondary to chronic pain 12/02/2018   Retrognathia 12/02/2018   TIA (transient ischemic attack) 11/03/2018   Cervical stenosis of spine 01/16/2018   Hyperlipidemia 07/19/2017   Obesity 01/30/2017   Urinary frequency 12/11/2016   Insomnia 09/13/2016   History of chicken pox    History of shingles    Preventative health care 08/14/2013   Anxiety    History of kidney stones    GERD (gastroesophageal reflux disease)    Type 2 diabetes mellitus with complication, with long-term current use of insulin (HCC)    Hypertension    Osteoarthritis    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 Shary Decamp,  PT, DPT Physical TherapistDolores Lory- [place] Office Number: [phone]   [facility] [address] [place], [place], [address] Phone: [phone]   Fax:  [phone]  Name: Sean Horn MRN: [id number] Date of Birth: 05/11/1961

## 2021-02-08 ENCOUNTER — Telehealth (HOSPITAL_COMMUNITY): Payer: Self-pay

## 2021-02-08 NOTE — Telephone Encounter (Signed)
Boyd Kerbs called to cx - Sean Horn is sick and will not be here

## 2021-02-09 ENCOUNTER — Encounter (HOSPITAL_COMMUNITY): Payer: Medicare HMO | Admitting: Physical Therapy

## 2021-02-14 ENCOUNTER — Other Ambulatory Visit: Payer: Self-pay | Admitting: Family Medicine

## 2021-02-14 DIAGNOSIS — G8929 Other chronic pain: Secondary | ICD-10-CM

## 2021-02-14 DIAGNOSIS — M2550 Pain in unspecified joint: Secondary | ICD-10-CM

## 2021-02-17 ENCOUNTER — Other Ambulatory Visit: Payer: Self-pay

## 2021-02-17 ENCOUNTER — Encounter (HOSPITAL_COMMUNITY): Payer: Self-pay

## 2021-02-17 ENCOUNTER — Ambulatory Visit (HOSPITAL_COMMUNITY): Payer: Medicare HMO

## 2021-02-17 DIAGNOSIS — M25552 Pain in left hip: Secondary | ICD-10-CM | POA: Diagnosis not present

## 2021-02-17 DIAGNOSIS — M25551 Pain in right hip: Secondary | ICD-10-CM | POA: Diagnosis not present

## 2021-02-17 DIAGNOSIS — M6281 Muscle weakness (generalized): Secondary | ICD-10-CM

## 2021-02-17 DIAGNOSIS — R262 Difficulty in walking, not elsewhere classified: Secondary | ICD-10-CM

## 2021-02-17 NOTE — Therapy (Signed)
[facility] [address] [place], [place], [address] Phone: [phone]   Fax:  [phone]  Physical Therapy Treatment  Patient Details  Name: Sean KneeCharles W Mahar MRN: [id number] Date of Birth: 03/20/1961 Referring Provider (PT): Vickki HearingHarrison, Stanley E, MD   Encounter Date: 02/17/2021   PT End of Session - 02/17/21 1408     Visit Number 2    Number of Visits 6    Date for PT Re-Evaluation 03/17/21    Authorization Type [employer] Medicare HMO, no auth, no VL    PT Start Time 1403    PT Stop Time 1445    PT Time Calculation (min) 42 min    Activity Tolerance Patient tolerated treatment well    Behavior [facility] Therapy [facility] for tasks assessed/performed             Past Medical History:  Diagnosis Date   Anxiety    CAD in native artery    a. cath 06/26/2019 - 80% apical LAD >> medical management    Chronic headaches    Chronic neck pain    Coronary artery disease involving native coronary artery of native heart without angina pectoris 11/06/2019   GERD (gastroesophageal reflux disease)    occasional - OTC as needed   History of chicken pox    History of kidney stones    History of shingles    Hypertension    under control with med., had been on med. since age 89   Insomnia 09/13/2016   Insulin dependent diabetes mellitus    Follows with endocrinology, Dr Chestine Sporelark reports his last hgba1c was 7.3    Non-insulin dependent type 2 diabetes mellitus (HCC)    Non-ST elevation (NSTEMI) myocardial infarction (HCC) 06/26/2019   Obesity 01/30/2017   Occipital neuralgia    Osteoarthritis    right Horn   PONV (postoperative nausea and vomiting)    PONV (postoperative nausea and vomiting)    Preventative health care 08/14/2013   Right Horn DJD 12/23/2012   And left now    SVT (supraventricular tachycardia) (HCC) 06/26/2019   Tachycardia    TIA (transient ischemic attack) 11/03/2018   Type 2 diabetes mellitus with complication, with long-term current  use of insulin (HCC)    Follows with endocrinology, Dr Chestine Sporelark reports his last hgba1c was 7.3    Urinary frequency 12/11/2016    Past Surgical History:  Procedure Laterality Date   ANTERIOR CERVICAL DECOMP/DISCECTOMY FUSION N/A 01/16/2018   Procedure: ANTERIOR CERVICAL DECOMPRESSION/DISCECTOMY FUSION CERVICAL FOUR-FIVE, CERVICAL FIVE-SIX;  Surgeon: Lisbeth RenshawNundkumar, Neelesh, MD;  Location: [facility] OR;  Service: Neurosurgery;  Laterality: N/A;  ANTERIOR CERVICAL DECOMPRESSION/DISCECTOMY FUSION CERVICAL FOUR-FIVE, CERVICAL FIVE-SIX   CHOLECYSTECTOMY  1980's   CYSTOSCOPY/RETROGRADE/URETEROSCOPY/STONE EXTRACTION WITH BASKET Right 08/27/2003   and double J stent placement   ELECTROPHYSIOLOGY STUDY N/A 07/23/2019   Procedure: ELECTROPHYSIOLOGY STUDY;  Surgeon: Marinus Mawaylor, Gregg W, MD;  [facility] IN[facility] LAB;  Service: Cardiovascular;  Laterality: N/A;   INGUINAL HERNIA REPAIR Left 1990's   Horn ARTHROSCOPY Left 1980's   Horn ARTHROSCOPY Right 2013   Horn ARTHROSCOPY Right 06/30/2013   Procedure: RIGHT Horn ARTHROSCOPY AND SYNOVECTOMY;  Surgeon: Velna OchsPeter G Dalldorf, MD;  Location: [facility];  Service: Orthopedics;  Laterality: Right;   LEFT HEART CATH AND CORONARY ANGIOGRAPHY N/A 06/26/2019   Procedure: LEFT HEART CATH AND CORONARY ANGIOGRAPHY;  Surgeon: Marykay LexHarding, David W, MD;  [facility];  Service: Cardiovascular;  Laterality: N/A;   LITHOTRIPSY  multiple, b/l   SHOULDER ADHESION RELEASE Left ` 1998   SHOULDER ARTHROSCOPY W/ ROTATOR CUFF REPAIR Left 10/20/2004   x 4, had to have part of clavile removed. torn rotator cuff multiple times, had to place screws    SHOULDER ARTHROSCOPY W/ SUPERIOR LABRAL ANTERIOR POSTERIOR LESION REPAIR Left 12/22/2004   SVT ABLATION N/A 07/23/2019   Procedure: SVT ABLATION;  Surgeon: Marinus Maw, MD;  [facility];  Service: Cardiovascular;  Laterality: N/A;   TOTAL Horn ARTHROPLASTY Right 12/23/2012   x3, 2 arthroscopies and a  TKR,  TOTAL Horn ARTHROPLASTY;  Surgeon: Velna Ochs, MD;  [facility];  Service: Orthopedics;  Laterality: Right;  DEPUY, RNFA   WRIST SURGERY Right    x 3, torn ligaments, pins placed then infected, then fused with plate    There were no vitals filed for this visit.   Subjective Assessment - 02/17/21 1406     Subjective Pt reports compliance iwht HEP and stated Lt groin is feeling better.  Continues to hace Rt hip/groin pain    Patient Stated Goals "Reduce my pain to be more active"    Currently in Pain? Yes    Pain Score 6     Pain Location Groin    Pain Orientation Right    Pain Descriptors / Indicators Aching;Dull    Pain Type Chronic pain    Pain Onset More than a month ago    Pain Frequency Intermittent    Aggravating Factors  bending forward, getting socks/shoes on stairs    Pain Relieving Factors traction on edge of bed                               [facility] Adult PT Treatment/Exercise - 02/17/21 0001       Exercises   Exercises Horn/Hip      Horn/Hip Exercises: Stretches   Lobbyist Both;3 reps;30 seconds    Quad Stretch Limitations prone wiht rope    Hip Flexor Stretch 3 reps;30 seconds;Right;Left    Other Horn/Hip Stretches butterfly hip adductor stretch 3x 30"    Other Horn/Hip Stretches LTR 5x 10"      Horn/Hip Exercises: Supine   Bridges 2 sets;10 reps      Horn/Hip Exercises: Sidelying   Hip ABduction 10 reps      Horn/Hip Exercises: Prone   Hamstring Curl 10 reps    Hamstring Curl Limitations 3#    Hip Extension 10 reps                    PT Education - 02/17/21 1432     Education Details Reviewed goals, educated importance of HEP compliance for maximal benefits, pt able to recall and demonstrate appropriate home based exercises.    Person(s) Educated Patient    Methods Explanation    Comprehension Verbalized understanding;Returned demonstration              PT Short Term Goals - 02/03/21 1224        PT SHORT TERM GOAL #1   Title Patient will be independent with HEP in order to improve functional outcomes.    Time 3    Period Weeks    Status New    Target Date 02/24/21      PT SHORT TERM GOAL #2   Title Patient will report at least 25% improvement in symptoms for improved quality of life.  Baseline 7/10 pain with donning socks and shoes    Time 3    Period Weeks    Status New    Target Date 02/24/21      PT SHORT TERM GOAL #3   Title Patient will be able to ambulate at least 300 feet in in order to demonstrate improved gait speed for community ambulation.    Baseline 250 ft distance    Time 3    Period Weeks    Status New    Target Date 02/24/21               PT Long Term Goals - 02/03/21 1226       PT LONG TERM GOAL #1   Title Demo 4/5 bilateral hip abduction and trunk strength to improve stability and activity tolerance    Baseline 2+/5 to 3-/5    Time 6    Period Weeks    Status New    Target Date 03/17/21      PT LONG TERM GOAL #2   Title Patient will improve FOTO score by at least 5 points in order to indicate improved tolerance to activity.    Baseline 37.3% function    Time 6    Period Weeks    Status New    Target Date 03/17/21                   Plan - 02/17/21 1434     Clinical Impression Statement Reviewed goals, educated importance of HEP compliance for maximal benefits, pt able to recall and demonstrate appropriate exercises.  Session focus on hip mobility and proximal strengthening.  Pt encouraged to complete all stretches and exercises in pain free range.  Pt tolerated well to session, reports pain reduced to 4/10 at EOS.    Personal Factors and Comorbidities Age;Comorbidity 3+;Time since onset of injury/illness/exacerbation    Comorbidities multiple ortho issues, HTN, DM    Examination-Activity Limitations Bend;Carry;Lift;Stairs;Squat;Reach Overhead;Locomotion Level    Examination-Participation Restrictions  Cleaning;Community Activity;Laundry;Yard Work;Occupation    Stability/Clinical Decision Making Evolving/Moderate complexity    Clinical Decision Making Moderate    Rehab Potential Good    PT Frequency 1x / week    PT Duration 6 weeks    PT Treatment/Interventions ADLs/Self Care Home Management;Aquatic Therapy;DME Instruction;Traction;Gait training;Stair training;Functional mobility training;Therapeutic activities;Therapeutic exercise;Balance training;Patient/family education;Dry needling    PT Next Visit Plan hip flexibility, core/hip strength    PT Home Exercise Plan hip adductors, Thomas test stretch; 02/17/21: bridge    Consulted and Agree with Plan of Care Patient             Patient will benefit from skilled therapeutic intervention in order to improve the following deficits and impairments:  Abnormal gait, Decreased activity tolerance, Decreased mobility, Decreased endurance, Decreased range of motion, Decreased strength, Hypomobility, Difficulty walking, Impaired flexibility, Postural dysfunction, Improper body mechanics, Pain  Visit Diagnosis: Bilateral hip pain  Muscle weakness (generalized)  Difficulty in walking, not elsewhere classified     Problem List Patient Active Problem List   Diagnosis Date Noted   Hyperbilirubinemia 11/15/2020   PONV (postoperative nausea and vomiting)    Non-insulin dependent type 2 diabetes mellitus (HCC)    Chronic neck pain    Chronic headaches    CAD in native artery    Coronary artery disease involving native coronary artery of native heart without angina pectoris 11/06/2019   Diastolic dysfunction 11/06/2019   SVT (supraventricular tachycardia) (HCC) 06/26/2019   Non-ST elevation (NSTEMI)  myocardial infarction (HCC) 06/26/2019   Tachycardia 06/25/2019   Hypertriglyceridemia 06/16/2019   Palpitations 06/12/2019   Morning headache 12/02/2018   Insomnia secondary to chronic pain 12/02/2018   Retrognathia 12/02/2018   TIA  (transient ischemic attack) 11/03/2018   Cervical stenosis of spine 01/16/2018   Hyperlipidemia 07/19/2017   Obesity 01/30/2017   Urinary frequency 12/11/2016   Insomnia 09/13/2016   History of chicken pox    History of shingles    Preventative health care 08/14/2013   Anxiety    History of kidney stones    GERD (gastroesophageal reflux disease)    Type 2 diabetes mellitus with complication, with long-term current use of insulin (HCC)    Hypertension    Osteoarthritis    Right Horn DJD 12/23/2012    Class: Chronic   Becky Sax, LPTA/CLT; CBIS [phone]  Juel Burrow 02/17/2021, 3:00 PM  [facility] [address] [place], [place], [address] Phone: [phone]   Fax:  [phone]  Name: OMARE BILOTTA MRN: [id number] Date of Birth: [birth year]

## 2021-02-24 ENCOUNTER — Ambulatory Visit (HOSPITAL_COMMUNITY): Payer: Medicare HMO

## 2021-03-03 ENCOUNTER — Ambulatory Visit (HOSPITAL_COMMUNITY): Payer: Medicare HMO

## 2021-03-03 ENCOUNTER — Telehealth (HOSPITAL_COMMUNITY): Payer: Self-pay

## 2021-03-03 NOTE — Telephone Encounter (Signed)
No call, no show.  11:44 AM, 03/03/21 M. Shary Decamp, PT, DPT Physical Therapist- [place] Office Number: [phone]

## 2021-03-09 ENCOUNTER — Ambulatory Visit: Payer: Medicare HMO

## 2021-03-09 ENCOUNTER — Other Ambulatory Visit: Payer: Self-pay

## 2021-03-09 ENCOUNTER — Encounter: Payer: Self-pay | Admitting: Orthopedic Surgery

## 2021-03-09 ENCOUNTER — Ambulatory Visit (INDEPENDENT_AMBULATORY_CARE_PROVIDER_SITE_OTHER): Payer: Medicare HMO | Admitting: Orthopedic Surgery

## 2021-03-09 VITALS — BP 139/96 | HR 80 | Ht 72.0 in | Wt 225.0 lb

## 2021-03-09 DIAGNOSIS — G8918 Other acute postprocedural pain: Secondary | ICD-10-CM | POA: Diagnosis not present

## 2021-03-09 DIAGNOSIS — M25552 Pain in left hip: Secondary | ICD-10-CM | POA: Diagnosis not present

## 2021-03-09 DIAGNOSIS — M25551 Pain in right hip: Secondary | ICD-10-CM

## 2021-03-09 DIAGNOSIS — M5410 Radiculopathy, site unspecified: Secondary | ICD-10-CM | POA: Diagnosis not present

## 2021-03-09 MED ORDER — METHOCARBAMOL 750 MG PO TABS: 750.0000 mg | ORAL_TABLET | Freq: Four times a day (QID) | ORAL | 2 refills | Status: DC

## 2021-03-09 NOTE — Patient Instructions (Addendum)
Continue exercises   Start muscle relaxers, use heat, call Dr Conchita Paris

## 2021-03-09 NOTE — Progress Notes (Signed)
Established patient new problem   Chief Complaint  Patient presents with   Back Pain    Better with exercises   Neck Pain    New problem/ neck painful for about a week no injury     HPI  Encounter Diagnoses  Name Primary?   Worsening of neck pain following surgery Yes   Radicular pain of both lower extremities    Hip pain, bilateral    Sean Horn was seen for his radicular pain and back pain and hip pain and is doing better with exercises though still having some residual symptoms  However he is also status post anterior decompression and fusion with discectomy in 2019 by Dr. Conchita Horn presents with acute onset of pain in his cervical spine.  He says my neck is "jacked up"  He has pain with flexion extension he has tension and muscle spasms up around the cervical spine and shoulders but no numbness or tingling in his hands or arms and denies any weakness  Body mass index is 30.52 kg/m.  BP (!) 139/96   Pulse 80   Ht 6' (1.829 m)   Wt 225 lb (102.1 kg)   BMI 30.52 kg/m   Past Medical History:  Diagnosis Date   Anxiety    CAD in native artery    a. cath 06/26/2019 - 80% apical LAD >> medical management    Chronic headaches    Chronic neck pain    Coronary artery disease involving native coronary artery of native heart without angina pectoris 11/06/2019   GERD (gastroesophageal reflux disease)    occasional - OTC as needed   History of chicken pox    History of kidney stones    History of shingles    Hypertension    under control with med., had been on med. 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  Mental status oriented x3 mood and affect no depression Cardiovascular pulses and temperature normal   Gait remains normal  Musculoskeletal:  Inspection: Cervical spine is tender decreased range of motion painful flexion extension Range of motion: Normal range of motion in the upper extremities Stability: Normal stability [name] both sides of the upper extremities with shoulder wrist and hand and elbow stable Muscle strength and tone: Normal C5-T1  Skin warm dry and intact no erythema  Neurological sensation normal  Assessment and plan:  X-ray shows an anterior cervical fusion with instrumentation from C4-C6  X-rays back in 2002 and 2005 I have reviewed I interpret those as degenerative disc disease this was prior  to the fusion  Not sure if this is anything more than acute muscle spasms however I would prefer he call the neurosurgeon to correspond I did put him on some muscle relaxers  Meds ordered this encounter  Medications   methocarbamol (ROBAXIN) 750 MG tablet    Sig: Take 1 tablet (750 mg total) by mouth 4 (four) times daily.    Dispense:  60 tablet    Refill:  2   Encounter Diagnoses  Name Primary?   Worsening of neck pain following surgery Yes   Radicular pain of both lower extremities    Hip pain, bilateral    Bilateral hip pain improved Lower back pain improved Acute uncomplicated at present Internal images Prescription management

## 2021-03-10 ENCOUNTER — Encounter (HOSPITAL_COMMUNITY): Payer: Medicare HMO

## 2021-03-14 ENCOUNTER — Other Ambulatory Visit: Payer: Self-pay | Admitting: Family Medicine

## 2021-03-14 ENCOUNTER — Other Ambulatory Visit: Payer: Self-pay | Admitting: Internal Medicine

## 2021-03-14 DIAGNOSIS — F419 Anxiety disorder, unspecified: Secondary | ICD-10-CM

## 2021-03-16 ENCOUNTER — Telehealth (HOSPITAL_COMMUNITY): Payer: Self-pay | Admitting: Physical Therapy

## 2021-03-16 ENCOUNTER — Ambulatory Visit (HOSPITAL_COMMUNITY): Payer: Medicare HMO | Attending: Orthopedic Surgery | Admitting: Physical Therapy

## 2021-03-16 NOTE — Telephone Encounter (Signed)
2nd no show:  Left message that pt does not have any future appointments and if he wants to be seen he will need to call the clinic.  Virgina Organ, PT CLT [phone]

## 2021-03-17 ENCOUNTER — Encounter (HOSPITAL_COMMUNITY): Payer: Medicare HMO

## 2021-03-21 ENCOUNTER — Telehealth: Payer: Self-pay | Admitting: Family Medicine

## 2021-03-21 NOTE — Telephone Encounter (Signed)
Left message for patient to call back and schedule Medicare Annual Wellness Visit (AWV) in office.   If not able to come in office, please offer to do virtually or by telephone.  Left office number and my jabber #[phone].  Due for AWVI  Please schedule at anytime with Nurse Health Advisor.   

## 2021-03-30 ENCOUNTER — Other Ambulatory Visit: Payer: Self-pay | Admitting: Internal Medicine

## 2021-04-07 ENCOUNTER — Other Ambulatory Visit: Payer: Self-pay | Admitting: Family Medicine

## 2021-04-07 DIAGNOSIS — G8929 Other chronic pain: Secondary | ICD-10-CM

## 2021-04-07 DIAGNOSIS — M2550 Pain in unspecified joint: Secondary | ICD-10-CM

## 2021-04-07 NOTE — Telephone Encounter (Signed)
Okay to refill?   Controlled RX   Pt was last seen April 2022 and last filled 02/14/21.

## 2021-04-12 ENCOUNTER — Other Ambulatory Visit: Payer: Self-pay

## 2021-04-12 MED ORDER — ISOSORBIDE MONONITRATE ER 30 MG PO TB24: 30.0000 mg | ORAL_TABLET | Freq: Every day | ORAL | 0 refills | Status: DC

## 2021-04-12 NOTE — Telephone Encounter (Signed)
Isosorbide Mono ER 30 mg # 30 tablets only with message patient needs appointment for further refills / 1st attempt Sent to [facility] [place], [place]

## 2021-04-17 ENCOUNTER — Telehealth: Payer: Self-pay | Admitting: *Deleted

## 2021-04-17 NOTE — Chronic Care Management (AMB) (Signed)
  Chronic Care Management   Outreach Note  04/17/2021 Name: DEMARIUS ARCHILA MRN: [id number] DOB: 1961/05/02  Sean Horn is a 60 y.o. year old male who is a primary care patient of Copland, Gwenlyn Found, MD. I reached out to Sean Horn by phone today in response to a referral sent by [name]'s PCP Copland, Gwenlyn Found, MD     An unsuccessful telephone outreach was attempted today. The patient was referred to the case management team for assistance with care management and care coordination.   Follow Up Plan: A HIPAA compliant phone message was left for the patient providing contact information and requesting a return call.  If patient returns call to provider office, please advise to call Embedded Care Management Care Guide Sean Horn at [phone]  Burman Nieves, CCMA Care Guide, Embedded Care Coordination [facility]  Care Management  Direct Dial: [phone]

## 2021-04-20 NOTE — Chronic Care Management (AMB) (Signed)
  Chronic Care Management   Outreach Note  04/20/2021 Name: Sean Horn MRN: [id number] DOB: 01-20-1961  Sean Horn is a 60 y.o. year old male who is a primary care patient of Copland, Gwenlyn Found, MD. I reached out to [name] by phone today in response to a referral sent by Sean Horn's PCP Copland, Gwenlyn Found, MD     A second unsuccessful telephone outreach was attempted today. The patient was referred to the case management team for assistance with care management and care coordination.   Follow Up Plan: A HIPAA compliant phone message was left for the patient providing contact information and requesting a return call.  If patient returns call to provider office, please advise to call Embedded Care Management Care Guide Emalyn Schou at [phone]  Sean Horn, CCMA Care Guide, Embedded Care Coordination [facility]  Care Management  Direct Dial: [phone]

## 2021-04-24 NOTE — Chronic Care Management (AMB) (Signed)
  Chronic Care Management   Outreach Note  04/24/2021 Name: Sean Horn MRN: [id number] DOB: 1960-12-24  Sean Horn is a 60 y.o. year old male who is a primary care patient of Copland, Gwenlyn Found, MD. I reached out to [name] by phone today in response to a referral sent by Sean Horn's PCP Copland, Gwenlyn Found, MD     Third unsuccessful telephone outreach was attempted today. The care management team is pleased to engage with this patient at any time in the future should he/she be interested in assistance from the care management team.   Follow Up Plan: We have been unable to make contact with the patient for follow up. The care management team is available to follow up with the patient after provider conversation with the patient regarding recommendation for care management engagement   Burman Nieves, CCMA Care Guide, Embedded Care Coordination [facility]  Care Management  Direct Dial: [phone]

## 2021-05-04 ENCOUNTER — Telehealth: Payer: Self-pay | Admitting: Orthopedic Surgery

## 2021-05-04 NOTE — Telephone Encounter (Signed)
Patient called, first to request appointment, which we have scheduled, due to hip pain still ongoing; patient is also on wait list. He then asked if Dr Romeo Apple would prescribe something? Said that Gabapentin is not helping enough.

## 2021-05-05 ENCOUNTER — Other Ambulatory Visit: Payer: Self-pay | Admitting: Orthopedic Surgery

## 2021-05-05 DIAGNOSIS — G8929 Other chronic pain: Secondary | ICD-10-CM

## 2021-05-05 MED ORDER — ACETAMINOPHEN-CODEINE #3 300-30 MG PO TABS: 1.0000 | ORAL_TABLET | Freq: Four times a day (QID) | ORAL | 0 refills | Status: AC | PRN

## 2021-05-05 NOTE — Progress Notes (Signed)
Meds ordered this encounter  Medications  . acetaminophen-codeine (TYLENOL #3) 300-30 MG tablet    Sig: Take 1 tablet by mouth every 6 (six) hours as needed for up to 5 days for moderate pain.    Dispense:  20 tablet    Refill:  0    

## 2021-05-06 ENCOUNTER — Other Ambulatory Visit: Payer: Self-pay | Admitting: Family Medicine

## 2021-05-06 DIAGNOSIS — G2581 Restless legs syndrome: Secondary | ICD-10-CM

## 2021-05-09 ENCOUNTER — Other Ambulatory Visit: Payer: Self-pay | Admitting: Orthopedic Surgery

## 2021-05-18 ENCOUNTER — Ambulatory Visit: Payer: Medicare HMO | Admitting: Orthopedic Surgery

## 2021-05-18 ENCOUNTER — Encounter: Payer: Self-pay | Admitting: Orthopedic Surgery

## 2021-05-18 ENCOUNTER — Other Ambulatory Visit: Payer: Self-pay

## 2021-05-18 VITALS — Ht 72.0 in | Wt 225.0 lb

## 2021-05-18 DIAGNOSIS — G8918 Other acute postprocedural pain: Secondary | ICD-10-CM

## 2021-05-18 DIAGNOSIS — M25551 Pain in right hip: Secondary | ICD-10-CM

## 2021-05-18 DIAGNOSIS — M2550 Pain in unspecified joint: Secondary | ICD-10-CM | POA: Diagnosis not present

## 2021-05-18 DIAGNOSIS — M25859 Other specified joint disorders, unspecified hip: Secondary | ICD-10-CM

## 2021-05-18 DIAGNOSIS — G8929 Other chronic pain: Secondary | ICD-10-CM

## 2021-05-18 DIAGNOSIS — M5126 Other intervertebral disc displacement, lumbar region: Secondary | ICD-10-CM | POA: Diagnosis not present

## 2021-05-18 DIAGNOSIS — M5410 Radiculopathy, site unspecified: Secondary | ICD-10-CM

## 2021-05-18 DIAGNOSIS — M25552 Pain in left hip: Secondary | ICD-10-CM

## 2021-05-18 DIAGNOSIS — S73191D Other sprain of right hip, subsequent encounter: Secondary | ICD-10-CM

## 2021-05-18 MED ORDER — ACETAMINOPHEN-CODEINE #3 300-30 MG PO TABS: 1.0000 | ORAL_TABLET | Freq: Four times a day (QID) | ORAL | 0 refills | Status: DC | PRN

## 2021-05-18 NOTE — Progress Notes (Signed)
FOLLOW UP   Encounter Diagnoses  Name Primary?   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 Tolson continues to have bilateral leg pain right worse than left with radiation into the right knee and occasionally into the right foot he has a dull aching pain that wakes him up at night occasionally he will get a catch in his hip and buttocks that will cause his right leg to give way.  He is having difficulty putting his shoes on as well.  He takes a Tylenol with codeine at night just to try to get some sleep and this is not been very effective  His situation is very difficult to delineate as he has complaints of hip pain with a normal hip x-ray except for some mild FAI  His examination today revealed that he is walking with a limp favoring his right leg.  The flexion internal rotation test did not really produce a lot of discomfort except around the buttock and lower back is straight leg raise was equivocal although he did have some lateral hip pain with that.  The point of maximal tenderness was in the right buttock.  He has failed conservative treatment he cannot take anti-inflammatories he is on Plavix we did try Tylenol and Tylenol with codeine  Recommend MRI of his lumbar spine as he has had some history of degenerative disc disease and I would like to do a contrast MRI of his right hip to rule out labral tear   Meds ordered this encounter  Medications   acetaminophen-codeine (TYLENOL #3) 300-30 MG tablet    Sig: Take 1 tablet by mouth every 6 (six) hours as needed for moderate pain.    Dispense:  30 tablet    Refill:  0

## 2021-05-18 NOTE — Patient Instructions (Signed)
While we are working on your approval for MRI please go ahead and call to schedule your appointment with [facility] within at least one (1) week.   Central Scheduling [phone]  

## 2021-05-25 ENCOUNTER — Other Ambulatory Visit: Payer: Self-pay | Admitting: Family Medicine

## 2021-05-25 ENCOUNTER — Other Ambulatory Visit: Payer: Self-pay | Admitting: Internal Medicine

## 2021-05-25 DIAGNOSIS — F419 Anxiety disorder, unspecified: Secondary | ICD-10-CM

## 2021-06-02 NOTE — Patient Instructions (Addendum)
Good to see you again today!  I will be in touch with your lab work asap Your BP is a bit on the high side today- please keep an eye on this at home and let me know if you are consistently higher than 140/90  Pneumonia shot and flu shot today I do recommend getting the newest covid booster asap, and shingles vaccine when you can  Good luck with your hip issue!

## 2021-06-02 NOTE — Progress Notes (Addendum)
[facility] [address], [address] [place], [place] [address] [phone]  Date:  06/05/2021   Name:  Sean Horn   DOB:  July 02, 1961   MRN:  [id number]  PCP:  Pearline Cables, MD    Chief Complaint: Medication Refill (Concerns/ questions: Pt says he can not afford the Endo Dr/Flu shot today: yes/)   History of Present Illness:  Sean Horn is a 60 y.o. very pleasant male patient who presents with the following:  Patient seen today for medication follow-up Most recent visit with myself about 6 months ago History of hypertension, NSTEMI/CAD, SVT status post catheter ablation in December 2020.  Poorly controlled diabetes, hypothyroidism  He has been under the care of orthopedics-most recently with concern of hip pain They plan to do an MRI of both hips and his back tomorrow.  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  He did not respond to me at that time-today he notes his seen endocrinology is too expensive and he would like for me to continue managing his diabetes He notes his blood sugars are mostly running 140 which is an improvement  He is seeing cardiology soon-he plans to make appt today   Lab Results  Component Value Date   HGBA1C 9.4 (H) 11/14/2020    COVID booster-encouraged Flu shot- give today  Eye exam- he did go earlier this year Shingrix-recommended Foot exam is due Needs pneumonia vaccine  BP Readings from Last 3 Encounters:  06/05/21 140/90  03/09/21 (!) 139/96  01/26/21 130/87    Patient Active Problem List   Diagnosis Date Noted   Hyperbilirubinemia 11/15/2020   PONV (postoperative nausea and vomiting)    Non-insulin dependent type 2 diabetes mellitus (HCC)    Chronic neck pain    Chronic headaches    CAD in native artery     Coronary artery disease involving native coronary artery of native heart without angina pectoris 11/06/2019   Diastolic dysfunction 11/06/2019   SVT (supraventricular tachycardia) (HCC) 06/26/2019   Non-ST elevation (NSTEMI) myocardial infarction (HCC) 06/26/2019   Tachycardia 06/25/2019   Hypertriglyceridemia 06/16/2019   Palpitations 06/12/2019   Morning headache 12/02/2018   Insomnia secondary to chronic pain 12/02/2018   Retrognathia 12/02/2018   TIA (transient ischemic attack) 11/03/2018   Cervical stenosis of spine 01/16/2018   Hyperlipidemia 07/19/2017   Obesity 01/30/2017   Urinary frequency 12/11/2016   Insomnia 09/13/2016   History of chicken pox    History of shingles    Preventative health care 08/14/2013   Anxiety    History of kidney stones    GERD (gastroesophageal reflux disease)    Type 2 diabetes mellitus with complication, with long-term current use of insulin (HCC)    Hypertension    Osteoarthritis    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 cath 06/26/2019 - 80% apical LAD >> medical management    Chronic headaches    Chronic neck pain    Coronary artery disease involving native coronary artery of native heart without angina pectoris 11/06/2019   GERD (gastroesophageal reflux disease)    occasional - OTC as needed  History of chicken pox    History of kidney stones    History of shingles    Hypertension    under control with med., had been on med. 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   And left now    SVT (supraventricular tachycardia) (HCC) 06/26/2019   Tachycardia    TIA (transient ischemic attack) 11/03/2018   Type 2 diabetes mellitus with complication, with long-term current use of insulin (HCC)    Follows with endocrinology, Dr Chestine Spore reports his last hgba1c was 7.3    Urinary frequency 12/11/2016    Past Surgical History:  Procedure Laterality Date   ANTERIOR CERVICAL DECOMP/DISCECTOMY FUSION N/A 01/16/2018   Procedure: ANTERIOR CERVICAL DECOMPRESSION/DISCECTOMY FUSION CERVICAL FOUR-FIVE, CERVICAL FIVE-SIX;  Surgeon: Lisbeth Renshaw, MD;  Location: [facility] OR;  Service: Neurosurgery;  Laterality: N/A;  ANTERIOR CERVICAL DECOMPRESSION/DISCECTOMY FUSION CERVICAL FOUR-FIVE, CERVICAL FIVE-SIX   CHOLECYSTECTOMY  1980's   CYSTOSCOPY/RETROGRADE/URETEROSCOPY/STONE EXTRACTION WITH BASKET Right 08/27/2003   and double J stent placement   ELECTROPHYSIOLOGY STUDY N/A 07/23/2019   Procedure: ELECTROPHYSIOLOGY STUDY;  Surgeon: Marinus Maw, MD;  Location: [facility] INVASIVE CV LAB;  Service: Cardiovascular;  Laterality: N/A;   INGUINAL HERNIA REPAIR Left 1990's   KNEE ARTHROSCOPY Left 1980's   KNEE ARTHROSCOPY Right 2013   KNEE ARTHROSCOPY Right 06/30/2013   Procedure: RIGHT KNEE ARTHROSCOPY AND SYNOVECTOMY;  Surgeon: Velna Ochs, MD;  Location: [facility];  Service: Orthopedics;  Laterality: Right;   LEFT HEART CATH AND CORONARY ANGIOGRAPHY N/A 06/26/2019   Procedure: LEFT HEART CATH AND CORONARY ANGIOGRAPHY;  Surgeon: Marykay Lex, MD;  [facility]VAS[facility] CV LAB;  Service: Cardiovascular;  Laterality: N/A;   LITHOTRIPSY     multiple, b/l   SHOULDER ADHESION RELEASE Left ` 1998   SHOULDER ARTHROSCOPY W/ ROTATOR CUFF REPAIR Left 10/20/2004   x 4, had to have part of clavile removed. torn rotator cuff multiple times, had to place screws    SHOULDER ARTHROSCOPY W/ SUPERIOR LABRAL  ANTERIOR POSTERIOR LESION REPAIR Left 12/22/2004   SVT ABLATION N/A 07/23/2019   Procedure: SVT ABLATION;  Surgeon: Marinus Maw, MD;  Location: [facility] INVASIVE CV LAB;  Service: Cardiovascular;  Laterality: N/A;   TOTAL KNEE ARTHROPLASTY Right 12/23/2012   x3, 2 arthroscopies and a TKR,  TOTAL KNEE ARTHROPLASTY;  Surgeon: Velna Ochs, MD;  Location: [facility] OR;  Service: Orthopedics;  Laterality: Right;  DEPUY, RNFA   WRIST SURGERY Right    x 3, torn ligaments, pins placed then infected, then fused with plate    Social History   Tobacco Use   Smoking status: Never   Smokeless tobacco: Current    Types: Snuff  Vaping Use   Vaping Use: Never used  Substance Use Topics   Alcohol use: No   Drug use: No    Family History  Problem Relation Age of Onset   COPD Mother        smoker   Hypertension Mother  Heart disease Mother        CHF   Diabetes Father    Heart disease Father        CABG in 25's   Stroke Father    Hypertension Father    Peripheral vascular disease Father    Hypertension Son        tachycardia   Heart disease Maternal Grandfather    Arthritis Paternal Grandmother    Colon cancer Neg Hx    Rectal cancer Neg Hx    Stomach cancer Neg Hx     Allergies  Allergen Reactions   Rosuvastatin Other (See Comments)   Toradol [Ketorolac Tromethamine] Itching    Medication list has been reviewed and updated.  Current Outpatient Medications on File Prior to Visit  Medication Sig Dispense Refill   acetaminophen (TYLENOL) 500 MG tablet Take 500 mg by mouth every 6 (six) hours as needed.     acetaminophen-codeine (TYLENOL #3) 300-30 MG tablet Take 1 tablet by mouth every 6 (six) hours as needed for moderate pain. 30 tablet 0   aspirin EC 81 MG EC tablet Take 1 tablet (81 mg total) by mouth daily. 90 tablet 3   clopidogrel (PLAVIX) 75 MG tablet TAKE 1 TABLET BY MOUTH ONCE DAILY WITH BREAKFAST . APPOINTMENT REQUIRED FOR FUTURE REFILLS 30 tablet 2   Coenzyme Q10 (COQ-10)  75 MG CAPS Take 75 mg by mouth daily.  0   diclofenac Sodium (VOLTAREN) 1 % GEL Apply 2 g topically Nightly.     escitalopram (LEXAPRO) 20 MG tablet Take 1 tablet by mouth once daily 30 tablet 0   gabapentin (NEURONTIN) 300 MG capsule Take 1 capsule (300 mg total) by mouth 3 (three) times daily. 90 capsule 5   ibuprofen (IBU-200) 200 MG tablet      lansoprazole (PREVACID) 30 MG capsule Take 30 mg by mouth every morning.      LEVEMIR 100 UNIT/ML injection INJECT 90 UNITS SUBCUTANEOUSLY AT BEDTIME 30 mL 0   levothyroxine (EUTHYROX) 25 MCG tablet Take 1 tablet (25 mcg total) by mouth daily before breakfast. 90 tablet 1   lisinopril (ZESTRIL) 10 MG tablet Take 1 tablet by mouth once daily 90 tablet 1   metFORMIN (GLUCOPHAGE-XR) 500 MG 24 hr tablet TAKE 2 TABLETS BY MOUTH TWICE DAILY WITH MEALS 360 tablet 3   methocarbamol (ROBAXIN) 750 MG tablet Take 1 tablet (750 mg total) by mouth 4 (four) times daily. 60 tablet 2   metoprolol succinate (TOPROL-XL) 50 MG 24 hr tablet TAKE 1 TABLET BY MOUTH ONCE DAILY. TAKE WITH OR IMMEDIATELY FOLLOWING A MEAL. 90 tablet 3   nitroGLYCERIN (NITROSTAT) 0.4 MG SL tablet Place 1 tablet (0.4 mg total) under the tongue every 5 (five) minutes as needed. 25 tablet 12   rOPINIRole (REQUIP) 0.5 MG tablet Take 2 tablets (1 mg total) by mouth at bedtime. 180 tablet 0   TRULICITY 4.5 MG/0.5ML SOPN INJECT 4.5 MG SUBCUTANEOUSLY ONCE A WEEK 0.5 mL 0   No current facility-administered medications on file prior to visit.    Review of Systems:  As per HPI- otherwise negative.   Physical Examination: Vitals:   06/05/21 1315 06/05/21 1338  BP: (!) 160/80 140/90  Pulse: 93   Resp: 18   Temp: 98 F (36.7 C)   SpO2: 97%    Vitals:   06/05/21 1315  Weight: 230 lb 6.4 oz (104.5 kg)  Height: 6' (1.829 m)   Body mass index is 31.25 kg/m. Ideal Body Weight: Weight  in (lb) to have BMI = 25: 183.9  GEN: no acute distress.  Mild obesity, looks well HEENT: Atraumatic,  Normocephalic.  Ears and Nose: No external deformity. CV: RRR, No M/G/R. No JVD. No thrill. No extra heart sounds. PULM: CTA B, no wheezes, crackles, rhonchi. No retractions. No resp. distress. No accessory muscle use. ABD: S, NT, ND, +BS. No rebound. No HSM. EXTR: No c/c/e PSYCH: Normally interactive. Conversant.  Foot exam done today  Assessment and Plan: Acquired hypothyroidism - Plan: TSH  Type 2 diabetes mellitus with complication, with long-term current use of insulin (HCC) - Plan: Comprehensive metabolic panel, Hemoglobin A1c  Essential hypertension - Plan: CBC, Comprehensive metabolic panel  Mixed hyperlipidemia - Plan: Lipid panel, atorvastatin (LIPITOR) 40 MG tablet  Screening for prostate cancer - Plan: PSA  Non-ST elevation (NSTEMI) myocardial infarction (HCC) - Plan: isosorbide mononitrate (IMDUR) 30 MG 24 hr tablet  Immunization due - Plan: Pneumococcal conjugate vaccine 20-valent (Prevnar 20)  Patient seen today for follow-up of conditions listed above Will plan further follow- up pending labs. Encouraged him to see his cardiologist as this is due.  I did refill his Imdur today Given pneumonia shot and flu shot He will monitor his home blood pressure and let me know if consistently high, if so we can make an adjustment to his medication  Signed Abbe Amsterdam, MD   Received labs as below 11/1- message to pt. called him but no answer, left message on machine asking him to look at his MyChart messages right away Corrected sodium is 137  Taking Levemir 90 units once daily NovoLog FlexPen 25 to 35 units 3 times daily Trulicity 4.5 mg weekly Potassium is again elevated, he is taking just lisinopril 10 mg Would like to change to lisinopril with hydrochlorothiazide in hopes of better controlling his potassium Results for orders placed or performed in visit on 06/05/21  CBC  Result Value Ref Range   WBC 7.6 4.0 - 10.5 K/uL   RBC 5.01 4.22 - 5.81 Mil/uL    Platelets 273.0 150.0 - 400.0 K/uL   Hemoglobin 15.4 13.0 - 17.0 g/dL   HCT 05.3 97.6 - 73.4 %   MCV 90.1 78.0 - 100.0 fl   MCHC 34.2 30.0 - 36.0 g/dL   RDW 19.3 79.0 - 24.0 %  Comprehensive metabolic panel  Result Value Ref Range   Sodium 128 (L) 135 - 145 mEq/L   Potassium 5.7 (H) 3.5 - 5.1 mEq/L   Chloride 91 (L) 96 - 112 mEq/L   CO2 28 19 - 32 mEq/L   Glucose, Bld 498 (H) 70 - 99 mg/dL   BUN 20 6 - 23 mg/dL   Creatinine, Ser 9.73 0.40 - 1.50 mg/dL   Total Bilirubin 1.5 (H) 0.2 - 1.2 mg/dL   Alkaline Phosphatase 84 39 - 117 U/L   AST 13 0 - 37 U/L   ALT 21 0 - 53 U/L   Total Protein 7.3 6.0 - 8.3 g/dL   Albumin 4.6 3.5 - 5.2 g/dL   GFR 53.29 >92.42 mL/min   Calcium 9.9 8.4 - 10.5 mg/dL  Hemoglobin A8T  Result Value Ref Range   Hgb A1c MFr Bld 12.2 (H) 4.6 - 6.5 %  Lipid panel  Result Value Ref Range   Cholesterol 191 0 - 200 mg/dL   Triglycerides (H) 0.0 - 149.0 mg/dL    4196.2 Triglyceride is over 400; calculations on Lipids are invalid.   HDL 33.00 (L) >39.00 mg/dL   Total CHOL/HDL  Ratio 6   PSA  Result Value Ref Range   PSA 0.55 0.10 - 4.00 ng/mL  TSH  Result Value Ref Range   TSH 2.35 0.35 - 5.50 uIU/mL  LDL cholesterol, direct  Result Value Ref Range   Direct LDL 55.0 mg/dL

## 2021-06-05 ENCOUNTER — Encounter: Payer: Self-pay | Admitting: Family Medicine

## 2021-06-05 ENCOUNTER — Other Ambulatory Visit: Payer: Self-pay

## 2021-06-05 ENCOUNTER — Ambulatory Visit (INDEPENDENT_AMBULATORY_CARE_PROVIDER_SITE_OTHER): Payer: Medicare HMO | Admitting: Family Medicine

## 2021-06-05 VITALS — BP 140/90 | HR 93 | Temp 98.0°F | Resp 18 | Ht 72.0 in | Wt 230.4 lb

## 2021-06-05 DIAGNOSIS — I10 Essential (primary) hypertension: Secondary | ICD-10-CM

## 2021-06-05 DIAGNOSIS — Z125 Encounter for screening for malignant neoplasm of prostate: Secondary | ICD-10-CM | POA: Diagnosis not present

## 2021-06-05 DIAGNOSIS — E039 Hypothyroidism, unspecified: Secondary | ICD-10-CM | POA: Diagnosis not present

## 2021-06-05 DIAGNOSIS — E118 Type 2 diabetes mellitus with unspecified complications: Secondary | ICD-10-CM

## 2021-06-05 DIAGNOSIS — E782 Mixed hyperlipidemia: Secondary | ICD-10-CM | POA: Diagnosis not present

## 2021-06-05 DIAGNOSIS — Z23 Encounter for immunization: Secondary | ICD-10-CM | POA: Diagnosis not present

## 2021-06-05 DIAGNOSIS — I214 Non-ST elevation (NSTEMI) myocardial infarction: Secondary | ICD-10-CM

## 2021-06-05 DIAGNOSIS — Z794 Long term (current) use of insulin: Secondary | ICD-10-CM

## 2021-06-05 MED ORDER — ATORVASTATIN CALCIUM 40 MG PO TABS: 40.0000 mg | ORAL_TABLET | Freq: Every day | ORAL | 3 refills | Status: DC

## 2021-06-05 MED ORDER — NOVOLOG FLEXPEN 100 UNIT/ML ~~LOC~~ SOPN: PEN_INJECTOR | SUBCUTANEOUS | 5 refills | Status: DC

## 2021-06-05 MED ORDER — ISOSORBIDE MONONITRATE ER 30 MG PO TB24: 30.0000 mg | ORAL_TABLET | Freq: Every day | ORAL | 3 refills | Status: DC

## 2021-06-06 ENCOUNTER — Encounter: Payer: Self-pay | Admitting: Family Medicine

## 2021-06-06 DIAGNOSIS — E875 Hyperkalemia: Secondary | ICD-10-CM

## 2021-06-06 DIAGNOSIS — I10 Essential (primary) hypertension: Secondary | ICD-10-CM

## 2021-06-06 LAB — CBC
HCT: 45.1 % (ref 39.0–52.0)
Hemoglobin: 15.4 g/dL (ref 13.0–17.0)
MCHC: 34.2 g/dL (ref 30.0–36.0)
MCV: 90.1 fl (ref 78.0–100.0)
Platelets: 273 10*3/uL (ref 150.0–400.0)
RBC: 5.01 Mil/uL (ref 4.22–5.81)
RDW: 13.3 % (ref 11.5–15.5)
WBC: 7.6 10*3/uL (ref 4.0–10.5)

## 2021-06-06 LAB — COMPREHENSIVE METABOLIC PANEL
ALT: 21 U/L (ref 0–53)
AST: 13 U/L (ref 0–37)
Albumin: 4.6 g/dL (ref 3.5–5.2)
Alkaline Phosphatase: 84 U/L (ref 39–117)
BUN: 20 mg/dL (ref 6–23)
CO2: 28 mEq/L (ref 19–32)
Calcium: 9.9 mg/dL (ref 8.4–10.5)
Chloride: 91 mEq/L — ABNORMAL LOW (ref 96–112)
Creatinine, Ser: 0.98 mg/dL (ref 0.40–1.50)
GFR: 84.17 mL/min (ref >60.00)
Glucose, Bld: 498 mg/dL — ABNORMAL HIGH (ref 70–99)
Potassium: 5.7 mEq/L — ABNORMAL HIGH (ref 3.5–5.1)
Sodium: 128 mEq/L — ABNORMAL LOW (ref 135–145)
Total Bilirubin: 1.5 mg/dL — ABNORMAL HIGH (ref 0.2–1.2)
Total Protein: 7.3 g/dL (ref 6.0–8.3)

## 2021-06-06 LAB — LIPID PANEL
Cholesterol: 191 mg/dL (ref 0–200)
HDL: 33 mg/dL — ABNORMAL LOW (ref >39.00)
Total CHOL/HDL Ratio: 6
Triglycerides: 1241 mg/dL — ABNORMAL HIGH (ref 0.0–149.0)

## 2021-06-06 LAB — LDL CHOLESTEROL, DIRECT: Direct LDL: 55 mg/dL

## 2021-06-06 LAB — PSA: PSA: 0.55 ng/mL (ref 0.10–4.00)

## 2021-06-06 LAB — HEMOGLOBIN A1C: Hgb A1c MFr Bld: 12.2 % — ABNORMAL HIGH (ref 4.6–6.5)

## 2021-06-06 LAB — TSH: TSH: 2.35 u[IU]/mL (ref 0.35–5.50)

## 2021-06-06 MED ORDER — LISINOPRIL-HYDROCHLOROTHIAZIDE 10-12.5 MG PO TABS: 1.0000 | ORAL_TABLET | Freq: Every day | ORAL | 3 refills | Status: DC

## 2021-06-06 MED ORDER — TRULICITY 4.5 MG/0.5ML ~~LOC~~ SOAJ: SUBCUTANEOUS | 3 refills | Status: DC

## 2021-06-06 MED ORDER — INSULIN DETEMIR 100 UNIT/ML ~~LOC~~ SOLN: SUBCUTANEOUS | 6 refills | Status: DC

## 2021-06-07 ENCOUNTER — Ambulatory Visit (HOSPITAL_COMMUNITY)
Admission: RE | Admit: 2021-06-07 | Discharge: 2021-06-07 | Disposition: A | Discharge status: Home / Self Care | Payer: Medicare HMO | Source: Ambulatory Visit | Attending: Orthopedic Surgery | Admitting: Orthopedic Surgery

## 2021-06-07 ENCOUNTER — Other Ambulatory Visit: Payer: Self-pay

## 2021-06-07 ENCOUNTER — Encounter (HOSPITAL_COMMUNITY): Payer: Self-pay

## 2021-06-07 DIAGNOSIS — M25851 Other specified joint disorders, right hip: Secondary | ICD-10-CM | POA: Insufficient documentation

## 2021-06-07 DIAGNOSIS — S73191D Other sprain of right hip, subsequent encounter: Secondary | ICD-10-CM | POA: Diagnosis present

## 2021-06-07 DIAGNOSIS — M47816 Spondylosis without myelopathy or radiculopathy, lumbar region: Secondary | ICD-10-CM | POA: Diagnosis not present

## 2021-06-07 DIAGNOSIS — M5126 Other intervertebral disc displacement, lumbar region: Secondary | ICD-10-CM | POA: Diagnosis present

## 2021-06-07 DIAGNOSIS — S73191A Other sprain of right hip, initial encounter: Secondary | ICD-10-CM | POA: Diagnosis not present

## 2021-06-07 DIAGNOSIS — M25451 Effusion, right hip: Secondary | ICD-10-CM | POA: Diagnosis not present

## 2021-06-07 DIAGNOSIS — M5410 Radiculopathy, site unspecified: Secondary | ICD-10-CM | POA: Diagnosis present

## 2021-06-07 DIAGNOSIS — M25859 Other specified joint disorders, unspecified hip: Secondary | ICD-10-CM

## 2021-06-07 DIAGNOSIS — M1611 Unilateral primary osteoarthritis, right hip: Secondary | ICD-10-CM | POA: Diagnosis not present

## 2021-06-07 MED ORDER — LIDOCAINE HCL (PF) 1 % IJ SOLN
INTRAMUSCULAR | Status: AC
  Administered 2021-06-07: 4 mL
  Filled 2021-06-07: qty 5

## 2021-06-07 MED ORDER — SODIUM CHLORIDE (PF) 0.9 % IJ SOLN
INTRAMUSCULAR | Status: AC
  Administered 2021-06-07: 5 mL
  Filled 2021-06-07: qty 10

## 2021-06-07 MED ORDER — GADOBUTROL 1 MMOL/ML IV SOLN
2.0000 mL | Freq: Once | INTRAVENOUS | Status: AC | PRN
  Administered 2021-06-07: 0.05 mL

## 2021-06-07 MED ORDER — POVIDONE-IODINE 10 % EX SOLN
CUTANEOUS | Status: AC
  Administered 2021-06-07: 1
  Filled 2021-06-07: qty 15

## 2021-06-07 MED ORDER — IOHEXOL 180 MG/ML  SOLN
20.0000 mL | Freq: Once | INTRAMUSCULAR | Status: AC | PRN
  Administered 2021-06-07: 15 mL via INTRA_ARTICULAR

## 2021-06-07 NOTE — Procedures (Signed)
Preprocedure Dx: Femoral acetabular impingement RIGHT hip Postprocedure Dx: Femoral acetabular impingement RIGHT hip Procedure  Fluoroscopically guided RIGHT joint injection for MR arthrogrpahy Radiologist:  Tyron Russell Anesthesia:  4 ml of 1% lidocaine Injectate:  10 ml of (15 ml Omni-180, m5 ml sterile saline, 0.05 ml Gadovist) Fluoro time:  0 minutes 30 seconds EBL:   None Complications: None

## 2021-06-12 ENCOUNTER — Ambulatory Visit: Payer: Medicare HMO | Admitting: Orthopedic Surgery

## 2021-06-12 ENCOUNTER — Other Ambulatory Visit: Payer: Self-pay

## 2021-06-12 DIAGNOSIS — M1611 Unilateral primary osteoarthritis, right hip: Secondary | ICD-10-CM

## 2021-06-12 MED ORDER — HYDROCODONE-ACETAMINOPHEN 5-325 MG PO TABS: 1.0000 | ORAL_TABLET | Freq: Four times a day (QID) | ORAL | 0 refills | Status: DC | PRN

## 2021-06-12 NOTE — Patient Instructions (Signed)
Driving Directions to [facility] from [employer] address is [address] [place] The phone number is [phone] Dr Doneen Poisson   1. Start out going [place] on [address] toward [address].  Then 0.02 miles0.02 total miles 2. Take the 1st right onto [address]. Continue to follow [address].  If you reach [facility] you've gone a little too far  Then 0.58 miles0.60 total miles 3. Turn right onto [address].  [place] is just past [address]  Then 2.25 miles2.85 total miles 4. Take the [address] ramp toward [place].  Then 0.25 miles3.10 total miles 5. Merge onto [address]  Then 18.17 miles21.28 total miles 6. Merge onto [address]  Then 1.47 miles22.74 total miles 7. Turn right onto [place].  [address] is just past [address]  Then 0.11 miles22.85 total miles  8. [address] [place], [place] [address], [phone] [address] is on the left.

## 2021-06-12 NOTE — Progress Notes (Signed)
Chief Complaint  Patient presents with   Results    MRI hip RT and lumbar spine   60 year old male pain right hip.  Difficult to distinguish between hip symptoms and lumbar spine symptoms so we got an MRI of both  The MRI reports are as follows  HIP  IMPRESSION: Moderate right hip osteoarthritis with degenerative anterior superior labral tearing. 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     Electronically Signed   By: Caprice Renshaw M.D.   On: 06/08/2021 10:18  LUMBAR  T12-L1: No spinal canal or neural foraminal stenosis.   L1-2: Mild facet degenerative changes. No significant spinal canal or neural foraminal stenosis.   L2-3: Shallow disc bulge and mild facet degenerative changes without significant spinal canal or neural foraminal stenosis.   L3-4: Disc bulge, mildly asymmetric to the left with superimposed small central disc protrusion mild facet degenerative changes resulting in mild left neural foraminal narrowing. No significant spinal canal stenosis.   L4-5: Disc bulge and mild facet degenerative changes resulting in mild bilateral neural foraminal narrowing.   L5-S1: Small left central/subarticular disc protrusion and mild facet degenerative changes. No spinal canal or neural foraminal stenosis.   IMPRESSION: 1. Mild facet degenerative changes with mild left neural foraminal narrowing at L3-4 and bilaterally at L4-5. 2. No high-grade spinal canal stenosis.     Electronically Signed   By: Baldemar Lenis M.D.   On: 06/07/2021 14:51  I THINK HE SHOULD CONSIDER THA   EVEN THOUGH THE PLAIN FILMS LOOK OK ITO JOINT SPACE THE MRI SHOWS MORE SEVERE DISEASE   I discussed this with Leonette Most and he says he has a lot of pain even on the Tylenol with codeine  He is not sleeping at night he is limping his ADLs are very difficult I am going to make the referral to Dr. Magnus Ivan for direct anterior total  hip left hip

## 2021-06-19 ENCOUNTER — Encounter: Payer: Self-pay | Admitting: Cardiology

## 2021-06-19 ENCOUNTER — Other Ambulatory Visit: Payer: Self-pay

## 2021-06-19 ENCOUNTER — Ambulatory Visit (INDEPENDENT_AMBULATORY_CARE_PROVIDER_SITE_OTHER): Payer: Medicare HMO | Admitting: Cardiology

## 2021-06-19 VITALS — BP 122/82 | HR 83 | Ht 72.0 in | Wt 227.0 lb

## 2021-06-19 DIAGNOSIS — E119 Type 2 diabetes mellitus without complications: Secondary | ICD-10-CM

## 2021-06-19 DIAGNOSIS — I10 Essential (primary) hypertension: Secondary | ICD-10-CM | POA: Diagnosis not present

## 2021-06-19 DIAGNOSIS — E782 Mixed hyperlipidemia: Secondary | ICD-10-CM

## 2021-06-19 DIAGNOSIS — Z01818 Encounter for other preprocedural examination: Secondary | ICD-10-CM

## 2021-06-19 DIAGNOSIS — Z01810 Encounter for preprocedural cardiovascular examination: Secondary | ICD-10-CM | POA: Diagnosis not present

## 2021-06-19 DIAGNOSIS — I2510 Atherosclerotic heart disease of native coronary artery without angina pectoris: Secondary | ICD-10-CM

## 2021-06-19 DIAGNOSIS — I214 Non-ST elevation (NSTEMI) myocardial infarction: Secondary | ICD-10-CM

## 2021-06-19 DIAGNOSIS — G459 Transient cerebral ischemic attack, unspecified: Secondary | ICD-10-CM | POA: Diagnosis not present

## 2021-06-19 MED ORDER — ASPIRIN 81 MG PO TBEC: 81.0000 mg | DELAYED_RELEASE_TABLET | Freq: Every day | ORAL | 3 refills | Status: DC

## 2021-06-19 MED ORDER — LISINOPRIL-HYDROCHLOROTHIAZIDE 10-12.5 MG PO TABS: 1.0000 | ORAL_TABLET | Freq: Every day | ORAL | 3 refills | Status: DC

## 2021-06-19 MED ORDER — ATORVASTATIN CALCIUM 40 MG PO TABS: 40.0000 mg | ORAL_TABLET | Freq: Every day | ORAL | 3 refills | Status: DC

## 2021-06-19 MED ORDER — NITROGLYCERIN 0.4 MG SL SUBL: 0.4000 mg | SUBLINGUAL_TABLET | SUBLINGUAL | 12 refills | Status: DC | PRN

## 2021-06-19 MED ORDER — ISOSORBIDE MONONITRATE ER 30 MG PO TB24: 30.0000 mg | ORAL_TABLET | Freq: Every day | ORAL | 3 refills | Status: DC

## 2021-06-19 MED ORDER — CLOPIDOGREL BISULFATE 75 MG PO TABS
ORAL_TABLET | ORAL | 3 refills | Status: DC
Start: 2021-06-19 — End: 2022-07-23

## 2021-06-19 MED ORDER — METOPROLOL SUCCINATE ER 50 MG PO TB24: ORAL_TABLET | ORAL | 3 refills | Status: DC

## 2021-06-19 NOTE — Progress Notes (Signed)
Cardiology Office Note:    Date:  06/19/2021   ID:  Sean Horn, DOB September 21, 1960, MRN [id number]  PCP:  Pearline Cables, MD  Cardiologist:  Thomasene Ripple, DO  Electrophysiologist:  None   Referring MD: Pearline Cables, MD   " I am doing well"  History of Present Illness:    Sean Horn is a 60 y.o. male with a hx of hypertension, NSTEMI/coronary artery disease status post left heart cath June 26, 2019 with 80% apical LAD lesions which has been stated for medical management due to this lesion being a unsuitable candidate for PCI, NSVT seen on monitor, SVT/AVNRT status post ablation on July 23, 2019.   I did see the patient on March 23, 2020 at that time he reported that he had a syncope episode.  Given this we placed a monitor and the patient.  He did wear the monitor the first 1 for 1 day and 3 hours which showed nonsustained ventricular tachycardia, because this fell out we will send the patient another monitor which he wore for longer duration and it was unremarkable.  I last saw the patient October 2021 at that time he has been shortness of breath I repeated an echocardiogram.  I also increased his beta-blocker given his notable episodes of nonsustained ventricular tachycardia on his ZIO monitor.  Is a former patient he had his echocardiogram and it was normal.  But he tells me he has been doing well from a cardiovascular standpoint.  He has had recent issues with his right hip.  He is planning hip surgery.  He denies any shortness of breath, chest pain.  Past Medical History:  Diagnosis Date   Anxiety    CAD in native artery    a. cath 06/26/2019 - 80% apical LAD >> medical management    Chronic headaches    Chronic neck pain    Coronary artery disease involving native coronary artery of native heart without angina pectoris 11/06/2019   GERD (gastroesophageal reflux disease)    occasional - OTC as needed   History of chicken pox    History of kidney stones     History of shingles    Hypertension    under control with med., had been on med. since age 80   Insomnia 09/13/2016   Insulin dependent diabetes mellitus    Follows with endocrinology, Dr Chestine Spore reports his last hgba1c was 7.3    Non-insulin dependent type 2 diabetes mellitus (HCC)    Non-ST elevation (NSTEMI) myocardial infarction (HCC) 06/26/2019   Obesity 01/30/2017   Occipital neuralgia    Osteoarthritis    right Horn   PONV (postoperative nausea and vomiting)    PONV (postoperative nausea and vomiting)    Preventative health care 08/14/2013   Right Horn DJD 12/23/2012   And left now    SVT (supraventricular tachycardia) (HCC) 06/26/2019   Tachycardia    TIA (transient ischemic attack) 11/03/2018   Type 2 diabetes mellitus with complication, with long-term current use of insulin (HCC)    Follows with endocrinology, Dr Chestine Spore reports his last hgba1c was 7.3    Urinary frequency 12/11/2016    Past Surgical History:  Procedure Laterality Date   ANTERIOR CERVICAL DECOMP/DISCECTOMY FUSION N/A 01/16/2018   Procedure: ANTERIOR CERVICAL DECOMPRESSION/DISCECTOMY FUSION CERVICAL FOUR-FIVE, CERVICAL FIVE-SIX;  Surgeon: Lisbeth Renshaw, MD;  Location: [facility] OR;  Service: Neurosurgery;  Laterality: N/A;  ANTERIOR CERVICAL DECOMPRESSION/DISCECTOMY FUSION CERVICAL FOUR-FIVE, CERVICAL FIVE-SIX   CHOLECYSTECTOMY  1980's  CYSTOSCOPY/RETROGRADE/URETEROSCOPY/STONE EXTRACTION WITH BASKET Right 08/27/2003   and double J stent placement   ELECTROPHYSIOLOGY STUDY N/A 07/23/2019   Procedure: ELECTROPHYSIOLOGY STUDY;  Surgeon: Marinus Maw, MD;  [facility] INVASIVE CV LAB;  Service: Cardiovascular;  Laterality: N/A;   INGUINAL HERNIA REPAIR Left 1990's   Horn ARTHROSCOPY Left 1980's   Horn ARTHROSCOPY Right 2013   Horn ARTHROSCOPY Right 06/30/2013   Procedure: RIGHT Horn ARTHROSCOPY AND SYNOVECTOMY;  Surgeon: Velna Ochs, MD;  Location: [facility];  Service: Orthopedics;  Laterality:  Right;   LEFT HEART CATH AND CORONARY ANGIOGRAPHY N/A 06/26/2019   Procedure: LEFT HEART CATH AND CORONARY ANGIOGRAPHY;  Surgeon: Marykay Lex, MD;  Location: [facility] INVASIVE CV LAB;  Service: Cardiovascular;  Laterality: N/A;   LITHOTRIPSY     multiple, b/l   SHOULDER ADHESION RELEASE Left ` 1998   SHOULDER ARTHROSCOPY W/ ROTATOR CUFF REPAIR Left 10/20/2004   x 4, had to have part of clavile removed. torn rotator cuff multiple times, had to place screws    SHOULDER ARTHROSCOPY W/ SUPERIOR LABRAL ANTERIOR POSTERIOR LESION REPAIR Left 12/22/2004   SVT ABLATION N/A 07/23/2019   Procedure: SVT ABLATION;  Surgeon: Marinus Maw, MD;  Location: [facility] INVASIVE CV LAB;  Service: Cardiovascular;  Laterality: N/A;   TOTAL Horn ARTHROPLASTY Right 12/23/2012   x3, 2 arthroscopies and a TKR,  TOTAL Horn ARTHROPLASTY;  Surgeon: Velna Ochs, MD;  Location: [facility] OR;  Service: Orthopedics;  Laterality: Right;  DEPUY, RNFA   WRIST SURGERY Right    x 3, torn ligaments, pins placed then infected, then fused with plate    Current Medications: Current Meds  Medication Sig   acetaminophen (TYLENOL) 500 MG tablet Take 500 mg by mouth every 6 (six) hours as needed.   acetaminophen-codeine (TYLENOL #3) 300-30 MG tablet Take 1 tablet by mouth every 6 (six) hours as needed for moderate pain.   aspirin EC 81 MG EC tablet Take 1 tablet (81 mg total) by mouth daily.   atorvastatin (LIPITOR) 40 MG tablet Take 1 tablet (40 mg total) by mouth daily.   clopidogrel (PLAVIX) 75 MG tablet TAKE 1 TABLET BY MOUTH ONCE DAILY WITH BREAKFAST . APPOINTMENT REQUIRED FOR FUTURE REFILLS   Coenzyme Q10 (COQ-10) 75 MG CAPS Take 75 mg by mouth daily.   diclofenac Sodium (VOLTAREN) 1 % GEL Apply 2 g topically Nightly.   Dulaglutide (TRULICITY) 4.5 MG/0.5ML SOPN INJECT 4.5 MG SUBCUTANEOUSLY ONCE A WEEK   escitalopram (LEXAPRO) 20 MG tablet Take 1 tablet by mouth once daily   gabapentin (NEURONTIN) 300 MG capsule Take 1 capsule (300 mg  total) by mouth 3 (three) times daily.   HYDROcodone-acetaminophen (NORCO/VICODIN) 5-325 MG tablet Take 1 tablet by mouth every 6 (six) hours as needed for moderate pain.   ibuprofen (ADVIL) 200 MG tablet    insulin aspart (NOVOLOG FLEXPEN) 100 UNIT/ML FlexPen Inject 25-35 units 3 times daily with meals   insulin detemir (LEVEMIR) 100 UNIT/ML injection INJECT 90 UNITS SUBCUTANEOUSLY AT BEDTIME   isosorbide mononitrate (IMDUR) 30 MG 24 hr tablet Take 1 tablet (30 mg total) by mouth daily. Needs appointment for further refills   lansoprazole (PREVACID) 30 MG capsule Take 30 mg by mouth every morning.    levothyroxine (EUTHYROX) 25 MCG tablet Take 1 tablet (25 mcg total) by mouth daily before breakfast.   lisinopril-hydrochlorothiazide (ZESTORETIC) 10-12.5 MG tablet Take 1 tablet by mouth daily.   metFORMIN (GLUCOPHAGE-XR) 500 MG 24 hr tablet TAKE 2  TABLETS BY MOUTH TWICE DAILY WITH MEALS   methocarbamol (ROBAXIN) 750 MG tablet Take 1 tablet (750 mg total) by mouth 4 (four) times daily.   metoprolol succinate (TOPROL-XL) 50 MG 24 hr tablet TAKE 1 TABLET BY MOUTH ONCE DAILY. TAKE WITH OR IMMEDIATELY FOLLOWING A MEAL.   nitroGLYCERIN (NITROSTAT) 0.4 MG SL tablet Place 1 tablet (0.4 mg total) under the tongue every 5 (five) minutes as needed.   rOPINIRole (REQUIP) 0.5 MG tablet Take 2 tablets (1 mg total) by mouth at bedtime.     Allergies:   Rosuvastatin and Toradol [ketorolac tromethamine]   Social History   Socioeconomic History   Marital status: Single    Spouse name: Not on file   Number of children: 2   Years of education: Not on file   Highest education level: High school graduate  Occupational History   Occupation: Disabled  Tobacco Use   Smoking status: Never   Smokeless tobacco: Current    Types: Snuff  Vaping Use   Vaping Use: Never used  Substance and Sexual Activity   Alcohol use: No   Drug use: No   Sexual activity: Yes  Other Topics Concern   Not on file  Social  History Narrative   Lives at home with his father (he takes care of his father)   Right handed   Drinks 2 cups of caffeine daily   Social Determinants of Health   Financial Resource Strain: Not on file  Food Insecurity: Not on file  Transportation Needs: Not on file  Physical Activity: Not on file  Stress: Not on file  Social Connections: Not on file     Family History: The patient's family history includes Arthritis in his paternal grandmother; COPD in his mother; Diabetes in his father; Heart disease in his father, maternal grandfather, and mother; Hypertension in his father, mother, and son; Peripheral vascular disease in his father; Stroke in his father. There is no history of Colon cancer, Rectal cancer, or Stomach cancer.  ROS:   Review of Systems  Constitution: Negative for decreased appetite, fever and weight gain.  HENT: Negative for congestion, ear discharge, hoarse voice and sore throat.   Eyes: Negative for discharge, redness, vision loss in right eye and visual halos.  Cardiovascular: Negative for chest pain, dyspnea on exertion, leg swelling, orthopnea and palpitations.  Respiratory: Negative for cough, hemoptysis, shortness of breath and snoring.   Endocrine: Negative for heat intolerance and polyphagia.  Hematologic/Lymphatic: Negative for bleeding problem. Does not bruise/bleed easily.  Skin: Negative for flushing, nail changes, rash and suspicious lesions.  Musculoskeletal: Negative for arthritis, joint pain, muscle cramps, myalgias, neck pain and stiffness.  Gastrointestinal: Negative for abdominal pain, bowel incontinence, diarrhea and excessive appetite.  Genitourinary: Negative for decreased libido, genital sores and incomplete emptying.  Neurological: Negative for brief paralysis, focal weakness, headaches and loss of balance.  Psychiatric/Behavioral: Negative for altered mental status, depression and suicidal ideas.  Allergic/Immunologic: Negative for HIV  exposure and persistent infections.    EKGs/Labs/Other Studies Reviewed:    The following studies were reviewed today:   EKG: None today   Echo May 12, 2020 IMPRESSIONS   1. Left ventricular ejection fraction, by estimation, is 60 to 65%. The  left ventricle has normal function. The left ventricle has no regional  wall motion abnormalities. There is moderate left ventricular hypertrophy.  Left ventricular diastolic  parameters are consistent with Grade I diastolic dysfunction (impaired  relaxation).   2. Right ventricular systolic function  is normal. The right ventricular  size is normal. There is normal pulmonary artery systolic pressure.   3. The mitral valve is normal in structure. No evidence of mitral valve  regurgitation. No evidence of mitral stenosis.   4. The aortic valve is tricuspid. Aortic valve regurgitation is not  visualized. No aortic stenosis is present.   5. The inferior vena cava is normal in size with greater than 50%  respiratory variability, suggesting right atrial pressure of 3 mmHg.   FINDINGS   Left Ventricle: Left ventricular ejection fraction, by estimation, is 60  to 65%. The left ventricle has normal function. The left ventricle has no  regional wall motion abnormalities. The left ventricular internal cavity  size was normal in size. There is   moderate left ventricular hypertrophy. Left ventricular diastolic  parameters are consistent with Grade I diastolic dysfunction (impaired  relaxation). Normal left ventricular filling pressure.   Right Ventricle: The right ventricular size is normal. No increase in  right ventricular wall thickness. Right ventricular systolic function is  normal. There is normal pulmonary artery systolic pressure. The tricuspid  regurgitant velocity is 2.05 m/s, and   with an assumed right atrial pressure of 3 mmHg, the estimated right  ventricular systolic pressure is 19.8 mmHg.   Left Atrium: Left atrial size was  normal in size.   Right Atrium: Right atrial size was normal in size.   Pericardium: There is no evidence of pericardial effusion.   Mitral Valve: The mitral valve is normal in structure. No evidence of  mitral valve regurgitation. No evidence of mitral valve stenosis.   Tricuspid Valve: The tricuspid valve is normal in structure. Tricuspid  valve regurgitation is trivial. No evidence of tricuspid stenosis.   Aortic Valve: The aortic valve is tricuspid. Aortic valve regurgitation is  not visualized. No aortic stenosis is present. Aortic valve mean gradient  measures 1.9 mmHg. Aortic valve peak gradient measures 3.3 mmHg. Aortic  valve area, by VTI measures 3.56  cm.   Pulmonic Valve: The pulmonic valve was not well visualized. Pulmonic valve  regurgitation is not visualized. No evidence of pulmonic stenosis.   Aorta: The aortic root is normal in size and structure.   Pulmonary Artery: Indeterminant PASP, inadequate TR jet.   Venous: The inferior vena cava is normal in size with greater than 50%  respiratory variability, suggesting right atrial pressure of 3 mmHg.   IAS/Shunts: No atrial level shunt detected by color flow Doppler.   Recent Labs: 06/05/2021: ALT 21; BUN 20; Creatinine, Ser 0.98; Hemoglobin 15.4; Platelets 273.0; Potassium 5.7; Sodium 128; TSH 2.35  Recent Lipid Panel    Component Value Date/Time   CHOL 191 06/05/2021 1352   TRIG (H) 06/05/2021 1352    1241.0 Triglyceride is over 400; calculations on Lipids are invalid.   HDL 33.00 (L) 06/05/2021 1352   CHOLHDL 6 06/05/2021 1352   VLDL 51 (H) 06/26/2019 0409   LDLCALC 68 04/21/2020 1448   LDLDIRECT 55.0 06/05/2021 1352    Physical Exam:    VS:  BP 122/82 (BP Location: Left Arm)   Pulse 83   Ht 6' (1.829 m)   Wt 227 lb (103 kg)   SpO2 98%   BMI 30.79 kg/m     Wt Readings from Last 3 Encounters:  06/19/21 227 lb (103 kg)  06/05/21 230 lb 6.4 oz (104.5 kg)  05/18/21 225 lb (102.1 kg)     GEN:  Well nourished, well developed in no acute distress HEENT:  Normal NECK: No JVD; No carotid bruits LYMPHATICS: No lymphadenopathy CARDIAC: S1S2 noted,RRR, no murmurs, rubs, gallops RESPIRATORY:  Clear to auscultation without rales, wheezing or rhonchi  ABDOMEN: Soft, non-tender, non-distended, +bowel sounds, no guarding. EXTREMITIES: No edema, No cyanosis, no clubbing MUSCULOSKELETAL:  No deformity  SKIN: Warm and dry NEUROLOGIC:  Alert and oriented x 3, non-focal PSYCHIATRIC:  Normal affect, good insight  ASSESSMENT:    1. Coronary artery disease involving native coronary artery of native heart, unspecified whether angina present   2. Non-insulin dependent type 2 diabetes mellitus (HCC)   3. Primary hypertension   4. TIA (transient ischemic attack)   5. Coronary artery disease involving native coronary artery of native heart without angina pectoris   6. Mixed hyperlipidemia    PLAN:    He appears to be doing well from a cardiovascular standpoint.  No medication changes will be made today.  Consider refills of his cardiac vascular medications.  The patient does not have any unstable cardiac conditions.  Upon evaluation today, he can achieve 4 METs or greater without anginal symptoms.  According to [facility] and AHA guidelines, he requires no further cardiac workup prior to his noncardiac surgery and should be at acceptable risk.    Hyperlipidemia - continue with current statin medication.  Diabetes mellitus is being managed by his primary care provider.  The patient is in agreement with the above plan. The patient left the office in stable condition.  The patient will follow up in 1 year or sooner if needed.   Medication Adjustments/Labs and Tests Ordered: Current medicines are reviewed at length with the patient today.  Concerns regarding medicines are outlined above.  No orders of the defined types were placed in this encounter.  No orders of the defined types were placed in this  encounter.   Patient Instructions  Medication Instructions:  Your physician recommends that you continue on your current medications as directed. Please refer to the Current Medication list given to you today.  *If you need a refill on your cardiac medications before your next appointment, please call your pharmacy*   Lab Work: None If you have labs (blood work) drawn today and your tests are completely normal, you will receive your results only by: MyChart Message (if you have MyChart) OR A paper copy in the mail If you have any lab test that is abnormal or we need to change your treatment, we will call you to review the results.   Testing/Procedures: None   Follow-Up: At [facility], you and your health needs are our priority.  As part of our continuing mission to provide you with exceptional heart care, we have created designated Provider Care Teams.  These Care Teams include your primary Cardiologist (physician) and Advanced Practice Providers (APPs -  Physician Assistants and Nurse Practitioners) who all work together to provide you with the care you need, when you need it.  We recommend signing up for the patient portal called "MyChart".  Sign up information is provided on this After Visit Summary.  MyChart is used to connect with patients for Virtual Visits (Telemedicine).  Patients are able to view lab/test results, encounter notes, upcoming appointments, etc.  Non-urgent messages can be sent to your provider as well.   To learn more about what you can do with MyChart, go to [email or web address].    Your next appointment:   1 year(s)  The format for your next appointment:   In Person  Provider:   Thomasene Ripple, DO  Other Instructions   Adopting a Healthy Lifestyle.  Know what a healthy weight is for you (roughly BMI <25) and aim to maintain this   Aim for 7+ servings of fruits and vegetables daily   65-80+ fluid ounces of water or unsweet tea for healthy  kidneys   Limit to max 1 drink of alcohol per day; avoid smoking/tobacco   Limit animal fats in diet for cholesterol and heart health - choose grass fed whenever available   Avoid highly processed foods, and foods high in saturated/trans fats   Aim for low stress - take time to unwind and care for your mental health   Aim for 150 min of moderate intensity exercise weekly for heart health, and weights twice weekly for bone health   Aim for 7-9 hours of sleep daily   When it comes to diets, agreement about the perfect plan isnt easy to find, even among the experts. Experts at the [facility] of [employer] developed an idea known as the Healthy Eating Plate. Just imagine a plate divided into logical, healthy portions.   The emphasis is on diet quality:   Load up on vegetables and fruits - one-half of your plate: Aim for color and variety, and remember that potatoes dont count.   Go for whole grains - one-quarter of your plate: Whole wheat, barley, wheat berries, quinoa, oats, brown rice, and foods made with them. If you want pasta, go with whole wheat pasta.   Protein power - one-quarter of your plate: Fish, chicken, beans, and nuts are all healthy, versatile protein sources. Limit red meat.   The diet, however, does go beyond the plate, offering a few other suggestions.   Use healthy plant oils, such as olive, canola, soy, corn, sunflower and peanut. Check the labels, and avoid partially hydrogenated oil, which have unhealthy trans fats.   If youre thirsty, drink water. Coffee and tea are good in moderation, but skip sugary drinks and limit milk and dairy products to one or two daily servings.   The type of carbohydrate in the diet is more important than the amount. Some sources of carbohydrates, such as vegetables, fruits, whole grains, and beans-are healthier than others.   Finally, stay active  Signed, Thomasene Ripple, DO  06/19/2021 8:14 AM    [facility]

## 2021-06-19 NOTE — Patient Instructions (Addendum)
Medication Instructions:  Your physician recommends that you continue on your current medications as directed. Please refer to the Current Medication list given to you today.  Refilled cardiac medications.  *If you need a refill on your cardiac medications before your next appointment, please call your pharmacy*   Lab Work: None If you have labs (blood work) drawn today and your tests are completely normal, you will receive your results only by: MyChart Message (if you have MyChart) OR A paper copy in the mail If you have any lab test that is abnormal or we need to change your treatment, we will call you to review the results.   Testing/Procedures: None   Follow-Up: At [facility], you and your health needs are our priority.  As part of our continuing mission to provide you with exceptional heart care, we have created designated Provider Care Teams.  These Care Teams include your primary Cardiologist (physician) and Advanced Practice Providers (APPs -  Physician Assistants and Nurse Practitioners) who all work together to provide you with the care you need, when you need it.  We recommend signing up for the patient portal called "MyChart".  Sign up information is provided on this After Visit Summary.  MyChart is used to connect with patients for Virtual Visits (Telemedicine).  Patients are able to view lab/test results, encounter notes, upcoming appointments, etc.  Non-urgent messages can be sent to your provider as well.   To learn more about what you can do with MyChart, go to [email or web address].    Your next appointment:   1 year(s)  The format for your next appointment:   In Person  Provider:   Thomasene Ripple, DO     Other Instructions

## 2021-06-26 ENCOUNTER — Ambulatory Visit (INDEPENDENT_AMBULATORY_CARE_PROVIDER_SITE_OTHER): Payer: Medicare HMO | Admitting: Orthopaedic Surgery

## 2021-06-26 ENCOUNTER — Other Ambulatory Visit: Payer: Self-pay | Admitting: Family Medicine

## 2021-06-26 ENCOUNTER — Other Ambulatory Visit: Payer: Self-pay

## 2021-06-26 VITALS — Ht 72.0 in | Wt 227.0 lb

## 2021-06-26 DIAGNOSIS — G2581 Restless legs syndrome: Secondary | ICD-10-CM

## 2021-06-26 DIAGNOSIS — F419 Anxiety disorder, unspecified: Secondary | ICD-10-CM

## 2021-06-26 DIAGNOSIS — M1611 Unilateral primary osteoarthritis, right hip: Secondary | ICD-10-CM | POA: Insufficient documentation

## 2021-06-26 MED ORDER — HYDROCODONE-ACETAMINOPHEN 5-325 MG PO TABS: 1.0000 | ORAL_TABLET | Freq: Two times a day (BID) | ORAL | 0 refills | Status: DC | PRN

## 2021-06-26 NOTE — Progress Notes (Signed)
The patient is a very pleasant 60 year old gentleman referred from Dr. Romeo Apple with orthopedics in [facility] to evaluate and treat significant arthritis of the right hip.  The patient reports significant right hip stiffness and pain is been slowly worsening over 2 years now.  He does have plain films and a MRI of the canopy system of his right hip for me to review.  He has tried and failed conservative treatment at this point and is sent here to consider hip replacement surgery through direct anterior approach to his right hip.  His main comorbidity is diabetes.  Hemoglobin A1c just 3 weeks ago was 12.  His hemoglobin A1c over the last few years has not been below 8.  I talked him in length in detail with the fact that he would be high infection risk and we are not allowed to schedule any joint replacement surgery until someone's hemoglobin A1c is 7.7 or below.  He does deny any issues with his left hip.  At this point his right hip pain is detrimentally affecting his mobility, his quality of life and his actives daily living.  He says his blood glucose medications have been switched up recently and he is trying to work on better blood glucose control.  He is not obese.  On examination his right hip shows significant stiffness with internal and external rotation as well as pain in the groin.  His left hip moves a little more smoothly.  The plain films of his hips were reviewed.  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  I did review the MRI of his right hip and it does show degenerative labral tearing and evidence of femoral acetabular impingement with moderate wear of the cartilage on the femoral head and acetabular the superior lateral aspect mainly.  I talked to him in length about hip replacement surgery he went over his x-rays.  I gave him a handout about hip replacement surgery.  He has my card and my  surgery scheduler's card.  He understands that our hands are tied until his blood glucose is under better control.  Once that is the case he will let us know and we can schedule surgery.  He is requesting hydrocodone.  I can refill this just 1 time only.  He needs to use it sparingly and he understands that I have explained that arthritic pain should not be treated with narcotics.

## 2021-06-28 ENCOUNTER — Telehealth: Payer: Self-pay | Admitting: Family Medicine

## 2021-06-28 NOTE — Telephone Encounter (Signed)
Left message for patient to call back and schedule Medicare Annual Wellness Visit (AWV) in office.  ° °If not able to come in office, please offer to do virtually or by telephone.  Left office number and my jabber #[phone]. ° °Due for AWVI ° °Please schedule at anytime with Nurse Health Advisor. °  °

## 2021-07-05 ENCOUNTER — Encounter: Payer: Self-pay | Admitting: Family Medicine

## 2021-07-11 DIAGNOSIS — E118 Type 2 diabetes mellitus with unspecified complications: Secondary | ICD-10-CM | POA: Diagnosis not present

## 2021-07-26 ENCOUNTER — Other Ambulatory Visit: Payer: Self-pay | Admitting: Family Medicine

## 2021-07-26 DIAGNOSIS — F419 Anxiety disorder, unspecified: Secondary | ICD-10-CM

## 2021-07-28 DIAGNOSIS — E119 Type 2 diabetes mellitus without complications: Secondary | ICD-10-CM | POA: Diagnosis not present

## 2021-08-03 ENCOUNTER — Other Ambulatory Visit: Payer: Self-pay | Admitting: Family Medicine

## 2021-08-03 ENCOUNTER — Other Ambulatory Visit: Payer: Self-pay | Admitting: Internal Medicine

## 2021-08-03 DIAGNOSIS — F419 Anxiety disorder, unspecified: Secondary | ICD-10-CM

## 2021-08-10 DIAGNOSIS — E118 Type 2 diabetes mellitus with unspecified complications: Secondary | ICD-10-CM | POA: Diagnosis not present

## 2021-08-15 ENCOUNTER — Encounter: Payer: Self-pay | Admitting: Family Medicine

## 2021-08-15 DIAGNOSIS — E118 Type 2 diabetes mellitus with unspecified complications: Secondary | ICD-10-CM

## 2021-08-15 DIAGNOSIS — Z794 Long term (current) use of insulin: Secondary | ICD-10-CM

## 2021-08-27 ENCOUNTER — Other Ambulatory Visit: Payer: Self-pay | Admitting: Family Medicine

## 2021-08-27 ENCOUNTER — Other Ambulatory Visit: Payer: Self-pay | Admitting: Orthopedic Surgery

## 2021-08-27 DIAGNOSIS — M5410 Radiculopathy, site unspecified: Secondary | ICD-10-CM

## 2021-08-27 DIAGNOSIS — S73191D Other sprain of right hip, subsequent encounter: Secondary | ICD-10-CM

## 2021-08-27 DIAGNOSIS — M5126 Other intervertebral disc displacement, lumbar region: Secondary | ICD-10-CM

## 2021-08-28 NOTE — Telephone Encounter (Signed)
Okay to refill? Computer was giving me high interactions warning.

## 2021-09-04 ENCOUNTER — Other Ambulatory Visit (INDEPENDENT_AMBULATORY_CARE_PROVIDER_SITE_OTHER): Payer: Medicare HMO

## 2021-09-04 ENCOUNTER — Encounter: Payer: Self-pay | Admitting: Family Medicine

## 2021-09-04 DIAGNOSIS — Z794 Long term (current) use of insulin: Secondary | ICD-10-CM

## 2021-09-04 DIAGNOSIS — E875 Hyperkalemia: Secondary | ICD-10-CM

## 2021-09-04 DIAGNOSIS — E118 Type 2 diabetes mellitus with unspecified complications: Secondary | ICD-10-CM | POA: Diagnosis not present

## 2021-09-04 LAB — BASIC METABOLIC PANEL
BUN: 16 mg/dL (ref 6–23)
CO2: 30 mEq/L (ref 19–32)
Calcium: 9.8 mg/dL (ref 8.4–10.5)
Chloride: 98 mEq/L (ref 96–112)
Creatinine, Ser: 1.02 mg/dL (ref 0.40–1.50)
GFR: 80.08 mL/min (ref >60.00)
Glucose, Bld: 176 mg/dL — ABNORMAL HIGH (ref 70–99)
Potassium: 5 mEq/L (ref 3.5–5.1)
Sodium: 138 mEq/L (ref 135–145)

## 2021-09-04 LAB — HEMOGLOBIN A1C: Hgb A1c MFr Bld: 7.4 % — ABNORMAL HIGH (ref 4.6–6.5)

## 2021-09-08 ENCOUNTER — Other Ambulatory Visit: Payer: Self-pay

## 2021-09-09 DIAGNOSIS — E118 Type 2 diabetes mellitus with unspecified complications: Secondary | ICD-10-CM | POA: Diagnosis not present

## 2021-09-13 ENCOUNTER — Telehealth: Payer: Self-pay | Admitting: Family Medicine

## 2021-09-13 NOTE — Telephone Encounter (Signed)
Left message for patient to call back and schedule Medicare Annual Wellness Visit (AWV) in office.  ° °If not able to come in office, please offer to do virtually or by telephone.  Left office number and my jabber #[phone]. ° °Due for AWVI ° °Please schedule at anytime with Nurse Health Advisor. °  °

## 2021-09-25 ENCOUNTER — Other Ambulatory Visit: Payer: Self-pay | Admitting: Physician Assistant

## 2021-09-25 DIAGNOSIS — M1611 Unilateral primary osteoarthritis, right hip: Secondary | ICD-10-CM

## 2021-09-27 ENCOUNTER — Other Ambulatory Visit: Payer: Self-pay | Admitting: Family Medicine

## 2021-09-27 ENCOUNTER — Other Ambulatory Visit: Payer: Self-pay | Admitting: Orthopedic Surgery

## 2021-09-27 ENCOUNTER — Encounter: Payer: Self-pay | Admitting: Family Medicine

## 2021-09-27 DIAGNOSIS — M5410 Radiculopathy, site unspecified: Secondary | ICD-10-CM

## 2021-09-27 DIAGNOSIS — S73191D Other sprain of right hip, subsequent encounter: Secondary | ICD-10-CM

## 2021-09-27 DIAGNOSIS — M5126 Other intervertebral disc displacement, lumbar region: Secondary | ICD-10-CM

## 2021-09-28 ENCOUNTER — Encounter: Payer: Self-pay | Admitting: Orthopaedic Surgery

## 2021-10-05 NOTE — Progress Notes (Signed)
Surgical Instructions    Your procedure is scheduled on 10/10/21.  Report to [facility] Main Entrance "A" at 5:30 A.M., then check in with the Admitting office.  Call this number if you have problems the morning of surgery:  [phone]   If you have any questions prior to your surgery date call [phone]: Open Monday-Friday 8am-4pm    Remember:  Do not eat after midnight the night before your surgery  You may drink clear liquids until 4:30 the morning of your surgery.   Clear liquids allowed are: Water, Non-Citrus Juices (without pulp), Carbonated Beverages, Clear Tea, Black Coffee ONLY (NO MILK, CREAM OR POWDERED CREAMER of any kind), and Gatorade.  Please drink only sugar free or low sugar drinks.  Please complete your PRE-SURGERY Gatorade that was provided to you by 4:30 the morning of surgery.  Please, if able, drink it in one setting. DO NOT SIP.     Take these medicines the morning of surgery with A SIP OF WATER:  atorvastatin (LIPITOR) escitalopram (LEXAPRO) gabapentin (NEURONTIN)  metoprolol succinate (TOPROL-XL)  omeprazole (PRILOSEC)  AS NEEDED: acetaminophen (TYLENOL) acetaminophen-codeine (TYLENOL #3) nitroGLYCERIN (NITROSTAT)   Follow your surgeon's instructions on when to stop Plavix and Aspirin.  If no instructions were given by your surgeon then you will need to call the office to get those instructions.      As of today, STOP taking any Aleve, Naproxen, Ibuprofen, Motrin, Advil, Goody's, BC's, all herbal medications, fish oil, and all vitamins.  WHAT DO I DO ABOUT MY DIABETES MEDICATION?   Do not take oral diabetes medicines (pills) the morning of surgery. DO NOT take Metformin morning of surgery.  THE NIGHT BEFORE SURGERY, take __45_ units (50%) of __insulin detemir (LEVEMIR) _insulin.       The day of surgery, do not take other diabetes injectables, including Byetta (exenatide), Bydureon (exenatide ER), Victoza (liraglutide), or Trulicity  (dulaglutide).  If your CBG is greater than 220 mg/dL, you may take  of your sliding scale (correction) dose of insulin.   HOW TO MANAGE YOUR DIABETES BEFORE AND AFTER SURGERY  Why is it important to control my blood sugar before and after surgery? Improving blood sugar levels before and after surgery helps healing and can limit problems. A way of improving blood sugar control is eating a healthy diet by:  Eating less sugar and carbohydrates  Increasing activity/exercise  Talking with your doctor about reaching your blood sugar goals High blood sugars (greater than 180 mg/dL) can raise your risk of infections and slow your recovery, so you will need to focus on controlling your diabetes during the weeks before surgery. Make sure that the doctor who takes care of your diabetes knows about your planned surgery including the date and location.  How do I manage my blood sugar before surgery? Check your blood sugar at least 4 times a day, starting 2 days before surgery, to make sure that the level is not too high or low.  Check your blood sugar the morning of your surgery when you wake up and every 2 hours until you get to the Short Stay unit.  If your blood sugar is less than 70 mg/dL, you will need to treat for low blood sugar: Do not take insulin. Treat a low blood sugar (less than 70 mg/dL) with  cup of clear juice (cranberry or apple), 4 glucose tablets, OR glucose gel. Recheck blood sugar in 15 minutes after treatment (to make sure it is greater than 70 mg/dL).  If your blood sugar is not greater than 70 mg/dL on recheck, call [phone] for further instructions. Report your blood sugar to the short stay nurse when you get to Short Stay.  If you are admitted to the hospital after surgery: Your blood sugar will be checked by the staff and you will probably be given insulin after surgery (instead of oral diabetes medicines) to make sure you have good blood sugar levels. The goal for  blood sugar control after surgery is 80-180 mg/dL.            Do not wear jewelry  Do not wear lotions, powders, colognes, or deodorant. Do not shave 48 hours prior to surgery.  Men may shave face and neck. Do not bring valuables to the hospital.   [facility] is not responsible for any belongings or valuables. .   Do NOT Smoke (Tobacco/Vaping)  24 hours prior to your procedure  If you use a CPAP at night, you may bring your mask for your overnight stay.   Contacts, glasses, hearing aids, dentures or partials may not be worn into surgery, please bring cases for these belongings   For patients admitted to the hospital, discharge time will be determined by your treatment team.   Patients discharged the day of surgery will not be allowed to drive home, and someone needs to stay with them for 24 hours.  NO VISITORS WILL BE ALLOWED IN PRE-OP WHERE PATIENTS ARE PREPPED FOR SURGERY.  ONLY 1 SUPPORT PERSON MAY BE PRESENT IN THE WAITING ROOM WHILE YOU ARE IN SURGERY.  IF YOU ARE TO BE ADMITTED, ONCE YOU ARE IN YOUR ROOM YOU WILL BE ALLOWED TWO (2) VISITORS. 1 (ONE) VISITOR MAY STAY OVERNIGHT BUT MUST ARRIVE TO THE ROOM BY 8pm.  Minor children may have two parents present. Special consideration for safety and communication needs will be reviewed on a case by case basis.  Special instructions:    Oral Hygiene is also important to reduce your risk of infection.  Remember - BRUSH YOUR TEETH THE MORNING OF SURGERY WITH YOUR REGULAR TOOTHPASTE   [facility]- Preparing For Surgery  Before surgery, you can play an important role. Because skin is not sterile, your skin needs to be as free of germs as possible. You can reduce the number of germs on your skin by washing with CHG (chlorahexidine gluconate) Soap before surgery.  CHG is an antiseptic cleaner which kills germs and bonds with the skin to continue killing germs even after washing.     Please do not use if you have an allergy to CHG or  antibacterial soaps. If your skin becomes reddened/irritated stop using the CHG.  Do not shave (including legs and underarms) for at least 48 hours prior to first CHG shower. It is OK to shave your face.  Please follow these instructions carefully.     Shower the NIGHT BEFORE SURGERY and the MORNING OF SURGERY with CHG Soap.   If you chose to wash your hair, wash your hair first as usual with your normal shampoo. After you shampoo, rinse your hair and body thoroughly to remove the shampoo.  Then [employer] and genitals (private parts) with your normal soap and rinse thoroughly to remove soap.  After that Use CHG Soap as you would any other liquid soap. You can apply CHG directly to the skin and wash gently with a scrungie or a clean washcloth.   Apply the CHG Soap to your body ONLY FROM THE NECK  DOWN.  Do not use on open wounds or open sores. Avoid contact with your eyes, ears, mouth and genitals (private parts). Wash Face and genitals (private parts)  with your normal soap.   Wash thoroughly, paying special attention to the area where your surgery will be performed.  Thoroughly rinse your body with warm water from the neck down.  DO NOT shower/wash with your normal soap after using and rinsing off the CHG Soap.  Pat yourself dry with a CLEAN TOWEL.  Wear CLEAN PAJAMAS to bed the night before surgery  Place CLEAN SHEETS on your bed the night before your surgery  DO NOT SLEEP WITH PETS.   Day of Surgery:  Take a shower with CHG soap. Wear Clean/Comfortable clothing the morning of surgery Do not apply any deodorants/lotions.   Remember to brush your teeth WITH YOUR REGULAR TOOTHPASTE.    COVID testing  If you are going to stay overnight or be admitted after your procedure/surgery and require a pre-op COVID test, please follow these instructions after your COVID test   You are not required to quarantine however you are required to wear a well-fitting mask when you are out and  around people not in your household.  If your mask becomes wet or soiled, replace with a new one.  Wash your hands often with soap and water for 20 seconds or clean your hands with an alcohol-based hand sanitizer that contains at least 60% alcohol.  Do not share personal items.  Notify your provider: if you are in close contact with someone who has COVID  or if you develop a fever of 100.4 or greater, sneezing, cough, sore throat, shortness of breath or body aches.    Please read over the following fact sheets that you were given.

## 2021-10-06 ENCOUNTER — Other Ambulatory Visit: Payer: Self-pay

## 2021-10-06 ENCOUNTER — Encounter (HOSPITAL_COMMUNITY)
Admission: RE | Admit: 2021-10-06 | Discharge: 2021-10-06 | Disposition: A | Discharge status: Home / Self Care | Payer: Medicare HMO | Source: Ambulatory Visit | Attending: Orthopaedic Surgery | Admitting: Orthopaedic Surgery

## 2021-10-06 ENCOUNTER — Encounter (HOSPITAL_COMMUNITY): Payer: Self-pay

## 2021-10-06 VITALS — BP 121/82 | HR 90 | Temp 98.3°F | Resp 17 | Ht 72.0 in | Wt 221.5 lb

## 2021-10-06 DIAGNOSIS — I2510 Atherosclerotic heart disease of native coronary artery without angina pectoris: Secondary | ICD-10-CM | POA: Insufficient documentation

## 2021-10-06 DIAGNOSIS — Z20822 Contact with and (suspected) exposure to covid-19: Secondary | ICD-10-CM | POA: Diagnosis not present

## 2021-10-06 DIAGNOSIS — M1611 Unilateral primary osteoarthritis, right hip: Secondary | ICD-10-CM | POA: Diagnosis not present

## 2021-10-06 DIAGNOSIS — I10 Essential (primary) hypertension: Secondary | ICD-10-CM | POA: Diagnosis not present

## 2021-10-06 DIAGNOSIS — Z01818 Encounter for other preprocedural examination: Secondary | ICD-10-CM | POA: Diagnosis not present

## 2021-10-06 LAB — SURGICAL PCR SCREEN
MRSA, PCR: NEGATIVE
Staphylococcus aureus: NEGATIVE

## 2021-10-06 LAB — GLUCOSE, CAPILLARY: Glucose-Capillary: 117 mg/dL — ABNORMAL HIGH (ref 70–99)

## 2021-10-06 LAB — BASIC METABOLIC PANEL
Anion gap: 7 (ref 5–15)
BUN: 12 mg/dL (ref 6–20)
CO2: 28 mmol/L (ref 22–32)
Calcium: 9 mg/dL (ref 8.9–10.3)
Chloride: 97 mmol/L — ABNORMAL LOW (ref 98–111)
Creatinine, Ser: 0.91 mg/dL (ref 0.61–1.24)
GFR, Estimated: >60 mL/min (ref >60)
Glucose, Bld: 123 mg/dL — ABNORMAL HIGH (ref 70–99)
Potassium: 4.3 mmol/L (ref 3.5–5.1)
Sodium: 132 mmol/L — ABNORMAL LOW (ref 135–145)

## 2021-10-06 LAB — TYPE AND SCREEN
ABO/RH(D): O POS
Antibody Screen: NEGATIVE

## 2021-10-06 LAB — CBC
HCT: 41 % (ref 39.0–52.0)
Hemoglobin: 14.9 g/dL (ref 13.0–17.0)
MCH: 32.3 pg (ref 26.0–34.0)
MCHC: 36.3 g/dL — ABNORMAL HIGH (ref 30.0–36.0)
MCV: 88.9 fL (ref 80.0–100.0)
Platelets: 289 10*3/uL (ref 150–400)
RBC: 4.61 MIL/uL (ref 4.22–5.81)
RDW: 12.4 % (ref 11.5–15.5)
WBC: 8.9 10*3/uL (ref 4.0–10.5)
nRBC: 0 % (ref 0.0–0.2)

## 2021-10-06 LAB — SARS CORONAVIRUS 2 BY RT PCR (HOSPITAL ORDER, PERFORMED IN ~~LOC~~ HOSPITAL LAB): SARS Coronavirus 2: NEGATIVE

## 2021-10-06 NOTE — Progress Notes (Signed)
PCP: Abbe Amsterdam, MD ?Cardiologist:Kadie Tobb, MD ? ?EKG:10/06/21 ?CXR: 06/25/19 ?ECHO: 05/12/20 ?Stress Test: denies ?Cardiac Cath: 06/26/19 ? ?Fasting Blood Sugar- 130 ?Checks Blood Sugar___ times a day.  Wears glucose monitor. ? ?ASA: Last dose 09/29/21 ?Plavix: Last dose 09/29/21 ? ?OSA/CPAP: No ? ?Covid test 10/06/21 at PAT ? ?Anesthesia Review: Yes, cardiac history.  ? ?Patient denies shortness of breath, fever, cough, and chest pain at PAT appointment. ? ?Patient verbalized understanding of instructions provided today at the PAT appointment.  Patient asked to review instructions at home and day of surgery.   ?

## 2021-10-09 NOTE — Progress Notes (Signed)
Anesthesia Chart Review: ? 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 Last seen by Dr. Servando Salina 06/19/21, doing well from cardiovascular standpoint. Surgical risk was addressed. Per note, "The patient does not have any unstable cardiac conditions.  Upon evaluation today, he can achieve 4 METs or greater without anginal symptoms.  According to [facility] and AHA guidelines, he requires no further cardiac workup prior to his noncardiac surgery and should be at acceptable risk."   ? 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 ? ?Preop labs reviewed, mild hyponatremia wit sodium 132.  ? ?EKG 10/06/21: NSR. Rate 90. ? ?TTE 05/12/20: ? 1. Left ventricular ejection fraction, by estimation, is 60 to 65%. The  ?left ventricle has normal function. The left ventricle has no regional  ?wall motion abnormalities. There is moderate left ventricular hypertrophy.  ?Left ventricular diastolic  ?parameters are consistent with Grade I diastolic dysfunction (impaired  ?relaxation).  ? 2. Right ventricular systolic function is normal. The right ventricular  ?size is normal. There is normal pulmonary artery systolic pressure.  ? 3. The mitral valve is normal in structure. No evidence of mitral valve  ?regurgitation. No evidence of mitral stenosis.  ? 4. The aortic valve is tricuspid. Aortic valve regurgitation is not  ?visualized. No aortic stenosis is present.  ? 5. The inferior vena cava is normal in size with greater than 50%  ?respiratory variability, suggesting right atrial pressure of 3 mmHg.  ? ?Event monitor 04/20/20: ?The patient wore the monitor for 9 days 21 hours starting 03/29/2020. ?Indication: Syncope ?  ?The minimum heart rate was 60 bpm, maximum heart rate was 127 bpm, and average heart rate  was 86 bpm. ?Predominant underlying rhythm was Sinus Rhythm.  First-degree AV block was present. ?  ?  ?Premature atrial complexes were rare less than 1%. ?Premature Ventricular complexes rare less than 1%.   ?  ?No ventricular tachycardia, no pauses, No second-degree AV block, no supraventricular tachycardia and no atrial fibrillation present. ?  ?11 patient triggered events and 3 diary events were associated with sinus rhythm. ?  ?Conclusion: Unremarkable study no evidence of any significant arrhythmia. ? ?Cath 06/26/19: ?SUMMARY ?Single-vessel CAD with diffuse spasm of the apical LAD with a focal lesion of roughly 80% near the apex followed by an extensive diffuse 50 to 55% stenosis around the apex as poor runoff) -> not a good candidate for PCI based on the apical nature and small caliber vessel at this point. ?Likely culprit lesion is the apical LAD 80% stenosis. ->  Best treated medically. ?Mid apical LAD spasm somewhat alleviated with nitroglycerin IC.Marland Kitchen ?Low normal to mildly reduced LVEF with anterior apical and apical hypokinesis and high normal LVEDP ?  ?RECOMMENDATIONS ?Admit to telemetry, will start IV heparin 8 hours after sheath removal. ?Add amlodipine for blood pressure control due to spasm ?Will use IV nitroglycerin for blood pressure control, and switch to Imdur on discharge based on spasm ?Would recommend at least 3 to 6 months of clopidogrel given ACS presentation. ? ? ? ?Antionette Poles, PA-C ?[facility] Short Stay Center/Anesthesiology ?Phone [phone] ?10/09/2021 9:22 AM ? ?

## 2021-10-09 NOTE — Anesthesia Preprocedure Evaluation (Addendum)
Anesthesia Evaluation  ?Patient identified by MRN, date of birth, ID band ?Patient awake ? ? ? ?Reviewed: ?Allergy & Precautions, NPO status , Patient's Chart, lab work & pertinent test results ? ?History of Anesthesia Complications ?(+) PONV and history of anesthetic complications ? ?Airway ?Mallampati: II ? ?TM Distance: >3 FB ?Neck ROM: Full ? ? ? Dental ?no notable dental hx. ? ?  ?Pulmonary ?neg pulmonary ROS,  ?  ?Pulmonary exam normal ? ? ? ? ? ? ? Cardiovascular ?hypertension, Pt. on medications and Pt. on home beta blockers ?+ CAD and + Past MI (2020)  ? ?Rhythm:Regular Rate:Normal ? ? ?  ?Neuro/Psych ? Headaches, Anxiety   ? GI/Hepatic ?Neg liver ROS, GERD  Medicated,  ?Endo/Other  ?diabetes, Type 2, Insulin Dependent, Oral Hypoglycemic AgentsHypothyroidism  ? Renal/GU ?negative Renal ROS  ? ?  ?Musculoskeletal ? ?(+) Arthritis , Osteoarthritis,   ? Abdominal ?Normal abdominal exam  (+)   ?Peds ? Hematology ?negative hematology ROS ?(+)   ?Anesthesia Other Findings ? ? Reproductive/Obstetrics ? ?  ? ? ? ? ? ? ? ? ? ? ? ? ? ?  ?  ? ? ? ? ? ? ?Anesthesia Physical ?Anesthesia Plan ? ?ASA: 3 ? ?Anesthesia Plan: MAC and Spinal  ? ?Post-op Pain Management:   ? ?Induction: Intravenous ? ?PONV Risk Score and Plan: 2 and Ondansetron, Dexamethasone, Propofol infusion, Midazolam and Treatment may vary due to age or medical condition ? ?Airway Management Planned: Simple Face Mask, Natural Airway and Nasal Cannula ? ?Additional Equipment: None ? ?Intra-op Plan:  ? ?Post-operative Plan:  ? ?Informed Consent: I have reviewed the patients History and Physical, chart, labs and discussed the procedure including the risks, benefits and alternatives for the proposed anesthesia with the patient or authorized representative who has indicated his/her understanding and acceptance.  ? ? ? ?Dental advisory given ? ?Plan Discussed with: CRNA ? ?Anesthesia Plan Comments: (PAT note by Karoline Caldwell,  PA-C: ? 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 Last seen by Dr. Harriet Masson 06/19/21, doing well from cardiovascular standpoint. Surgical risk was addressed. Per note, "The patient does not have any unstable cardiac conditions.  Upon evaluation today, he can achieve 4 METs or greater without anginal symptoms.  According to [facility] and AHA guidelines, he requires no further cardiac workup prior to his noncardiac surgery and should be at acceptable risk."   ? 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 ? ?Preop labs reviewed, mild hyponatremia wit sodium 132.  ? ?EKG 10/06/21: NSR. Rate 90. ? ?TTE 05/12/20: ??1. Left ventricular ejection fraction, by estimation, is 60 to 65%. The  ?left ventricle has normal function. The left ventricle has no regional  ?wall motion abnormalities. There is moderate left ventricular hypertrophy.  ?Left ventricular diastolic  ?parameters are consistent with Grade I diastolic dysfunction (impaired  ?relaxation).  ??2. Right ventricular systolic function is normal. The right ventricular  ?size is normal. There is normal pulmonary artery systolic pressure.  ??3. The mitral valve is normal in structure. No evidence of mitral valve  ?regurgitation. No evidence of mitral stenosis.  ??4. The aortic valve is tricuspid. Aortic valve regurgitation is not  ?visualized. No aortic stenosis is present.  ??5. The inferior vena cava is normal in size with  greater than 50%  ?respiratory variability, suggesting right atrial pressure of 3 mmHg.  ? ?Event monitor 04/20/20: ?The patient wore the monitor for 9 days 21 hours starting 03/29/2020. ?Indication: Syncope ?? ?The minimum heart rate was 60 bpm, maximum heart rate was 127 bpm, and average heart rate was 86  bpm. ?Predominant underlying rhythm was Sinus Rhythm. ?First-degree AV block was present. ?? ?? ?Premature atrial complexes were rare less than 1%. ?Premature Ventricular complexes rare less than 1%. ? ?? ?No ventricular tachycardia, no pauses, No second-degree AV block, no supraventricular tachycardia and no atrial fibrillation present. ?? ?11 patient triggered events and 3 diary events were associated with sinus rhythm. ?? ?Conclusion: Unremarkable study no evidence of any significant arrhythmia. ? ?Cath 06/26/19: ?SUMMARY ?1. Single-vessel CAD with diffuse spasm of the apical LAD with a focal lesion of roughly 80% near the apex followed by an extensive diffuse 50 to 55% stenosis around the apex as poor runoff) -> not a good candidate for PCI based on the apical nature and small caliber vessel at this point. ? o Likely culprit lesion is the apical LAD 80% stenosis. ->??Best treated medically. ?2. Mid apical LAD spasm somewhat alleviated with nitroglycerin IC.Marland Kitchen ?3. Low normal to mildly reduced LVEF with anterior apical and apical hypokinesis and high normal LVEDP ?? ?RECOMMENDATIONS ?? Admit to telemetry, will start IV heparin 8 hours after sheath removal. ?? Add amlodipine for blood pressure control due to spasm ?? Will use IV nitroglycerin for blood pressure control, and switch to Imdur on discharge based on spasm ?? Would recommend at least 3 to 6 months of clopidogrel given ACS presentation. ? ?)  ? ? ? ? ?Anesthesia Quick Evaluation ? ?

## 2021-10-10 ENCOUNTER — Observation Stay (HOSPITAL_COMMUNITY)
Admission: RE | Admit: 2021-10-10 | Discharge: 2021-10-11 | Disposition: A | Discharge status: Home Health | Payer: Medicare HMO | Attending: Orthopaedic Surgery | Admitting: Orthopaedic Surgery

## 2021-10-10 ENCOUNTER — Ambulatory Visit (HOSPITAL_COMMUNITY): Payer: Medicare HMO

## 2021-10-10 ENCOUNTER — Ambulatory Visit (HOSPITAL_BASED_OUTPATIENT_CLINIC_OR_DEPARTMENT_OTHER): Payer: Medicare HMO | Admitting: Certified Registered Nurse Anesthetist

## 2021-10-10 ENCOUNTER — Ambulatory Visit (HOSPITAL_COMMUNITY): Payer: Medicare HMO | Admitting: Physician Assistant

## 2021-10-10 ENCOUNTER — Observation Stay (HOSPITAL_COMMUNITY): Payer: Medicare HMO

## 2021-10-10 ENCOUNTER — Other Ambulatory Visit: Payer: Self-pay

## 2021-10-10 ENCOUNTER — Encounter (HOSPITAL_COMMUNITY)
Admission: RE | Disposition: A | Discharge status: Home Health | Payer: Self-pay | Source: Home / Self Care | Attending: Orthopaedic Surgery

## 2021-10-10 ENCOUNTER — Encounter (HOSPITAL_COMMUNITY): Payer: Self-pay | Admitting: Orthopaedic Surgery

## 2021-10-10 DIAGNOSIS — Z79899 Other long term (current) drug therapy: Secondary | ICD-10-CM | POA: Insufficient documentation

## 2021-10-10 DIAGNOSIS — Z471 Aftercare following joint replacement surgery: Secondary | ICD-10-CM | POA: Diagnosis not present

## 2021-10-10 DIAGNOSIS — Z419 Encounter for procedure for purposes other than remedying health state, unspecified: Secondary | ICD-10-CM

## 2021-10-10 DIAGNOSIS — I10 Essential (primary) hypertension: Secondary | ICD-10-CM | POA: Diagnosis not present

## 2021-10-10 DIAGNOSIS — Z8673 Personal history of transient ischemic attack (TIA), and cerebral infarction without residual deficits: Secondary | ICD-10-CM | POA: Insufficient documentation

## 2021-10-10 DIAGNOSIS — Z7984 Long term (current) use of oral hypoglycemic drugs: Secondary | ICD-10-CM | POA: Diagnosis not present

## 2021-10-10 DIAGNOSIS — I252 Old myocardial infarction: Secondary | ICD-10-CM | POA: Insufficient documentation

## 2021-10-10 DIAGNOSIS — Z96651 Presence of right artificial knee joint: Secondary | ICD-10-CM | POA: Diagnosis not present

## 2021-10-10 DIAGNOSIS — Z96641 Presence of right artificial hip joint: Secondary | ICD-10-CM

## 2021-10-10 DIAGNOSIS — Z794 Long term (current) use of insulin: Secondary | ICD-10-CM | POA: Diagnosis not present

## 2021-10-10 DIAGNOSIS — I2510 Atherosclerotic heart disease of native coronary artery without angina pectoris: Secondary | ICD-10-CM | POA: Insufficient documentation

## 2021-10-10 DIAGNOSIS — M1611 Unilateral primary osteoarthritis, right hip: Secondary | ICD-10-CM | POA: Diagnosis not present

## 2021-10-10 DIAGNOSIS — E119 Type 2 diabetes mellitus without complications: Secondary | ICD-10-CM | POA: Diagnosis not present

## 2021-10-10 DIAGNOSIS — E785 Hyperlipidemia, unspecified: Secondary | ICD-10-CM | POA: Insufficient documentation

## 2021-10-10 DIAGNOSIS — I119 Hypertensive heart disease without heart failure: Secondary | ICD-10-CM | POA: Diagnosis not present

## 2021-10-10 HISTORY — PX: TOTAL HIP ARTHROPLASTY: SHX124

## 2021-10-10 LAB — GLUCOSE, CAPILLARY
Glucose-Capillary: 201 mg/dL — ABNORMAL HIGH (ref 70–99)
Glucose-Capillary: 139 mg/dL — ABNORMAL HIGH (ref 70–99)
Glucose-Capillary: 147 mg/dL — ABNORMAL HIGH (ref 70–99)
Glucose-Capillary: 213 mg/dL — ABNORMAL HIGH (ref 70–99)
Glucose-Capillary: 335 mg/dL — ABNORMAL HIGH (ref 70–99)

## 2021-10-10 SURGERY — ARTHROPLASTY, HIP, TOTAL, ANTERIOR APPROACH
Anesthesia: Monitor Anesthesia Care | Site: Hip | Laterality: Right

## 2021-10-10 MED ORDER — FENTANYL CITRATE (PF) 100 MCG/2ML IJ SOLN: 25.0000 ug | INTRAMUSCULAR | Status: DC | PRN

## 2021-10-10 MED ORDER — ISOSORBIDE MONONITRATE ER 30 MG PO TB24
30.0000 mg | ORAL_TABLET | Freq: Every day | ORAL | Status: DC
Start: 2021-10-10 — End: 2021-10-11
  Administered 2021-10-10 – 2021-10-11 (×2): 30 mg via ORAL
  Filled 2021-10-10 (×2): qty 1

## 2021-10-10 MED ORDER — PHENYLEPHRINE HCL-NACL 20-0.9 MG/250ML-% IV SOLN
INTRAVENOUS | Status: DC | PRN
  Administered 2021-10-10: 20 ug/min via INTRAVENOUS

## 2021-10-10 MED ORDER — FENTANYL CITRATE (PF) 250 MCG/5ML IJ SOLN
INTRAMUSCULAR | Status: AC
  Filled 2021-10-10: qty 5

## 2021-10-10 MED ORDER — ROPINIROLE HCL 1 MG PO TABS
1.0000 mg | ORAL_TABLET | Freq: Every day | ORAL | Status: DC
  Administered 2021-10-10: 1 mg via ORAL
  Filled 2021-10-10: qty 1

## 2021-10-10 MED ORDER — ONDANSETRON HCL 4 MG/2ML IJ SOLN
INTRAMUSCULAR | Status: AC
  Filled 2021-10-10: qty 2

## 2021-10-10 MED ORDER — MIDAZOLAM HCL 2 MG/2ML IJ SOLN
INTRAMUSCULAR | Status: AC
  Filled 2021-10-10: qty 2

## 2021-10-10 MED ORDER — ATORVASTATIN CALCIUM 40 MG PO TABS
40.0000 mg | ORAL_TABLET | Freq: Every day | ORAL | Status: DC
  Administered 2021-10-10 – 2021-10-11 (×2): 40 mg via ORAL
  Filled 2021-10-10 (×3): qty 1

## 2021-10-10 MED ORDER — ACETAMINOPHEN 325 MG PO TABS
325.0000 mg | ORAL_TABLET | Freq: Four times a day (QID) | ORAL | Status: DC | PRN
  Administered 2021-10-10: 650 mg via ORAL
  Filled 2021-10-10: qty 2

## 2021-10-10 MED ORDER — 0.9 % SODIUM CHLORIDE (POUR BTL) OPTIME
TOPICAL | Status: DC | PRN
  Administered 2021-10-10: 1000 mL

## 2021-10-10 MED ORDER — INSULIN ASPART 100 UNIT/ML IJ SOLN: 0.0000 [IU] | Freq: Every day | INTRAMUSCULAR | Status: DC

## 2021-10-10 MED ORDER — POVIDONE-IODINE 10 % EX SWAB
2.0000 | Freq: Once | CUTANEOUS | Status: AC
Start: 2021-10-10 — End: 2021-10-10
  Administered 2021-10-10: 2 via TOPICAL

## 2021-10-10 MED ORDER — CEFAZOLIN SODIUM-DEXTROSE 2-4 GM/100ML-% IV SOLN
2.0000 g | INTRAVENOUS | Status: AC
  Administered 2021-10-10: 2 g via INTRAVENOUS

## 2021-10-10 MED ORDER — ESCITALOPRAM OXALATE 20 MG PO TABS
20.0000 mg | ORAL_TABLET | Freq: Every day | ORAL | Status: DC
  Administered 2021-10-10 – 2021-10-11 (×2): 20 mg via ORAL
  Filled 2021-10-10 (×2): qty 1

## 2021-10-10 MED ORDER — CHLORHEXIDINE GLUCONATE 0.12 % MT SOLN: 15.0000 mL | Freq: Once | OROMUCOSAL | Status: AC

## 2021-10-10 MED ORDER — MIDAZOLAM HCL 2 MG/2ML IJ SOLN
INTRAMUSCULAR | Status: DC | PRN
  Administered 2021-10-10: 2 mg via INTRAVENOUS

## 2021-10-10 MED ORDER — ONDANSETRON HCL 4 MG/2ML IJ SOLN: 4.0000 mg | Freq: Four times a day (QID) | INTRAMUSCULAR | Status: DC | PRN

## 2021-10-10 MED ORDER — INSULIN ASPART 100 UNIT/ML IJ SOLN
0.0000 [IU] | INTRAMUSCULAR | Status: AC | PRN
  Administered 2021-10-10: 4 [IU] via SUBCUTANEOUS
  Administered 2021-10-10: 2 [IU] via SUBCUTANEOUS

## 2021-10-10 MED ORDER — INSULIN ASPART 100 UNIT/ML IJ SOLN: 0.0000 [IU] | Freq: Three times a day (TID) | INTRAMUSCULAR | Status: DC

## 2021-10-10 MED ORDER — METOCLOPRAMIDE HCL 5 MG/ML IJ SOLN: 5.0000 mg | Freq: Three times a day (TID) | INTRAMUSCULAR | Status: DC | PRN

## 2021-10-10 MED ORDER — PROPOFOL 500 MG/50ML IV EMUL
INTRAVENOUS | Status: DC | PRN
  Administered 2021-10-10: 60 ug/kg/min via INTRAVENOUS

## 2021-10-10 MED ORDER — OXYCODONE HCL 5 MG PO TABS
10.0000 mg | ORAL_TABLET | ORAL | Status: DC | PRN
  Administered 2021-10-10: 10 mg via ORAL
  Administered 2021-10-10 – 2021-10-11 (×3): 15 mg via ORAL
  Filled 2021-10-10 (×3): qty 3

## 2021-10-10 MED ORDER — OXYCODONE HCL 5 MG PO TABS
5.0000 mg | ORAL_TABLET | ORAL | Status: DC | PRN
  Administered 2021-10-10: 10 mg via ORAL
  Filled 2021-10-10 (×2): qty 2

## 2021-10-10 MED ORDER — HYDROMORPHONE HCL 1 MG/ML IJ SOLN
0.5000 mg | INTRAMUSCULAR | Status: DC | PRN
  Administered 2021-10-10 – 2021-10-11 (×3): 1 mg via INTRAVENOUS
  Filled 2021-10-10 (×3): qty 1

## 2021-10-10 MED ORDER — INSULIN ASPART 100 UNIT/ML IJ SOLN
0.0000 [IU] | Freq: Every day | INTRAMUSCULAR | Status: DC
  Administered 2021-10-10: 4 [IU] via SUBCUTANEOUS

## 2021-10-10 MED ORDER — SODIUM CHLORIDE 0.9 % IV SOLN: INTRAVENOUS | Status: DC

## 2021-10-10 MED ORDER — ALUM & MAG HYDROXIDE-SIMETH 200-200-20 MG/5ML PO SUSP: 30.0000 mL | ORAL | Status: DC | PRN

## 2021-10-10 MED ORDER — METHOCARBAMOL 1000 MG/10ML IJ SOLN
500.0000 mg | Freq: Four times a day (QID) | INTRAVENOUS | Status: DC | PRN
  Filled 2021-10-10: qty 5

## 2021-10-10 MED ORDER — INSULIN DETEMIR 100 UNIT/ML ~~LOC~~ SOLN
90.0000 [IU] | Freq: Every day | SUBCUTANEOUS | Status: DC
  Administered 2021-10-10: 90 [IU] via SUBCUTANEOUS
  Filled 2021-10-10 (×2): qty 0.9

## 2021-10-10 MED ORDER — BUPIVACAINE IN DEXTROSE 0.75-8.25 % IT SOLN
INTRATHECAL | Status: DC | PRN
  Administered 2021-10-10: 2 mL via INTRATHECAL

## 2021-10-10 MED ORDER — LACTATED RINGERS IV SOLN: INTRAVENOUS | Status: DC

## 2021-10-10 MED ORDER — PANTOPRAZOLE SODIUM 40 MG PO TBEC
40.0000 mg | DELAYED_RELEASE_TABLET | Freq: Every day | ORAL | Status: DC
  Administered 2021-10-11: 40 mg via ORAL
  Filled 2021-10-10: qty 1

## 2021-10-10 MED ORDER — INSULIN ASPART 100 UNIT/ML FLEXPEN: 20.0000 [IU] | PEN_INJECTOR | Freq: Three times a day (TID) | SUBCUTANEOUS | Status: DC

## 2021-10-10 MED ORDER — MENTHOL 3 MG MT LOZG: 1.0000 | LOZENGE | OROMUCOSAL | Status: DC | PRN

## 2021-10-10 MED ORDER — ORAL CARE MOUTH RINSE: 15.0000 mL | Freq: Once | OROMUCOSAL | Status: AC

## 2021-10-10 MED ORDER — METOCLOPRAMIDE HCL 5 MG PO TABS: 5.0000 mg | ORAL_TABLET | Freq: Three times a day (TID) | ORAL | Status: DC | PRN

## 2021-10-10 MED ORDER — SODIUM CHLORIDE 0.9 % IR SOLN
Status: DC | PRN
  Administered 2021-10-10: 3000 mL

## 2021-10-10 MED ORDER — TRANEXAMIC ACID-NACL 1000-0.7 MG/100ML-% IV SOLN
INTRAVENOUS | Status: AC
  Filled 2021-10-10: qty 100

## 2021-10-10 MED ORDER — DOCUSATE SODIUM 100 MG PO CAPS
100.0000 mg | ORAL_CAPSULE | Freq: Two times a day (BID) | ORAL | Status: DC
  Administered 2021-10-10 – 2021-10-11 (×2): 100 mg via ORAL
  Filled 2021-10-10 (×2): qty 1

## 2021-10-10 MED ORDER — DIPHENHYDRAMINE HCL 12.5 MG/5ML PO ELIX
12.5000 mg | ORAL_SOLUTION | ORAL | Status: DC | PRN
  Filled 2021-10-10: qty 10

## 2021-10-10 MED ORDER — METFORMIN HCL ER 500 MG PO TB24
500.0000 mg | ORAL_TABLET | Freq: Every day | ORAL | Status: DC
  Administered 2021-10-11: 500 mg via ORAL
  Filled 2021-10-10: qty 1

## 2021-10-10 MED ORDER — ONDANSETRON HCL 4 MG PO TABS: 4.0000 mg | ORAL_TABLET | Freq: Four times a day (QID) | ORAL | Status: DC | PRN

## 2021-10-10 MED ORDER — ASPIRIN EC 81 MG PO TBEC: 81.0000 mg | DELAYED_RELEASE_TABLET | Freq: Every day | ORAL | Status: DC

## 2021-10-10 MED ORDER — INSULIN ASPART 100 UNIT/ML IJ SOLN
INTRAMUSCULAR | Status: AC
  Filled 2021-10-10: qty 1

## 2021-10-10 MED ORDER — CEFAZOLIN SODIUM-DEXTROSE 2-4 GM/100ML-% IV SOLN
INTRAVENOUS | Status: AC
  Filled 2021-10-10: qty 100

## 2021-10-10 MED ORDER — ONDANSETRON HCL 4 MG/2ML IJ SOLN
INTRAMUSCULAR | Status: DC | PRN
  Administered 2021-10-10: 4 mg via INTRAVENOUS

## 2021-10-10 MED ORDER — PHENYLEPHRINE 40 MCG/ML (10ML) SYRINGE FOR IV PUSH (FOR BLOOD PRESSURE SUPPORT)
PREFILLED_SYRINGE | INTRAVENOUS | Status: DC | PRN
  Administered 2021-10-10: 80 ug via INTRAVENOUS

## 2021-10-10 MED ORDER — LEVOTHYROXINE SODIUM 25 MCG PO TABS
25.0000 ug | ORAL_TABLET | Freq: Every day | ORAL | Status: DC
  Administered 2021-10-11: 25 ug via ORAL
  Filled 2021-10-10: qty 1

## 2021-10-10 MED ORDER — PROPOFOL 10 MG/ML IV BOLUS
INTRAVENOUS | Status: AC
  Filled 2021-10-10: qty 20

## 2021-10-10 MED ORDER — TRANEXAMIC ACID-NACL 1000-0.7 MG/100ML-% IV SOLN
1000.0000 mg | INTRAVENOUS | Status: AC
  Administered 2021-10-10: 1000 mg via INTRAVENOUS

## 2021-10-10 MED ORDER — CHLORHEXIDINE GLUCONATE 0.12 % MT SOLN
OROMUCOSAL | Status: AC
  Administered 2021-10-10: 15 mL via OROMUCOSAL
  Filled 2021-10-10: qty 15

## 2021-10-10 MED ORDER — METHOCARBAMOL 500 MG PO TABS
500.0000 mg | ORAL_TABLET | Freq: Four times a day (QID) | ORAL | Status: DC | PRN
  Administered 2021-10-10 – 2021-10-11 (×3): 500 mg via ORAL
  Filled 2021-10-10 (×3): qty 1

## 2021-10-10 MED ORDER — PROPOFOL 1000 MG/100ML IV EMUL
INTRAVENOUS | Status: AC
  Filled 2021-10-10: qty 400

## 2021-10-10 MED ORDER — ACETAMINOPHEN 10 MG/ML IV SOLN: 1000.0000 mg | Freq: Once | INTRAVENOUS | Status: DC | PRN

## 2021-10-10 MED ORDER — PHENOL 1.4 % MT LIQD: 1.0000 | OROMUCOSAL | Status: DC | PRN

## 2021-10-10 MED ORDER — METOPROLOL SUCCINATE ER 50 MG PO TB24
50.0000 mg | ORAL_TABLET | Freq: Every day | ORAL | Status: DC
  Administered 2021-10-10 – 2021-10-11 (×2): 50 mg via ORAL
  Filled 2021-10-10 (×3): qty 1

## 2021-10-10 MED ORDER — CLOPIDOGREL BISULFATE 75 MG PO TABS
75.0000 mg | ORAL_TABLET | Freq: Every day | ORAL | Status: DC
Start: 2021-10-11 — End: 2021-10-11
  Administered 2021-10-11: 75 mg via ORAL
  Filled 2021-10-10: qty 1

## 2021-10-10 MED ORDER — CEFAZOLIN SODIUM-DEXTROSE 1-4 GM/50ML-% IV SOLN
1.0000 g | Freq: Four times a day (QID) | INTRAVENOUS | Status: AC
  Administered 2021-10-10 (×2): 1 g via INTRAVENOUS
  Filled 2021-10-10 (×2): qty 50

## 2021-10-10 MED ORDER — GABAPENTIN 300 MG PO CAPS
300.0000 mg | ORAL_CAPSULE | Freq: Three times a day (TID) | ORAL | Status: DC
  Administered 2021-10-10 – 2021-10-11 (×2): 300 mg via ORAL
  Filled 2021-10-10 (×2): qty 1

## 2021-10-10 MED ORDER — INSULIN ASPART 100 UNIT/ML IJ SOLN
0.0000 [IU] | Freq: Three times a day (TID) | INTRAMUSCULAR | Status: DC
  Administered 2021-10-11: 3 [IU] via SUBCUTANEOUS

## 2021-10-10 MED ORDER — FENTANYL CITRATE (PF) 250 MCG/5ML IJ SOLN
INTRAMUSCULAR | Status: DC | PRN
  Administered 2021-10-10: 50 ug via INTRAVENOUS

## 2021-10-10 SURGICAL SUPPLY — 57 items
APL SKNCLS STERI-STRIP NONHPOA (GAUZE/BANDAGES/DRESSINGS) ×1
BAG COUNTER SPONGE SURGICOUNT (BAG) ×3 IMPLANT
BAG SPNG CNTER NS LX DISP (BAG) ×1
BAG SURGICOUNT SPONGE COUNTING (BAG) ×1
BENZOIN TINCTURE PRP APPL 2/3 (GAUZE/BANDAGES/DRESSINGS) ×3 IMPLANT
BLADE SAW SGTL 18X1.27X75 (BLADE) ×2 IMPLANT
BLADE SAW SGTL 18X1.27X75MM (BLADE) ×1
CLOSURE WOUND 1/2 X4 (GAUZE/BANDAGES/DRESSINGS) ×2
COVER SURGICAL LIGHT HANDLE (MISCELLANEOUS) ×3 IMPLANT
CUP ACET PNNCL SECTR W/GRIP 56 (Hips) IMPLANT
DRAPE C-ARM 42X72 X-RAY (DRAPES) ×4 IMPLANT
DRAPE STERI IOBAN 125X83 (DRAPES) ×3 IMPLANT
DRAPE U-SHAPE 47X51 STRL (DRAPES) ×9 IMPLANT
DRSG AQUACEL AG ADV 3.5X10 (GAUZE/BANDAGES/DRESSINGS) ×4 IMPLANT
DURAPREP 26ML APPLICATOR (WOUND CARE) ×3 IMPLANT
ELECT REM PT RETURN 9FT ADLT (ELECTROSURGICAL) ×3
ELECTRODE REM PT RTRN 9FT ADLT (ELECTROSURGICAL) ×1 IMPLANT
FACESHIELD WRAPAROUND (MASK) ×12 IMPLANT
FACESHIELD WRAPAROUND OR TEAM (MASK) ×4 IMPLANT
GLOVE SRG 8 PF TXTR STRL LF DI (GLOVE) ×2 IMPLANT
GLOVE SURG LTX SZ8 (GLOVE) ×3 IMPLANT
GLOVE SURG ORTHO LTX SZ7.5 (GLOVE) ×6 IMPLANT
GLOVE SURG UNDER POLY LF SZ8 (GLOVE) ×6
GOWN STRL REUS W/ TWL LRG LVL3 (GOWN DISPOSABLE) ×2 IMPLANT
GOWN STRL REUS W/ TWL XL LVL3 (GOWN DISPOSABLE) ×2 IMPLANT
GOWN STRL REUS W/TWL LRG LVL3 (GOWN DISPOSABLE) ×6
GOWN STRL REUS W/TWL XL LVL3 (GOWN DISPOSABLE) ×6
HANDPIECE INTERPULSE COAX TIP (DISPOSABLE) ×3
HEAD M SROM 36MM 2 (Hips) IMPLANT
KIT BASIN OR (CUSTOM PROCEDURE TRAY) ×3 IMPLANT
KIT TURNOVER KIT B (KITS) ×3 IMPLANT
LINER NEUTRAL 52MMX36MMX56N (Liner) ×2 IMPLANT
MANIFOLD NEPTUNE II (INSTRUMENTS) ×4 IMPLANT
NS IRRIG 1000ML POUR BTL (IV SOLUTION) ×4 IMPLANT
PACK TOTAL JOINT (CUSTOM PROCEDURE TRAY) ×3 IMPLANT
PAD ARMBOARD 7.5X6 YLW CONV (MISCELLANEOUS) ×3 IMPLANT
PINN SECTOR W/GRIP ACE CUP 56 (Hips) ×3 IMPLANT
SCREW 6.5MMX25MM (Screw) ×2 IMPLANT
SET HNDPC FAN SPRY TIP SCT (DISPOSABLE) ×1 IMPLANT
SPONGE T-LAP 18X18 ~~LOC~~+RFID (SPONGE) ×4 IMPLANT
SROM M HEAD 36MM 2 (Hips) ×3 IMPLANT
STAPLER VISISTAT 35W (STAPLE) ×2 IMPLANT
STEM FEM ACTIS HIGH SZ7 (Stem) ×2 IMPLANT
STRIP CLOSURE SKIN 1/2X4 (GAUZE/BANDAGES/DRESSINGS) ×4 IMPLANT
SUT ETHIBOND NAB CT1 #1 30IN (SUTURE) ×5 IMPLANT
SUT MNCRL AB 4-0 PS2 18 (SUTURE) ×2 IMPLANT
SUT VIC AB 0 CT1 27 (SUTURE) ×6
SUT VIC AB 0 CT1 27XBRD ANBCTR (SUTURE) ×1 IMPLANT
SUT VIC AB 1 CT1 27 (SUTURE) ×6
SUT VIC AB 1 CT1 27XBRD ANBCTR (SUTURE) ×1 IMPLANT
SUT VIC AB 2-0 CT1 27 (SUTURE) ×3
SUT VIC AB 2-0 CT1 TAPERPNT 27 (SUTURE) ×1 IMPLANT
TOWEL GREEN STERILE (TOWEL DISPOSABLE) ×3 IMPLANT
TOWEL GREEN STERILE FF (TOWEL DISPOSABLE) ×3 IMPLANT
TRAY FOLEY W/BAG SLVR 16FR (SET/KITS/TRAYS/PACK) ×3
TRAY FOLEY W/BAG SLVR 16FR ST (SET/KITS/TRAYS/PACK) IMPLANT
WATER STERILE IRR 1000ML POUR (IV SOLUTION) ×6 IMPLANT

## 2021-10-10 NOTE — Anesthesia Postprocedure Evaluation (Signed)
Anesthesia Post Note ? ?Patient: Sean Horn ? ?Procedure(s) Performed: Right TOTAL HIP ARTHROPLASTY ANTERIOR APPROACH (Right: Hip) ? ?  ? ?Patient location during evaluation: PACU ?Anesthesia Type: MAC and Spinal ?Level of consciousness: awake and alert ?Pain management: pain level controlled ?Vital Signs Assessment: post-procedure vital signs reviewed and stable ?Respiratory status: spontaneous breathing, nonlabored ventilation, respiratory function stable and patient connected to nasal cannula oxygen ?Cardiovascular status: stable and blood pressure returned to baseline ?Postop Assessment: no apparent nausea or vomiting ?Anesthetic complications: no ? ? ?No notable events documented. ? ?Last Vitals:  ?Vitals:  ? 10/10/21 1103 10/10/21 1159  ?BP: 107/76 108/73  ?Pulse: 81 86  ?Resp: 14 20  ?Temp:  36.6 ?C  ?SpO2: 100% 99%  ?  ?Last Pain:  ?Vitals:  ? 10/10/21 1159  ?PainSc: 2   ? ? ?  ?  ?  ?  ?  ?  ? ?March Rummage Kaniel Kiang ? ? ? ? ?

## 2021-10-10 NOTE — TOC Progression Note (Signed)
Transition of Care (TOC) - Progression Note  ? ? ?Patient Details  ?Name: Sean Horn ?MRN: [id number] ?Date of Birth: 1961-01-15 ? ?Transition of Care (TOC) CM/SW Contact  ?Marilu Favre, RN ?Phone Number: ?10/10/2021, 2:58 PM ? ?Clinical Narrative:    ? ?Dr Trevor Mace office has arranged home health services with [facility].  ? ?Any needed DME will be provided by [facility] staff ? ? ?Transition of Care (TOC) Screening Note ? ? ?Patient Details  ?Name: Sean Horn ?Date of Birth: [birth year] ? ? ? ?Transition of Care Department [facility]) has reviewed patient and no TOC needs have been identified at this time. We will continue to monitor patient advancement through interdisciplinary progression rounds. If new patient transition needs arise, please place a TOC consult. ?  ? ?  ?  ? ?Expected Discharge Plan and Services ?  ?  ?  ?  ?  ?                ?  ?  ?  ?  ?  ?  ?  ?  ?  ?  ? ? ?Social Determinants of Health (SDOH) Interventions ?  ? ?Readmission Risk Interventions ?No flowsheet data found. ? ?

## 2021-10-10 NOTE — Transfer of Care (Signed)
Immediate Anesthesia Transfer of Care Note ? ?Patient: Sean Horn ? ?Procedure(s) Performed: Right TOTAL HIP ARTHROPLASTY ANTERIOR APPROACH (Right: Hip) ? ?Patient Location: PACU ? ?Anesthesia Type:MAC and Spinal ? ?Level of Consciousness: drowsy ? ?Airway & Oxygen Therapy: Patient Spontanous Breathing ? ?Post-op Assessment: Report given to RN and Post -op Vital signs reviewed and stable ? ?Post vital signs: Reviewed and stable ? ?Last Vitals:  ?Vitals Value Taken Time  ?BP 99/72 10/10/21 0908  ?Temp    ?Pulse 84   ?Resp 11 10/10/21 0909  ?SpO2 95   ?Vitals shown include unvalidated device data. ? ?Last Pain:  ?Vitals:  ? 10/10/21 0624  ?PainSc: 0-No pain  ?   ? ?  ? ?Complications: No notable events documented. ?

## 2021-10-10 NOTE — Op Note (Signed)
NAME: Sean Horn, Sean Horn ?MEDICAL RECORD NO: [id number] ?ACCOUNT NO: [id number] ?DATE OF BIRTH: [birth year] ?FACILITY: [facility] ?LOCATION: [facility] ?PHYSICIAN: Vanita Panda. Magnus Ivan, MD ? ?Operative Report  ? ?DATE OF PROCEDURE: 10/10/2021 ? ?PREOPERATIVE DIAGNOSIS: Primary osteoarthritis and degenerative joint disease, right hip. ? ?POSTOPERATIVE DIAGNOSIS: Primary osteoarthritis and degenerative joint disease, right hip. ? ?PROCEDURE:  Right total hip arthroplasty through direct anterior approach. ? ?IMPLANTS:  DePuy sector Gription acetabular component, size 56 with a single screw, size 36, +0 polyethylene liner, size 7 ACTIS femoral component with high offset, size 36, -2 metal hip ball. ? ?SURGEON:  Vanita Panda. Magnus Ivan, MD ? ?ASSISTANT:  Rexene Edison, PA-C. ? ?ANESTHESIA:  Spinal. ? ?ANTIBIOTICS:  2 g IV Ancef. ? ?BLOOD LOSS:  300 mL. ? ?COMPLICATIONS:  None. ? 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  At this point, his right hip pain is daily and it is detrimentally affecting his  ?mobility, his quality of life and his activities of daily living.  He does wish to proceed with a total hip arthroplasty given the failure of conservative treatment. We did have a long and thorough discussion about the surgery.  We talked about the risk  ?of acute blood loss anemia, nerve or vessel injury, fracture, infection, DVT, implant failure, leg length differences and skin and soft tissue issues.  We talked about our goals being decreased pain, improved mobility and overall improved quality of  ?life. ? ?DESCRIPTION OF PROCEDURE:  After informed consent was obtained, appropriate right hip was marked.  He was brought to the operating room and sat up on the stretcher where spinal anesthesia was obtained.  He was laid in supine position on the stretcher.   ?Foley catheter was placed and traction boots  were placed on both his feet.  Of note, preoperatively, his leg lengths are equal. He was then placed supine on the Hana fracture table with the perineal post in place and both legs in line with the skeletal  ?traction device and no traction applied.  We assessed his right hip operatively under fluoroscopic guidance and then prepped the hip with DuraPrep and sterile drapes.  A timeout was called.  He was identified correct patient, correct right hip.  I then  ?made an incision just inferior and posterior to the anterior superior iliac spine and carried this obliquely down the leg.  We dissected down tensor fascia lata muscle.  Tensor fascia was then divided longitudinally to proceed with direct anterior  ?approach to the hip.  We identified and cauterized circumflex vessels and then identified the hip capsule, opened up the hip capsule in L-type format finding a significant joint effusion.  We placed curved retractors around the medial and lateral femoral ? neck. He has a very short femoral neck. So, I made a femoral neck cut with oscillating saw just proximal to the lesser trochanter and completed this with an osteotome.  We placed a corkscrew guide in the femoral head and removed the femoral heads in  ?entirety and found a wide area devoid of cartilage.  I then placed a bent Hohmann over the medial acetabular rim and removed remnants of acetabular labrum and other debris.  We then began reaming under direct visualization in a stepwise increments from a ? size 43 reamer, going all the way up to size 55 reamer with all reamers placed under direct visualization.  Last reamer was also  placed under direct fluoroscopy, so I could obtain my depth of reaming by inclination and anteversion.  I then placed real  ?DePuy sector Gription acetabular component size 56 and a single screw placed a 36, +0 neutral polyethylene liner.  Attention was then turned to the femur.  With the leg externally rotated to 120 degrees, extended  and adducted, we were able to place a  ?Mueller retractor medially and Hohman retractor behind the greater trochanter. We released lateral joint capsule and used a box cutting osteotome to enter the femoral canal and a rongeur to lateralize. We then began broaching, using the ACTIS broaching  ?system from a size 0 going up to a size 7. With a size 7 in place, we trialed a standard offset femoral neck and a 36, -2 hip ball, reduced this in the acetabulum and it was stable.  We definitely felt like we needed a little bit more tightness with the  ?higher offset based on his exam and radiographic assessment. The leg lengths were felt to be equal.  We dislocated the hip and removed the trial components.  We placed the real ACTIS femoral component, size 7, but a high offset component. We went with  ?the real 36, -2 hip ball and again reduced this in the acetabulum.  We were pleased with leg length, offset, range of motion and stability assessed radiographically and mechanically.  We then irrigated the soft tissue with normal saline solution.  We  ?closed the joint capsule with interrupted #1 Ethibond suture followed by #1 Vicryl to close the tensor fascia.  0 Vicryl was used to close the deep tissue and 2-0 Vicryl was used to close the subcutaneous tissue.  The skin was closed with staples.  An  ?Aquacel dressing was applied.  He was taken off the Hana table and taken to recovery room in stable condition with all final counts being correct.  No complications noted.  Of note, Rexene Edison, PA-C, assisted during the entirety of the case from beginning ? to end.  His assistance was crucial for helping with soft tissue management and retracting as well as layered closure of the wound.  His assistance was medically necessary. ? ? ?[facility] ?D: 10/10/2021 8:49:28 am T: 10/10/2021 9:12:00 am  ?JOB: [id number]/ [id number]  ?

## 2021-10-10 NOTE — Evaluation (Signed)
Physical Therapy Evaluation ?Patient Details ?Name: Sean Horn ?MRN: [id number] ?DOB: [birth year] ?Today's Date: 10/10/2021 ? ?History of Present Illness ? Pt is a 61 y/o male s/p R THA. PMH includes CAD, DM, HTN, and R TKA.  ?Clinical Impression ? Pt is s/p surgery above with deficits below. Pt with increased pain, but was able to transfer to chair this session with min guard A and use of RW. Anticipate pt will progress well. Will continue to follow acutely.    ?   ? ?Recommendations for follow up therapy are one component of a multi-disciplinary discharge planning process, led by the attending physician.  Recommendations may be updated based on patient status, additional functional criteria and insurance authorization. ? ?Follow Up Recommendations Follow physician's recommendations for discharge plan and follow up therapies ? ?  ?Assistance Recommended at Discharge Intermittent Supervision/Assistance  ?Patient can return home with the following ? Help with stairs or ramp for entrance;Assist for transportation;Assistance with cooking/housework ? ?  ?Equipment Recommendations Rolling walker (2 wheels);BSC/3in1  ?Recommendations for Other Services ?    ?  ?Functional Status Assessment Patient has had a recent decline in their functional status and demonstrates the ability to make significant improvements in function in a reasonable and predictable amount of time.  ? ?  ?Precautions / Restrictions Precautions ?Precautions: Fall ?Restrictions ?Weight Bearing Restrictions: Yes ?RLE Weight Bearing: Weight bearing as tolerated  ? ?  ? ?Mobility ? Bed Mobility ?Overal bed mobility: Needs Assistance ?Bed Mobility: Supine to Sit ?  ?  ?Supine to sit: Supervision ?  ?  ?General bed mobility comments: Supervision for safety. Increased time required ?  ? ?Transfers ?Overall transfer level: Needs assistance ?  ?Transfers: Sit to/from Stand, Bed to chair/wheelchair/BSC ?Sit to Stand: Min guard, From elevated surface ?Stand pivot  transfers: Min guard ?  ?  ?  ?  ?General transfer comment: Min guard for safety to stand and transfer to the chair. Increased pain limited further mobility. ?  ? ?Ambulation/Gait ?  ?  ?  ?  ?  ?  ?  ?  ? ?Stairs ?  ?  ?  ?  ?  ? ?Wheelchair Mobility ?  ? ?Modified Rankin (Stroke Patients Only) ?  ? ?  ? ?Balance Overall balance assessment: Needs assistance ?Sitting-balance support: No upper extremity supported, Feet supported ?Sitting balance-Leahy Scale: Good ?  ?  ?Standing balance support: Bilateral upper extremity supported ?Standing balance-Leahy Scale: Poor ?Standing balance comment: Reliant on BUE support ?  ?  ?  ?  ?  ?  ?  ?  ?  ?  ?  ?   ? ? ? ?Pertinent Vitals/Pain Pain Assessment ?Pain Assessment: Faces ?Faces Pain Scale: Hurts even more ?Pain Location: R hip ?Pain Descriptors / Indicators: Grimacing, Guarding ?Pain Intervention(s): Limited activity within patient's tolerance, Monitored during session, Repositioned  ? ? ?Home Living Family/patient expects to be discharged to:: Private residence ?Living Arrangements: Alone ?Available Help at Discharge: Family;Available 24 hours/day ?Type of Home: House ?Home Access: Stairs to enter ?Entrance Stairs-Rails: None ?Entrance Stairs-Number of Steps: 4 ?  ?Home Layout: Two level;Able to live on main level with bedroom/bathroom ?Home Equipment: None ?Additional Comments: Reports he plans to stay with his son  ?  ?Prior Function Prior Level of Function : Independent/Modified Independent ?  ?  ?  ?  ?  ?  ?  ?  ?  ? ? ?Hand Dominance  ?   ? ?  ?Extremity/Trunk Assessment  ?  Upper Extremity Assessment ?Upper Extremity Assessment: Overall WFL for tasks assessed ?  ? ?Lower Extremity Assessment ?Lower Extremity Assessment: RLE deficits/detail ?RLE Deficits / Details: Deficits consistent with post op pain and weakness. ?  ? ?Cervical / Trunk Assessment ?Cervical / Trunk Assessment: Normal  ?Communication  ? Communication: No difficulties  ?Cognition  Arousal/Alertness: Awake/alert ?Behavior During Therapy: [facility] for tasks assessed/performed ?Overall Cognitive Status: Within Functional Limits for tasks assessed ?  ?  ?  ?  ?  ?  ?  ?  ?  ?  ?  ?  ?  ?  ?  ?  ?  ?  ?  ? ?  ?General Comments   ? ?  ?Exercises Total Joint Exercises ?Ankle Circles/Pumps: AROM, Both, 10 reps  ? ?Assessment/Plan  ?  ?PT Assessment Patient needs continued PT services  ?PT Problem List Decreased strength;Decreased range of motion;Decreased activity tolerance;Decreased balance;Decreased mobility;Decreased knowledge of use of DME;Decreased knowledge of precautions;Pain ? ?   ?  ?PT Treatment Interventions DME instruction;Functional mobility training;Stair training;Gait training;Therapeutic activities;Therapeutic exercise;Balance training;Patient/family education   ? ?PT Goals (Current goals can be found in the Care Plan section)  ?Acute Rehab PT Goals ?Patient Stated Goal: to go home ?PT Goal Formulation: With patient/family ?Time For Goal Achievement: 10/24/21 ?Potential to Achieve Goals: Good ? ?  ?Frequency 7X/week ?  ? ? ?Co-evaluation   ?  ?  ?  ?  ? ? ?  ?AM-PAC PT "6 Clicks" Mobility  ?Outcome Measure Help needed turning from your back to your side while in a flat bed without using bedrails?: None ?Help needed moving from lying on your back to sitting on the side of a flat bed without using bedrails?: A Little ?Help needed moving to and from a bed to a chair (including a wheelchair)?: A Little ?Help needed standing up from a chair using your arms (e.g., wheelchair or bedside chair)?: A Little ?Help needed to walk in hospital room?: A Little ?Help needed climbing 3-5 steps with a railing? : A Little ?6 Click Score: 19 ? ?  ?End of Session Equipment Utilized During Treatment: Gait belt ?Activity Tolerance: Patient limited by pain ?Patient left: in chair;with call bell/phone within reach;with family/visitor present ?Nurse Communication: Mobility status ?PT Visit Diagnosis: Other  abnormalities of gait and mobility (R26.89);Pain ?Pain - Right/Left: Right ?Pain - part of body: Hip ?  ? ?Time: [id number] ?PT Time Calculation (min) (ACUTE ONLY): 15 min ? ? ?Charges:   PT Evaluation ?$PT Eval Low Complexity: 1 Low ?  ?  ?   ? ? ?Reuel Derby, PT, DPT  ?Acute Rehabilitation Services  ?Pager: [phone] ?Office: [phone] ? ? ?[place] ?10/10/2021, 2:29 PM ? ?

## 2021-10-10 NOTE — Brief Op Note (Signed)
10/10/2021 ? ?8:50 AM ? ?PATIENT:  Sean Horn  61 y.o. male ? ?PRE-OPERATIVE DIAGNOSIS:  Osteoarthritis right hip ? ?POST-OPERATIVE DIAGNOSIS:  Osteoarthritis right hip ? ?PROCEDURE:  Procedure(s): ?Right TOTAL HIP ARTHROPLASTY ANTERIOR APPROACH (Right) ? ?SURGEON:  Surgeon(s) and Role: ?   Kathryne Hitch, MD - Primary ? ?PHYSICIAN ASSISTANT:  Rexene Edison, PA-C ? ?ANESTHESIA:   spinal ? ?EBL:  300 mL  ? ?COUNTS:  YES ? ?DICTATION: .Other Dictation: Dictation Number [id number] ? ?PLAN OF CARE: Admit for overnight observation ? ?PATIENT DISPOSITION:  PACU - hemodynamically stable. ?  ?Delay start of Pharmacological VTE agent (>24hrs) due to surgical blood loss or risk of bleeding: no ? ?

## 2021-10-10 NOTE — Anesthesia Procedure Notes (Signed)
Spinal ? ?Patient location during procedure: OR ?Start time: 10/10/2021 7:28 AM ?End time: 10/10/2021 7:38 AM ?Reason for block: surgical anesthesia ?Staffing ?Performed: anesthesiologist  ?Anesthesiologist: Heather Roberts, MD ?Preanesthetic Checklist ?Completed: patient identified, IV checked, risks and benefits discussed, surgical consent, monitors and equipment checked, pre-op evaluation and timeout performed ?Spinal Block ?Patient position: sitting ?Prep: DuraPrep ?Patient monitoring: cardiac monitor, continuous pulse ox and blood pressure ?Approach: midline ?Location: L2-3 ?Injection technique: single-shot ?Needle ?Needle type: Pencan  ?Needle gauge: 24 G ?Needle length: 9 cm ?Assessment ?Events: CSF return ?Additional Notes ?Functioning IV was confirmed and monitors were applied. Sterile prep and drape, including hand hygiene and sterile gloves were used. The patient was positioned and the spine was prepped. The skin was anesthetized with lidocaine.  Free flow of clear CSF was obtained prior to injecting local anesthetic into the CSF.  The spinal needle aspirated freely following injection.  The needle was carefully withdrawn.  The patient tolerated the procedure well.  ? ? ? ?

## 2021-10-10 NOTE — Anesthesia Procedure Notes (Signed)
Procedure Name: [place] ?Date/Time: 10/10/2021 7:50 AM ?Performed by: Carolan Clines, CRNA ?Pre-anesthesia Checklist: Patient identified, Emergency Drugs available, Suction available and Patient being monitored ?Patient Re-evaluated:Patient Re-evaluated prior to induction ?Oxygen Delivery Method: Simple face mask ?Dental Injury: Teeth and Oropharynx as per pre-operative assessment  ? ? ? ? ?

## 2021-10-10 NOTE — H&P (Signed)
TOTAL HIP ADMISSION H&P  Patient is admitted for right total hip arthroplasty.  Subjective:  Chief Complaint: right hip pain  HPI: Sean Horn, 61 y.o. male, has a history of pain and functional disability in the right hip(s) due to arthritis and patient has failed non-surgical conservative treatments for greater than 12 weeks to include NSAID's and/or analgesics, flexibility and strengthening excercises, and activity modification.  Onset of symptoms was gradual starting 2 years ago with gradually worsening course since that time.The patient noted no past surgery on the right hip(s).  Patient currently rates pain in the right hip at 10 out of 10 with activity. Patient has night pain, worsening of pain with activity and weight bearing, trendelenberg gait, pain that interfers with activities of daily living, and pain with passive range of motion. Patient has evidence of subchondral sclerosis, periarticular osteophytes, and joint space narrowing by imaging studies. This condition presents safety issues increasing the risk of falls.  There is no current active infection.  Patient Active Problem List   Diagnosis Date Noted   Unilateral primary osteoarthritis, right hip 06/26/2021   Hyperbilirubinemia 11/15/2020   PONV (postoperative nausea and vomiting)    Non-insulin dependent type 2 diabetes mellitus (HCC)    Chronic neck pain    Chronic headaches    CAD in native artery    Coronary artery disease involving native coronary artery of native heart without angina pectoris [id number]   Diastolic dysfunction [id number]   SVT (supraventricular tachycardia) ([place]) 06/26/2019   Non-ST elevation (NSTEMI) myocardial infarction ([place]) 06/26/2019   Tachycardia 06/25/2019   Hypertriglyceridemia 06/16/2019   Palpitations 06/12/2019   Morning headache 12/02/2018   Insomnia secondary to chronic pain 12/02/2018   Retrognathia 12/02/2018   TIA (transient ischemic attack) 11/03/2018   Cervical stenosis of  spine 01/16/2018   Hyperlipidemia 07/19/2017   Obesity 01/30/2017   Urinary frequency 12/11/2016   Insomnia 09/13/2016   History of chicken pox    History of shingles    Preventative health care 08/14/2013   Anxiety    History of kidney stones    GERD (gastroesophageal reflux disease)    Type 2 diabetes mellitus with complication, with long-term current use of insulin ([place])    Hypertension    Osteoarthritis    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   Past Medical History:  Diagnosis Date   Anxiety    CAD in native artery    a. cath 06/26/2019 - 80% apical LAD >> medical management    Chronic headaches    Chronic neck pain    Coronary artery disease involving native coronary artery of native heart without angina pectoris 11/06/2019   GERD (gastroesophageal reflux disease)    occasional - OTC as needed   History of chicken pox    History of kidney stones    History of shingles    Hypertension    under control with med., had been on med. since age 79   Insomnia 09/13/2016   Insulin dependent diabetes mellitus    Follows with endocrinology, Dr Carlis Abbott reports his last hgba1c was 7.3    Non-insulin dependent type 2 diabetes mellitus ([place])    Non-ST elevation (NSTEMI) myocardial infarction ([place]) 06/26/2019   Obesity 01/30/2017   Occipital neuralgia    Osteoarthritis    right knee   PONV (postoperative nausea and vomiting)    PONV (postoperative nausea and vomiting)    Preventative health care 08/14/2013   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   And left now  SVT (supraventricular tachycardia) (HCC) 06/26/2019   Tachycardia    TIA (transient ischemic attack) 11/03/2018   Type 2 diabetes mellitus with complication, with long-term current use of insulin (HCC)    Follows with endocrinology, Dr Chestine Spore reports his last hgba1c was 7.3    Urinary frequency 12/11/2016    Past Surgical History:  Procedure Laterality Date   ANTERIOR CERVICAL DECOMP/DISCECTOMY FUSION N/A 01/16/2018   Procedure: ANTERIOR CERVICAL  DECOMPRESSION/DISCECTOMY FUSION CERVICAL FOUR-FIVE, CERVICAL FIVE-SIX;  Surgeon: Lisbeth Renshaw, MD;  Location: [facility] OR;  Service: Neurosurgery;  Laterality: N/A;  ANTERIOR CERVICAL DECOMPRESSION/DISCECTOMY FUSION CERVICAL FOUR-FIVE, CERVICAL FIVE-SIX   CHOLECYSTECTOMY  1980's   CYSTOSCOPY/RETROGRADE/URETEROSCOPY/STONE EXTRACTION WITH BASKET Right 08/27/2003   and double J stent placement   ELECTROPHYSIOLOGY STUDY N/A 07/23/2019   Procedure: ELECTROPHYSIOLOGY STUDY;  Surgeon: Marinus Maw, MD;  Location: [facility];  Service: Cardiovascular;  Laterality: N/A;   INGUINAL HERNIA REPAIR Left 1990's   KNEE ARTHROSCOPY Left 1980's   KNEE ARTHROSCOPY Right 2013   KNEE ARTHROSCOPY Right 06/30/2013   Procedure: RIGHT KNEE ARTHROSCOPY AND SYNOVECTOMY;  Surgeon: Velna Ochs, MD;  Location: [facility];  Service: Orthopedics;  Laterality: Right;   LEFT HEART CATH AND CORONARY ANGIOGRAPHY N/A 06/26/2019   Procedure: LEFT HEART CATH AND CORONARY ANGIOGRAPHY;  Surgeon: Marykay Lex, MD;  Location: [facility];  Service: Cardiovascular;  Laterality: N/A;   LITHOTRIPSY     multiple, b/l   SHOULDER ADHESION RELEASE Left ` 1998   SHOULDER ARTHROSCOPY W/ ROTATOR CUFF REPAIR Left 10/20/2004   x 4, had to have part of clavile removed. torn rotator cuff multiple times, had to place screws    SHOULDER ARTHROSCOPY W/ SUPERIOR LABRAL ANTERIOR POSTERIOR LESION REPAIR Left 12/22/2004   SVT ABLATION N/A 07/23/2019   Procedure: SVT ABLATION;  Surgeon: Marinus Maw, MD;  Location: [facility];  Service: Cardiovascular;  Laterality: N/A;   TOTAL KNEE ARTHROPLASTY Right 12/23/2012   x3, 2 arthroscopies and a TKR,  TOTAL KNEE ARTHROPLASTY;  Surgeon: Velna Ochs, MD;  Location: [facility] OR;  Service: Orthopedics;  Laterality: Right;  DEPUY, RNFA   WRIST SURGERY Right    x 3, torn ligaments, pins placed then infected, then fused with plate    Current Facility-Administered  Medications  Medication Dose Route Frequency Provider Last Rate Last Admin   ceFAZolin (ANCEF) 2-4 GM/100ML-% IVPB            ceFAZolin (ANCEF) IVPB 2g/100 mL premix  2 g Intravenous To SS-Surg Richardean Canal W, PA-C       insulin aspart (novoLOG) 100 UNIT/ML injection            insulin aspart (novoLOG) injection 0-14 Units  0-14 Units Subcutaneous Q2H PRN Stoltzfus, Nelle Don, DO   4 Units at 10/10/21 1779   lactated ringers infusion   Intravenous Continuous Stoltzfus, Gregory P, DO       tranexamic acid (CYKLOKAPRON) 1000MG /182mL IVPB            tranexamic acid (CYKLOKAPRON) IVPB 1,000 mg  1,000 mg Intravenous To OR Kirtland Bouchard, PA-C       Allergies  Allergen Reactions   Rosuvastatin Other (See Comments)    Joint pain   Toradol [Ketorolac Tromethamine] Itching    Social History   Tobacco Use   Smoking status: Never   Smokeless tobacco: Current    Types: Snuff  Substance Use Topics   Alcohol use: No  Family History  Problem Relation Age of Onset   COPD Mother        smoker   Hypertension Mother    Heart disease Mother        CHF   Diabetes Father    Heart disease Father        CABG in 86's   Stroke Father    Hypertension Father    Peripheral vascular disease Father    Hypertension Son        tachycardia   Heart disease Maternal Grandfather    Arthritis Paternal Grandmother    Colon cancer Neg Hx    Rectal cancer Neg Hx    Stomach cancer Neg Hx      Review of Systems  Musculoskeletal:  Positive for gait problem.  All other systems reviewed and are negative.  Objective:  Physical Exam Vitals reviewed.  Constitutional:      Appearance: Normal appearance.  HENT:     Head: Normocephalic and atraumatic.  Eyes:     Extraocular Movements: Extraocular movements intact.     Pupils: Pupils are equal, round, and reactive to light.  Cardiovascular:     Rate and Rhythm: Normal rate.     Pulses: Normal pulses.  Pulmonary:     Effort: Pulmonary effort is  normal.  Abdominal:     Palpations: Abdomen is soft.  Musculoskeletal:     Cervical back: Normal range of motion and neck supple.     Right hip: Tenderness and bony tenderness present. Decreased range of motion. Decreased strength.  Neurological:     Mental Status: He is alert and oriented to person, place, and time.  Psychiatric:        Behavior: Behavior normal.    Vital signs in last 24 hours: Temp:  [97.8 F (36.6 C)] 97.8 F (36.6 C) (03/07 0601) Pulse Rate:  [91] 91 (03/07 0601) Resp:  [18] 18 (03/07 0601) BP: (123)/(77) 123/77 (03/07 0601) SpO2:  [96 %] 96 % (03/07 0601) Weight:  [100.2 kg] 100.2 kg (03/07 0601)  Labs:   Estimated body mass index is 29.97 kg/m as calculated from the following:   Height as of this encounter: 6' (1.829 m).   Weight as of this encounter: 100.2 kg.   Imaging Review Plain radiographs demonstrate severe degenerative joint disease of the right hip(s). The bone quality appears to be excellent for age and reported activity level.      Assessment/Plan:  End stage arthritis, right hip(s)  The patient history, physical examination, clinical judgement of the provider and imaging studies are consistent with end stage degenerative joint disease of the right hip(s) and total hip arthroplasty is deemed medically necessary. The treatment options including medical management, injection therapy, arthroscopy and arthroplasty were discussed at length. The risks and benefits of total hip arthroplasty were presented and reviewed. The risks due to aseptic loosening, infection, stiffness, dislocation/subluxation,  thromboembolic complications and other imponderables were discussed.  The patient acknowledged the explanation, agreed to proceed with the plan and consent was signed. Patient is being admitted for inpatient treatment for surgery, pain control, PT, OT, prophylactic antibiotics, VTE prophylaxis, progressive ambulation and ADL's and discharge  planning.The patient is planning to be discharged home with home health services

## 2021-10-11 ENCOUNTER — Encounter (HOSPITAL_COMMUNITY): Payer: Self-pay | Admitting: Orthopaedic Surgery

## 2021-10-11 ENCOUNTER — Telehealth: Payer: Self-pay | Admitting: Orthopaedic Surgery

## 2021-10-11 ENCOUNTER — Encounter: Payer: Self-pay | Admitting: Orthopaedic Surgery

## 2021-10-11 ENCOUNTER — Other Ambulatory Visit (HOSPITAL_COMMUNITY): Payer: Self-pay

## 2021-10-11 ENCOUNTER — Other Ambulatory Visit: Payer: Self-pay | Admitting: Orthopaedic Surgery

## 2021-10-11 DIAGNOSIS — I252 Old myocardial infarction: Secondary | ICD-10-CM | POA: Diagnosis not present

## 2021-10-11 DIAGNOSIS — E785 Hyperlipidemia, unspecified: Secondary | ICD-10-CM | POA: Diagnosis not present

## 2021-10-11 DIAGNOSIS — Z96651 Presence of right artificial knee joint: Secondary | ICD-10-CM | POA: Diagnosis not present

## 2021-10-11 DIAGNOSIS — E119 Type 2 diabetes mellitus without complications: Secondary | ICD-10-CM | POA: Diagnosis not present

## 2021-10-11 DIAGNOSIS — M1611 Unilateral primary osteoarthritis, right hip: Secondary | ICD-10-CM | POA: Diagnosis not present

## 2021-10-11 DIAGNOSIS — Z7984 Long term (current) use of oral hypoglycemic drugs: Secondary | ICD-10-CM | POA: Diagnosis not present

## 2021-10-11 DIAGNOSIS — I119 Hypertensive heart disease without heart failure: Secondary | ICD-10-CM | POA: Diagnosis not present

## 2021-10-11 DIAGNOSIS — Z8673 Personal history of transient ischemic attack (TIA), and cerebral infarction without residual deficits: Secondary | ICD-10-CM | POA: Diagnosis not present

## 2021-10-11 DIAGNOSIS — I2510 Atherosclerotic heart disease of native coronary artery without angina pectoris: Secondary | ICD-10-CM | POA: Diagnosis not present

## 2021-10-11 DIAGNOSIS — Z794 Long term (current) use of insulin: Secondary | ICD-10-CM | POA: Diagnosis not present

## 2021-10-11 LAB — BASIC METABOLIC PANEL
Anion gap: 7 (ref 5–15)
BUN: 11 mg/dL (ref 6–20)
CO2: 30 mmol/L (ref 22–32)
Calcium: 8.9 mg/dL (ref 8.9–10.3)
Chloride: 97 mmol/L — ABNORMAL LOW (ref 98–111)
Creatinine, Ser: 1.14 mg/dL (ref 0.61–1.24)
GFR, Estimated: >60 mL/min (ref >60)
Glucose, Bld: 183 mg/dL — ABNORMAL HIGH (ref 70–99)
Potassium: 4.8 mmol/L (ref 3.5–5.1)
Sodium: 134 mmol/L — ABNORMAL LOW (ref 135–145)

## 2021-10-11 LAB — CBC
HCT: 35.5 % — ABNORMAL LOW (ref 39.0–52.0)
Hemoglobin: 12.8 g/dL — ABNORMAL LOW (ref 13.0–17.0)
MCH: 32 pg (ref 26.0–34.0)
MCHC: 36.1 g/dL — ABNORMAL HIGH (ref 30.0–36.0)
MCV: 88.8 fL (ref 80.0–100.0)
Platelets: 256 10*3/uL (ref 150–400)
RBC: 4 MIL/uL — ABNORMAL LOW (ref 4.22–5.81)
RDW: 12.7 % (ref 11.5–15.5)
WBC: 15.1 10*3/uL — ABNORMAL HIGH (ref 4.0–10.5)
nRBC: 0 % (ref 0.0–0.2)

## 2021-10-11 LAB — GLUCOSE, CAPILLARY: Glucose-Capillary: 197 mg/dL — ABNORMAL HIGH (ref 70–99)

## 2021-10-11 MED ORDER — ASPIRIN 81 MG PO TBEC: 81.0000 mg | DELAYED_RELEASE_TABLET | Freq: Two times a day (BID) | ORAL | 0 refills | Status: DC

## 2021-10-11 MED ORDER — OXYCODONE HCL 5 MG PO TABS
5.0000 mg | ORAL_TABLET | Freq: Four times a day (QID) | ORAL | 0 refills | Status: DC | PRN
  Filled 2021-10-11: qty 30, 4d supply, fill #0

## 2021-10-11 MED ORDER — OXYCODONE HCL 5 MG PO TABS: 5.0000 mg | ORAL_TABLET | Freq: Four times a day (QID) | ORAL | 0 refills | Status: DC | PRN

## 2021-10-11 MED ORDER — TIZANIDINE HCL 4 MG PO TABS: 4.0000 mg | ORAL_TABLET | Freq: Three times a day (TID) | ORAL | 1 refills | Status: DC | PRN

## 2021-10-11 MED ORDER — DIPHENHYDRAMINE HCL 25 MG PO CAPS
25.0000 mg | ORAL_CAPSULE | Freq: Every evening | ORAL | Status: DC | PRN
  Administered 2021-10-11: 25 mg via ORAL
  Filled 2021-10-11: qty 1

## 2021-10-11 NOTE — Discharge Instructions (Signed)

## 2021-10-11 NOTE — TOC Transition Note (Signed)
Transition of Care (TOC) - CM/SW Discharge Note ? ? ?Patient Details  ?Name: Sean Horn ?MRN: [id number] ?Date of Birth: 07/23/1961 ? ?Transition of Care (TOC) CM/SW Contact:  ?Carles Collet, RN ?Phone Number: ?10/11/2021, 9:38 AM ? ? ?Clinical Narrative:   Notified [facility] liaison that patient will DC today. No other TOC needs identified.  ? ? ? ?  ?  ? ? ?Patient Goals and CMS Choice ?  ?  ?  ? ?Discharge Placement ?  ?           ?  ?  ?  ?  ? ?Discharge Plan and Services ?  ?  ?           ?  ?  ?  ?  ?  ?  ?  ?  ?  ?  ? ?Social Determinants of Health (SDOH) Interventions ?  ? ? ?Readmission Risk Interventions ?No flowsheet data found. ? ? ? ? ?

## 2021-10-11 NOTE — Telephone Encounter (Signed)
Sean Horn pt son called asking for pt muscle relaxer that was not sent in. They were wondering if you can send it to the [facility]  in [place]. Would like a call when sent.  ? ?CB [phone]  ?

## 2021-10-11 NOTE — Telephone Encounter (Signed)
Patient aware pharmacy has it ready  ?

## 2021-10-11 NOTE — Discharge Summary (Addendum)
Patient ID: Sean Horn MRN: [id number] DOB/AGE: 1961/05/27 61 y.o.  Admit date: 10/10/2021 Discharge date: 10/11/2021  Admission Diagnoses:  Principal Problem:   Unilateral primary osteoarthritis, right hip Active Problems:   Status post total replacement of right hip   Discharge Diagnoses:  Same  Past Medical History:  Diagnosis Date   Anxiety    CAD in native artery    a. cath 06/26/2019 - 80% apical LAD >> medical management    Chronic headaches    Chronic neck pain    Coronary artery disease involving native coronary artery of native heart without angina pectoris 11/06/2019   GERD (gastroesophageal reflux disease)    occasional - OTC as needed   History of chicken pox    History of kidney stones    History of shingles    Hypertension    under control with med., had been on med. since age 52   Insomnia 09/13/2016   Insulin dependent diabetes mellitus    Follows with endocrinology, Dr Chestine Spore reports his last hgba1c was 7.3    Non-insulin dependent type 2 diabetes mellitus (HCC)    Non-ST elevation (NSTEMI) myocardial infarction (HCC) 06/26/2019   Obesity 01/30/2017   Occipital neuralgia    Osteoarthritis    right knee   PONV (postoperative nausea and vomiting)    PONV (postoperative nausea and vomiting)    Preventative health care 08/14/2013   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   And left now    SVT (supraventricular tachycardia) (HCC) 06/26/2019   Tachycardia    TIA (transient ischemic attack) 11/03/2018   Type 2 diabetes mellitus with complication, with long-term current use of insulin (HCC)    Follows with endocrinology, Dr Chestine Spore reports his last hgba1c was 7.3    Urinary frequency 12/11/2016    Surgeries: Procedure(s): Right TOTAL HIP ARTHROPLASTY ANTERIOR APPROACH on 10/10/2021   Consultants:   Discharged Condition: Improved  Hospital Course: Sean Horn is an 61 y.o. male who was admitted 10/10/2021 for operative treatment ofUnilateral primary osteoarthritis, right  hip. Patient has severe unremitting pain that affects sleep, daily activities, and work/hobbies. After pre-op clearance the patient was taken to the operating room on 10/10/2021 and underwent  Procedure(s): Right TOTAL HIP ARTHROPLASTY ANTERIOR APPROACH.    Patient was given perioperative antibiotics:  Anti-infectives (From admission, onward)    Start     Dose/Rate Route Frequency Ordered Stop   10/10/21 1400  ceFAZolin (ANCEF) IVPB 1 g/50 mL premix        1 g 100 mL/hr over 30 Minutes Intravenous Every 6 hours 10/10/21 0858 10/10/21 2037   10/10/21 0630  ceFAZolin (ANCEF) IVPB 2g/100 mL premix        2 g 200 mL/hr over 30 Minutes Intravenous To [facility] 10/10/21 0550 10/10/21 0741   10/10/21 0559  ceFAZolin (ANCEF) 2-4 GM/100ML-% IVPB       Note to Pharmacy: Kandice Hams D: cabinet override      10/10/21 0559 10/10/21 0742        Patient was given sequential compression devices, early ambulation, and chemoprophylaxis to prevent DVT.  Patient benefited maximally from hospital stay and there were no complications.    Recent vital signs: Patient Vitals for the past 24 hrs:  BP Temp Temp src Pulse Resp SpO2  10/11/21 0737 124/76 98.5 F (36.9 C) Oral (!) 110 18 93 %  10/11/21 0358 124/86 97.9 F (36.6 C) -- 99 20 93 %  10/11/21 0357 (!) 136/92 97.6 F (36.4 C)  Oral (!) 111 20 93 %  10/10/21 2248 129/76 99.5 F (37.5 C) -- (!) 110 20 96 %  10/10/21 1941 128/79 98.8 F (37.1 C) Oral (!) 110 20 96 %  10/10/21 1159 108/73 97.8 F (36.6 C) -- 86 20 99 %  10/10/21 1103 107/76 -- -- 81 14 100 %  10/10/21 1048 107/67 -- -- 75 11 100 %  10/10/21 1033 101/70 -- -- 75 13 96 %  10/10/21 1018 103/74 -- -- 81 11 96 %  10/10/21 1003 106/70 -- -- 80 13 95 %  10/10/21 0948 95/68 -- -- 81 11 94 %  10/10/21 0933 (!) 88/65 -- -- 90 12 93 %  10/10/21 0918 (!) 89/62 -- -- 85 13 95 %  10/10/21 0910 99/72 -- -- -- 12 --  10/10/21 0905 (!) 73/57 97.8 F (36.6 C) -- 97 18 94 %      Recent laboratory studies:  Recent Labs    10/11/21 0651  WBC 15.1*  HGB 12.8*  HCT 35.5*  PLT 256  NA 134*  K 4.8  CL 97*  CO2 30  BUN 11  CREATININE 1.14  GLUCOSE 183*  CALCIUM 8.9     Discharge Medications:   Allergies as of 10/11/2021       Reactions   Rosuvastatin Other (See Comments)   Joint pain   Toradol [ketorolac Tromethamine] Itching        Medication List     STOP taking these medications    acetaminophen-codeine 300-30 MG tablet Commonly known as: TYLENOL #3       TAKE these medications    acetaminophen 500 MG tablet Commonly known as: TYLENOL Take 1,000-1,500 mg by mouth at bedtime as needed for moderate pain.   aspirin 81 MG EC tablet Take 1 tablet (81 mg total) by mouth in the morning and at bedtime. What changed: when to take this   atorvastatin 40 MG tablet Commonly known as: LIPITOR Take 1 tablet (40 mg total) by mouth daily.   clopidogrel 75 MG tablet Commonly known as: PLAVIX TAKE 1 TABLET BY MOUTH ONCE DAILY WITH BREAKFAST.   escitalopram 20 MG tablet Commonly known as: LEXAPRO Take 1 tablet by mouth once daily   gabapentin 300 MG capsule Commonly known as: NEURONTIN TAKE 1 CAPSULE BY MOUTH THREE TIMES DAILY   insulin detemir 100 UNIT/ML injection Commonly known as: Levemir INJECT 90 UNITS SUBCUTANEOUSLY AT BEDTIME   isosorbide mononitrate 30 MG 24 hr tablet Commonly known as: IMDUR Take 1 tablet (30 mg total) by mouth daily. Needs appointment for further refills   levothyroxine 25 MCG tablet Commonly known as: SYNTHROID TAKE 1 TABLET BY MOUTH DAILY BEFORE BREAKFAST   lisinopril-hydrochlorothiazide 10-12.5 MG tablet Commonly known as: ZESTORETIC Take 1 tablet by mouth daily.   metFORMIN 500 MG 24 hr tablet Commonly known as: GLUCOPHAGE-XR TAKE 2 TABLETS BY MOUTH TWICE DAILY WITH MEALS   metoprolol succinate 50 MG 24 hr tablet Commonly known as: TOPROL-XL TAKE 1 TABLET BY MOUTH ONCE DAILY. TAKE WITH OR  IMMEDIATELY FOLLOWING A MEAL.   nitroGLYCERIN 0.4 MG SL tablet Commonly known as: Nitrostat Place 1 tablet (0.4 mg total) under the tongue every 5 (five) minutes as needed.   NovoLOG FlexPen 100 UNIT/ML FlexPen Generic drug: insulin aspart Inject 25-35 units 3 times daily with meals What changed:  how much to take how to take this when to take this additional instructions   omeprazole 20 MG capsule Commonly known as: PRILOSEC Take  20 mg by mouth daily.   oxyCODONE 5 MG immediate release tablet Commonly known as: Oxy IR/ROXICODONE Take 1-2 tablets (5-10 mg total) by mouth every 6 (six) hours as needed for moderate pain (pain score 4-6).   rOPINIRole 0.5 MG tablet Commonly known as: REQUIP TAKE 2 TABLETS BY MOUTH AT BEDTIME   Trulicity 4.5 0000000 Sopn Generic drug: Dulaglutide INJECT 4.5 MG SUBCUTANEOUSLY ONCE A WEEK               Durable Medical Equipment  (From admission, onward)           Start     Ordered   10/10/21 1204  DME 3 n 1  Once        10/10/21 1203   10/10/21 1204  DME Walker rolling  Once       Question Answer Comment  Walker: With 5 Inch Wheels   Patient needs a walker to treat with the following condition Status post total replacement of right hip      10/10/21 1203            Diagnostic Studies: DG Pelvis Portable  Result Date: 10/10/2021 CLINICAL DATA:  Postop right hip EXAM: PORTABLE PELVIS 1-2 VIEWS COMPARISON:  MRI hip 06/07/2021 FINDINGS: Postsurgical changes of right total hip arthroplasty. Normal alignment without evidence of loosening or fracture. Expected soft tissue changes. IMPRESSION: Right total hip arthroplasty without evidence of immediate hardware complication. Electronically Signed   By: Maurine Simmering M.D.   On: 10/10/2021 09:21   DG C-Arm 1-60 Min-No Report  Result Date: 10/10/2021 Fluoroscopy was utilized by the requesting physician.  No radiographic interpretation.   DG HIP UNILAT WITH PELVIS 1V RIGHT  Result  Date: 10/10/2021 CLINICAL DATA:  Right hip replacement EXAM: DG HIP (WITH OR WITHOUT PELVIS) 1V RIGHT COMPARISON:  None. FINDINGS: Multiple intraoperative fluoroscopic spot images are provided. Interval right total hip arthroplasty. FLUOROSCOPY TIME:  Fluoroscopy Time:  24 seconds Radiation Exposure Index (if provided by the fluoroscopic device): 2.8 mGy Number of Acquired Spot Images: 0 IMPRESSION: 1. Interval right total hip arthroplasty. Electronically Signed   By: Kathreen Devoid M.D.   On: 10/10/2021 09:04    Disposition: Discharge disposition: 06-Home-Health Care Svc          Follow-up Information     [facility] Follow up.   Specialty: [facility] Contact information: [place] [address] [phone]         Mcarthur Rossetti, MD Follow up in 2 week(s).   Specialty: Orthopedic Surgery Contact information: [address] [place] [address] [phone]                  Signed: Erskine Emery 10/11/2021, 9:00 AM

## 2021-10-11 NOTE — Progress Notes (Signed)
Physical Therapy Treatment ?Patient Details ?Name: Sean Horn ?MRN: [id number] ?DOB: [birth year] ?Today's Date: 10/11/2021 ? ? ?History of Present Illness Pt is a 61 y/o male s/p R THA. PMH includes CAD, DM, HTN, and R TKA. ? ?  ?PT Comments  ? ? Patient progressing towards physical therapy goals. Patient ambulated 200' with RW and supervision with good fluid step through gait pattern. Able to negotiate 4 stairs sideways with R handrail and supervision. Instructed patient on exercises to perform at home, see below. D/c plan remains appropriate.  ?   ?Recommendations for follow up therapy are one component of a multi-disciplinary discharge planning process, led by the attending physician.  Recommendations may be updated based on patient status, additional functional criteria and insurance authorization. ? ?Follow Up Recommendations ? Follow physician's recommendations for discharge plan and follow up therapies ?  ?  ?Assistance Recommended at Discharge Intermittent Supervision/Assistance  ?Patient can return home with the following Help with stairs or ramp for entrance;Assist for transportation;Assistance with cooking/housework ?  ?Equipment Recommendations ? Rolling Modell Fendrick (2 wheels);BSC/3in1  ?  ?Recommendations for Other Services   ? ? ?  ?Precautions / Restrictions Precautions ?Precautions: Fall ?Restrictions ?Weight Bearing Restrictions: Yes ?RLE Weight Bearing: Weight bearing as tolerated  ?  ? ?Mobility ? Bed Mobility ?  ?  ?  ?  ?  ?  ?  ?General bed mobility comments: sitting EOB on arrival ?  ? ?Transfers ?Overall transfer level: Needs assistance ?Equipment used: Rolling Denim Start (2 wheels) ?Transfers: Sit to/from Stand ?Sit to Stand: Min guard ?  ?  ?  ?  ?  ?General transfer comment: min guard for safety. Increased time to complete ?  ? ?Ambulation/Gait ?Ambulation/Gait assistance: Supervision ?Gait Distance (Feet): 200 Feet ?Assistive device: Rolling Nimrit Kehres (2 wheels) ?Gait Pattern/deviations:  Step-through pattern, Decreased stride length, Decreased stance time - right, Antalgic ?Gait velocity: decreased ?  ?  ?General Gait Details: supervision for safety. cues for heel strike and smooth step through gait pattern ? ? ?Stairs ?Stairs: Yes ?Stairs assistance: Supervision ?Stair Management: One rail Right, Step to pattern, Sideways ?Number of Stairs: 4 ?General stair comments: supervision for safety. ? ? ?Wheelchair Mobility ?  ? ?Modified Rankin (Stroke Patients Only) ?  ? ? ?  ?Balance Overall balance assessment: Needs assistance ?Sitting-balance support: No upper extremity supported, Feet supported ?Sitting balance-Leahy Scale: Good ?  ?  ?Standing balance support: Bilateral upper extremity supported ?Standing balance-Leahy Scale: Poor ?Standing balance comment: Reliant on BUE support ?  ?  ?  ?  ?  ?  ?  ?  ?  ?  ?  ?  ? ?  ?Cognition Arousal/Alertness: Awake/alert ?Behavior During Therapy: [facility] for tasks assessed/performed ?Overall Cognitive Status: Within Functional Limits for tasks assessed ?  ?  ?  ?  ?  ?  ?  ?  ?  ?  ?  ?  ?  ?  ?  ?  ?  ?  ?  ? ?  ?Exercises Other Exercises ?Other Exercises: instructed on ankle pumps, LAQ, seated marching, SLR, hip abduction/addution ? ?  ?General Comments   ?  ?  ? ?Pertinent Vitals/Pain Pain Assessment ?Pain Assessment: Faces ?Faces Pain Scale: Hurts even more ?Pain Location: R hip ?Pain Descriptors / Indicators: Grimacing, Guarding ?Pain Intervention(s): Monitored during session  ? ? ?Home Living   ?  ?  ?  ?  ?  ?  ?  ?  ?  ?   ?  ?  Prior Function    ?  ?  ?   ? ?PT Goals (current goals can now be found in the care plan section) Acute Rehab PT Goals ?Patient Stated Goal: to go home ?PT Goal Formulation: With patient/family ?Time For Goal Achievement: 10/24/21 ?Potential to Achieve Goals: Good ?Progress towards PT goals: Progressing toward goals ? ?  ?Frequency ? ? ? 7X/week ? ? ? ?  ?PT Plan Current plan remains appropriate  ? ? ?Co-evaluation   ?  ?  ?  ?  ? ?   ?AM-PAC PT "6 Clicks" Mobility   ?Outcome Measure ? Help needed turning from your back to your side while in a flat bed without using bedrails?: None ?Help needed moving from lying on your back to sitting on the side of a flat bed without using bedrails?: A Little ?Help needed moving to and from a bed to a chair (including a wheelchair)?: A Little ?Help needed standing up from a chair using your arms (e.g., wheelchair or bedside chair)?: A Little ?Help needed to walk in hospital room?: A Little ?Help needed climbing 3-5 steps with a railing? : A Little ?6 Click Score: 19 ? ?  ?End of Session Equipment Utilized During Treatment: Gait belt ?Activity Tolerance: Patient tolerated treatment well ?Patient left: in bed;with call bell/phone within reach ?Nurse Communication: Mobility status ?PT Visit Diagnosis: Other abnormalities of gait and mobility (R26.89);Pain ?Pain - Right/Left: Right ?Pain - part of body: Hip ?  ? ? ?Time: 1950-9326 ?PT Time Calculation (min) (ACUTE ONLY): 14 min ? ?Charges:  $Gait Training: 8-22 mins          ?          ? ?Rhanda Lemire A. Dan Humphreys, PT, DPT ?Acute Rehabilitation Services ?Pager [phone] ?Office [phone] ? ? ? ?Janiyha Montufar A Thomes Burak ?10/11/2021, 10:43 AM ? ?

## 2021-10-11 NOTE — Plan of Care (Signed)
Pt and son given D/C instructions with verbal understanding. Rx's were sent to the pharmacy by MD. Pt's incision is clean and dry with no sign of infection. Pt's IV was removed prior to D/C. Pt received RW and 3-n-1 from [employer] prior to D/C. Pt D/C'd home via wheelchair per MD order. Pt is stable @ D/C and has no other needs at this time. Holli Humbles, RN  ?

## 2021-10-11 NOTE — Progress Notes (Signed)
Subjective: ?1 Day Post-Op Procedure(s) (LRB): ?Right TOTAL HIP ARTHROPLASTY ANTERIOR APPROACH (Right) ?Patient reports pain as moderate.  No complaints otherwise.  ? ?Objective: ?Vital signs in last 24 hours: ?Temp:  [97.6 ?F (36.4 ?C)-99.5 ?F (37.5 ?C)] 98.5 ?F (36.9 ?C) (03/08 2778) ?Pulse Rate:  [75-111] 110 (03/08 0737) ?Resp:  [11-20] 18 (03/08 0737) ?BP: (73-136)/(57-92) 124/76 (03/08 0737) ?SpO2:  [93 %-100 %] 93 % (03/08 0737) ? ?Intake/Output from previous day: ?03/07 0701 - 03/08 0700 ?In: 1330 [P.O.:330; I.V.:900; IV Piggyback:100] ?Out: 2675 [Urine:2375; Blood:300] ?Intake/Output this shift: ?No intake/output data recorded. ? ?Recent Labs  ?  10/11/21 ?0651  ?HGB 12.8*  ? ?Recent Labs  ?  10/11/21 ?0651  ?WBC 15.1*  ?RBC 4.00*  ?HCT 35.5*  ?PLT 256  ? ?Recent Labs  ?  10/11/21 ?0651  ?NA 134*  ?K 4.8  ?CL 97*  ?CO2 30  ?BUN 11  ?CREATININE 1.14  ?GLUCOSE 183*  ?CALCIUM 8.9  ? ?No results for input(s): LABPT, INR in the last 72 hours. ? ?Sensation intact distally ?Intact pulses distally ?Dorsiflexion/Plantar flexion intact ?Incision: dressing C/D/I ?Compartment soft ? ? ?Assessment/Plan: ?1 Day Post-Op Procedure(s) (LRB): ?Right TOTAL HIP ARTHROPLASTY ANTERIOR APPROACH (Right) ?Up with therapy ?Discharge home with home health ? ? ? ? ? ?Linda Grimmer ?10/11/2021, 8:48 AM ? ?

## 2021-10-14 DIAGNOSIS — I10 Essential (primary) hypertension: Secondary | ICD-10-CM | POA: Diagnosis not present

## 2021-10-14 DIAGNOSIS — E119 Type 2 diabetes mellitus without complications: Secondary | ICD-10-CM | POA: Diagnosis not present

## 2021-10-14 DIAGNOSIS — M542 Cervicalgia: Secondary | ICD-10-CM | POA: Diagnosis not present

## 2021-10-14 DIAGNOSIS — M4802 Spinal stenosis, cervical region: Secondary | ICD-10-CM | POA: Diagnosis not present

## 2021-10-14 DIAGNOSIS — E669 Obesity, unspecified: Secondary | ICD-10-CM | POA: Diagnosis not present

## 2021-10-14 DIAGNOSIS — G8929 Other chronic pain: Secondary | ICD-10-CM | POA: Diagnosis not present

## 2021-10-14 DIAGNOSIS — Z471 Aftercare following joint replacement surgery: Secondary | ICD-10-CM | POA: Diagnosis not present

## 2021-10-14 DIAGNOSIS — M5481 Occipital neuralgia: Secondary | ICD-10-CM | POA: Diagnosis not present

## 2021-10-14 DIAGNOSIS — G4700 Insomnia, unspecified: Secondary | ICD-10-CM | POA: Diagnosis not present

## 2021-10-23 ENCOUNTER — Telehealth: Payer: Self-pay | Admitting: Family Medicine

## 2021-10-23 ENCOUNTER — Encounter: Payer: Medicare HMO | Admitting: Orthopaedic Surgery

## 2021-10-23 NOTE — Telephone Encounter (Signed)
LVM for pt to schedule CPE, pt is due.  ?

## 2021-10-24 DIAGNOSIS — E119 Type 2 diabetes mellitus without complications: Secondary | ICD-10-CM | POA: Diagnosis not present

## 2021-10-25 ENCOUNTER — Other Ambulatory Visit: Payer: Self-pay

## 2021-10-25 ENCOUNTER — Encounter: Payer: Self-pay | Admitting: Physician Assistant

## 2021-10-25 ENCOUNTER — Ambulatory Visit (INDEPENDENT_AMBULATORY_CARE_PROVIDER_SITE_OTHER): Payer: Medicare HMO | Admitting: Physician Assistant

## 2021-10-25 DIAGNOSIS — Z96641 Presence of right artificial hip joint: Secondary | ICD-10-CM

## 2021-10-25 NOTE — Progress Notes (Signed)
HPI: [name] returns today status post right total hip arthroplasty 10/10/2021.  He is overall doing well.  He is on long-term Plavix and aspirin.  He had no fevers chills shortness of breath chest pain.  He states he is having no significant pain.  He is only taking the muscle relaxant at night.  He does have some stiffness whenever he first gets up to ambulate.  He is using a cane to ambulate. ? ?Physical exam: Right hip surgical incisions well approximated with staples no signs of infection.  No wound dehiscence.  Right calf supple nontender dorsiflexion plantarflexion right ankle intact.  Fluid motion right hip. ? ?Impression: Status post right total hip arthroplasty 10/10/2021 ? ?Plan: ?Staples removed Steri-Strips applied.*Tissue mobilization encouraged.  Follow-up with [place] in 4 weeks sooner if there is any questions concerns.  Questions were encouraged and answered ?

## 2021-10-26 ENCOUNTER — Telehealth: Payer: Self-pay | Admitting: Family Medicine

## 2021-10-26 NOTE — Telephone Encounter (Signed)
Left message for patient to call back and schedule Medicare Annual Wellness Visit (AWV) in office.  ° °If not able to come in office, please offer to do virtually or by telephone.  Left office number and my jabber #[phone]. ° °Due for AWVI ° °Please schedule at anytime with Nurse Health Advisor. °  °

## 2021-10-28 ENCOUNTER — Other Ambulatory Visit: Payer: Self-pay | Admitting: Orthopedic Surgery

## 2021-10-28 ENCOUNTER — Other Ambulatory Visit: Payer: Self-pay | Admitting: Family Medicine

## 2021-10-28 DIAGNOSIS — G2581 Restless legs syndrome: Secondary | ICD-10-CM

## 2021-10-28 DIAGNOSIS — M5410 Radiculopathy, site unspecified: Secondary | ICD-10-CM

## 2021-10-31 ENCOUNTER — Other Ambulatory Visit: Payer: Self-pay | Admitting: Family Medicine

## 2021-10-31 DIAGNOSIS — F419 Anxiety disorder, unspecified: Secondary | ICD-10-CM

## 2021-11-03 ENCOUNTER — Other Ambulatory Visit (HOSPITAL_COMMUNITY): Payer: Self-pay

## 2021-11-06 ENCOUNTER — Other Ambulatory Visit: Payer: Self-pay | Admitting: Family Medicine

## 2021-11-06 DIAGNOSIS — F419 Anxiety disorder, unspecified: Secondary | ICD-10-CM

## 2021-11-08 ENCOUNTER — Telehealth: Payer: Self-pay | Admitting: Physician Assistant

## 2021-11-08 NOTE — Telephone Encounter (Signed)
Charlyne Quale called with [facility]. Pt refused PT.  ? ?CB [phone]  ?

## 2021-11-09 ENCOUNTER — Ambulatory Visit (INDEPENDENT_AMBULATORY_CARE_PROVIDER_SITE_OTHER): Payer: Medicare HMO | Admitting: Family Medicine

## 2021-11-09 ENCOUNTER — Encounter: Payer: Self-pay | Admitting: Family Medicine

## 2021-11-09 ENCOUNTER — Ambulatory Visit (HOSPITAL_BASED_OUTPATIENT_CLINIC_OR_DEPARTMENT_OTHER)
Admission: RE | Admit: 2021-11-09 | Discharge: 2021-11-09 | Disposition: A | Discharge status: Home / Self Care | Payer: Medicare HMO | Source: Ambulatory Visit | Attending: Family Medicine | Admitting: Family Medicine

## 2021-11-09 VITALS — BP 122/80 | HR 96 | Temp 97.6°F | Resp 18 | Ht 72.0 in | Wt 209.0 lb

## 2021-11-09 DIAGNOSIS — R0781 Pleurodynia: Secondary | ICD-10-CM | POA: Insufficient documentation

## 2021-11-09 DIAGNOSIS — Z794 Long term (current) use of insulin: Secondary | ICD-10-CM

## 2021-11-09 DIAGNOSIS — E118 Type 2 diabetes mellitus with unspecified complications: Secondary | ICD-10-CM | POA: Diagnosis not present

## 2021-11-09 DIAGNOSIS — R4020 Unspecified coma: Secondary | ICD-10-CM

## 2021-11-09 DIAGNOSIS — I10 Essential (primary) hypertension: Secondary | ICD-10-CM

## 2021-11-09 LAB — COMPREHENSIVE METABOLIC PANEL
ALT: 13 U/L (ref 0–53)
AST: 16 U/L (ref 0–37)
Albumin: 4.5 g/dL (ref 3.5–5.2)
Alkaline Phosphatase: 88 U/L (ref 39–117)
BUN: 9 mg/dL (ref 6–23)
CO2: 27 mEq/L (ref 19–32)
Calcium: 9.4 mg/dL (ref 8.4–10.5)
Chloride: 98 mEq/L (ref 96–112)
Creatinine, Ser: 0.94 mg/dL (ref 0.40–1.50)
GFR: 88.22 mL/min (ref >60.00)
Glucose, Bld: 168 mg/dL — ABNORMAL HIGH (ref 70–99)
Potassium: 4.2 mEq/L (ref 3.5–5.1)
Sodium: 137 mEq/L (ref 135–145)
Total Bilirubin: 1.6 mg/dL — ABNORMAL HIGH (ref 0.2–1.2)
Total Protein: 6.9 g/dL (ref 6.0–8.3)

## 2021-11-09 LAB — CBC
HCT: 39.4 % (ref 39.0–52.0)
Hemoglobin: 13.4 g/dL (ref 13.0–17.0)
MCHC: 34.2 g/dL (ref 30.0–36.0)
MCV: 92.1 fl (ref 78.0–100.0)
Platelets: 290 10*3/uL (ref 150.0–400.0)
RBC: 4.27 Mil/uL (ref 4.22–5.81)
RDW: 13.9 % (ref 11.5–15.5)
WBC: 6.5 10*3/uL (ref 4.0–10.5)

## 2021-11-09 LAB — HEMOGLOBIN A1C: Hgb A1c MFr Bld: 6.9 % — ABNORMAL HIGH (ref 4.6–6.5)

## 2021-11-09 NOTE — Patient Instructions (Signed)
Good to see you today!  Please let me know if any other unusual episodes or concerns ?We will get labs and rib x-rays today ?We will set you up for a heart monitor to wear at home for a few days ? ? ?

## 2021-11-09 NOTE — Progress Notes (Signed)
[facility] ?[address], [address] ?[place], [place] [address] ?[phone] ?Fax [phone] ? ?Date:  11/09/2021  ? ?Name:  Sean KneeCharles W Kinchen   DOB:  10/05/1960   MRN:  [id number] ? ?PCP:  Manila Rommel, Gwenlyn FoundJessica C, MD  ? ? ?Chief Complaint: Pain Management (Concerns/ questions: pt had an episode of LOC 2 days ago after taking Tizanidine. He thinks he has bruised his back/ ribs. Pt did hit his head.No headaches or any other sxs. ) ? ? ?History of Present Illness: ? ?Sean KneeCharles W Morea is a 61 y.o. very pleasant male patient who presents with the following: ? ?Patient seen today for pain follow-up ?Most recent visit with myself in October ?History of hypertension, NSTEMI/CAD, SVT status post catheter ablation in December 2020.  Poorly controlled diabetes, hypothyroidism ? ?He underwent a right total hip replacement about 1 month ago per Dr. Magnus IvanBlackman ?He is very happy with his results and has finished PT ?His pain from the hip is minimal  ?He was taking a muscle relaxer- tizanidine ?2 days ago he had LOC- he was laying in bed, asleep- got up to get a drink.  He took his muscle relaxer but no pain meds prior to bed. He walked to the kitchen- he felt dizzy and then woke up on the floor  ?He hit his left posterior ribs on a table when he passed out ?He is not sure if he hit his head  ?He thinks he was out for 5- 10 seconds  ?He just went back to bed  ?He had noted he was feeling a bit dizzy when taking tinzantidine -he has since stopped using this medication.  He had not taken any other sedating medication except for his usual gabapentin. ?He did check his blood sugar at this time, approximately 150 ? ?He currently feels well, no CP or SOB  ? ?His head feels fine, not painful and no headache ?He is sore over his left posterior ribs from where he fell.  No blood in urine or abdominal pain  ? ?Lab Results  ?Component Value Date  ? HGBA1C 7.4 (H) 09/04/2021  ? ?Due for foot exam - done today  ?Eye  exam ? ?I have been treating his chronic orthopedic pain with Tylenol 3- he does not request this today as he had his hip replaced  ? ?Patient Active Problem List  ? Diagnosis Date Noted  ? 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  ? Unilateral primary osteoarthritis, right hip 06/26/2021  ? Hyperbilirubinemia 11/15/2020  ? PONV (postoperative nausea and vomiting)   ? Non-insulin dependent type 2 diabetes mellitus (HCC)   ? Chronic neck pain   ? Chronic headaches   ? CAD in native artery   ? Coronary artery disease involving native coronary artery of native heart without angina pectoris 11/06/2019  ? Diastolic dysfunction 11/06/2019  ? SVT (supraventricular tachycardia) (HCC) 06/26/2019  ? Non-ST elevation (NSTEMI) myocardial infarction (HCC) 06/26/2019  ? Tachycardia 06/25/2019  ? Hypertriglyceridemia 06/16/2019  ? Palpitations 06/12/2019  ? Morning headache 12/02/2018  ? Insomnia secondary to chronic pain 12/02/2018  ? Retrognathia 12/02/2018  ? TIA (transient ischemic attack) 11/03/2018  ? Cervical stenosis of spine 01/16/2018  ? Hyperlipidemia 07/19/2017  ? Obesity 01/30/2017  ? Urinary frequency 12/11/2016  ? Insomnia 09/13/2016  ? History of chicken pox   ? History of shingles   ? Preventative health care 08/14/2013  ? Anxiety   ? History of  kidney stones   ? GERD (gastroesophageal reflux disease)   ? Type 2 diabetes mellitus with complication, with long-term current use of insulin (HCC)   ? Hypertension   ? Osteoarthritis   ? Right Horn DJD 12/23/2012  ?  Class: Chronic  ? ? ?Past Medical History:  ?Diagnosis Date  ? Anxiety   ? CAD in native artery   ? a. cath 06/26/2019 - 80% apical LAD >> medical management   ? Chronic headaches   ? Chronic neck pain   ? Coronary artery disease involving native coronary artery of native heart without angina pectoris 11/06/2019  ? GERD (gastroesophageal reflux disease)   ? occasional - OTC as needed  ? History of chicken pox   ? History of kidney stones   ?  History of shingles   ? Hypertension   ? under control with med., had been on med. since age 47  ? Insomnia 09/13/2016  ? Insulin dependent diabetes mellitus   ? Follows with endocrinology, Dr Chestine Spore reports his last hgba1c was 7.3   ? Non-insulin dependent type 2 diabetes mellitus (HCC)   ? Non-ST elevation (NSTEMI) myocardial infarction (HCC) 06/26/2019  ? Obesity 01/30/2017  ? Occipital neuralgia   ? Osteoarthritis   ? right Horn  ? PONV (postoperative nausea and vomiting)   ? PONV (postoperative nausea and vomiting)   ? Preventative health care 08/14/2013  ? Right Horn DJD 12/23/2012  ? And left now   ? SVT (supraventricular tachycardia) (HCC) 06/26/2019  ? Tachycardia   ? TIA (transient ischemic attack) 11/03/2018  ? Type 2 diabetes mellitus with complication, with long-term current use of insulin (HCC)   ? Follows with endocrinology, Dr Chestine Spore reports his last hgba1c was 7.3   ? Urinary frequency 12/11/2016  ? ? ?Past Surgical History:  ?Procedure Laterality Date  ? ANTERIOR CERVICAL DECOMP/DISCECTOMY FUSION N/A 01/16/2018  ? Procedure: ANTERIOR CERVICAL DECOMPRESSION/DISCECTOMY FUSION CERVICAL FOUR-FIVE, CERVICAL FIVE-SIX;  Surgeon: Lisbeth Renshaw, MD;  Location: [facility] OR;  Service: Neurosurgery;  Laterality: N/A;  ANTERIOR CERVICAL DECOMPRESSION/DISCECTOMY FUSION CERVICAL FOUR-FIVE, CERVICAL FIVE-SIX  ? CHOLECYSTECTOMY  1980's  ? CYSTOSCOPY/RETROGRADE/URETEROSCOPY/STONE EXTRACTION WITH BASKET Right 08/27/2003  ? and double J stent placement  ? ELECTROPHYSIOLOGY STUDY N/A 07/23/2019  ? Procedure: ELECTROPHYSIOLOGY STUDY;  Surgeon: Marinus Maw, MD;  Location: [facility] INVASIVE CV LAB;  Service: Cardiovascular;  Laterality: N/A;  ? INGUINAL HERNIA REPAIR Left 1990's  ? Horn ARTHROSCOPY Left 1980's  ? Horn ARTHROSCOPY Right 2013  ? Horn ARTHROSCOPY Right 06/30/2013  ? Procedure: RIGHT Horn ARTHROSCOPY AND SYNOVECTOMY;  Surgeon: Velna Ochs, MD;  Location: [facility];  Service: Orthopedics;  Laterality:  Right;  ? LEFT HEART CATH AND CORONARY ANGIOGRAPHY N/A 06/26/2019  ? Procedure: LEFT HEART CATH AND CORONARY ANGIOGRAPHY;  Surgeon: Marykay Lex, MD;  Location: [facility] INVASIVE CV LAB;  Service: Cardiovascular;  Laterality: N/A;  ? LITHOTRIPSY    ? multiple, b/l  ? SHOULDER ADHESION RELEASE Left ` 1998  ? SHOULDER ARTHROSCOPY W/ ROTATOR CUFF REPAIR Left 10/20/2004  ? x 4, had to have part of clavile removed. torn rotator cuff multiple times, had to place screws   ? SHOULDER ARTHROSCOPY W/ SUPERIOR LABRAL ANTERIOR POSTERIOR LESION REPAIR Left 12/22/2004  ? SVT ABLATION N/A 07/23/2019  ? Procedure: SVT ABLATION;  Surgeon: Marinus Maw, MD;  Location: [facility] INVASIVE CV LAB;  Service: Cardiovascular;  Laterality: N/A;  ? TOTAL HIP ARTHROPLASTY Right 10/10/2021  ? Procedure: Right TOTAL HIP ARTHROPLASTY  ANTERIOR APPROACH;  Surgeon: Kathryne Hitch, MD;  Location: [facility] OR;  Service: Orthopedics;  Laterality: Right;  ? TOTAL Horn ARTHROPLASTY Right 12/23/2012  ? x3, 2 arthroscopies and a TKR,  TOTAL Horn ARTHROPLASTY;  Surgeon: Velna Ochs, MD;  Location: [facility] OR;  Service: Orthopedics;  Laterality: Right;  DEPUY, RNFA  ? WRIST SURGERY Right   ? x 3, torn ligaments, pins placed then infected, then fused with plate  ? ? ?Social History  ? ?Tobacco Use  ? Smoking status: Never  ? Smokeless tobacco: Current  ?  Types: Snuff  ?Vaping Use  ? Vaping Use: Never used  ?Substance Use Topics  ? Alcohol use: No  ? Drug use: No  ? ? ?Family History  ?Problem Relation Age of Onset  ? COPD Mother   ?     smoker  ? Hypertension Mother   ? Heart disease Mother   ?     CHF  ? Diabetes Father   ? Heart disease Father   ?     CABG in 50's  ? Stroke Father   ? Hypertension Father   ? Peripheral vascular disease Father   ? Hypertension Son   ?     tachycardia  ? Heart disease Maternal Grandfather   ? Arthritis Paternal Grandmother   ? Colon cancer Neg Hx   ? Rectal cancer Neg Hx   ? Stomach cancer Neg Hx   ? ? ?Allergies  ?Allergen  Reactions  ? Rosuvastatin Other (See Comments)  ?  Joint pain  ? Toradol [Ketorolac Tromethamine] Itching  ? ? ?Medication list has been reviewed and updated. ? ?Current Outpatient Medications on File Prior to Visit

## 2021-11-10 ENCOUNTER — Telehealth: Payer: Self-pay

## 2021-11-10 ENCOUNTER — Other Ambulatory Visit: Payer: Self-pay | Admitting: Family Medicine

## 2021-11-10 ENCOUNTER — Ambulatory Visit (INDEPENDENT_AMBULATORY_CARE_PROVIDER_SITE_OTHER): Payer: Medicare HMO

## 2021-11-10 DIAGNOSIS — R42 Dizziness and giddiness: Secondary | ICD-10-CM

## 2021-11-10 DIAGNOSIS — R4020 Unspecified coma: Secondary | ICD-10-CM

## 2021-11-10 NOTE — Telephone Encounter (Addendum)
Called pt to set him up with an appt with Dr. Servando Salina. Appointment should be scheduled for the second week of May since he had a monitor placed today and results should be back at the time. No answer at this time, left message for him to return the call.   ?

## 2021-11-10 NOTE — Progress Notes (Unsigned)
Enrolled patient for a 7 day Zio XT monitor to be mailed to patients home.  

## 2021-11-13 DIAGNOSIS — R42 Dizziness and giddiness: Secondary | ICD-10-CM

## 2021-11-20 ENCOUNTER — Ambulatory Visit: Payer: Medicare HMO | Admitting: Family Medicine

## 2021-11-22 ENCOUNTER — Ambulatory Visit: Payer: Medicare HMO | Admitting: Physician Assistant

## 2021-11-24 DIAGNOSIS — R42 Dizziness and giddiness: Secondary | ICD-10-CM | POA: Diagnosis not present

## 2021-11-24 DIAGNOSIS — R4020 Unspecified coma: Secondary | ICD-10-CM | POA: Diagnosis not present

## 2021-11-26 ENCOUNTER — Encounter: Payer: Self-pay | Admitting: Family Medicine

## 2021-11-27 ENCOUNTER — Ambulatory Visit: Payer: Medicare HMO | Admitting: Physician Assistant

## 2021-11-27 ENCOUNTER — Encounter: Payer: Self-pay | Admitting: Family Medicine

## 2021-12-05 ENCOUNTER — Encounter: Payer: Self-pay | Admitting: Family Medicine

## 2021-12-05 DIAGNOSIS — M5410 Radiculopathy, site unspecified: Secondary | ICD-10-CM

## 2021-12-05 MED ORDER — GABAPENTIN 300 MG PO CAPS: 300.0000 mg | ORAL_CAPSULE | Freq: Three times a day (TID) | ORAL | 0 refills | Status: DC

## 2021-12-05 MED ORDER — METFORMIN HCL ER 500 MG PO TB24: ORAL_TABLET | ORAL | 3 refills | Status: DC

## 2021-12-11 ENCOUNTER — Ambulatory Visit: Payer: Medicare HMO | Admitting: Cardiology

## 2021-12-11 ENCOUNTER — Encounter: Payer: Self-pay | Admitting: Cardiology

## 2021-12-11 VITALS — BP 120/80 | HR 95 | Ht 72.0 in | Wt 220.8 lb

## 2021-12-11 DIAGNOSIS — I471 Supraventricular tachycardia: Secondary | ICD-10-CM

## 2021-12-11 DIAGNOSIS — G459 Transient cerebral ischemic attack, unspecified: Secondary | ICD-10-CM

## 2021-12-11 DIAGNOSIS — E669 Obesity, unspecified: Secondary | ICD-10-CM | POA: Diagnosis not present

## 2021-12-11 DIAGNOSIS — I2510 Atherosclerotic heart disease of native coronary artery without angina pectoris: Secondary | ICD-10-CM

## 2021-12-11 DIAGNOSIS — I10 Essential (primary) hypertension: Secondary | ICD-10-CM

## 2021-12-11 DIAGNOSIS — I4729 Other ventricular tachycardia: Secondary | ICD-10-CM

## 2021-12-11 DIAGNOSIS — E119 Type 2 diabetes mellitus without complications: Secondary | ICD-10-CM | POA: Diagnosis not present

## 2021-12-11 MED ORDER — METOPROLOL SUCCINATE ER 50 MG PO TB24: ORAL_TABLET | ORAL | 3 refills | Status: DC

## 2021-12-11 NOTE — Progress Notes (Signed)
?Cardiology Office Note:   ? ?Date:  12/12/2021  ? ?ID:  Sean KneeCharles W Horn, DOB 05/12/1961, MRN [id number] ? ?PCP:  Copland, Gwenlyn FoundJessica C, MD  ?Cardiologist:  Thomasene RippleKardie Murel Wigle, DO  ?Electrophysiologist:  None  ? ?Referring MD: Pearline Cablesopland, Jessica C, MD ? ?" I am ok- had syncope episode in March, to have some palpitations" ? ? ?History of Present Illness:   ? ?Sean Horn is a 61 y.o. male with a hx of hypertension, NSTEMI/coronary artery disease status post left heart cath June 26, 2019 with 80% apical LAD lesions which has been stated for medical management due to this lesion being a unsuitable candidate for PCI, NSVT seen on monitor, SVT/AVNRT status post ablation on July 23, 2019. ?  ?I did see the patient on March 23, 2020 at that time he reported that he had a syncope episode.  Given this we placed a monitor and the patient.  He did wear the monitor the first 1 for 1 day and 3 hours which showed nonsustained ventricular tachycardia, because this fell out we will send the patient another monitor which he wore for longer duration and it was unremarkable. ?  ?I last saw the patient October 2021 at that time he has been shortness of breath I repeated an echocardiogram.  I also increased his beta-blocker given his notable episodes of nonsustained ventricular tachycardia on his ZIO monitor. ? ?I saw the patient on June 19, 2021 at that time he was planning to go nuclear procedure and he was cleared. ? ?He tells me that postsurgery he had a syncope episode.  And has had minimal palpitations.  No chest pain or shortness of breath.  Prior to his visit I discussed with the patient's PCP he did wear a monitor which showed paroxysmal SVT as well as NSVT. ? ? ?Past Medical History:  ?Diagnosis Date  ? Anxiety   ? CAD in native artery   ? a. cath 06/26/2019 - 80% apical LAD >> medical management   ? Chronic headaches   ? Chronic neck pain   ? Coronary artery disease involving native coronary artery of native heart without  angina pectoris 11/06/2019  ? GERD (gastroesophageal reflux disease)   ? occasional - OTC as needed  ? History of chicken pox   ? History of kidney stones   ? History of shingles   ? Hypertension   ? under control with med., had been on med. since age 61  ? Insomnia 09/13/2016  ? Insulin dependent diabetes mellitus   ? Follows with endocrinology, Dr Chestine Sporelark reports his last hgba1c was 7.3   ? Non-insulin dependent type 2 diabetes mellitus (HCC)   ? Non-ST elevation (NSTEMI) myocardial infarction (HCC) 06/26/2019  ? Obesity 01/30/2017  ? Occipital neuralgia   ? Osteoarthritis   ? right knee  ? PONV (postoperative nausea and vomiting)   ? PONV (postoperative nausea and vomiting)   ? Preventative health care 08/14/2013  ? 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 And left now   ? SVT (supraventricular tachycardia) (HCC) 06/26/2019  ? Tachycardia   ? TIA (transient ischemic attack) 11/03/2018  ? Type 2 diabetes mellitus with complication, with long-term current use of insulin (HCC)   ? Follows with endocrinology, Dr Chestine Sporelark reports his last hgba1c was 7.3   ? Urinary frequency 12/11/2016  ? ? ?Past Surgical History:  ?Procedure Laterality Date  ? ANTERIOR CERVICAL DECOMP/DISCECTOMY FUSION N/A 01/16/2018  ? Procedure: ANTERIOR CERVICAL DECOMPRESSION/DISCECTOMY FUSION CERVICAL FOUR-FIVE, CERVICAL FIVE-SIX;  Surgeon: Lisbeth Renshaw, MD;  Location: [facility] OR;  Service: Neurosurgery;  Laterality: N/A;  ANTERIOR CERVICAL DECOMPRESSION/DISCECTOMY FUSION CERVICAL FOUR-FIVE, CERVICAL FIVE-SIX  ? CHOLECYSTECTOMY  1980's  ? CYSTOSCOPY/RETROGRADE/URETEROSCOPY/STONE EXTRACTION WITH BASKET Right 08/27/2003  ? and double J stent placement  ? ELECTROPHYSIOLOGY STUDY N/A 07/23/2019  ? Procedure: ELECTROPHYSIOLOGY STUDY;  Surgeon: Marinus Maw, MD;  Location: [facility] INVASIVE CV LAB;  Service: Cardiovascular;  Laterality: N/A;  ? INGUINAL HERNIA REPAIR Left 1990's  ? KNEE ARTHROSCOPY Left 1980's  ? KNEE ARTHROSCOPY Right 2013  ? KNEE ARTHROSCOPY Right 06/30/2013  ?  Procedure: RIGHT KNEE ARTHROSCOPY AND SYNOVECTOMY;  Surgeon: Velna Ochs, MD;  Location: [facility];  Service: Orthopedics;  Laterality: Right;  ? LEFT HEART CATH AND CORONARY ANGIOGRAPHY N/A 06/26/2019  ? Procedure: LEFT HEART CATH AND CORONARY ANGIOGRAPHY;  Surgeon: Marykay Lex, MD;  Location: [facility] INVASIVE CV LAB;  Service: Cardiovascular;  Laterality: N/A;  ? LITHOTRIPSY    ? multiple, b/l  ? SHOULDER ADHESION RELEASE Left ` 1998  ? SHOULDER ARTHROSCOPY W/ ROTATOR CUFF REPAIR Left 10/20/2004  ? x 4, had to have part of clavile removed. torn rotator cuff multiple times, had to place screws   ? SHOULDER ARTHROSCOPY W/ SUPERIOR LABRAL ANTERIOR POSTERIOR LESION REPAIR Left 12/22/2004  ? SVT ABLATION N/A 07/23/2019  ? Procedure: SVT ABLATION;  Surgeon: Marinus Maw, MD;  Location: [facility] INVASIVE CV LAB;  Service: Cardiovascular;  Laterality: N/A;  ? TOTAL HIP ARTHROPLASTY Right 10/10/2021  ? Procedure: Right TOTAL HIP ARTHROPLASTY ANTERIOR APPROACH;  Surgeon: Kathryne Hitch, MD;  Location: [facility] OR;  Service: Orthopedics;  Laterality: Right;  ? TOTAL KNEE ARTHROPLASTY Right 12/23/2012  ? x3, 2 arthroscopies and a TKR,  TOTAL KNEE ARTHROPLASTY;  Surgeon: Velna Ochs, MD;  Location: [facility] OR;  Service: Orthopedics;  Laterality: Right;  DEPUY, RNFA  ? WRIST SURGERY Right   ? x 3, torn ligaments, pins placed then infected, then fused with plate  ? ? ?Current Medications: ?Current Meds  ?Medication Sig  ? acetaminophen (TYLENOL) 500 MG tablet Take 1,000-1,500 mg by mouth at bedtime as needed for moderate pain.  ? aspirin 81 MG EC tablet Take 1 tablet (81 mg total) by mouth in the morning and at bedtime.  ? atorvastatin (LIPITOR) 40 MG tablet Take 1 tablet (40 mg total) by mouth daily.  ? clopidogrel (PLAVIX) 75 MG tablet TAKE 1 TABLET BY MOUTH ONCE DAILY WITH BREAKFAST.  ? Dulaglutide (TRULICITY) 4.5 MG/0.5ML SOPN INJECT 4.5 MG SUBCUTANEOUSLY ONCE A WEEK  ? escitalopram (LEXAPRO) 20 MG tablet  Take 1 tablet by mouth once daily  ? gabapentin (NEURONTIN) 300 MG capsule Take 1 capsule (300 mg total) by mouth 3 (three) times daily.  ? insulin aspart (NOVOLOG FLEXPEN) 100 UNIT/ML FlexPen Inject 25-35 units 3 times daily with meals (Patient taking differently: Inject 20-25 Units into the skin 3 (three) times daily with meals.)  ? insulin detemir (LEVEMIR) 100 UNIT/ML injection INJECT 90 UNITS SUBCUTANEOUSLY AT BEDTIME  ? isosorbide mononitrate (IMDUR) 30 MG 24 hr tablet Take 1 tablet (30 mg total) by mouth daily. Needs appointment for further refills  ? levothyroxine (SYNTHROID) 25 MCG tablet TAKE 1 TABLET BY MOUTH ONCE DAILY BEFORE BREAKFAST  ? lisinopril-hydrochlorothiazide (ZESTORETIC) 10-12.5 MG tablet Take 1 tablet by mouth daily.  ? metFORMIN (GLUCOPHAGE-XR) 500 MG 24 hr tablet TAKE 2 TABLETS BY MOUTH TWICE DAILY WITH MEALS  ? metoprolol succinate (TOPROL XL) 50 MG 24 hr tablet Take  50 mg in the morning with or immediately following a meal. Take 25 mg in the evening with or immediately following a meal.  ? nitroGLYCERIN (NITROSTAT) 0.4 MG SL tablet Place 1 tablet (0.4 mg total) under the tongue every 5 (five) minutes as needed.  ? omeprazole (PRILOSEC) 20 MG capsule Take 20 mg by mouth daily.  ? rOPINIRole (REQUIP) 0.5 MG tablet TAKE 2 TABLETS BY MOUTH AT BEDTIME  ? [DISCONTINUED] metoprolol succinate (TOPROL-XL) 50 MG 24 hr tablet TAKE 1 TABLET BY MOUTH ONCE DAILY. TAKE WITH OR IMMEDIATELY FOLLOWING A MEAL.  ?  ? ?Allergies:   Rosuvastatin and Toradol [ketorolac tromethamine]  ? ?Social History  ? ?Socioeconomic History  ? Marital status: Single  ?  Spouse name: Not on file  ? Number of children: 2  ? Years of education: Not on file  ? Highest education level: High school graduate  ?Occupational History  ? Occupation: Disabled  ?Tobacco Use  ? Smoking status: Never  ? Smokeless tobacco: Current  ?  Types: Snuff  ?Vaping Use  ? Vaping Use: Never used  ?Substance and Sexual Activity  ? Alcohol use: No  ?  Drug use: No  ? Sexual activity: Yes  ?Other Topics Concern  ? Not on file  ?Social History Narrative  ? Lives at home with his father (he takes care of his father)  ? Right handed  ? Drinks 2 cups of caffe

## 2021-12-11 NOTE — Patient Instructions (Signed)
Medication Instructions:  ?Your physician has recommended you make the following change in your medication:  ?START: Metoprolol 50 mg in the morning, 25 mg in the evening.  ? ?Please follow up with Dr. Ladona Ridgel (EP)  ?*If you need a refill on your cardiac medications before your next appointment, please call your pharmacy* ? ? ?Lab Work: ?None ?If you have labs (blood work) drawn today and your tests are completely normal, you will receive your results only by: ?MyChart Message (if you have MyChart) OR ?A paper copy in the mail ?If you have any lab test that is abnormal or we need to change your treatment, we will call you to review the results. ? ? ?Testing/Procedures: ?None ? ? ?Follow-Up: ?At [facility], you and your health needs are our priority.  As part of our continuing mission to provide you with exceptional heart care, we have created designated Provider Care Teams.  These Care Teams include your primary Cardiologist (physician) and Advanced Practice Providers (APPs -  Physician Assistants and Nurse Practitioners) who all work together to provide you with the care you need, when you need it. ? ?We recommend signing up for the patient portal called "MyChart".  Sign up information is provided on this After Visit Summary.  MyChart is used to connect with patients for Virtual Visits (Telemedicine).  Patients are able to view lab/test results, encounter notes, upcoming appointments, etc.  Non-urgent messages can be sent to your provider as well.   ?To learn more about what you can do with MyChart, go to [email or web address].   ? ?Your next appointment:   ?6 month(s) ? ?The format for your next appointment:   ?In Person ? ?Provider:   ?Thomasene Ripple, DO   ? ? ?Other Instructions ? ? ?Important Information About Sugar ? ? ? ? ?  ?

## 2021-12-12 DIAGNOSIS — I471 Supraventricular tachycardia: Secondary | ICD-10-CM | POA: Insufficient documentation

## 2021-12-12 DIAGNOSIS — I4729 Other ventricular tachycardia: Secondary | ICD-10-CM | POA: Insufficient documentation

## 2021-12-27 ENCOUNTER — Telehealth: Payer: Self-pay | Admitting: Family Medicine

## 2021-12-27 NOTE — Telephone Encounter (Signed)
Left message for patient to call back and schedule Medicare Annual Wellness Visit (AWV) in office.   If not able to come in office, please offer to do virtually or by telephone.  Left office number and my jabber [phone].  AWVI eligible as of 07/06/2017  Please schedule at anytime with Nurse Health Advisor.

## 2022-01-08 ENCOUNTER — Other Ambulatory Visit: Payer: Self-pay | Admitting: Family Medicine

## 2022-01-08 ENCOUNTER — Encounter: Payer: Self-pay | Admitting: Family Medicine

## 2022-01-08 DIAGNOSIS — M5410 Radiculopathy, site unspecified: Secondary | ICD-10-CM

## 2022-01-08 DIAGNOSIS — S73191D Other sprain of right hip, subsequent encounter: Secondary | ICD-10-CM

## 2022-01-08 DIAGNOSIS — M5126 Other intervertebral disc displacement, lumbar region: Secondary | ICD-10-CM

## 2022-01-08 MED ORDER — ACETAMINOPHEN-CODEINE 300-30 MG PO TABS
1.0000 | ORAL_TABLET | Freq: Three times a day (TID) | ORAL | 0 refills | Status: DC | PRN
Start: 2022-01-08 — End: 2022-01-29

## 2022-01-22 ENCOUNTER — Ambulatory Visit: Payer: Medicare HMO | Admitting: Internal Medicine

## 2022-01-22 ENCOUNTER — Encounter: Payer: Self-pay | Admitting: Internal Medicine

## 2022-01-22 VITALS — BP 102/68 | HR 85 | Ht 72.0 in | Wt 219.0 lb

## 2022-01-22 DIAGNOSIS — I471 Supraventricular tachycardia: Secondary | ICD-10-CM | POA: Diagnosis not present

## 2022-01-22 DIAGNOSIS — R55 Syncope and collapse: Secondary | ICD-10-CM

## 2022-01-22 MED ORDER — FLECAINIDE ACETATE 50 MG PO TABS: 50.0000 mg | ORAL_TABLET | Freq: Two times a day (BID) | ORAL | 3 refills | Status: DC

## 2022-01-22 NOTE — Patient Instructions (Addendum)
Medication Instructions:  Your physician has recommended you make the following change in your medication:   Take Flecainide 50 mg-   Take one tablet by mouth two times a day.     Labwork: None ordered.  Testing/Procedures: None ordered.  Follow-Up:  You will be scheduled for an EKG appointment in two weeks.    Your physician wants you to follow-up in: 3 months with Lewayne Bunting, MD.   Any Other Special Instructions Will Be Listed Below (If Applicable).  If you need a refill on your cardiac medications before your next appointment, please call your pharmacy.   Important Information About Sugar       Flecainide Tablets What is this medication? FLECAINIDE (FLEK a nide) prevents and treats a fast or irregular heartbeat (arrhythmia). It is often used to treat a type of arrhythmia known as AFib (atrial fibrillation). It works by slowing down overactive electric signals in the heart, which stabilizes your heart rhythm. It belongs to a group of medications called antiarrhythmics. This medicine may be used for other purposes; ask your health care provider or pharmacist if you have questions. COMMON BRAND NAME(S): Tambocor What should I tell my care team before I take this medication? They need to know if you have any of these conditions: Abnormal levels of potassium in the blood Heart disease including heart rhythm and heart rate problems Kidney or liver disease Recent heart attack An unusual or allergic reaction to flecainide, local anesthetics, other medications, foods, dyes, or preservatives Pregnant or trying to get pregnant Breast-feeding How should I use this medication? Take this medication by mouth with a glass of water. Follow the directions on the prescription label. You can take this medication with or without food. Take your doses at regular intervals. Do not take your medication more often than directed. Do not stop taking this medication suddenly. This may cause  serious, heart-related side effects. If your care team wants you to stop the medication, the dose may be slowly lowered over time to avoid any side effects. Talk to your care team regarding the use of this medication in children. While this medication may be prescribed for children as Bruchy Mikel as 1 year of age for selected conditions, precautions do apply. Overdosage: If you think you have taken too much of this medicine contact a poison control center or emergency room at once. NOTE: This medicine is only for you. Do not share this medicine with others. What if I miss a dose? If you miss a dose, take it as soon as you can. If it is almost time for your next dose, take only that dose. Do not take double or extra doses. What may interact with this medication? Do not take this medication with any of the following: Amoxapine Arsenic trioxide Certain antibiotics like clarithromycin, erythromycin, gatifloxacin, gemifloxacin, levofloxacin, moxifloxacin, sparfloxacin, or troleandomycin Certain antidepressants called tricyclic antidepressants like amitriptyline, imipramine, or nortriptyline Certain medications to control heart rhythm like disopyramide, encainide, moricizine, procainamide, propafenone, and quinidine Cisapride Delavirdine Droperidol Haloperidol [place]orn Imatinib Levomethadyl Maprotiline Medications for malaria like chloroquine and halofantrine Pentamidine Phenothiazines like chlorpromazine, mesoridazine, prochlorperazine, thioridazine Pimozide Quinine Ranolazine Ritonavir Sertindole This medication may also interact with the following: Cimetidine Dofetilide Medications for angina or high blood pressure Medications to control heart rhythm like amiodarone and digoxin Ziprasidone This list may not describe all possible interactions. Give your health care provider a list of all the medicines, herbs, non-prescription drugs, or dietary supplements you use. Also tell them if you  smoke, drink alcohol, or use illegal drugs. Some items may interact with your medicine. What should I watch for while using this medication? Visit your care team for regular checks on your progress. Because your condition and the use of this medication carries some risk, it is a good idea to carry an identification card, necklace or bracelet with details of your condition, medications, and care team. Check your blood pressure and pulse rate regularly. Ask your care team what your blood pressure and pulse rate should be, and when you should contact them. Your care team also may schedule regular blood tests and electrocardiograms to check your progress. You may get drowsy or dizzy. Do not drive, use machinery, or do anything that needs mental alertness until you know how this medication affects you. Do not stand or sit up quickly, especially if you are an older patient. This reduces the risk of dizzy or fainting spells. Alcohol can make you more dizzy, increase flushing and rapid heartbeats. Avoid alcoholic drinks. What side effects may I notice from receiving this medication? Side effects that you should report to your care team as soon as possible: Allergic reactions--skin rash, itching, hives, swelling of the face, lips, tongue, or throat Heart failure--shortness of breath, swelling of the ankles, feet, or hands, sudden weight gain, unusual weakness or fatigue Heart rhythm changes--fast or irregular heartbeat, dizziness, feeling faint or lightheaded, chest pain, trouble breathing Liver injury--right upper belly pain, loss of appetite, nausea, light-colored stool, dark yellow or brown urine, yellowing skin or eyes, unusual weakness or fatigue Side effects that usually do not require medical attention (report to your care team if they continue or are bothersome): Blurry vision Constipation Dizziness Fatigue Headache Nausea Tremors or shaking This list may not describe all possible side effects.  Call your doctor for medical advice about side effects. You may report side effects to FDA at 1-800-FDA-1088. Where should I keep my medication? Keep out of the reach of children and pets. Store at room temperature between 15 and 30 degrees C (59 and 86 degrees F). Protect from light. Keep container tightly closed. Throw away any unused medication after the expiration date. NOTE: This sheet is a summary. It may not cover all possible information. If you have questions about this medicine, talk to your doctor, pharmacist, or health care provider.  2023 Elsevier/Gold Standard (2020-09-16 00:00:00)

## 2022-01-22 NOTE — Progress Notes (Signed)
HPI Mr. Fellers returns today for followup. I saw him last in 9/21. He has a h/o SVT and underwent EP study and catheter ablation about 3 years ago. 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 He has had recurrent palpitations for which he wore a cardiac monitor which demonstrated episodes of PVC's, PACs, NSVT, and NS SVT. I have reviewed the electrograms. The episodes are quite short lasting a few seconds to up to 12 seconds. The mechanism of his atrial arrhythmias appears to be AT, based on the long RP interval and P wave morphology. He has been on toprol. He has an apical LAD stenosis for which he is being treated medically and he has preserved LV function. He is quite symptomatic from his episodes of palpitations.  Allergies  Allergen Reactions   Rosuvastatin Other (See Comments)    Joint pain   Toradol [Ketorolac Tromethamine] Itching     Current Outpatient Medications  Medication Sig Dispense Refill   acetaminophen (TYLENOL) 500 MG tablet Take 1,000-1,500 mg by mouth at bedtime as needed for moderate pain.     acetaminophen-codeine (TYLENOL #3) 300-30 MG tablet Take 1 tablet by mouth every 8 (eight) hours as needed for moderate pain. 40 tablet 0   atorvastatin (LIPITOR) 40 MG tablet Take 1 tablet (40 mg total) by mouth daily. 90 tablet 3   clopidogrel (PLAVIX) 75 MG tablet TAKE 1 TABLET BY MOUTH ONCE DAILY WITH BREAKFAST. 90 tablet 3   Dulaglutide (TRULICITY) 4.5 MG/0.5ML SOPN INJECT 4.5 MG SUBCUTANEOUSLY ONCE A WEEK 6 mL 3   escitalopram (LEXAPRO) 20 MG tablet Take 1 tablet by mouth once daily 90 tablet 0   flecainide (TAMBOCOR) 50 MG tablet Take 1 tablet (50 mg total) by mouth 2 (two) times daily. 180 tablet 3   gabapentin (NEURONTIN) 300 MG capsule TAKE 1 CAPSULE BY MOUTH THREE TIMES DAILY 90 capsule 5   insulin aspart (NOVOLOG FLEXPEN) 100 UNIT/ML FlexPen Inject 25-35 units 3 times daily with meals (Patient taking differently: Inject 20-25 Units into the skin 3 (three) times daily with meals.)  30 mL 5   insulin detemir (LEVEMIR) 100 UNIT/ML injection INJECT 90 UNITS SUBCUTANEOUSLY AT BEDTIME 30 mL 6   isosorbide mononitrate (IMDUR) 30 MG 24 hr tablet Take 1 tablet (30 mg total) by mouth daily. Needs appointment for further refills 90 tablet 3   levothyroxine (SYNTHROID) 25 MCG tablet TAKE 1 TABLET BY MOUTH ONCE DAILY BEFORE BREAKFAST 90 tablet 0   lisinopril-hydrochlorothiazide (ZESTORETIC) 10-12.5 MG tablet Take 1 tablet by mouth daily. 90 tablet 3   metFORMIN (GLUCOPHAGE-XR) 500 MG 24 hr tablet TAKE 2 TABLETS BY MOUTH TWICE DAILY WITH MEALS 360 tablet 3   metoprolol succinate (TOPROL XL) 50 MG 24 hr tablet Take 50 mg in the morning with or immediately following a meal. Take 25 mg in the evening with or immediately following a meal. 135 tablet 3   nitroGLYCERIN (NITROSTAT) 0.4 MG SL tablet Place 1 tablet (0.4 mg total) under the tongue every 5 (five) minutes as needed. 25 tablet 12   omeprazole (PRILOSEC) 20 MG capsule Take 20 mg by mouth daily.     rOPINIRole (REQUIP) 0.5 MG tablet TAKE 2 TABLETS BY MOUTH AT BEDTIME 180 tablet 0   No current facility-administered medications for this visit.     Past Medical History:  Diagnosis Date   Anxiety    CAD in native artery    a. cath 06/26/2019 - 80% apical LAD >> medical management  Chronic headaches    Chronic neck pain    Coronary artery disease involving native coronary artery of native heart without angina pectoris 11/06/2019   GERD (gastroesophageal reflux disease)    occasional - OTC as needed   History of chicken pox    History of kidney stones    History of shingles    Hypertension    under control with med., had been on med. 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   Past Surgical History:  Procedure Laterality Date   ANTERIOR CERVICAL DECOMP/DISCECTOMY FUSION N/A 01/16/2018   Procedure: ANTERIOR CERVICAL DECOMPRESSION/DISCECTOMY FUSION CERVICAL FOUR-FIVE, CERVICAL FIVE-SIX;  Surgeon: Lisbeth Renshaw, MD;  Location: [facility] OR;  Service: Neurosurgery;  Laterality: N/A;  ANTERIOR CERVICAL DECOMPRESSION/DISCECTOMY FUSION CERVICAL FOUR-FIVE, CERVICAL FIVE-SIX   CHOLECYSTECTOMY  1980's   CYSTOSCOPY/RETROGRADE/URETEROSCOPY/STONE EXTRACTION WITH BASKET Right 08/27/2003   and double J stent placement   ELECTROPHYSIOLOGY STUDY N/A 07/23/2019   Procedure: ELECTROPHYSIOLOGY STUDY;  Surgeon: Marinus Maw, MD;  Location: [facility] IN[facility]AS[facility];  Service: Cardiovascular;  Laterality: N/A;   INGUINAL HERNIA REPAIR Left 1990's   KNEE ARTHROSCOPY Left 1980's   KNEE ARTHROSCOPY Right 2013   KNEE ARTHROSCOPY Right 06/30/2013   Procedure: RIGHT KNEE ARTHROSCOPY AND SYNOVECTOMY;  Surgeon: Velna Ochs, MD;  Location: [facility];  Service: Orthopedics;  Laterality: Right;   LEFT HEART CATH AND CORONARY ANGIOGRAPHY N/A 06/26/2019   Procedure: LEFT HEART CATH AND CORONARY ANGIOGRAPHY;  Surgeon: Marykay Lex, MD;  Location: [facility] INVASIVE CV LAB;  Service: Cardiovascular;  Laterality: N/A;   LITHOTRIPSY     multiple, b/l   SHOULDER ADHESION RELEASE Left ` 1998    SHOULDER ARTHROSCOPY W/ ROTATOR CUFF REPAIR Left 10/20/2004   x 4, had to have part of clavile removed. torn rotator cuff multiple times, had to place screws    SHOULDER ARTHROSCOPY W/ SUPERIOR LABRAL ANTERIOR POSTERIOR LESION REPAIR Left 12/22/2004   SVT ABLATION N/A 07/23/2019   Procedure: SVT ABLATION;  Surgeon: Marinus Maw, MD;  Location: [facility];  Service: Cardiovascular;  Laterality: N/A;   TOTAL HIP ARTHROPLASTY Right 10/10/2021   Procedure: Right TOTAL HIP ARTHROPLASTY ANTERIOR APPROACH;  Surgeon: Kathryne Hitch, MD;  Location: [facility] OR;  Service: Orthopedics;  Laterality: Right;   TOTAL KNEE ARTHROPLASTY Right 12/23/2012   x3, 2 arthroscopies and a TKR,  TOTAL KNEE ARTHROPLASTY;  Surgeon: Velna Ochs, MD;  Location: [facility] OR;  Service: Orthopedics;  Laterality: Right;  DEPUY, RNFA   WRIST SURGERY Right  x 3, torn ligaments, pins placed then infected, then fused with plate     Family History  Problem Relation Age of Onset   COPD Mother        smoker   Hypertension Mother    Heart disease Mother        CHF   Diabetes Father    Heart disease Father        CABG in 77's   Stroke Father    Hypertension Father    Peripheral vascular disease Father    Hypertension Son        tachycardia   Heart disease Maternal Grandfather    Arthritis Paternal Grandmother    Colon cancer Neg Hx    Rectal cancer Neg Hx    Stomach cancer Neg Hx      Social History   Socioeconomic History   Marital status: Single    Spouse name: Not on file   Number of children: 2   Years of education: Not on file   Highest education level: High school graduate  Occupational History   Occupation: Disabled  Tobacco Use   Smoking status: Never   Smokeless tobacco: Current    Types: Snuff  Vaping Use   Vaping Use: Never used  Substance and Sexual Activity   Alcohol use: No   Drug use: No   Sexual activity: Yes  Other Topics Concern   Not on file  Social History Narrative    Lives at home with his father (he takes care of his father)   Right handed   Drinks 2 cups of caffeine daily   Social Determinants of Health   Financial Resource Strain: Not on file  Food Insecurity: Not on file  Transportation Needs: Not on file  Physical Activity: Not on file  Stress: Not on file  Social Connections: Not on file  Intimate Partner Violence: Not on file     BP 102/68   Pulse 85   Ht 6' (1.829 m)   Wt 219 lb (99.3 kg)   SpO2 96%   BMI 29.70 kg/m   Physical Exam:  Well appearing NAD HEENT: Unremarkable Neck:  No JVD, no thyromegally Lymphatics:  No adenopathy Back:  No CVA tenderness Lungs:  Clear with no wheezes HEART:  Regular rate rhythm, no murmurs, no rubs, no clicks Abd:  soft, positive bowel sounds, no organomegally, no rebound, no guarding Ext:  2 plus pulses, no edema, no cyanosis, no clubbing Skin:  No rashes no nodules Neuro:  CN II through XII intact, motor grossly intact  EKG - nsr with a narrow QRS   Assess/Plan:  Recurrent palpitations - I have reviewed his cardiac monitor. There is no evidence of recurrent AVNRT. However he is quite symptomatic. I discussed the treatment options with the patient. I will try a low dose of flecainide. The risk is low. I will have him return for an ECG in 2 weeks. I will also schedule him to undergo a plain exercise test. If he has no evidence of ischemia on the exercise test he should be able to tolerate his flecainide. If there are STT changes or QRS widening with exercise on flecainide, then he will stop the flecainide and he will just have to live with his atrial and ventricular arrhythmias or try sotalol (2 days in the hospital with low likelihood of benefit) or amiodarone (relatively contraindicated with his young age).  CAD - he denies anginal symptoms.  HTN - his bp is well controlled.  We will follow. Dyslipidemia - he will continue lipitor 40 mg daily.  Sharlot Gowda Lesley Atkin,MD

## 2022-01-23 DIAGNOSIS — E119 Type 2 diabetes mellitus without complications: Secondary | ICD-10-CM | POA: Diagnosis not present

## 2022-01-25 ENCOUNTER — Other Ambulatory Visit: Payer: Self-pay | Admitting: Family Medicine

## 2022-01-25 DIAGNOSIS — F419 Anxiety disorder, unspecified: Secondary | ICD-10-CM

## 2022-01-28 ENCOUNTER — Other Ambulatory Visit: Payer: Self-pay | Admitting: Family Medicine

## 2022-01-29 ENCOUNTER — Other Ambulatory Visit: Payer: Self-pay | Admitting: Family Medicine

## 2022-01-29 MED ORDER — ACETAMINOPHEN-CODEINE 300-30 MG PO TABS: ORAL_TABLET | ORAL | 0 refills | Status: DC

## 2022-01-29 NOTE — Telephone Encounter (Signed)
Sent by mistake please refill Tylenol

## 2022-01-30 ENCOUNTER — Ambulatory Visit: Payer: Medicare HMO | Admitting: Orthopaedic Surgery

## 2022-01-30 ENCOUNTER — Encounter: Payer: Self-pay | Admitting: Orthopaedic Surgery

## 2022-01-30 DIAGNOSIS — M1612 Unilateral primary osteoarthritis, left hip: Secondary | ICD-10-CM

## 2022-01-30 DIAGNOSIS — M25552 Pain in left hip: Secondary | ICD-10-CM

## 2022-01-30 DIAGNOSIS — Z96641 Presence of right artificial hip joint: Secondary | ICD-10-CM | POA: Diagnosis not present

## 2022-01-31 ENCOUNTER — Encounter: Payer: Self-pay | Admitting: Family Medicine

## 2022-01-31 NOTE — Telephone Encounter (Signed)
Are you okay wit this?

## 2022-02-05 ENCOUNTER — Ambulatory Visit (INDEPENDENT_AMBULATORY_CARE_PROVIDER_SITE_OTHER): Payer: Medicare HMO | Admitting: Internal Medicine

## 2022-02-05 VITALS — BP 116/78 | HR 90 | Ht 72.0 in

## 2022-02-05 DIAGNOSIS — I471 Supraventricular tachycardia, unspecified: Secondary | ICD-10-CM

## 2022-02-05 NOTE — Progress Notes (Signed)
   Nurse Visit   Date of Encounter: 02/05/2022 ID: LEAM MADERO, DOB [birth year], MRN [id number]  PCP:  Pearline Cables, MD   [facility] Providers Cardiologist:  Thomasene Ripple, DO Electrophysiologist:  Lewayne Bunting, MD      Visit Details   VS:  BP 116/78 (BP Location: Left Arm, Patient Position: Sitting, Cuff Size: Normal)   Pulse 90   Ht 6' (1.829 m)   SpO2 95%   BMI 29.70 kg/m  , BMI Body mass index is 29.7 kg/m.  Wt Readings from Last 3 Encounters:  01/22/22 219 lb (99.3 kg)  12/11/21 220 lb 12.8 oz (100.2 kg)  11/09/21 209 lb (94.8 kg)     Reason for visit: EKG for Flecainide start Performed today: Vitals, EKG, Provider consulted:Thukkani, and Education Changes (medications, testing, etc.) : NONE Length of Visit: 25 minutes    Medications Adjustments/Labs and Tests Ordered: No orders of the defined types were placed in this encounter.  No orders of the defined types were placed in this encounter.  Patient seen for EKG post Flecainide 50mg  BID start for symptomatic SVT. Pt states he is feeling much better since beginning medications, denies any questions/concerns at this time. Per OV note on 01/22/22, 01/24/22 wanted pt to have a POET done and intended to order this. I do not see order in computer and pt states he was unaware of this. Will route to MD to confirm and primary RN so that this can be ordered if necessary. Pt understands we will contact him if so. EKG today shows no QT prolongation per Dr Ladona Ridgel.  Signed, Lynnette Caffey, RN  02/05/2022 10:49 AM

## 2022-02-05 NOTE — Patient Instructions (Signed)
Medication Instructions:  Your physician recommends that you continue on your current medications as directed. Please refer to the Current Medication list given to you today.  *If you need a refill on your cardiac medications before your next appointment, please call your pharmacy*   Lab Work: NONE If you have labs (blood work) drawn today and your tests are completely normal, you will receive your results only by: MyChart Message (if you have MyChart) OR A paper copy in the mail If you have any lab test that is abnormal or we need to change your treatment, we will call you to review the results.   Testing/Procedures: NONE (exercise tolerance test pending)   Follow-Up: At [facility], you and your health needs are our priority.  As part of our continuing mission to provide you with exceptional heart care, we have created designated Provider Care Teams.  These Care Teams include your primary Cardiologist (physician) and Advanced Practice Providers (APPs -  Physician Assistants and Nurse Practitioners) who all work together to provide you with the care you need, when you need it.  We recommend signing up for the patient portal called "MyChart".  Sign up information is provided on this After Visit Summary.  MyChart is used to connect with patients for Virtual Visits (Telemedicine).  Patients are able to view lab/test results, encounter notes, upcoming appointments, etc.  Non-urgent messages can be sent to your provider as well.   To learn more about what you can do with MyChart, go to [email or web address].    Your next appointment:   Regular Follow-up  The format for your next appointment:   In Person  Provider:   Ane Payment   Important Information About Sugar

## 2022-02-09 NOTE — Addendum Note (Signed)
Addended by: Lars Mage on: 02/09/2022 10:38 AM   Modules accepted: Orders

## 2022-02-12 NOTE — Addendum Note (Signed)
Addended by: Daleen Bo I on: 02/12/2022 03:49 PM   Modules accepted: Level of Service

## 2022-02-12 NOTE — Addendum Note (Signed)
Addended by: Daleen Bo I on: 02/12/2022 03:47 PM   Modules accepted: Level of Service

## 2022-02-13 ENCOUNTER — Encounter: Payer: Self-pay | Admitting: Orthopaedic Surgery

## 2022-02-22 DIAGNOSIS — E119 Type 2 diabetes mellitus without complications: Secondary | ICD-10-CM | POA: Diagnosis not present

## 2022-02-25 ENCOUNTER — Other Ambulatory Visit: Payer: Self-pay | Admitting: Family Medicine

## 2022-02-28 ENCOUNTER — Other Ambulatory Visit: Payer: Self-pay

## 2022-03-08 DIAGNOSIS — H524 Presbyopia: Secondary | ICD-10-CM | POA: Diagnosis not present

## 2022-03-15 ENCOUNTER — Encounter: Payer: Self-pay | Admitting: Orthopaedic Surgery

## 2022-03-16 ENCOUNTER — Encounter: Payer: Self-pay | Admitting: *Deleted

## 2022-03-16 DIAGNOSIS — Z006 Encounter for examination for normal comparison and control in clinical research program: Secondary | ICD-10-CM

## 2022-03-16 NOTE — Research (Signed)
Message left for Mr Atkerson to inform him about [employer] research. Encouraged him to call back.

## 2022-03-19 DIAGNOSIS — E118 Type 2 diabetes mellitus with unspecified complications: Secondary | ICD-10-CM | POA: Diagnosis not present

## 2022-03-20 ENCOUNTER — Other Ambulatory Visit: Payer: Self-pay | Admitting: Physician Assistant

## 2022-03-20 DIAGNOSIS — M1612 Unilateral primary osteoarthritis, left hip: Secondary | ICD-10-CM

## 2022-03-22 ENCOUNTER — Other Ambulatory Visit: Payer: Self-pay | Admitting: Family Medicine

## 2022-03-24 DIAGNOSIS — E119 Type 2 diabetes mellitus without complications: Secondary | ICD-10-CM | POA: Diagnosis not present

## 2022-03-27 NOTE — Pre-Procedure Instructions (Signed)
Surgical Instructions    Your procedure is scheduled on Tuesday August 29th.  Report to [facility] Main Entrance "A" at 5:30 A.M., then check in with the Admitting office.  Call this number if you have problems the morning of surgery:  [phone]   If you have any questions prior to your surgery date call [phone]: Open Monday-Friday 8am-4pm    Remember:  Do not eat after midnight the night before your surgery  You may drink clear liquids until 4:30 a.m. the morning of your surgery.   Clear liquids allowed are: Water, Non-Citrus Juices (without pulp), Carbonated Beverages, Clear Tea, Black Coffee ONLY (NO MILK, CREAM OR POWDERED CREAMER of any kind), and Gatorade  Patient Instructions   The day of surgery (if you have diabetes):  Drink ONE small 12 oz bottle of Gatorade G2 by 4:30 am the morning of surgery This bottle was given to you during your hospital  pre-op appointment visit.  Nothing else to drink after completing the  Small 12 oz bottle of Gatorade G2.         If you have questions, please contact your surgeon's office.     Take these medicines the morning of surgery with A SIP OF WATER:  atorvastatin (LIPITOR) flecainide (TAMBOCOR) gabapentin (NEURONTIN) isosorbide mononitrate (IMDUR)  levothyroxine (SYNTHROID) metoprolol succinate (TOPROL XL)  IF NEEDED: acetaminophen (TYLENOL) or acetaminophen-codeine (TYLENOL #3) nitroGLYCERIN (NITROSTAT)- let your nurse know if you take this the day of surgery  Follow your surgeon's instructions on when to stop clopidogrel (PLAVIX).  If no instructions were given by your surgeon then you will need to call the office to get those instructions.    As of today, STOP taking any Aspirin (unless otherwise instructed by your surgeon) Aleve, Naproxen, Ibuprofen, Motrin, Advil, Goody's, BC's, all herbal medications, fish oil, and all vitamins.   WHAT DO I DO ABOUT MY DIABETES MEDICATION?  Do not takemetFORMIN  (GLUCOPHAGE-XR) the morning of surgery.  THE NIGHT BEFORE SURGERY, take 45 units of insulin detemir (LEVEMIR) insulin.      The day of surgery, do not take other diabetes injectables, including Trulicity (dulaglutide).  If your CBG is greater than 220 mg/dL the morning of surgery, you may take  of your sliding scale (correction) dose of insulin aspart (NOVOLOG FLEXPEN) insulin.   HOW TO MANAGE YOUR DIABETES BEFORE AND AFTER SURGERY  Why is it important to control my blood sugar before and after surgery? Improving blood sugar levels before and after surgery helps healing and can limit problems. A way of improving blood sugar control is eating a healthy diet by:  Eating less sugar and carbohydrates  Increasing activity/exercise  Talking with your doctor about reaching your blood sugar goals High blood sugars (greater than 180 mg/dL) can raise your risk of infections and slow your recovery, so you will need to focus on controlling your diabetes during the weeks before surgery. Make sure that the doctor who takes care of your diabetes knows about your planned surgery including the date and location.  How do I manage my blood sugar before surgery? Check your blood sugar at least 4 times a day, starting 2 days before surgery, to make sure that the level is not too high or low.  Check your blood sugar the morning of your surgery when you wake up and every 2 hours until you get to the Short Stay unit.  If your blood sugar is less than 70 mg/dL, you will need to treat for low  blood sugar: Do not take insulin. Treat a low blood sugar (less than 70 mg/dL) with  cup of clear juice (cranberry or apple), 4 glucose tablets, OR glucose gel. Recheck blood sugar in 15 minutes after treatment (to make sure it is greater than 70 mg/dL). If your blood sugar is not greater than 70 mg/dL on recheck, call [phone] for further instructions. Report your blood sugar to the short stay nurse when you get to  Short Stay.  If you are admitted to the hospital after surgery: Your blood sugar will be checked by the staff and you will probably be given insulin after surgery (instead of oral diabetes medicines) to make sure you have good blood sugar levels. The goal for blood sugar control after surgery is 80-180 mg/dL.           Do not wear jewelry. Do not wear lotions, powders, cologne or deodorant. Men may shave face and neck. Do not bring valuables to the hospital.  [facility] is not responsible for any belongings or valuables.    Do NOT Smoke (Tobacco/Vaping)  24 hours prior to your procedure  If you use a CPAP at night, you may bring your mask for your overnight stay.   Contacts, glasses, hearing aids, dentures or partials may not be worn into surgery, please bring cases for these belongings   For patients admitted to the hospital, discharge time will be determined by your treatment team.   Patients discharged the day of surgery will not be allowed to drive home, and someone needs to stay with them for 24 hours.   SURGICAL WAITING ROOM VISITATION Patients having surgery or a procedure may have no more than 2 support people in the waiting area - these visitors may rotate.   Children under the age of 25 must have an adult with them who is not the patient. If the patient needs to stay at the hospital during part of their recovery, the visitor guidelines for inpatient rooms apply. Pre-op nurse will coordinate an appropriate time for 1 support person to accompany patient in pre-op.  This support person may not rotate.   Please refer to the [facility] website for the visitor guidelines for Inpatients (after your surgery is over and you are in a regular room).    Special instructions:    Oral Hygiene is also important to reduce your risk of infection.  Remember - BRUSH YOUR TEETH THE MORNING OF SURGERY WITH YOUR REGULAR TOOTHPASTE   [place]- Preparing For Surgery  Before surgery, you  can play an important role. Because skin is not sterile, your skin needs to be as free of germs as possible. You can reduce the number of germs on your skin by washing with CHG (chlorahexidine gluconate) Soap before surgery.  CHG is an antiseptic cleaner which kills germs and bonds with the skin to continue killing germs even after washing.     Please do not use if you have an allergy to CHG or antibacterial soaps. If your skin becomes reddened/irritated stop using the CHG.  Do not shave (including legs and underarms) for at least 48 hours prior to first CHG shower. It is OK to shave your face.  Please follow these instructions carefully.     Shower the NIGHT BEFORE SURGERY and the MORNING OF SURGERY with CHG Soap.   If you chose to wash your hair, wash your hair first as usual with your normal shampoo. After you shampoo, rinse your hair and body thoroughly  to remove the shampoo.  Then [employer] and genitals (private parts) with your normal soap and rinse thoroughly to remove soap.  After that Use CHG Soap as you would any other liquid soap. You can apply CHG directly to the skin and wash gently with a scrungie or a clean washcloth.   Apply the CHG Soap to your body ONLY FROM THE NECK DOWN.  Do not use on open wounds or open sores. Avoid contact with your eyes, ears, mouth and genitals (private parts). Wash Face and genitals (private parts)  with your normal soap.   Wash thoroughly, paying special attention to the area where your surgery will be performed.  Thoroughly rinse your body with warm water from the neck down.  DO NOT shower/wash with your normal soap after using and rinsing off the CHG Soap.  Pat yourself dry with a CLEAN TOWEL.  Wear CLEAN PAJAMAS to bed the night before surgery  Place CLEAN SHEETS on your bed the night before your surgery  DO NOT SLEEP WITH PETS.   Day of Surgery:  Take a shower with CHG soap. Wear Clean/Comfortable clothing the morning of surgery Do  not apply any deodorants/lotions.   Remember to brush your teeth WITH YOUR REGULAR TOOTHPASTE.    If you received a COVID test during your pre-op visit, it is requested that you wear a mask when out in public, stay away from anyone that may not be feeling well, and notify your surgeon if you develop symptoms. If you have been in contact with anyone that has tested positive in the last 10 days, please notify your surgeon.    Please read over the following fact sheets that you were given.

## 2022-03-28 ENCOUNTER — Encounter (HOSPITAL_COMMUNITY): Payer: Self-pay

## 2022-03-28 ENCOUNTER — Other Ambulatory Visit: Payer: Self-pay

## 2022-03-28 ENCOUNTER — Encounter (HOSPITAL_COMMUNITY)
Admission: RE | Admit: 2022-03-28 | Discharge: 2022-03-28 | Disposition: A | Discharge status: Home / Self Care | Payer: Medicare HMO | Source: Ambulatory Visit | Attending: Orthopaedic Surgery | Admitting: Orthopaedic Surgery

## 2022-03-28 VITALS — BP 130/86 | HR 96 | Temp 98.4°F | Resp 18 | Ht 72.0 in | Wt 225.8 lb

## 2022-03-28 DIAGNOSIS — Z794 Long term (current) use of insulin: Secondary | ICD-10-CM | POA: Diagnosis not present

## 2022-03-28 DIAGNOSIS — M1612 Unilateral primary osteoarthritis, left hip: Secondary | ICD-10-CM | POA: Insufficient documentation

## 2022-03-28 DIAGNOSIS — E119 Type 2 diabetes mellitus without complications: Secondary | ICD-10-CM | POA: Diagnosis not present

## 2022-03-28 DIAGNOSIS — Z01812 Encounter for preprocedural laboratory examination: Secondary | ICD-10-CM | POA: Diagnosis not present

## 2022-03-28 DIAGNOSIS — Z01818 Encounter for other preprocedural examination: Secondary | ICD-10-CM

## 2022-03-28 LAB — BASIC METABOLIC PANEL
Anion gap: 9 (ref 5–15)
BUN: 13 mg/dL (ref 6–20)
CO2: 24 mmol/L (ref 22–32)
Calcium: 9.1 mg/dL (ref 8.9–10.3)
Chloride: 101 mmol/L (ref 98–111)
Creatinine, Ser: 1.16 mg/dL (ref 0.61–1.24)
GFR, Estimated: >60 mL/min (ref >60)
Glucose, Bld: 244 mg/dL — ABNORMAL HIGH (ref 70–99)
Potassium: 4.5 mmol/L (ref 3.5–5.1)
Sodium: 134 mmol/L — ABNORMAL LOW (ref 135–145)

## 2022-03-28 LAB — TYPE AND SCREEN
ABO/RH(D): O POS
Antibody Screen: NEGATIVE

## 2022-03-28 LAB — SURGICAL PCR SCREEN
MRSA, PCR: NEGATIVE
Staphylococcus aureus: NEGATIVE

## 2022-03-28 LAB — CBC
HCT: 38.3 % — ABNORMAL LOW (ref 39.0–52.0)
Hemoglobin: 13.9 g/dL (ref 13.0–17.0)
MCH: 32 pg (ref 26.0–34.0)
MCHC: 36.3 g/dL — ABNORMAL HIGH (ref 30.0–36.0)
MCV: 88 fL (ref 80.0–100.0)
Platelets: 287 10*3/uL (ref 150–400)
RBC: 4.35 MIL/uL (ref 4.22–5.81)
RDW: 12.5 % (ref 11.5–15.5)
WBC: 7.9 10*3/uL (ref 4.0–10.5)
nRBC: 0 % (ref 0.0–0.2)

## 2022-03-28 LAB — HEMOGLOBIN A1C
Hgb A1c MFr Bld: 9 % — ABNORMAL HIGH (ref 4.8–5.6)
Mean Plasma Glucose: 211.6 mg/dL

## 2022-03-28 LAB — GLUCOSE, CAPILLARY: Glucose-Capillary: 267 mg/dL — ABNORMAL HIGH (ref 70–99)

## 2022-03-28 NOTE — Progress Notes (Addendum)
PCP - Dr. Warner Mccreedy Cardiologist - Dr. Thomasene Ripple  PPM/ICD - n/a Device Orders - n/a Rep Notified - n/a  Chest x-ray - n/a EKG - 02/05/22 Stress Test - denies ECHO - 05/12/20 Cardiac Cath - 06/26/19  Sleep Study - denies CPAP - n/a  CBG today- 267. Had Diet Sun Drop, Diet Pepsi, bacon, eggs, and a sandwich today.  DexCom continuous monitor Range 130's-140's per patient  Blood Thinner Instructions: Patient states he took last dose of Plavix on 8/19 per his cardiologist instructions.  Aspirin Instructions:n/a  ERAS Protcol - Clear liquids until 0530 day of surgery.  PRE-SURGERY Ensure or G2- G2  COVID TEST- n/a   Anesthesia review: Yes. CBG 267 at PAT visit. A1 C checked today.  A1 C 9.0  Patient denies shortness of breath, fever, cough and chest pain at PAT appointment   All instructions explained to the patient, with a verbal understanding of the material. Patient agrees to go over the instructions while at home for a better understanding. The opportunity to ask questions was provided.

## 2022-03-29 ENCOUNTER — Encounter (HOSPITAL_COMMUNITY): Payer: Self-pay | Admitting: Physician Assistant

## 2022-03-29 NOTE — Progress Notes (Signed)
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  Last seen by Dr. Ladona Ridgel 01/22/2022 and noted to be very symptomatic from his palpitations.  Patient was started on a low-dose of flecainide.  Is also recommended the patient have an exercise stress test.  Patient did have a nurse visit follow-up on 02/05/2022 for EKG to monitor for any changes since starting flecainide.  Tracings showed sinus rhythm with marked sinus arrhythmia, no significant changes from prior.  Patient had not yet had exercise stress test and nurse reached out to Dr. Ladona Ridgel for input.  Per telephone encounter, Dr. Ladona Ridgel advised that he would discuss this with patient at next office visit.  Preop labs reviewed, markedly uncontrolled insulin-dependent DM2, A1c 9.0.    I called Dr. Magnus Ivan surgical scheduler to discuss both uncontrolled diabetes and pending cardiac studies.  Patient case to be postponed as he needs to improve A1c.  He will also need cardiology input prior to undergoing elective surgery.  EKG 02/05/2022: Sinus rhythm with marked sinus arrhythmia.  Rate 84.  Event monitor 11/10/2021: Patient had a min HR of 67 bpm, max HR of 222 bpm, and avg HR of 94 bpm. Predominant underlying rhythm was Sinus Rhythm.    1 run of Ventricular Tachycardia occurred lasting 4 beats with a max rate of 167 bpm (avg 160 bpm).    56 Supraventricular Tachycardia runs occurred, the run with the fastest interval lasting 4 beats with a max rate of 222 bpm, the longest lasting 12.0 secs with an avg rate of 126 bpm. Isolated SVEs were occasional (3.3%, 22224), SVE Couplets were rare (<1.0%, 1329), and SVE Triplets  were rare (<1.0%, 182). Isolated VEs were rare (<1.0%),  VE Couplets were rare (<1.0%), and no VE Triplets were present.      Study is remarkable for the following:                       1.  Rare episodes of nonsustained ventricular tachycardia.                       2.  Paroxysmal supraventricular tachycardia                       3.  Occasional premature atrial complexes.  TTE 05/12/2020:  1. Left ventricular ejection fraction, by estimation, is 60 to 65%. The  left ventricle has normal function. The left ventricle has no regional  wall motion abnormalities. There is moderate left ventricular hypertrophy.  Left ventricular diastolic  parameters are consistent with Grade I diastolic dysfunction (impaired  relaxation).   2. Right ventricular systolic function is normal. The right ventricular  size is normal. There is normal pulmonary artery systolic pressure.   3. The mitral valve is normal in structure. No evidence of mitral valve  regurgitation. No evidence of mitral stenosis.   4. The aortic valve is tricuspid. Aortic valve regurgitation is not  visualized. No aortic stenosis is present.   5. The inferior vena cava is normal in size with greater than 50%  respiratory variability, suggesting right atrial pressure of 3 mmHg.   Cath 06/26/2019: SUMMARY Single-vessel CAD with diffuse spasm of the  apical LAD with a focal lesion of roughly 80% near the apex followed by an extensive diffuse 50 to 55% stenosis around the apex as poor runoff) -> not a good candidate for PCI based on the apical nature and small caliber vessel at this point. Likely culprit lesion is the apical LAD 80% stenosis. ->  Best treated medically. Mid apical LAD spasm somewhat alleviated with nitroglycerin IC.Marland Kitchen Low normal to mildly reduced LVEF with anterior apical and apical hypokinesis and high normal LVEDP     RECOMMENDATIONS Admit to telemetry, will start IV heparin 8 hours after sheath removal. Add amlodipine for blood pressure control due to spasm Will use IV  nitroglycerin for blood pressure control, and switch to Imdur on discharge based on spasm Would recommend at least 3 to 6 months of clopidogrel given ACS presentation.   Zannie Cove [facility]/Anesthesiology Phone [phone] 03/29/2022 10:53 AM

## 2022-04-03 ENCOUNTER — Ambulatory Visit (HOSPITAL_COMMUNITY): Admission: RE | Admit: 2022-04-03 | Payer: Medicare HMO | Source: Home / Self Care | Admitting: Orthopaedic Surgery

## 2022-04-03 ENCOUNTER — Encounter (HOSPITAL_COMMUNITY): Admission: RE | Payer: Self-pay | Source: Home / Self Care

## 2022-04-03 ENCOUNTER — Other Ambulatory Visit: Payer: Self-pay | Admitting: Family Medicine

## 2022-04-03 DIAGNOSIS — G2581 Restless legs syndrome: Secondary | ICD-10-CM

## 2022-04-03 SURGERY — ARTHROPLASTY, HIP, TOTAL, ANTERIOR APPROACH
Anesthesia: Spinal | Site: Hip | Laterality: Left

## 2022-04-11 ENCOUNTER — Encounter: Payer: Self-pay | Admitting: Family Medicine

## 2022-04-11 ENCOUNTER — Telehealth: Payer: Self-pay | Admitting: Family Medicine

## 2022-04-11 NOTE — Telephone Encounter (Signed)
Patient was called to Triage symptoms, patient called back. He states that he is having really bad tremors in his lip and both hands. Tremors are non stop.he is having no other symptoms and his Blood Pressure is normal. He has had tremors before but not this bad and not in his lip. Sent to Triage.

## 2022-04-11 NOTE — Telephone Encounter (Signed)
Nurse Assessment Nurse: Vaughan Browner, RN, Marchelle Folks Date/Time (Eastern Time): 04/11/2022 2:01:55 PM Confirm and document reason for call. If symptomatic, describe symptoms. ---caller states he has tremors in both hands and in bottom lip for a week. getting worse. HR 74 and bp 126/82 Does the patient have any new or worsening symptoms? ---Yes Will a triage be completed? ---Yes Related visit to physician within the last 2 weeks? ---No Does the PT have any chronic conditions? (i.e. diabetes, asthma, this includes High risk factors for pregnancy, etc.) ---Yes List chronic conditions. ---DM, tachycardia, Is this a behavioral health or substance abuse call? ---No Guidelines Guideline Title Affirmed Question Affirmed Notes Nurse Date/Time (Eastern Time) Muscle Jerks - Tics - Shudders [1] New-onset muscle jerks (twitches, spasms) AND [2] present now Humfleet, RN, Marchelle Folks 04/11/2022 2:03:45 PM Disp. Time Lamount Cohen Time) Disposition Final User 04/11/2022 1:49:46 PM Attempt made - message left Humfleet, RN, Marchelle Folks 04/11/2022 2:05:09 PM Go to ED Now (or PCP triage) Yes Humfleet, RN, Marchelle Folks PLEASE NOTE: All timestamps contained within this report are represented as [place] Standard Time. CONFIDENTIALTY NOTICE: This fax transmission is intended only for the addressee. It contains information that is legally privileged, confidential or otherwise protected from use or disclosure. If you are not the intended recipient, you are strictly prohibited from reviewing, disclosing, copying using or disseminating any of this information or taking any action in reliance on or regarding this information. If you have received this fax in error, please notify us immediately by telephone so that we can arrange for its return to [place]. Phone: [phone], Toll-Free: [phone], Fax: [phone] Page: 2 of 2 Call Id: [id number] Final Disposition 04/11/2022 2:05:09 PM Go to ED Now (or PCP triage) Yes Humfleet, RN, Earnestine Leys  Disagree/Comply Comply Caller Understands Yes PreDisposition InappropriateToAsk Care Advice Given Per Guideline GO TO ED NOW (OR PCP TRIAGE): * IF NO PCP (PRIMARY CARE PROVIDER) SECOND-LEVEL TRIAGE: You need to be seen within the next hour. Go to the ED/UCC at _____________ Hospital. Leave as soon as you can. CARE ADVICE given per Muscle Jerks - Tics - Shudders (Adult) guideline. Referrals GO TO FACILITY OTHER - SPECIFY

## 2022-04-11 NOTE — Telephone Encounter (Signed)
Called patient and left message on machine, asking what ended up happening and if we can be of any service.  We will send him a MyChart message

## 2022-04-16 ENCOUNTER — Other Ambulatory Visit: Payer: Self-pay | Admitting: Family Medicine

## 2022-04-16 DIAGNOSIS — F419 Anxiety disorder, unspecified: Secondary | ICD-10-CM

## 2022-04-17 ENCOUNTER — Encounter: Payer: Self-pay | Admitting: Internal Medicine

## 2022-04-17 ENCOUNTER — Ambulatory Visit: Payer: Medicare HMO | Attending: Internal Medicine | Admitting: Internal Medicine

## 2022-04-17 ENCOUNTER — Encounter: Payer: Medicare HMO | Admitting: Orthopaedic Surgery

## 2022-04-17 VITALS — BP 100/83 | HR 83 | Ht 72.0 in | Wt 225.0 lb

## 2022-04-17 DIAGNOSIS — I10 Essential (primary) hypertension: Secondary | ICD-10-CM | POA: Diagnosis not present

## 2022-04-17 DIAGNOSIS — R002 Palpitations: Secondary | ICD-10-CM | POA: Diagnosis not present

## 2022-04-17 DIAGNOSIS — I2510 Atherosclerotic heart disease of native coronary artery without angina pectoris: Secondary | ICD-10-CM

## 2022-04-17 NOTE — Progress Notes (Unsigned)
HPI Sean Horn returns today for followup. I saw him last in 9/21. He has a h/o SVT and underwent EP study and catheter ablation about 3 years ago. 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 He has had recurrent palpitations for which he wore a cardiac monitor which demonstrated episodes of PVC's, PACs, NSVT, and NS SVT. I have reviewed the electrograms. The episodes are quite short lasting a few seconds to up to 12 seconds. The mechanism of his atrial arrhythmias appears to be AT, based on the long RP interval and P wave morphology. He has been on toprol. He has an apical LAD stenosis for which he is being treated medically and he has preserved LV function. He is quite symptomatic from his episodes of palpitations.  Allergies  Allergen Reactions   Rosuvastatin Other (See Comments)    Joint pain   Toradol [Ketorolac Tromethamine] Itching     Current Outpatient Medications  Medication Sig Dispense Refill   acetaminophen (TYLENOL) 500 MG tablet Take 1,000-1,500 mg by mouth 2 (two) times daily as needed for moderate pain.     acetaminophen-codeine (TYLENOL #3) 300-30 MG tablet TAKE 1 TO 2 TABLETS BY MOUTH EVERY 6 HOURS AS NEEDED FOR MODERATE PAIN (Patient taking differently: Take 1 tablet by mouth See admin instructions. Take 1 tablet at bedtime, may take a second tablet during the day as needed for pain) 40 tablet 1   atorvastatin (LIPITOR) 40 MG tablet Take 1 tablet (40 mg total) by mouth daily. (Patient taking differently: Take 40 mg by mouth at bedtime.) 90 tablet 3   clopidogrel (PLAVIX) 75 MG tablet TAKE 1 TABLET BY MOUTH ONCE DAILY WITH BREAKFAST. 90 tablet 3   Dulaglutide (TRULICITY) 4.5 MG/0.5ML SOPN INJECT 1 SYRINGE SUBCUTANEOUSLY ONCE A WEEK 12 mL 0   escitalopram (LEXAPRO) 20 MG tablet Take 1 tablet by mouth once daily 30 tablet 0   flecainide (TAMBOCOR) 50 MG tablet Take 1 tablet (50 mg total) by mouth 2 (two) times daily. 180 tablet 3   gabapentin (NEURONTIN) 300 MG capsule TAKE 1 CAPSULE BY  MOUTH THREE TIMES DAILY 90 capsule 5   insulin aspart (NOVOLOG FLEXPEN) 100 UNIT/ML FlexPen Inject 25-35 units 3 times daily with meals (Patient taking differently: Inject 25 Units into the skin 2 (two) times daily with a meal.) 30 mL 5   insulin detemir (LEVEMIR) 100 UNIT/ML injection INJECT 90 UNITS SUBCUTANEOUSLY AT BEDTIME 30 mL 0   isosorbide mononitrate (IMDUR) 30 MG 24 hr tablet Take 1 tablet (30 mg total) by mouth daily. Needs appointment for further refills 90 tablet 3   levothyroxine (SYNTHROID) 25 MCG tablet TAKE 1 TABLET BY MOUTH ONCE DAILY BEFORE BREAKFAST 90 tablet 0   lisinopril-hydrochlorothiazide (ZESTORETIC) 10-12.5 MG tablet Take 1 tablet by mouth daily. 90 tablet 3   metFORMIN (GLUCOPHAGE-XR) 500 MG 24 hr tablet TAKE 2 TABLETS BY MOUTH TWICE DAILY WITH MEALS 360 tablet 3   metoprolol succinate (TOPROL XL) 50 MG 24 hr tablet Take 50 mg in the morning with or immediately following a meal. Take 25 mg in the evening with or immediately following a meal. 135 tablet 3   nitroGLYCERIN (NITROSTAT) 0.4 MG SL tablet Place 1 tablet (0.4 mg total) under the tongue every 5 (five) minutes as needed. 25 tablet 12   omeprazole (PRILOSEC) 20 MG capsule Take 20 mg by mouth at bedtime.     rOPINIRole (REQUIP) 0.5 MG tablet Take 2 tablets (1 mg total) by mouth at bedtime.  180 tablet 1   No current facility-administered medications for this visit.     Past Medical History:  Diagnosis Date   Anxiety    CAD in native artery    a. cath 06/26/2019 - 80% apical LAD >> medical management    Chronic headaches    Chronic neck pain    Coronary artery disease involving native coronary artery of native heart without angina pectoris 11/06/2019   GERD (gastroesophageal reflux disease)    occasional - OTC as needed   History of chicken pox    History of kidney stones    History of shingles    Hypertension    under control with med., had been on med. 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   Past Surgical History:  Procedure Laterality Date   ANTERIOR CERVICAL DECOMP/DISCECTOMY FUSION N/A 01/16/2018   Procedure: ANTERIOR CERVICAL DECOMPRESSION/DISCECTOMY FUSION CERVICAL FOUR-FIVE, CERVICAL FIVE-SIX;  Surgeon: Lisbeth Renshaw, MD;  Location: [facility] OR;  Service: Neurosurgery;  Laterality: N/A;  ANTERIOR CERVICAL DECOMPRESSION/DISCECTOMY FUSION CERVICAL FOUR-FIVE, CERVICAL FIVE-SIX   CHOLECYSTECTOMY  1980's   CYSTOSCOPY/RETROGRADE/URETEROSCOPY/STONE EXTRACTION WITH BASKET Right 08/27/2003   and double J stent placement   ELECTROPHYSIOLOGY STUDY N/A 07/23/2019   Procedure: ELECTROPHYSIOLOGY STUDY;  Surgeon: Marinus Maw, MD;  Location: [facility];  Service: Cardiovascular;  Laterality: N/A;   INGUINAL HERNIA REPAIR Left 1990's   KNEE ARTHROSCOPY Left 1980's   KNEE ARTHROSCOPY Right 2013   KNEE ARTHROSCOPY Right 06/30/2013   Procedure: RIGHT KNEE ARTHROSCOPY AND SYNOVECTOMY;  Surgeon: Velna Ochs, MD;  Location: [facility];  Service: Orthopedics;  Laterality: Right;   LEFT HEART CATH AND CORONARY ANGIOGRAPHY N/A 06/26/2019    Procedure: LEFT HEART CATH AND CORONARY ANGIOGRAPHY;  Surgeon: Marykay Lex, MD;  Location: [facility];  Service: Cardiovascular;  Laterality: N/A;   LITHOTRIPSY     multiple, b/l   SHOULDER ADHESION RELEASE Left ` 1998   SHOULDER ARTHROSCOPY W/ ROTATOR CUFF REPAIR Left 10/20/2004   x 4, had to have part of clavile removed. torn rotator cuff multiple times, had to place screws    SHOULDER ARTHROSCOPY W/ SUPERIOR LABRAL ANTERIOR POSTERIOR LESION REPAIR Left 12/22/2004   SVT ABLATION N/A 07/23/2019   Procedure: SVT ABLATION;  Surgeon: Marinus Maw, MD;  Location: [facility];  Service: Cardiovascular;  Laterality: N/A;   TOTAL HIP ARTHROPLASTY Right 10/10/2021   Procedure: Right TOTAL HIP ARTHROPLASTY ANTERIOR APPROACH;  Surgeon: Kathryne Hitch, MD;  Location: [facility] OR;  Service: Orthopedics;  Laterality: Right;   TOTAL KNEE ARTHROPLASTY Right 12/23/2012   x3, 2 arthroscopies and a TKR,  TOTAL KNEE ARTHROPLASTY;  Surgeon: Velna Ochs, MD;  Location: [facility] OR;  Service: Orthopedics;  Laterality: Right;  DEPUY, RNFA   WRIST SURGERY Right    x 3, torn ligaments, pins placed then infected, then fused with plate     Family History  Problem Relation Age of Onset   COPD Mother        smoker   Hypertension Mother    Heart disease Mother        CHF   Diabetes Father    Heart disease Father        CABG in 54's   Stroke Father    Hypertension Father    Peripheral vascular disease Father    Hypertension Son        tachycardia   Heart disease Maternal Grandfather    Arthritis Paternal Grandmother    Colon cancer Neg Hx    Rectal cancer Neg Hx    Stomach cancer Neg Hx      Social History   Socioeconomic History   Marital status: Single    Spouse name: Not on file   Number of children: 2   Years of education: Not on file   Highest education level: High school graduate  Occupational History   Occupation: Disabled  Tobacco Use   Smoking status: Never    Smokeless tobacco: Current    Types: Snuff  Vaping Use   Vaping Use: Never used  Substance and Sexual Activity   Alcohol use: No   Drug use: No   Sexual activity: Yes  Other Topics Concern   Not on file  Social History Narrative   Lives at home with his father (he takes care of his father)   Right handed   Drinks 2 cups of caffeine daily   Social Determinants of Health   Financial Resource Strain: Not on file  Food Insecurity: Not on file  Transportation Needs: Not on file  Physical Activity: Not on file  Stress: Not on file  Social Connections: Not on file  Intimate Partner Violence: Not on file     BP 100/83   Pulse 83   Ht 6' (1.829 m)   Wt 225 lb (102.1 kg)   SpO2 96%   BMI 30.52 kg/m   Physical Exam:  Well appearing NAD HEENT: Unremarkable Neck:  No JVD, no thyromegally Lymphatics:  No adenopathy Back:  No CVA tenderness Lungs:  Clear HEART:  Regular rate rhythm, no murmurs, no rubs, no clicks Abd:  soft, positive bowel sounds, no organomegally, no rebound, no guarding Ext:  2 plus pulses, no edema, no cyanosis, no clubbing Skin:  No rashes no nodules Neuro:  CN II through XII intact, motor grossly intact  EKG  DEVICE  Normal device function.  See PaceArt for details.   Assess/Plan: Recurrent palpitations - I have reviewed his cardiac monitor. There is no evidence of recurrent AVNRT. However he is quite symptomatic. I discussed the treatment options with the patient. I will try a low dose of flecainide. The risk is low. I will have him return for an ECG in 2 weeks. I will also schedule him to undergo a plain exercise test. If he has no evidence of ischemia on the exercise test he should be able to tolerate his flecainide. If there are STT changes or QRS widening with exercise on flecainide, then he will stop the flecainide  and he will just have to live with his atrial and ventricular arrhythmias or try sotalol (2 days in the hospital with low likelihood of  benefit) or amiodarone (relatively contraindicated with his young age).  CAD - he denies anginal symptoms.  HTN - his bp is well controlled. We will follow. Dyslipidemia - he will continue lipitor 40 mg daily.   Sharlot Gowda Jaylei Fuerte,MD

## 2022-04-17 NOTE — Patient Instructions (Addendum)
Medication Instructions:  Your physician recommends that you continue on your current medications as directed. Please refer to the Current Medication list given to you today.  *If you need a refill on your cardiac medications before your next appointment, please call your pharmacy*  Lab Work: None ordered.  If you have labs (blood work) drawn today and your tests are completely normal, you will receive your results only by: MyChart Message (if you have MyChart) OR A paper copy in the mail If you have any lab test that is abnormal or we need to change your treatment, we will call you to review the results.  Testing/Procedures: Please schedule an Exercise Treadmill Stress Test in early February, 2024.  Please schedule a follow up appointment with Dr. Rosette Reveal, AFTER the results of the Exercise Treadmill Stress Test results are available.    Follow-Up: At [facility], you and your health needs are our priority.  As part of our continuing mission to provide you with exceptional heart care, we have created designated Provider Care Teams.  These Care Teams include your primary Cardiologist (physician) and Advanced Practice Providers (APPs -  Physician Assistants and Nurse Practitioners) who all work together to provide you with the care you need, when you need it.  We recommend signing up for the patient portal called "MyChart".  Sign up information is provided on this After Visit Summary.  MyChart is used to connect with patients for Virtual Visits (Telemedicine).  Patients are able to view lab/test results, encounter notes, upcoming appointments, etc.  Non-urgent messages can be sent to your provider as well.   To learn more about what you can do with MyChart, go to [email or web address].    Your next appointment:   After your Exercise Treadmill Stress Test is complete, and results are ready. You will then see Dr. Rosette Reveal.  The format for your next appointment:   In  Person  Provider:   Lewayne Bunting, MD{or one of the following Advanced Practice Providers on your designated Care Team:   Francis Dowse, [place] Casimiro Needle "Mardelle Matte" Lanna Poche, [place]   Important Information About Sugar      Exercise Stress Test An exercise stress test is done to collect information about how your heart functions during exercise. The test is done while you are walking on a treadmill or using a stationary bike. The goal is to raise your heart rate and "stress" the heart. The heart is evaluated before, during, and after you exercise. An electrocardiogram (ECG) will be used to monitor the heart, and your blood pressure will also be monitored. In some cases, nuclear scanning or an ultrasound of the heart (echocardiogram) will also be done to evaluate your heart. An exercise stress test is done to look for coronary artery disease (CAD). The test may also be done to: Evaluate your limits of exercise during cardiac rehabilitation. Check for high blood pressure during exercise. Check how well you can exercise after such treatments as coronary stenting or new medicines. Check for problems with blood flow to your arms and legs during exercise. If you have an abnormal test result, this may mean that you are not getting enough blood flow to your heart during exercise. More testing may be needed to understand why your test was not normal. Tell a health care provider about: Any allergies you have. All medicines you are taking, including vitamins, herbs, eye drops, creams, and over-the-counter medicines. Any surgeries you have had, especially if you have an implantable cardioverter defibrillator (ICD)  or pacemaker. Any bleeding problems you have. Any medical conditions you have. Whether you are pregnant or may be pregnant. What are the risks? Generally, this is a safe test. However, problems may occur, including: Pain or pressure in the following areas: Chest. Jaw or neck. Between your shoulder  blades. Down your left arm. Legs (claudication). Dizziness or light-headedness. Shortness of breath. Irregular heartbeat (arrhythmia). Nausea or vomiting. What happens before the test? Follow instructions from your health care provider about eating or drinking restrictions. You may be told to avoid all forms of caffeine for 24 hours before the test. This includes coffee, tea (even decaffeinated tea), caffeinated sodas, chocolate, cocoa, and certain pain medicines. Ask your health care provider about: Taking over-the-counter medicines, vitamins, herbs, and supplements. Changing or stopping your regular medicines. This is especially important if you are taking diabetes medicines or beta-blocker medicines. If you have diabetes, ask how you are to take your insulin or pills. It is common to adjust your insulin dose the morning of the test. If you are taking beta-blocker medicines, it is important to talk to your health care provider about these medicines well before the date of your test. Taking beta-blocker medicines may interfere with the test. In some cases, these medicines may need to be changed or stopped 24 hours or more before the test. If you wear a nitroglycerin patch, it may need to be removed prior to the test. Ask your health care provider if the patch should be removed before the test. Do not use any products that contain nicotine or tobacco for 4 hours before the test, or as told by the health care provider. These products include cigarettes, chewing tobacco, and vaping devices, such as e-cigarettes. If you need help quitting, ask your health care provider. If you use an inhaler for a breathing condition, bring it with you to the test. Do not apply lotions, powders, creams, or oils on your chest prior to the test. Wear loose-fitting clothes and comfortable walking shoes. What happens during the test?  Multiple electrodes will be attached to your chest. Multiple wires will be attached  to the electrodes. These will transfer the electrical impulses from your heart to the ECG machine. Your heart will be monitored both at rest and while exercising. If you are also having an echocardiogram or nuclear scanning, images of your heart will be taken before and after you exercise. A blood pressure cuff will be placed around your arm to measure your blood pressure throughout the test. You will feel it tighten and loosen throughout the test. An oxygen saturation monitor will be placed on your finger to check oxygen levels throughout the test. You will walk on a treadmill or use a stationary bike. If you cannot use these, you may be asked to turn a crank with your hands. You will start at a slow pace or level on the exercise machine. The exercise difficulty will be slowly increased to raise your heart rate. In the case of a treadmill, the speed and incline will gradually be increased. You may be asked to periodically breathe into a tube. This measures the gases you breathe out. You will be asked how you are feeling throughout the test. You will be asked to rate your level of exertion. Tell the health care team right away if you feel: Chest pain. Dizziness. Shortness of breath. Too fatigued to continue. Pain or aching in your legs or arms. You will exercise until you have symptoms or until you reach  a target heart rate. The test will also be stopped if you have abnormal changes in your blood pressure or ECG readings, or if you develop an irregular heartbeat (arrhythmia). The procedure may vary among health care providers and hospitals. What can I expect after the test? You will sit down and recover from the exercise. Your blood pressure, heart rate, and ECG will be monitored until you recover. You may return to your normal schedule, including diet, activities, and medicines, unless your health care provider tells you otherwise. It is up to you to get your test results. Ask your health care  provider, or the department that is doing the test, when your results will be ready. Summary An exercise stress test is a test that is done to collect information about how your heart functions during exercise. This test is done to look for coronary artery disease (CAD). During this test, you will walk on a treadmill or use an exercise bike to raise your heart rate. It is important to follow instructions from your health care provider about eating and drinking restrictions before the test. This may include avoiding caffeine and certain medicines before the test. This information is not intended to replace advice given to you by your health care provider. Make sure you discuss any questions you have with your health care provider. Document Revised: 06/06/2021 Document Reviewed: 06/06/2021 Elsevier Patient Education  2023 [employer].

## 2022-04-18 DIAGNOSIS — E118 Type 2 diabetes mellitus with unspecified complications: Secondary | ICD-10-CM | POA: Diagnosis not present

## 2022-04-19 ENCOUNTER — Other Ambulatory Visit: Payer: Self-pay | Admitting: Family Medicine

## 2022-04-19 DIAGNOSIS — M5410 Radiculopathy, site unspecified: Secondary | ICD-10-CM

## 2022-04-21 NOTE — Progress Notes (Unsigned)
[facility] [address], [address] [place], [place] [address] [phone]  Date:  04/25/2022   Name:  Sean Horn   DOB:  24-Jun-1961   MRN:  [id number]  PCP:  Pearline Cables, MD    Chief Complaint: Tremors (He says they used to be in his hands only, but are now in his face as well. /Concerns/ questions: he has also been having some fatigue. /)   History of Present Illness:  Sean Horn is a 61 y.o. very pleasant male patient who presents with the following:  Patient seen today with concern of tremors-history of TIA, SVT, end-stage arthritis of knees and hips, diabetes, hyperlipidemia, hypertension, CAD Most recent visit with myself was in April of this year He has been taking Tylenol 3 chronically for hip arthritis.  He had a right total hip in March 2023 and his left total hip is coming up- they had to delay due to his A1c as below ?  Is he still seeing endocrinology for diabetes-patient states he is not currently following up with Dr. Elvera Lennox Seen by his cardiologist Dr. Ladona Ridgel earlier this month: Assess/Plan:  Recurrent palpitations - he is improved on low dose flecainide. His QRS duration is acceptable. He has an apical LAD lesion but has no angina. I have planned to have him walk on the treadmill but his arthritis in the hip precludes this. After he is back able to exercise we will consider having him undergo exercise testing on flecainide. CAD - he denies anginal symptoms.  HTN - his bp is well controlled. We will follow. Dyslipidemia - he will continue lipitor 40 mg daily. Preop eval - he is a low surgical risk. Do not stop the beta blocker. Ok to hold plavix as needed.  His last A1c was done as part of his pre-op - it was quite elevated as below Previously in April his A1c was 6.9 Leonette Most notes he ran out of his Trulicity for a few weeks in the interim, this likely explains the increase Lab Results  Component  Value Date   HGBA1C 9.0 (H) 03/28/2022  Current diabetes regimen: Trulicity 4.5 Levemir 90 units daily Novolog 20- 25 typically with meals  Metformin 1000 twice daily  Pt notes he has felt really tired out, "wiped out and drained" for about 2 weeks His BP has been ok Glucose has been running about 130- 150, he did have a couple of low readings at night down to 50 or so No cough or fever He has a long history of hand tremor- however more recently others and himself have noted some shakiness of his head as well which concerned him  He is now using a CBG and this is really helping him keep track of his blood sugars  Patient Active Problem List   Diagnosis Date Noted   Unilateral primary osteoarthritis, left hip 01/30/2022   Syncope and collapse 01/22/2022   PSVT (paroxysmal supraventricular tachycardia) (HCC) 12/12/2021   NSVT (nonsustained ventricular tachycardia) (HCC) 12/12/2021   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   Unilateral primary osteoarthritis, right hip 06/26/2021   Hyperbilirubinemia 11/15/2020   PONV (postoperative nausea and vomiting)    Non-insulin dependent type 2 diabetes mellitus (HCC)    Chronic neck pain    Chronic headaches    CAD in native artery    Coronary artery disease involving native coronary artery of native heart without angina pectoris 11/06/2019  Diastolic dysfunction 11/06/2019   SVT (supraventricular tachycardia) (HCC) 06/26/2019   Non-ST elevation (NSTEMI) myocardial infarction (HCC) 06/26/2019   Tachycardia 06/25/2019   Hypertriglyceridemia 06/16/2019   Palpitations 06/12/2019   Morning headache 12/02/2018   Insomnia secondary to chronic pain 12/02/2018   Retrognathia 12/02/2018   TIA (transient ischemic attack) 11/03/2018   Cervical stenosis of spine 01/16/2018   Hyperlipidemia 07/19/2017   Obesity 01/30/2017   Urinary frequency 12/11/2016   Insomnia 09/13/2016   History of chicken pox    History of shingles     Preventative health care 08/14/2013   Anxiety    History of kidney stones    GERD (gastroesophageal reflux disease)    Type 2 diabetes mellitus with complication, with long-term current use of insulin (HCC)    Hypertension    Osteoarthritis    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 cath 06/26/2019 - 80% apical LAD >> medical management    Chronic headaches    Chronic neck pain    Coronary artery disease involving native coronary artery of native heart without angina pectoris 11/06/2019   GERD (gastroesophageal reflux disease)    occasional - OTC as needed   History of chicken pox    History of kidney stones    History of shingles    Hypertension    under control with med., had been on med. since age 49   Insomnia 09/13/2016   Insulin dependent diabetes mellitus    Follows with endocrinology, Dr Chestine Spore reports his last hgba1c was 7.3    Non-insulin dependent type 2 diabetes mellitus (HCC)    Non-ST elevation (NSTEMI) myocardial infarction (HCC) 06/26/2019   Obesity 01/30/2017   Occipital neuralgia    Osteoarthritis    right knee   PONV (postoperative nausea and vomiting)    PONV (postoperative nausea and vomiting)    Preventative health care 08/14/2013   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   And left now    SVT (supraventricular tachycardia) (HCC) 06/26/2019   Tachycardia    TIA (transient ischemic attack) 11/03/2018   Type 2 diabetes mellitus with complication, with long-term current use of insulin (HCC)    Follows with endocrinology, Dr Chestine Spore reports his last hgba1c was 7.3    Urinary frequency 12/11/2016    Past Surgical History:  Procedure Laterality Date   ANTERIOR CERVICAL DECOMP/DISCECTOMY FUSION N/A 01/16/2018   Procedure: ANTERIOR CERVICAL DECOMPRESSION/DISCECTOMY FUSION CERVICAL FOUR-FIVE, CERVICAL FIVE-SIX;  Surgeon: Lisbeth Renshaw, MD;  Location: [facility] OR;  Service: Neurosurgery;  Laterality:  N/A;  ANTERIOR CERVICAL DECOMPRESSION/DISCECTOMY FUSION CERVICAL FOUR-FIVE, CERVICAL FIVE-SIX   CHOLECYSTECTOMY  1980's   CYSTOSCOPY/RETROGRADE/URETEROSCOPY/STONE EXTRACTION WITH BASKET Right 08/27/2003   and double J stent placement   ELECTROPHYSIOLOGY STUDY N/A 07/23/2019   Procedure: ELECTROPHYSIOLOGY STUDY;  Surgeon: Marinus Maw, MD;  Location: [facility] INVASIVE CV [facility];  Service: Cardiovascular;  Laterality: N/A;   INGUINAL HERNIA REPAIR Left 1990's   KNEE ARTHROSCOPY Left 1980's   KNEE ARTHROSCOPY Right 2013   KNEE ARTHROSCOPY Right 06/30/2013   Procedure: RIGHT KNEE ARTHROSCOPY AND SYNOVECTOMY;  Surgeon: Velna Ochs, MD;  Location: [facility];  Service: Orthopedics;  Laterality: Right;   LEFT HEART CATH AND CORONARY ANGIOGRAPHY N/A 06/26/2019   Procedure: LEFT HEART CATH AND CORONARY ANGIOGRAPHY;  Surgeon: Marykay Lex, MD;  Location: [facility] INVASIVE CV LAB;  Service: Cardiovascular;  Laterality: N/A;   LITHOTRIPSY     multiple, b/l   SHOULDER ADHESION RELEASE Left ` 1998   SHOULDER ARTHROSCOPY W/ ROTATOR CUFF REPAIR Left 10/20/2004   x 4, had to have part of clavile removed. torn rotator cuff multiple times, had to place screws    SHOULDER ARTHROSCOPY W/ SUPERIOR LABRAL ANTERIOR POSTERIOR LESION REPAIR Left 12/22/2004   SVT ABLATION N/A 07/23/2019   Procedure: SVT ABLATION;  Surgeon: Marinus Mawaylor, Gregg W, MD;  Location: [facility] INVASIVE CV [facility];  Service: Cardiovascular;  Laterality: N/A;   TOTAL HIP ARTHROPLASTY Right 10/10/2021   Procedure: Right TOTAL HIP ARTHROPLASTY ANTERIOR APPROACH;  Surgeon: Kathryne HitchBlackman, Christopher Y, MD;  Location: [facility] OR;  Service: Orthopedics;  Laterality: Right;   TOTAL KNEE ARTHROPLASTY Right 12/23/2012   x3, 2 arthroscopies and a TKR,  TOTAL KNEE ARTHROPLASTY;  Surgeon: Velna OchsPeter G Dalldorf, MD;  Location: [facility] OR;  Service: Orthopedics;  Laterality: Right;  DEPUY, RNFA   WRIST SURGERY Right    x 3, torn ligaments, pins placed then infected, then fused with  plate    Social History   Tobacco Use   Smoking status: Never   Smokeless tobacco: Current    Types: Snuff  Vaping Use   Vaping Use: Never used  Substance Use Topics   Alcohol use: No   Drug use: No    Family History  Problem Relation Age of Onset   COPD Mother        smoker   Hypertension Mother    Heart disease Mother        CHF   Diabetes Father    Heart disease Father        CABG in 9050's   Stroke Father    Hypertension Father    Peripheral vascular disease Father    Hypertension Son        tachycardia   Heart disease Maternal Grandfather    Arthritis Paternal Grandmother    Colon cancer Neg Hx    Rectal cancer Neg Hx    Stomach cancer Neg Hx     Allergies  Allergen Reactions   Rosuvastatin Other (See Comments)    Joint pain   Toradol [Ketorolac Tromethamine] Itching    Medication list has been reviewed and updated.  Current Outpatient Medications on File Prior to Visit  Medication Sig Dispense Refill   acetaminophen (TYLENOL) 500 MG tablet Take 1,000-1,500 mg by mouth 2 (two) times daily as needed for moderate pain.     acetaminophen-codeine (TYLENOL #3) 300-30 MG tablet Take 1 tablet by mouth See admin instructions. Take 1 tablet at bedtime, may take a second tablet during the day as needed for pain 40 tablet 1   atorvastatin (LIPITOR) 40 MG tablet Take 1 tablet (40 mg total) by mouth daily. (Patient taking differently: Take 40 mg by mouth at bedtime.) 90 tablet 3   clopidogrel (PLAVIX) 75 MG tablet TAKE 1 TABLET BY MOUTH ONCE DAILY WITH BREAKFAST. 90 tablet 3   Dulaglutide (TRULICITY) 4.5 MG/0.5ML SOPN INJECT 1 SYRINGE SUBCUTANEOUSLY ONCE A WEEK 12 mL 0   escitalopram (LEXAPRO) 20 MG tablet Take 1 tablet by mouth once daily 30 tablet 0   flecainide (TAMBOCOR) 50 MG tablet Take 1 tablet (50 mg total) by mouth 2 (two) times daily. 180 tablet 3   gabapentin (NEURONTIN) 300 MG capsule TAKE 1 CAPSULE BY MOUTH THREE TIMES DAILY 90 capsule 5   insulin aspart  (NOVOLOG FLEXPEN) 100 UNIT/ML FlexPen Inject 25-35 units 3 times daily with  meals (Patient taking differently: Inject 25 Units into the skin 2 (two) times daily with a meal.) 30 mL 5   insulin detemir (LEVEMIR) 100 UNIT/ML injection INJECT 90 UNITS SUBCUTANEOUSLY AT BEDTIME 30 mL 0   isosorbide mononitrate (IMDUR) 30 MG 24 hr tablet Take 1 tablet (30 mg total) by mouth daily. Needs appointment for further refills 90 tablet 3   levothyroxine (SYNTHROID) 25 MCG tablet TAKE 1 TABLET BY MOUTH ONCE DAILY BEFORE BREAKFAST 90 tablet 0   lisinopril-hydrochlorothiazide (ZESTORETIC) 10-12.5 MG tablet Take 1 tablet by mouth daily. 90 tablet 3   metFORMIN (GLUCOPHAGE-XR) 500 MG 24 hr tablet TAKE 2 TABLETS BY MOUTH TWICE DAILY WITH MEALS 360 tablet 3   metoprolol succinate (TOPROL XL) 50 MG 24 hr tablet Take 50 mg in the morning with or immediately following a meal. Take 25 mg in the evening with or immediately following a meal. 135 tablet 3   nitroGLYCERIN (NITROSTAT) 0.4 MG SL tablet Place 1 tablet (0.4 mg total) under the tongue every 5 (five) minutes as needed. 25 tablet 12   omeprazole (PRILOSEC) 20 MG capsule Take 20 mg by mouth at bedtime.     rOPINIRole (REQUIP) 0.5 MG tablet Take 2 tablets (1 mg total) by mouth at bedtime. 180 tablet 1   No current facility-administered medications on file prior to visit.    Review of Systems:  As per HPI- otherwise negative.   Physical Examination: Vitals:   04/25/22 1018  BP: 120/78  Pulse: 90  Resp: 18  Temp: 97.8 F (36.6 C)  SpO2: 99%   Vitals:   04/25/22 1018  Weight: 225 lb 12.8 oz (102.4 kg)  Height: 6' (1.829 m)   Body mass index is 30.62 kg/m. Ideal Body Weight: Weight in (lb) to have BMI = 25: 183.9  GEN: no acute distress.  Minimal obesity, looks well HEENT: Atraumatic, Normocephalic. Bilateral TM wnl, oropharynx normal.  PEERL,EOMI.   Ears and Nose: No external deformity. CV: RRR, No M/G/R. No JVD. No thrill. No extra heart  sounds. PULM: CTA B, no wheezes, crackles, rhonchi. No retractions. No resp. distress. No accessory muscle use. ABD: S, NT, ND, +BS. No rebound. No HSM. EXTR: No c/c/e PSYCH: Normally interactive. Conversant.  No significant hand tremor is noted, normal finger-to-nose testing.  No facial tremors noted Status post right knee and right hip replacement Assessment and Plan: Need for influenza vaccination - Plan: Flu Vaccine QUAD 6+ mos PF IM (Fluarix Quad PF)  Fatigue, unspecified type - Plan: TSH, VITAMIN D 25 Hydroxy (Vit-D Deficiency, Fractures), Ferritin, Testosterone, Testosterone, free  Tremor - Plan: Ambulatory referral to Neurology  Type 2 diabetes mellitus with complication, with long-term current use of insulin ([place]) - Plan: Comprehensive metabolic panel  Screening for prostate cancer - Plan: PSA  Patient seen today for follow-up.  Given flu shot He notes fatigue, we will check his thyroid, other lab work as above.  He will come back for morning testosterone tomorrow Zamier has been out of his Trulicity for a few weeks, this is likely why his A1c went up.  He is now back on max dose Trulicity as well as both long-acting and short acting insulin.  Admits to a few low blood sugar readings; advised him to decrease Levemir by 10 to 80 units.  I also encouraged him to continue following up with endocrinology given his complex diabetes management Referral to neurology to discuss his tremor Update PSA today  Signed Lamar Blinks, MD

## 2022-04-25 ENCOUNTER — Encounter: Payer: Self-pay | Admitting: Family Medicine

## 2022-04-25 ENCOUNTER — Ambulatory Visit (INDEPENDENT_AMBULATORY_CARE_PROVIDER_SITE_OTHER): Payer: Medicare HMO | Admitting: Family Medicine

## 2022-04-25 VITALS — BP 120/78 | HR 90 | Temp 97.8°F | Resp 18 | Ht 72.0 in | Wt 225.8 lb

## 2022-04-25 DIAGNOSIS — Z125 Encounter for screening for malignant neoplasm of prostate: Secondary | ICD-10-CM | POA: Diagnosis not present

## 2022-04-25 DIAGNOSIS — E118 Type 2 diabetes mellitus with unspecified complications: Secondary | ICD-10-CM | POA: Diagnosis not present

## 2022-04-25 DIAGNOSIS — Z23 Encounter for immunization: Secondary | ICD-10-CM

## 2022-04-25 DIAGNOSIS — R251 Tremor, unspecified: Secondary | ICD-10-CM

## 2022-04-25 DIAGNOSIS — Z794 Long term (current) use of insulin: Secondary | ICD-10-CM | POA: Diagnosis not present

## 2022-04-25 DIAGNOSIS — R5383 Other fatigue: Secondary | ICD-10-CM

## 2022-04-25 NOTE — Patient Instructions (Addendum)
Good to see you today- I will be in touch with your labs asap Referral placed to [facility] neurology to discuss your tremor Let's drop the Levemir insulin to 80 units; if you continue to have any glucose levels under about 80 we will need to decrease further Lab orders are in- you should be able to go to [facility] at your convenience to have these drawn- early am needed for testosterone level!   Please let me know if any other concerns

## 2022-04-26 ENCOUNTER — Other Ambulatory Visit: Payer: Self-pay

## 2022-04-26 DIAGNOSIS — Z794 Long term (current) use of insulin: Secondary | ICD-10-CM

## 2022-04-26 DIAGNOSIS — Z125 Encounter for screening for malignant neoplasm of prostate: Secondary | ICD-10-CM | POA: Diagnosis not present

## 2022-04-26 DIAGNOSIS — R5383 Other fatigue: Secondary | ICD-10-CM | POA: Diagnosis not present

## 2022-04-26 DIAGNOSIS — E118 Type 2 diabetes mellitus with unspecified complications: Secondary | ICD-10-CM | POA: Diagnosis not present

## 2022-04-27 ENCOUNTER — Encounter: Payer: Self-pay | Admitting: Family Medicine

## 2022-04-27 LAB — TESTOSTERONE: Testosterone: 279 ng/dL (ref 264–916)

## 2022-04-27 LAB — COMPREHENSIVE METABOLIC PANEL
ALT: 21 IU/L (ref 0–44)
AST: 21 IU/L (ref 0–40)
Albumin/Globulin Ratio: 2 (ref 1.2–2.2)
Albumin: 4.5 g/dL (ref 3.8–4.9)
Alkaline Phosphatase: 68 IU/L (ref 44–121)
BUN/Creatinine Ratio: 14 (ref 10–24)
BUN: 14 mg/dL (ref 8–27)
Bilirubin Total: 1.6 mg/dL — ABNORMAL HIGH (ref 0.0–1.2)
CO2: 24 mmol/L (ref 20–29)
Calcium: 9.7 mg/dL (ref 8.6–10.2)
Chloride: 99 mmol/L (ref 96–106)
Creatinine, Ser: 1.03 mg/dL (ref 0.76–1.27)
Globulin, Total: 2.2 g/dL (ref 1.5–4.5)
Glucose: 104 mg/dL — ABNORMAL HIGH (ref 70–99)
Potassium: 4.9 mmol/L (ref 3.5–5.2)
Sodium: 138 mmol/L (ref 134–144)
Total Protein: 6.7 g/dL (ref 6.0–8.5)
eGFR: 83 mL/min/{1.73_m2} (ref >59)

## 2022-04-27 LAB — PSA: Prostate Specific Ag, Serum: 0.9 ng/mL (ref 0.0–4.0)

## 2022-04-27 LAB — FERRITIN: Ferritin: 144 ng/mL (ref 30–400)

## 2022-04-27 LAB — TSH: TSH: 1.43 u[IU]/mL (ref 0.450–4.500)

## 2022-04-27 LAB — VITAMIN D 25 HYDROXY (VIT D DEFICIENCY, FRACTURES): Vit D, 25-Hydroxy: 27 ng/mL — ABNORMAL LOW (ref 30.0–100.0)

## 2022-04-29 ENCOUNTER — Other Ambulatory Visit: Payer: Self-pay | Admitting: Family Medicine

## 2022-05-01 ENCOUNTER — Telehealth: Payer: Self-pay | Admitting: *Deleted

## 2022-05-01 ENCOUNTER — Encounter: Payer: Self-pay | Admitting: *Deleted

## 2022-05-01 ENCOUNTER — Encounter: Payer: Self-pay | Admitting: Family Medicine

## 2022-05-01 DIAGNOSIS — R5383 Other fatigue: Secondary | ICD-10-CM

## 2022-05-01 NOTE — Patient Outreach (Addendum)
Duplicate entry from 6/96/78- please disregard  Oneta Rack, RN, BSN, CCRN Alumnus RN CM Care Coordination/ Transition of [place] [facility] [phone]: direct office

## 2022-05-01 NOTE — Patient Outreach (Signed)
  Care Coordination   05/01/2022 Name: Sean Horn MRN: [id number] DOB: [birth year]   Care Coordination Outreach Attempts:  An unsuccessful telephone outreach was attempted today to offer the patient information about available care coordination services as a benefit of their health plan.   Follow Up Plan:  Additional outreach attempts will be made to offer the patient care coordination information and services.   Encounter Outcome:  No Answer left voice message requesting call back  Care Coordination Interventions Activated:  No   Care Coordination Interventions:  No, not indicated unsuccessful attempt # 1   Oneta Rack, RN, BSN, CCRN Alumnus RN CM Care Coordination/ Transition of [place] [facility] [phone]: direct office

## 2022-05-02 NOTE — Addendum Note (Signed)
Addended byDamita Dunnings D on: 05/02/2022 04:20 PM   Modules accepted: Orders

## 2022-05-04 ENCOUNTER — Telehealth: Payer: Self-pay | Admitting: *Deleted

## 2022-05-04 ENCOUNTER — Encounter: Payer: Self-pay | Admitting: *Deleted

## 2022-05-04 LAB — TESTOSTERONE, FREE: Testosterone, Free: 6.7 pg/mL (ref 6.6–18.1)

## 2022-05-04 NOTE — Patient Outreach (Signed)
  Care Coordination   05/04/2022 Name: Sean Horn MRN: [id number] DOB: 07-26-1961   Care Coordination Outreach Attempts:  A second unsuccessful outreach was attempted today to offer the patient with information about available care coordination services as a benefit of their health plan.     Follow Up Plan:  Additional outreach attempts will be made to offer the patient care coordination information and services.   Encounter Outcome:  No Answer left voice message requesting call back  Care Coordination Interventions Activated:  No   Care Coordination Interventions:  No, not indicated unsuccessful outreach attempt # 2    Oneta Rack, RN, BSN, CCRN Alumnus RN CM Care Coordination/ Transition of [facility] [phone]: direct office

## 2022-05-08 ENCOUNTER — Encounter: Payer: Self-pay | Admitting: *Deleted

## 2022-05-08 ENCOUNTER — Telehealth: Payer: Self-pay | Admitting: *Deleted

## 2022-05-08 NOTE — Patient Outreach (Signed)
  Care Coordination   05/08/2022 Name: Sean Horn MRN: [id number] DOB: [birth year]   Care Coordination Outreach Attempts:  A third unsuccessful outreach was attempted today to offer the patient with information about available care coordination services as a benefit of their health plan.   Follow Up Plan:  No further outreach attempts will be made at this time. We have been unable to contact the patient to offer or enroll patient in care coordination services  Encounter Outcome:  No Answer left voice message requesting call back  Care Coordination Interventions Activated:  No   Care Coordination Interventions:  No, not indicated unsuccessful consecutive outreach attempt # 3 without patient call back    Oneta Rack, RN, BSN, CCRN Alumnus RN CM Care Coordination/ Transition of [facility] [phone]: direct office

## 2022-05-18 DIAGNOSIS — E118 Type 2 diabetes mellitus with unspecified complications: Secondary | ICD-10-CM | POA: Diagnosis not present

## 2022-05-20 ENCOUNTER — Other Ambulatory Visit: Payer: Self-pay | Admitting: Family Medicine

## 2022-06-11 ENCOUNTER — Other Ambulatory Visit: Payer: Self-pay | Admitting: Family Medicine

## 2022-06-11 ENCOUNTER — Other Ambulatory Visit: Payer: Self-pay | Admitting: Cardiology

## 2022-06-11 DIAGNOSIS — F419 Anxiety disorder, unspecified: Secondary | ICD-10-CM

## 2022-06-11 NOTE — Telephone Encounter (Signed)
Rx refill sent to pharmacy. 

## 2022-06-14 ENCOUNTER — Encounter: Payer: Self-pay | Admitting: Family Medicine

## 2022-06-14 ENCOUNTER — Ambulatory Visit: Payer: Medicare HMO | Attending: Cardiology | Admitting: Cardiology

## 2022-06-14 ENCOUNTER — Encounter: Payer: Self-pay | Admitting: Cardiology

## 2022-06-14 ENCOUNTER — Other Ambulatory Visit: Payer: Self-pay

## 2022-06-14 VITALS — BP 138/82 | HR 87 | Ht 72.0 in | Wt 223.6 lb

## 2022-06-14 DIAGNOSIS — I471 Supraventricular tachycardia, unspecified: Secondary | ICD-10-CM | POA: Diagnosis not present

## 2022-06-14 DIAGNOSIS — I2510 Atherosclerotic heart disease of native coronary artery without angina pectoris: Secondary | ICD-10-CM

## 2022-06-14 DIAGNOSIS — I10 Essential (primary) hypertension: Secondary | ICD-10-CM | POA: Diagnosis not present

## 2022-06-14 DIAGNOSIS — Z79899 Other long term (current) drug therapy: Secondary | ICD-10-CM

## 2022-06-14 DIAGNOSIS — E782 Mixed hyperlipidemia: Secondary | ICD-10-CM | POA: Diagnosis not present

## 2022-06-14 DIAGNOSIS — R5383 Other fatigue: Secondary | ICD-10-CM | POA: Diagnosis not present

## 2022-06-14 NOTE — Patient Instructions (Signed)
Medication Instructions:  Your physician recommends that you continue on your current medications as directed. Please refer to the Current Medication list given to you today.  *If you need a refill on your cardiac medications before your next appointment, please call your pharmacy*   Lab Work: Flecainide level- today   If you have labs (blood work) drawn today and your tests are completely normal, you will receive your results only by: MyChart Message (if you have MyChart) OR A paper copy in the mail If you have any lab test that is abnormal or we need to change your treatment, we will call you to review the results.   Testing/Procedures: None ordered    Follow-Up: At [facility], you and your health needs are our priority.  As part of our continuing mission to provide you with exceptional heart care, we have created designated Provider Care Teams.  These Care Teams include your primary Cardiologist (physician) and Advanced Practice Providers (APPs -  Physician Assistants and Nurse Practitioners) who all work together to provide you with the care you need, when you need it.  We recommend signing up for the patient portal called "MyChart".  Sign up information is provided on this After Visit Summary.  MyChart is used to connect with patients for Virtual Visits (Telemedicine).  Patients are able to view lab/test results, encounter notes, upcoming appointments, etc.  Non-urgent messages can be sent to your provider as well.   To learn more about what you can do with MyChart, go to [email or web address].    Your next appointment:   6 month(s)  The format for your next appointment:   In Person  Provider:   Thomasene Ripple, DO     Other Instructions   Important Information About Sugar

## 2022-06-14 NOTE — Progress Notes (Signed)
Cardiology Office Note:    Date:  06/18/2022   ID:  Sean Horn, DOB 1961/04/16, MRN [id number]  PCP:  Pearline Cables, MD  Cardiologist:  Thomasene Ripple, DO  Electrophysiologist:  Lewayne Bunting, MD   Referring MD: Pearline Cables, MD   " I am ok"   History of Present Illness:    Sean Horn is a 61 y.o. male with a hx of hypertension, NSTEMI/coronary artery disease status post left heart cath June 26, 2019 with 80% apical LAD lesions which has been stated for medical management due to this lesion being a unsuitable candidate for PCI, NSVT seen on monitor, SVT/AVNRT status post ablation on July 23, 2019.   I did see the patient on March 23, 2020 at that time he reported that he had a syncope episode.  Given this we placed a monitor and the patient.  He did wear the monitor the first 1 for 1 day and 3 hours which showed nonsustained ventricular tachycardia, because this fell out we will send the patient another monitor which he wore for longer duration and it was unremarkable.   I last saw the patient October 2021 at that time he has been shortness of breath I repeated an echocardiogram.  I also increased his beta-blocker given his notable episodes of nonsustained ventricular tachycardia on his ZIO monitor.   I saw the patient on June 19, 2021 at that time he was planning to go nuclear procedure and he was cleared.  His last visit for me was Dec 11, 2021 at that time we increased his beta-blocker to 50 mg in the morning and 25 at night.  I sent him back to our [facility] for patient for consideration of arrhythmics.  He was started on low-dose flecainide and he seems to be doing well with this.  Today he offers no complaints.  Past Medical History:  Diagnosis Date   Anxiety    CAD in native artery    a. cath 06/26/2019 - 80% apical LAD >> medical management    Chronic headaches    Chronic neck pain    Coronary artery disease involving native coronary artery  of native heart without angina pectoris 11/06/2019   GERD (gastroesophageal reflux disease)    occasional - OTC as needed   History of chicken pox    History of kidney stones    History of shingles    Hypertension    under control with med., had been on med. 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   And left now    SVT (supraventricular tachycardia) 06/26/2019   Tachycardia    TIA (transient ischemic attack) 11/03/2018   Type 2 diabetes mellitus with complication, with long-term current use of insulin (HCC)    Follows with endocrinology, Dr Chestine Spore reports his last hgba1c was 7.3    Urinary frequency 12/11/2016    Past Surgical History:  Procedure Laterality Date   ANTERIOR CERVICAL DECOMP/DISCECTOMY FUSION N/A 01/16/2018   Procedure: ANTERIOR  CERVICAL DECOMPRESSION/DISCECTOMY FUSION CERVICAL FOUR-FIVE, CERVICAL FIVE-SIX;  Surgeon: Lisbeth RenshawNundkumar, Neelesh, MD;  Location: [facility] OR;  Service: Neurosurgery;  Laterality: N/A;  ANTERIOR CERVICAL DECOMPRESSION/DISCECTOMY FUSION CERVICAL FOUR-FIVE, CERVICAL FIVE-SIX   CHOLECYSTECTOMY  1980's   CYSTOSCOPY/RETROGRADE/URETEROSCOPY/STONE EXTRACTION WITH BASKET Right 08/27/2003   and double J stent placement   ELECTROPHYSIOLOGY STUDY N/A 07/23/2019   Procedure: ELECTROPHYSIOLOGY STUDY;  Surgeon: Marinus Mawaylor, Gregg W, MD;  Location: [facility] INVASIVE CV LAB;  Service: Cardiovascular;  Laterality: N/A;   INGUINAL HERNIA REPAIR Left 1990's   Horn ARTHROSCOPY Left 1980's   Horn ARTHROSCOPY Right 2013   Horn ARTHROSCOPY Right  06/30/2013   Procedure: RIGHT Horn ARTHROSCOPY AND SYNOVECTOMY;  Surgeon: Velna OchsPeter G Dalldorf, MD;  Location: [facility];  Service: Orthopedics;  Laterality: Right;   LEFT HEART CATH AND CORONARY ANGIOGRAPHY N/A 06/26/2019   Procedure: LEFT HEART CATH AND CORONARY ANGIOGRAPHY;  Surgeon: Marykay LexHarding, David W, MD;  [facility];  Service: Cardiovascular;  Laterality: N/A;   LITHOTRIPSY     multiple, b/l   SHOULDER ADHESION RELEASE Left ` 1998   SHOULDER ARTHROSCOPY W/ ROTATOR CUFF REPAIR Left 10/20/2004   x 4, had to have part of clavile removed. torn rotator cuff multiple times, had to place screws    SHOULDER ARTHROSCOPY W/ SUPERIOR LABRAL ANTERIOR POSTERIOR LESION REPAIR Left 12/22/2004   SVT ABLATION N/A 07/23/2019   Procedure: SVT ABLATION;  Surgeon: Marinus Mawaylor, Gregg W, MD;  Location: [facility];  Service: Cardiovascular;  Laterality: N/A;   TOTAL HIP ARTHROPLASTY Right 10/10/2021   Procedure: Right TOTAL HIP ARTHROPLASTY ANTERIOR APPROACH;  Surgeon: Kathryne HitchBlackman, Christopher Y, MD;  Location: [facility] OR;  Service: Orthopedics;  Laterality: Right;   TOTAL Horn ARTHROPLASTY Right 12/23/2012   x3, 2 arthroscopies and a TKR,  TOTAL Horn ARTHROPLASTY;  Surgeon: Velna OchsPeter G Dalldorf, MD;  Location: [facility] OR;  Service: Orthopedics;  Laterality: Right;  DEPUY, RNFA   WRIST SURGERY Right    x 3, torn ligaments, pins placed then infected, then fused with plate    Current Medications: Current Meds  Medication Sig   acetaminophen (TYLENOL) 500 MG tablet Take 1,000-1,500 mg by mouth 2 (two) times daily as needed for moderate pain.   acetaminophen-codeine (TYLENOL #3) 300-30 MG tablet Take 1 tablet by mouth See admin instructions. Take 1 tablet at bedtime, may take a second tablet during the day as needed for pain   atorvastatin (LIPITOR) 40 MG tablet Take 1 tablet (40 mg total) by mouth daily. (Patient taking differently: Take 40 mg by mouth at bedtime.)   clopidogrel (PLAVIX) 75 MG tablet  TAKE 1 TABLET BY MOUTH ONCE DAILY WITH BREAKFAST.   Dulaglutide (TRULICITY) 4.5 MG/0.5ML SOPN INJECT 1 SYRINGE SUBCUTANEOUSLY ONCE A WEEK   escitalopram (LEXAPRO) 20 MG tablet Take 1 tablet (20 mg total) by mouth daily.   flecainide (TAMBOCOR) 50 MG tablet Take 1 tablet (50 mg total) by mouth 2 (two) times daily.   gabapentin (NEURONTIN) 300 MG capsule TAKE 1 CAPSULE BY MOUTH THREE TIMES DAILY   insulin aspart (NOVOLOG FLEXPEN) 100 UNIT/ML FlexPen Inject 25-35 units 3 times daily with meals (Patient taking differently: Inject 25 Units into the skin 2 (two) times daily with a meal.)   insulin detemir (LEVEMIR) 100 UNIT/ML injection INJECT 90 UNITS SUBCUTANEOUSLY AT BEDTIME   isosorbide mononitrate (IMDUR) 30 MG 24 hr tablet Take 1 tablet (30 mg total) by mouth daily. Needs appointment for further refills   levothyroxine (SYNTHROID) 25 MCG tablet TAKE 1  TABLET BY MOUTH ONCE DAILY BEFORE BREAKFAST   lisinopril-hydrochlorothiazide (ZESTORETIC) 10-12.5 MG tablet Take 1 tablet by mouth daily.   metFORMIN (GLUCOPHAGE-XR) 500 MG 24 hr tablet TAKE 2 TABLETS BY MOUTH TWICE DAILY WITH MEALS   metoprolol succinate (TOPROL-XL) 50 MG 24 hr tablet Take 1 tablet (50 mg total) by mouth every morning. And take 25 mg in the evening TAKE WITH OR IMMEDIATELY FOLLOWING A MEAL   nitroGLYCERIN (NITROSTAT) 0.4 MG SL tablet Place 1 tablet (0.4 mg total) under the tongue every 5 (five) minutes as needed.   omeprazole (PRILOSEC) 20 MG capsule Take 20 mg by mouth at bedtime.   rOPINIRole (REQUIP) 0.5 MG tablet Take 2 tablets (1 mg total) by mouth at bedtime.     Allergies:   Rosuvastatin and Toradol [ketorolac tromethamine]   Social History   Socioeconomic History   Marital status: Single    Spouse name: Not on file   Number of children: 2   Years of education: Not on file   Highest education level: High school graduate  Occupational History   Occupation: Disabled  Tobacco Use   Smoking status: Never   Smokeless  tobacco: Current    Types: Snuff  Vaping Use   Vaping Use: Never used  Substance and Sexual Activity   Alcohol use: No   Drug use: No   Sexual activity: Yes  Other Topics Concern   Not on file  Social History Narrative   Lives at home with his father (he takes care of his father)   Right handed   Drinks 2 cups of caffeine daily   Social Determinants of Health   Financial Resource Strain: Not on file  Food Insecurity: Not on file  Transportation Needs: Not on file  Physical Activity: Not on file  Stress: Not on file  Social Connections: Not on file     Family History: The patient's family history includes Arthritis in his paternal grandmother; COPD in his mother; Diabetes in his father; Heart disease in his father, maternal grandfather, and mother; Hypertension in his father, mother, and son; Peripheral vascular disease in his father; Stroke in his father. There is no history of Colon cancer, Rectal cancer, or Stomach cancer.  ROS:   Review of Systems  Constitution: Negative for decreased appetite, fever and weight gain.  HENT: Negative for congestion, ear discharge, hoarse voice and sore throat.   Eyes: Negative for discharge, redness, vision loss in right eye and visual halos.  Cardiovascular: Negative for chest pain, dyspnea on exertion, leg swelling, orthopnea and palpitations.  Respiratory: Negative for cough, hemoptysis, shortness of breath and snoring.   Endocrine: Negative for heat intolerance and polyphagia.  Hematologic/Lymphatic: Negative for bleeding problem. Does not bruise/bleed easily.  Skin: Negative for flushing, nail changes, rash and suspicious lesions.  Musculoskeletal: Negative for arthritis, joint pain, muscle cramps, myalgias, neck pain and stiffness.  Gastrointestinal: Negative for abdominal pain, bowel incontinence, diarrhea and excessive appetite.  Genitourinary: Negative for decreased libido, genital sores and incomplete emptying.  Neurological:  Negative for brief paralysis, focal weakness, headaches and loss of balance.  Psychiatric/Behavioral: Negative for altered mental status, depression and suicidal ideas.  Allergic/Immunologic: Negative for HIV exposure and persistent infections.    EKGs/Labs/Other Studies Reviewed:    The following studies were reviewed today:   EKG:  The ekg ordered today demonstrates   Recent Labs: 03/28/2022: Hemoglobin 13.9; Platelets 287 04/26/2022: ALT 21; BUN 14; Creatinine, Ser 1.03; Potassium 4.9; Sodium 138; TSH 1.430  Recent Lipid  Panel    Component Value Date/Time   CHOL 191 06/05/2021 1352   TRIG (H) 06/05/2021 1352    1241.0 Triglyceride is over 400; calculations on Lipids are invalid.   HDL 33.00 (L) 06/05/2021 1352   CHOLHDL 6 06/05/2021 1352   VLDL 51 (H) 06/26/2019 0409   LDLCALC 68 04/21/2020 1448   LDLDIRECT 55.0 06/05/2021 1352    Physical Exam:    VS:  BP 138/82   Pulse 87   Ht 6' (1.829 m)   Wt 223 lb 9.6 oz (101.4 kg)   SpO2 98%   BMI 30.33 kg/m     Wt Readings from Last 3 Encounters:  06/14/22 223 lb 9.6 oz (101.4 kg)  04/25/22 225 lb 12.8 oz (102.4 kg)  04/17/22 225 lb (102.1 kg)     GEN: Well nourished, well developed in no acute distress HEENT: Normal NECK: No JVD; No carotid bruits LYMPHATICS: No lymphadenopathy CARDIAC: S1S2 noted,RRR, no murmurs, rubs, gallops RESPIRATORY:  Clear to auscultation without rales, wheezing or rhonchi  ABDOMEN: Soft, non-tender, non-distended, +bowel sounds, no guarding. EXTREMITIES: No edema, No cyanosis, no clubbing MUSCULOSKELETAL:  No deformity  SKIN: Warm and dry NEUROLOGIC:  Alert and oriented x 3, non-focal PSYCHIATRIC:  Normal affect, good insight  ASSESSMENT:    1. Medication management   2. PSVT (paroxysmal supraventricular tachycardia)   3. Coronary artery disease involving native coronary artery of native heart without angina pectoris   4. Essential hypertension   5. Mixed hyperlipidemia    PLAN:      PSVT-this seems to be under control with his current antiarrhythmics and beta-blocker.  No changes here. CAD-no angina. Hypertension blood pressure is controlled no need to change his medication Hyperlipidemia - continue with current statin medication.  The patient is in agreement with the above plan. The patient left the office in stable condition.  The patient will follow up in   Medication Adjustments/Labs and Tests Ordered: Current medicines are reviewed at length with the patient today.  Concerns regarding medicines are outlined above.  Orders Placed This Encounter  Procedures   Flecainide level   No orders of the defined types were placed in this encounter.   Patient Instructions  Medication Instructions:  Your physician recommends that you continue on your current medications as directed. Please refer to the Current Medication list given to you today.  *If you need a refill on your cardiac medications before your next appointment, please call your pharmacy*   Lab Work: Flecainide level- today   If you have labs (blood work) drawn today and your tests are completely normal, you will receive your results only by: MyChart Message (if you have MyChart) OR A paper copy in the mail If you have any lab test that is abnormal or we need to change your treatment, we will call you to review the results.   Testing/Procedures: None ordered    Follow-Up: At [facility], you and your health needs are our priority.  As part of our continuing mission to provide you with exceptional heart care, we have created designated Provider Care Teams.  These Care Teams include your primary Cardiologist (physician) and Advanced Practice Providers (APPs -  Physician Assistants and Nurse Practitioners) who all work together to provide you with the care you need, when you need it.  We recommend signing up for the patient portal called "MyChart".  Sign up information is provided on this  After Visit Summary.  MyChart is used to connect with  patients for Virtual Visits (Telemedicine).  Patients are able to view lab/test results, encounter notes, upcoming appointments, etc.  Non-urgent messages can be sent to your provider as well.   To learn more about what you can do with MyChart, go to [email or web address].    Your next appointment:   6 month(s)  The format for your next appointment:   In Person  Provider:   Thomasene Ripple, DO     Other Instructions   Important Information About Sugar         Adopting a Healthy Lifestyle.  Know what a healthy weight is for you (roughly BMI <25) and aim to maintain this   Aim for 7+ servings of fruits and vegetables daily   65-80+ fluid ounces of water or unsweet tea for healthy kidneys   Limit to max 1 drink of alcohol per day; avoid smoking/tobacco   Limit animal fats in diet for cholesterol and heart health - choose grass fed whenever available   Avoid highly processed foods, and foods high in saturated/trans fats   Aim for low stress - take time to unwind and care for your mental health   Aim for 150 min of moderate intensity exercise weekly for heart health, and weights twice weekly for bone health   Aim for 7-9 hours of sleep daily   When it comes to diets, agreement about the perfect plan isnt easy to find, even among the experts. Experts at the [facility] [employer] developed an idea known as the Healthy Eating Plate. Just imagine a plate divided into logical, healthy portions.   The emphasis is on diet quality:   Load up on vegetables and fruits - one-half of your plate: Aim for color and variety, and remember that potatoes dont count.   Go for whole grains - one-quarter of your plate: Whole wheat, barley, wheat berries, quinoa, oats, brown rice, and foods made with them. If you want pasta, go with whole wheat pasta.   Protein power - one-quarter of your plate: Fish, chicken, beans, and nuts are  all healthy, versatile protein sources. Limit red meat.   The diet, however, does go beyond the plate, offering a few other suggestions.   Use healthy plant oils, such as olive, canola, soy, corn, sunflower and peanut. Check the labels, and avoid partially hydrogenated oil, which have unhealthy trans fats.   If youre thirsty, drink water. Coffee and tea are good in moderation, but skip sugary drinks and limit milk and dairy products to one or two daily servings.   The type of carbohydrate in the diet is more important than the amount. Some sources of carbohydrates, such as vegetables, fruits, whole grains, and beans-are healthier than others.   Finally, stay active  Signed, Thomasene Ripple, DO  06/18/2022 9:57 AM    [facility]

## 2022-06-18 DIAGNOSIS — E118 Type 2 diabetes mellitus with unspecified complications: Secondary | ICD-10-CM | POA: Diagnosis not present

## 2022-06-19 LAB — FLECAINIDE LEVEL: Flecainide: 0.16 ug/ml — ABNORMAL LOW (ref 0.20–1.00)

## 2022-06-22 ENCOUNTER — Other Ambulatory Visit: Payer: Self-pay | Admitting: Family Medicine

## 2022-06-22 ENCOUNTER — Encounter: Payer: Self-pay | Admitting: Family Medicine

## 2022-06-22 DIAGNOSIS — R5383 Other fatigue: Secondary | ICD-10-CM

## 2022-06-22 DIAGNOSIS — M5410 Radiculopathy, site unspecified: Secondary | ICD-10-CM

## 2022-06-22 LAB — TESTOSTERONE, FREE AND TOTAL (INCLUDES SHBG)-(MALES)
% Free Testosterone: 1.7 %
Free Testosterone, S: 44 pg/mL — ABNORMAL LOW
Sex Hormone Binding Globulin: 34.7 nmol/L
Testosterone, Serum (Total): 261 ng/dL — ABNORMAL LOW

## 2022-06-26 ENCOUNTER — Other Ambulatory Visit: Payer: Self-pay | Admitting: Family Medicine

## 2022-07-12 ENCOUNTER — Encounter: Payer: Self-pay | Admitting: Family Medicine

## 2022-07-12 DIAGNOSIS — E118 Type 2 diabetes mellitus with unspecified complications: Secondary | ICD-10-CM

## 2022-07-12 NOTE — Telephone Encounter (Signed)
This is: Marquis Lunch, MD Address: [address], [place], [place] [address] Phone: [phone]  Okay for referral?

## 2022-07-14 ENCOUNTER — Other Ambulatory Visit: Payer: Self-pay | Admitting: Family Medicine

## 2022-07-18 DIAGNOSIS — E118 Type 2 diabetes mellitus with unspecified complications: Secondary | ICD-10-CM | POA: Diagnosis not present

## 2022-07-20 ENCOUNTER — Other Ambulatory Visit: Payer: Self-pay | Admitting: Family Medicine

## 2022-07-20 ENCOUNTER — Other Ambulatory Visit: Payer: Self-pay | Admitting: Cardiology

## 2022-07-20 DIAGNOSIS — M5410 Radiculopathy, site unspecified: Secondary | ICD-10-CM

## 2022-07-26 ENCOUNTER — Other Ambulatory Visit: Payer: Self-pay | Admitting: Family Medicine

## 2022-08-17 DIAGNOSIS — E118 Type 2 diabetes mellitus with unspecified complications: Secondary | ICD-10-CM | POA: Diagnosis not present

## 2022-08-30 NOTE — Addendum Note (Signed)
Addended by: Oleta Mouse C on: 08/30/2022 05:09 PM   Modules accepted: Orders

## 2022-09-05 ENCOUNTER — Encounter: Payer: Self-pay | Admitting: *Deleted

## 2022-09-05 ENCOUNTER — Encounter: Payer: Self-pay | Admitting: "Endocrinology

## 2022-09-05 ENCOUNTER — Other Ambulatory Visit: Payer: Self-pay | Admitting: Family Medicine

## 2022-09-05 ENCOUNTER — Ambulatory Visit: Payer: Medicare HMO | Admitting: "Endocrinology

## 2022-09-05 VITALS — BP 98/76 | HR 84 | Ht 72.0 in | Wt 222.0 lb

## 2022-09-05 DIAGNOSIS — E039 Hypothyroidism, unspecified: Secondary | ICD-10-CM

## 2022-09-05 DIAGNOSIS — E119 Type 2 diabetes mellitus without complications: Secondary | ICD-10-CM

## 2022-09-05 DIAGNOSIS — E782 Mixed hyperlipidemia: Secondary | ICD-10-CM

## 2022-09-05 DIAGNOSIS — I10 Essential (primary) hypertension: Secondary | ICD-10-CM | POA: Diagnosis not present

## 2022-09-05 DIAGNOSIS — F419 Anxiety disorder, unspecified: Secondary | ICD-10-CM

## 2022-09-05 DIAGNOSIS — Z794 Long term (current) use of insulin: Secondary | ICD-10-CM

## 2022-09-05 LAB — POCT GLYCOSYLATED HEMOGLOBIN (HGB A1C): HbA1c, POC (controlled diabetic range): 8 % — AB (ref 0.0–7.0)

## 2022-09-05 MED ORDER — METFORMIN HCL ER (MOD) 1000 MG PO TB24: 1000.0000 mg | ORAL_TABLET | Freq: Every day | ORAL | 1 refills | Status: DC

## 2022-09-05 MED ORDER — NOVOLOG FLEXPEN 100 UNIT/ML ~~LOC~~ SOPN: 1.0000 [IU] | PEN_INJECTOR | Freq: Three times a day (TID) | SUBCUTANEOUS | 5 refills | Status: DC

## 2022-09-05 MED ORDER — OZEMPIC (0.25 OR 0.5 MG/DOSE) 2 MG/3ML ~~LOC~~ SOPN: 0.5000 mg | PEN_INJECTOR | SUBCUTANEOUS | 2 refills | Status: DC

## 2022-09-05 MED ORDER — INSULIN DETEMIR 100 UNIT/ML ~~LOC~~ SOLN: 80.0000 [IU] | Freq: Every day | SUBCUTANEOUS | 0 refills | Status: DC

## 2022-09-05 NOTE — Progress Notes (Signed)
Endocrinology Consult Note       09/05/2022, 12:38 PM   Subjective:    Patient ID: Sean Horn, male    DOB: 1960-09-14.  Sean Horn is being seen in consultation for management of currently uncontrolled symptomatic diabetes requested by  Copland, Gwenlyn Found, MD.   Past Medical History:  Diagnosis Date   Anxiety    CAD in native artery    a. cath 06/26/2019 - 80% apical LAD >> medical management    Chronic headaches    Chronic neck pain    Coronary artery disease involving native coronary artery of native heart without angina pectoris 11/06/2019   GERD (gastroesophageal reflux disease)    occasional - OTC as needed   History of chicken pox    History of kidney stones    History of shingles    Hypertension    under control with med., had been on med. 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   And left now    SVT (supraventricular tachycardia) 06/26/2019   Tachycardia    TIA (transient ischemic attack) 11/03/2018   Type 2 diabetes mellitus with complication, with long-term current use of insulin (HCC)    Follows with endocrinology, Dr Chestine Spore reports his last hgba1c was 7.3    Urinary frequency 12/11/2016    Past Surgical History:  Procedure Laterality Date   ANTERIOR CERVICAL DECOMP/DISCECTOMY FUSION N/A 01/16/2018   Procedure: ANTERIOR CERVICAL DECOMPRESSION/DISCECTOMY FUSION CERVICAL FOUR-FIVE, CERVICAL FIVE-SIX;  Surgeon: Lisbeth Renshaw,  MD;  Location: [facility] OR;  Service: Neurosurgery;  Laterality: N/A;  ANTERIOR CERVICAL DECOMPRESSION/DISCECTOMY FUSION CERVICAL FOUR-FIVE, CERVICAL FIVE-SIX   CHOLECYSTECTOMY  1980's   CYSTOSCOPY/RETROGRADE/URETEROSCOPY/STONE EXTRACTION WITH BASKET Right 08/27/2003   and double J stent placement   ELECTROPHYSIOLOGY STUDY N/A 07/23/2019   Procedure: ELECTROPHYSIOLOGY STUDY;  Surgeon: Marinus Maw, MD;  Location: [facility];  Service: Cardiovascular;  Laterality: N/A;   INGUINAL HERNIA REPAIR Left 1990's   KNEE ARTHROSCOPY Left 1980's   KNEE ARTHROSCOPY Right 2013   KNEE ARTHROSCOPY Right 06/30/2013   Procedure: RIGHT KNEE ARTHROSCOPY AND SYNOVECTOMY;  Surgeon: Velna Ochs, MD;  Location: [facility];  Service: Orthopedics;  Laterality: Right;   LEFT HEART CATH AND CORONARY ANGIOGRAPHY N/A 06/26/2019   Procedure: LEFT HEART CATH AND CORONARY ANGIOGRAPHY;  Surgeon: Marykay Lex, MD;  Location: [facility] INVASIVE CV LAB;  Service: Cardiovascular;  Laterality: N/A;   LITHOTRIPSY     multiple, b/l   SHOULDER ADHESION RELEASE Left ` 1998   SHOULDER ARTHROSCOPY W/ ROTATOR CUFF REPAIR Left 10/20/2004   x 4, had to have part of clavile removed.  torn rotator cuff multiple times, had to place screws    SHOULDER ARTHROSCOPY W/ SUPERIOR LABRAL ANTERIOR POSTERIOR LESION REPAIR Left 12/22/2004   SVT ABLATION N/A 07/23/2019   Procedure: SVT ABLATION;  Surgeon: Evans Lance, MD;  Location: [place] [facility];  Service: Cardiovascular;  Laterality: N/A;   TOTAL HIP ARTHROPLASTY Right 10/10/2021   Procedure: Right TOTAL HIP ARTHROPLASTY ANTERIOR APPROACH;  Surgeon: Mcarthur Rossetti, MD;  Location: [place];  Service: Orthopedics;  Laterality: Right;   TOTAL KNEE ARTHROPLASTY Right 12/23/2012   x3, 2 arthroscopies and a TKR,  TOTAL KNEE ARTHROPLASTY;  Surgeon: Hessie Dibble, MD;  Location: [place];  Service: Orthopedics;  Laterality: Right;  DEPUY, RNFA   WRIST SURGERY Right    x 3,  torn ligaments, pins placed then infected, then fused with plate    Social History   Socioeconomic History   Marital status: Single    Spouse name: Not on file   Number of children: 2   Years of education: Not on file   Highest education level: High school graduate  Occupational History   Occupation: Disabled  Tobacco Use   Smoking status: Never   Smokeless tobacco: Current    Types: Snuff  Vaping Use   Vaping Use: Never used  Substance and Sexual Activity   Alcohol use: No   Drug use: No   Sexual activity: Yes  Other Topics Concern   Not on file  Social History Narrative   Lives at home with his father (he takes care of his father)   Right handed   Drinks 2 cups of caffeine daily   Social Determinants of Health   Financial Resource Strain: Not on file  Food Insecurity: Not on file  Transportation Needs: Not on file  Physical Activity: Not on file  Stress: Not on file  Social Connections: Not on file    Family History  Problem Relation Age of Onset   COPD Mother        smoker   Hypertension Mother    Heart disease Mother        CHF   Diabetes Father    Heart disease Father        CABG in 57's   Stroke Father    Hypertension Father    Peripheral vascular disease Father    Hypertension Son        tachycardia   Heart disease Maternal Grandfather    Arthritis Paternal Grandmother    Colon cancer Neg Hx    Rectal cancer Neg Hx    Stomach cancer Neg Hx     Outpatient Encounter Medications as of 09/05/2022  Medication Sig   acetaminophen (TYLENOL) 500 MG tablet Take 1,000-1,500 mg by mouth 2 (two) times daily as needed for moderate pain.   acetaminophen-codeine (TYLENOL #3) 300-30 MG tablet TAKE 1 TABLET BY MOUTH AT BEDTIME. MAY TAKE A SECOND TABLET DURING THE DAY AS NEEDED FOR PAIN   atorvastatin (LIPITOR) 40 MG tablet Take 1 tablet (40 mg total) by mouth daily. (Patient taking differently: Take 40 mg by mouth at bedtime.)   clopidogrel (PLAVIX) 75 MG  tablet Take 1 tablet by mouth once daily with breakfast   Dulaglutide (TRULICITY) 4.5 ZD/6.3OV SOPN INJECT 1 SYRINGE SUBCUTANEOUSLY ONCE A WEEK   flecainide (TAMBOCOR) 50 MG tablet Take 1 tablet (50 mg total) by mouth 2 (two) times daily.   gabapentin (NEURONTIN) 300 MG capsule TAKE 1 CAPSULE BY MOUTH THREE TIMES DAILY  insulin aspart (NOVOLOG FLEXPEN) 100 UNIT/ML FlexPen Inject 1 Units into the skin 3 (three) times daily with meals.   insulin detemir (LEVEMIR) 100 UNIT/ML injection Inject 0.8 mLs (80 Units total) into the skin at bedtime. INJECT 90 UNITS SUBCUTANEOUSLY AT BEDTIME   isosorbide mononitrate (IMDUR) 30 MG 24 hr tablet Take 1 tablet (30 mg total) by mouth daily. Needs appointment for further refills   levothyroxine (SYNTHROID) 25 MCG tablet TAKE 1 TABLET BY MOUTH ONCE DAILY BEFORE BREAKFAST   lisinopril-hydrochlorothiazide (ZESTORETIC) 10-12.5 MG tablet Take 1 tablet by mouth daily.   metFORMIN (GLUMETZA) 1000 MG (MOD) 24 hr tablet Take 1 tablet (1,000 mg total) by mouth daily with breakfast. TAKE 2 TABLETS BY MOUTH TWICE DAILY WITH MEALS   metoprolol succinate (TOPROL-XL) 50 MG 24 hr tablet Take 1 tablet (50 mg total) by mouth every morning. And take 25 mg in the evening TAKE WITH OR IMMEDIATELY FOLLOWING A MEAL   nitroGLYCERIN (NITROSTAT) 0.4 MG SL tablet Place 1 tablet (0.4 mg total) under the tongue every 5 (five) minutes as needed.   omeprazole (PRILOSEC) 20 MG capsule Take 20 mg by mouth at bedtime.   rOPINIRole (REQUIP) 0.5 MG tablet Take 2 tablets (1 mg total) by mouth at bedtime.   [DISCONTINUED] escitalopram (LEXAPRO) 20 MG tablet Take 1 tablet (20 mg total) by mouth daily.   [DISCONTINUED] insulin aspart (NOVOLOG FLEXPEN) 100 UNIT/ML FlexPen Inject 25-35 units 3 times daily with meals (Patient taking differently: Inject 25 Units into the skin 2 (two) times daily with a meal.)   [DISCONTINUED] insulin detemir (LEVEMIR) 100 UNIT/ML injection INJECT 90 UNITS SUBCUTANEOUSLY AT  BEDTIME (Patient taking differently: 80 Units at bedtime. INJECT 90 UNITS SUBCUTANEOUSLY AT BEDTIME)   [DISCONTINUED] metFORMIN (GLUCOPHAGE-XR) 500 MG 24 hr tablet TAKE 2 TABLETS BY MOUTH TWICE DAILY WITH MEALS   No facility-administered encounter medications on file as of 09/05/2022.    ALLERGIES: Allergies  Allergen Reactions   Rosuvastatin Other (See Comments)    Joint pain   Toradol [Ketorolac Tromethamine] Itching    VACCINATION STATUS: Immunization History  Administered Date(s) Administered   Influenza,inj,Quad PF,6+ Mos 05/14/2019, 04/22/2020, 06/05/2021, 04/25/2022   Moderna Sars-Covid-2 Vaccination 03/25/2020, 04/22/2020   PNEUMOCOCCAL CONJUGATE-20 06/05/2021   Tdap 11/14/2020    Diabetes He presents for his initial diabetic visit. He has type 2 diabetes mellitus. Onset time: He was diagnosed at approximate age of 50 years. There are no hypoglycemic associated symptoms. Pertinent negatives for hypoglycemia include no confusion, headaches, pallor or seizures. Associated symptoms include polydipsia and polyuria. Pertinent negatives for diabetes include no chest pain, no fatigue, no polyphagia and no weakness. There are no hypoglycemic complications. Symptoms are worsening. There are no diabetic complications. Risk factors for coronary artery disease include family history, dyslipidemia, diabetes mellitus, hypertension, male sex, sedentary lifestyle and tobacco exposure. Current diabetic treatments: Is currently on Levemir 80 units nightly, NovoLog 15 units being, metformin 1 mg p.o. twice daily, Ozempic 0.5 mg subcutaneously weekly. His weight is fluctuating minimally. He is following a generally unhealthy diet. When asked about meal planning, he reported none. He has not had a previous visit with a dietitian. He rarely participates in exercise. His home blood glucose trend is fluctuating minimally. (He did not bring any logs with him.  He just started Dexcom CGM which he has not used  yet.  His point-of-care A1c was 8%.) An ACE inhibitor/angiotensin II receptor blocker is being taken. Eye exam is current.  Hypertension This is a chronic problem.  The current episode started more than 1 year ago. The problem is controlled. Pertinent negatives include no chest pain, headaches, neck pain, palpitations or shortness of breath. Risk factors for coronary artery disease include dyslipidemia, diabetes mellitus, male gender, obesity, sedentary lifestyle and family history. Past treatments include ACE inhibitors.  Hyperlipidemia This is a chronic problem. The current episode started more than 1 year ago. The problem is uncontrolled. Exacerbating diseases include diabetes and obesity. Pertinent negatives include no chest pain, myalgias or shortness of breath. Current antihyperlipidemic treatment includes statins. Risk factors for coronary artery disease include dyslipidemia, diabetes mellitus, hypertension, a sedentary lifestyle, obesity, male sex and family history.     Review of Systems  Constitutional:  Negative for chills, fatigue, fever and unexpected weight change.  HENT:  Negative for dental problem, mouth sores and trouble swallowing.   Eyes:  Negative for visual disturbance.  Respiratory:  Negative for cough, choking, chest tightness, shortness of breath and wheezing.   Cardiovascular:  Negative for chest pain, palpitations and leg swelling.  Gastrointestinal:  Negative for abdominal distention, abdominal pain, constipation, diarrhea, nausea and vomiting.  Endocrine: Positive for polydipsia and polyuria. Negative for polyphagia.  Genitourinary:  Negative for dysuria, flank pain, hematuria and urgency.  Musculoskeletal:  Negative for back pain, gait problem, myalgias and neck pain.  Skin:  Negative for pallor, rash and wound.  Neurological:  Negative for seizures, syncope, weakness, numbness and headaches.  Psychiatric/Behavioral:  Negative for confusion and dysphoric mood.      Objective:       09/05/2022    8:32 AM 06/14/2022   10:49 AM 04/25/2022   10:18 AM  Vitals with BMI  Height 6\' 0"  6\' 0"  6\' 0"   Weight 222 lbs 223 lbs 10 oz 225 lbs 13 oz  BMI 30.1 30.32 30.62  Systolic 98 138 120  Diastolic 76 82 78  Pulse 84 87 90    BP 98/76   Pulse 84   Ht 6' (1.829 m)   Wt 222 lb (100.7 kg)   BMI 30.11 kg/m   Wt Readings from Last 3 Encounters:  09/05/22 222 lb (100.7 kg)  06/14/22 223 lb 9.6 oz (101.4 kg)  04/25/22 225 lb 12.8 oz (102.4 kg)     Physical Exam Constitutional:      General: He is not in acute distress.    Appearance: He is well-developed.  HENT:     Head: Normocephalic and atraumatic.  Neck:     Thyroid: No thyromegaly.     Trachea: No tracheal deviation.  Cardiovascular:     Rate and Rhythm: Normal rate.     Pulses:          Dorsalis pedis pulses are 1+ on the right side and 1+ on the left side.       Posterior tibial pulses are 1+ on the right side and 1+ on the left side.     Heart sounds: Normal heart sounds, S1 normal and S2 normal. No murmur heard.    No gallop.  Pulmonary:     Effort: No respiratory distress.     Breath sounds: Normal breath sounds. No wheezing.  Abdominal:     General: Bowel sounds are normal. There is no distension.     Palpations: Abdomen is soft.     Tenderness: There is no abdominal tenderness. There is no guarding.  Musculoskeletal:     Right shoulder: No swelling or deformity.     Cervical back: Normal range of motion and  neck supple.  Skin:    General: Skin is warm and dry.     Findings: No rash.     Nails: There is no clubbing.  Neurological:     Mental Status: He is alert and oriented to person, place, and time.     Cranial Nerves: No cranial nerve deficit.     Sensory: No sensory deficit.     Gait: Gait normal.     Deep Tendon Reflexes: Reflexes are normal and symmetric.  Psychiatric:        Speech: Speech normal.        Behavior: Behavior normal. Behavior is cooperative.         Thought Content: Thought content normal.        Judgment: Judgment normal.       CMP ( most recent) CMP     Component Value Date/Time   NA 138 04/26/2022 0930   K 4.9 04/26/2022 0930   CL 99 04/26/2022 0930   CO2 24 04/26/2022 0930   GLUCOSE 104 (H) 04/26/2022 0930   GLUCOSE 244 (H) 03/28/2022 1430   BUN 14 04/26/2022 0930   CREATININE 1.03 04/26/2022 0930   CREATININE 0.89 04/21/2020 1448   CALCIUM 9.7 04/26/2022 0930   PROT 6.7 04/26/2022 0930   ALBUMIN 4.5 04/26/2022 0930   AST 21 04/26/2022 0930   ALT 21 04/26/2022 0930   ALKPHOS 68 04/26/2022 0930   BILITOT 1.6 (H) 04/26/2022 0930   GFRNONAA >60 03/28/2022 1430   GFRAA >60 07/20/2019 0927     Diabetic Labs (most recent): Lab Results  Component Value Date   HGBA1C 8.0 (A) 09/05/2022   HGBA1C 9.0 (H) 03/28/2022   HGBA1C 6.9 (H) 11/09/2021     Lipid Panel ( most recent) Lipid Panel     Component Value Date/Time   CHOL 191 06/05/2021 1352   TRIG (H) 06/05/2021 1352    1241.0 Triglyceride is over 400; calculations on Lipids are invalid.   HDL 33.00 (L) 06/05/2021 1352   CHOLHDL 6 06/05/2021 1352   VLDL 51 (H) 06/26/2019 0409   LDLCALC 68 04/21/2020 1448   LDLDIRECT 55.0 06/05/2021 1352      Lab Results  Component Value Date   TSH 1.430 04/26/2022   TSH 2.35 06/05/2021   TSH 2.18 11/14/2020   TSH 1.714 06/25/2019   TSH 2.297 06/08/2019   TSH 4.628 (H) 11/03/2018   TSH 1.44 12/11/2016   TSH 1.85 09/13/2016   TSH 3.49 05/26/2015   TSH 2.37 12/17/2014      Assessment & Plan:   1. Type 2 diabetes mellitus without complication, with long-term current use of insulin (HCC)  - Sean Horn has currently uncontrolled symptomatic type 2 DM since  62 years of age,  with most recent A1c of 8 %. Recent labs reviewed. - I had a long discussion with him about the possible risk factors and  the pathology behind its diabetes and its complications. -his diabetes is complicated by obesity's/sedentary life,  comorbid disease and he remains at a high risk for more acute and chronic complications which include CAD, CVA, CKD, retinopathy, and neuropathy. These are all discussed in detail with him.  - I discussed all available options of managing his diabetes including de-escalation of medications. I have counseled him on Food as Medicine by adopting a Whole Food , Plant Predominant  ( WFPP) nutrition as recommended by [employer] of Lifestyle Medicine. Patient is encouraged to switch to  unprocessed or minimally processed  complex starch, adequate protein intake (mainly plant source), minimal liquid fat, plenty of fruits, and vegetables. -  he is advised to stick to a routine mealtimes to eat 3 complete meals a day and snack only when necessary ( to snack only to correct hypoglycemia BG <70 day time or <100 at night).   - he acknowledges that there is a room for improvement in his food and drink choices. - Further Specific Suggestion is made for him to avoid simple carbohydrates  from his diet including Cakes, Sweet Desserts, Ice Cream, Soda (diet and regular), Sweet Tea, Candies, Chips, Cookies, Store Bought Juices, Alcohol ,  Artificial Sweeteners,  Coffee Creamer, and "Sugar-free" Products. This will help patient to have more stable blood glucose profile and potentially avoid unintended weight gain.  The following Lifestyle Medicine recommendations according to American College of Lifestyle Medicine [facility]) were discussed and offered to patient and he agrees to start the journey:  A.  Whole Foods, Plant-based plate comprising of fruits and vegetables, plant-based proteins, whole-grain carbohydrates was discussed in detail with the patient.   A list for source of those nutrients were also provided to the patient.  Patient will use only water or unsweetened tea for hydration. B.  The need to stay away from risky substances including alcohol, smoking; obtaining 7 to 9 hours of restorative sleep, at least 150  minutes of moderate intensity exercise weekly, the importance of healthy social connections,  and stress reduction techniques were discussed. C.  A full color page of  Calorie density of various food groups per pound showing examples of each food groups was provided to the patient.  - he will be scheduled with Norm Salt, RDN, CDE for individualized diabetes education.  - I have approached him with the following individualized plan to manage  his diabetes and patient agrees:   - he will feed from de-escalation in his complex insulin regimen.  To start, he is advised to continue his basal insulin Levemir 80 units nightly, advised to hold his NovoLog until next visit, advised to continue Ozempic 0.5 mg subcutaneous weekly, advised to lower metformin to 1000 mg XR p.o. daily at breakfast.  He is encouraged to use his CGM at least 4 times a day-and return in 10 days with his logs for reevaluation.   - he is encouraged to call clinic for blood glucose levels less than 70 or above 300 mg /dl. If he returns with significant postprandial hyperglycemia, he will be reconsidered for prandial insulin. -He has severe hypertriglyceridemia, GLP-1 receptor agonists will be titrated slowly to  lower the risk of pancreatitis.   - Specific targets for  A1c;  LDL, HDL,  and Triglycerides were discussed with the patient.  2) Blood Pressure /Hypertension:  his blood pressure is  controlled to target.   he is advised to continue his current medications including Zestoretic 10-12.5 mg p.o. daily with breakfast . 3) Lipids/Hyperlipidemia:   Review of his recent lipid panel showed un controlled triglycerides above 1000 mg per DL, controlled LDL at 68. he  is advised to continue    atorvastatin 40 mg p.o. nightly.  Side effects and precautions discussed with him.  4)  Weight/Diet:  Body mass index is 30.11 kg/m.  -   clearly complicating his diabetes care.   he is  a candidate for weight loss. I discussed with him the  fact that loss of 5 - 10% of his  current body weight will have the most impact on his  diabetes management.  The above detailed  ACLM recommendations for nutrition, exercise, sleep, social life, avoidance of risky substances, the need for restorative sleep   information will also detailed on discharge instructions.  5) hypothyroidism: The circumstance of his diagnosis are not available to review.  He is currently on low-dose levothyroxine 25 mcg p.o. daily.  He is advised to continue on the same.  6) Chronic Care/Health Maintenance:  -he  IS on ACEI/ARB and Statin medications and  is encouraged to initiate and continue to follow up with Ophthalmology, Dentist,  Podiatrist at least yearly or according to recommendations, and advised to   stay away from smoking. I have recommended yearly flu vaccine and pneumonia vaccine at least every 5 years; moderate intensity exercise for up to 150 minutes weekly; and  sleep for 7- 9 hours a day.  - he is  advised to maintain close follow up with Copland, Gay Filler, MD for primary care needs, as well as his other providers for optimal and coordinated care.   I spent 63 minutes in the care of the patient today including review of labs from [facility], Lipids, Thyroid Function, Hematology (current and previous including abstractions from other facilities); face-to-face time discussing  his blood glucose readings/logs, discussing hypoglycemia and hyperglycemia episodes and symptoms, medications doses, his options of short and long term treatment based on the latest standards of care / guidelines;  discussion about incorporating lifestyle medicine;  and documenting the encounter. Risk reduction counseling performed per USPSTF guidelines to reduce  obesity and cardiovascular risk factors.      Please refer to Patient Instructions for Blood Glucose Monitoring and Insulin/Medications Dosing Guide"  in media tab for additional information. Please  also refer to " Patient Self  Inventory" in the Media  tab for reviewed elements of pertinent patient history.  Roselee Nova participated in the discussions, expressed understanding, and voiced agreement with the above plans.  All questions were answered to his satisfaction. he is encouraged to contact clinic should he have any questions or concerns prior to his return visit.   Follow up plan: - Return in about 10 days (around 09/15/2022) for F/U with Meter/CGM /Logs Only - no Labs.  Glade Lloyd, MD [facility] [address] [place], [place] [address] Phone: [phone]  Fax: [phone]    09/05/2022, 12:38 PM  This note was partially dictated with voice recognition software. Similar sounding words can be transcribed inadequately or may not  be corrected upon review.

## 2022-09-05 NOTE — Patient Instructions (Signed)

## 2022-09-09 ENCOUNTER — Other Ambulatory Visit: Payer: Self-pay | Admitting: Cardiology

## 2022-09-09 DIAGNOSIS — I214 Non-ST elevation (NSTEMI) myocardial infarction: Secondary | ICD-10-CM

## 2022-09-17 DIAGNOSIS — E118 Type 2 diabetes mellitus with unspecified complications: Secondary | ICD-10-CM | POA: Diagnosis not present

## 2022-09-18 ENCOUNTER — Encounter: Payer: Self-pay | Admitting: "Endocrinology

## 2022-09-18 ENCOUNTER — Ambulatory Visit: Payer: Medicare HMO | Admitting: "Endocrinology

## 2022-09-18 VITALS — BP 96/66 | HR 76 | Ht 72.0 in | Wt 218.4 lb

## 2022-09-18 DIAGNOSIS — Z794 Long term (current) use of insulin: Secondary | ICD-10-CM

## 2022-09-18 DIAGNOSIS — E039 Hypothyroidism, unspecified: Secondary | ICD-10-CM

## 2022-09-18 DIAGNOSIS — I10 Essential (primary) hypertension: Secondary | ICD-10-CM | POA: Diagnosis not present

## 2022-09-18 DIAGNOSIS — E119 Type 2 diabetes mellitus without complications: Secondary | ICD-10-CM

## 2022-09-18 DIAGNOSIS — E782 Mixed hyperlipidemia: Secondary | ICD-10-CM

## 2022-09-18 NOTE — Progress Notes (Signed)
Endocrinology follow-up note       09/18/2022, 12:26 PM   Subjective:    Patient ID: Sean Horn, male    DOB: 1961/05/10.  Sean Horn is being seen in follow-up after he was seen in consultation for management of currently uncontrolled symptomatic diabetes requested by  Copland, Gay Filler, MD.   Past Medical History:  Diagnosis Date   Anxiety    CAD in native artery    a. cath 06/26/2019 - 80% apical LAD >> medical management    Chronic headaches    Chronic neck pain    Coronary artery disease involving native coronary artery of native heart without angina pectoris 11/06/2019   GERD (gastroesophageal reflux disease)    occasional - OTC as needed   History of chicken pox    History of kidney stones    History of shingles    Hypertension    under control with med., had been on med. 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   And left now    SVT (supraventricular tachycardia) 06/26/2019   Tachycardia    TIA (transient ischemic attack) 11/03/2018   Type 2 diabetes mellitus with complication, with long-term current use of insulin ([place])    Follows with endocrinology, Dr Carlis Abbott reports his last hgba1c was 7.3    Urinary frequency 12/11/2016    Past Surgical History:  Procedure Laterality Date   ANTERIOR CERVICAL DECOMP/DISCECTOMY FUSION N/A 01/16/2018   Procedure: ANTERIOR CERVICAL DECOMPRESSION/DISCECTOMY FUSION CERVICAL FOUR-FIVE, CERVICAL FIVE-SIX;   Surgeon: Consuella Lose, MD;  Location: [facility];  Service: Neurosurgery;  Laterality: N/A;  ANTERIOR CERVICAL DECOMPRESSION/DISCECTOMY FUSION CERVICAL FOUR-FIVE, CERVICAL FIVE-SIX   CHOLECYSTECTOMY  1980's   CYSTOSCOPY/RETROGRADE/URETEROSCOPY/STONE EXTRACTION WITH BASKET Right 08/27/2003   and double J stent placement   ELECTROPHYSIOLOGY STUDY N/A 07/23/2019   Procedure: ELECTROPHYSIOLOGY STUDY;  Surgeon: Evans Lance, MD;  Location: [facility];  Service: Cardiovascular;  Laterality: N/A;   INGUINAL HERNIA REPAIR Left 1990's   KNEE ARTHROSCOPY Left 1980's   KNEE ARTHROSCOPY Right 2013   KNEE ARTHROSCOPY Right 06/30/2013   Procedure: RIGHT KNEE ARTHROSCOPY AND SYNOVECTOMY;  Surgeon: Hessie Dibble, MD;  Location: [facility];  Service: Orthopedics;  Laterality: Right;   LEFT HEART CATH AND CORONARY ANGIOGRAPHY N/A 06/26/2019   Procedure: LEFT HEART CATH AND CORONARY ANGIOGRAPHY;  Surgeon: Leonie Man, MD;  Location: [facility];  Service: Cardiovascular;  Laterality: N/A;   LITHOTRIPSY     multiple, b/l   SHOULDER ADHESION RELEASE Left ` 1998   SHOULDER ARTHROSCOPY W/ ROTATOR CUFF REPAIR Left 10/20/2004   x 4, had to have part of clavile  removed. torn rotator cuff multiple times, had to place screws    SHOULDER ARTHROSCOPY W/ SUPERIOR LABRAL ANTERIOR POSTERIOR LESION REPAIR Left 12/22/2004   SVT ABLATION N/A 07/23/2019   Procedure: SVT ABLATION;  Surgeon: Evans Lance, MD;  Location: [facility];  Service: Cardiovascular;  Laterality: N/A;   TOTAL HIP ARTHROPLASTY Right 10/10/2021   Procedure: Right TOTAL HIP ARTHROPLASTY ANTERIOR APPROACH;  Surgeon: Mcarthur Rossetti, MD;  Location: [facility];  Service: Orthopedics;  Laterality: Right;   TOTAL KNEE ARTHROPLASTY Right 12/23/2012   x3, 2 arthroscopies and a TKR,  TOTAL KNEE ARTHROPLASTY;  Surgeon: Hessie Dibble, MD;  Location: [facility];  Service: Orthopedics;  Laterality: Right;  DEPUY, RNFA    WRIST SURGERY Right    x 3, torn ligaments, pins placed then infected, then fused with plate    Social History   Socioeconomic History   Marital status: Single    Spouse name: Not on file   Number of children: 2   Years of education: Not on file   Highest education level: High school graduate  Occupational History   Occupation: Disabled  Tobacco Use   Smoking status: Never   Smokeless tobacco: Current    Types: Snuff  Vaping Use   Vaping Use: Never used  Substance and Sexual Activity   Alcohol use: No   Drug use: No   Sexual activity: Yes  Other Topics Concern   Not on file  Social History Narrative   Lives at home with his father (he takes care of his father)   Right handed   Drinks 2 cups of caffeine daily   Social Determinants of Health   Financial Resource Strain: Not on file  Food Insecurity: Not on file  Transportation Needs: Not on file  Physical Activity: Not on file  Stress: Not on file  Social Connections: Not on file    Family History  Problem Relation Age of Onset   COPD Mother        smoker   Hypertension Mother    Heart disease Mother        CHF   Diabetes Father    Heart disease Father        CABG in 3's   Stroke Father    Hypertension Father    Peripheral vascular disease Father    Hypertension Son        tachycardia   Heart disease Maternal Grandfather    Arthritis Paternal Grandmother    Colon cancer Neg Hx    Rectal cancer Neg Hx    Stomach cancer Neg Hx     Outpatient Encounter Medications as of 09/18/2022  Medication Sig   acetaminophen (TYLENOL) 500 MG tablet Take 1,000-1,500 mg by mouth 2 (two) times daily as needed for moderate pain.   acetaminophen-codeine (TYLENOL #3) 300-30 MG tablet TAKE 1 TABLET BY MOUTH AT BEDTIME. MAY TAKE A SECOND TABLET DURING THE DAY AS NEEDED FOR PAIN   atorvastatin (LIPITOR) 40 MG tablet Take 1 tablet (40 mg total) by mouth daily. (Patient taking differently: Take 40 mg by mouth at bedtime.)    clopidogrel (PLAVIX) 75 MG tablet Take 1 tablet by mouth once daily with breakfast   escitalopram (LEXAPRO) 20 MG tablet Take 1 tablet by mouth once daily   flecainide (TAMBOCOR) 50 MG tablet Take 1 tablet (50 mg total) by mouth 2 (two) times daily.   gabapentin (NEURONTIN) 300 MG capsule TAKE 1 CAPSULE BY MOUTH THREE TIMES DAILY  insulin aspart (NOVOLOG FLEXPEN) 100 UNIT/ML FlexPen Inject 1 Units into the skin 3 (three) times daily with meals.   insulin detemir (LEVEMIR) 100 UNIT/ML injection Inject 0.8 mLs (80 Units total) into the skin at bedtime. INJECT 90 UNITS SUBCUTANEOUSLY AT BEDTIME (Patient taking differently: Inject 80 Units into the skin at bedtime.)   isosorbide mononitrate (IMDUR) 30 MG 24 hr tablet Take 1 tablet (30 mg total) by mouth daily. Please keep scheduled appointment for further refils   levothyroxine (SYNTHROID) 25 MCG tablet TAKE 1 TABLET BY MOUTH ONCE DAILY BEFORE BREAKFAST   lisinopril-hydrochlorothiazide (ZESTORETIC) 10-12.5 MG tablet Take 1 tablet by mouth daily.   metFORMIN (GLUMETZA) 1000 MG (MOD) 24 hr tablet Take 1 tablet (1,000 mg total) by mouth daily with breakfast. TAKE 2 TABLETS BY MOUTH TWICE DAILY WITH MEALS   metoprolol succinate (TOPROL-XL) 50 MG 24 hr tablet Take 1 tablet (50 mg total) by mouth every morning. And take 25 mg in the evening TAKE WITH OR IMMEDIATELY FOLLOWING A MEAL   nitroGLYCERIN (NITROSTAT) 0.4 MG SL tablet Place 1 tablet (0.4 mg total) under the tongue every 5 (five) minutes as needed.   omeprazole (PRILOSEC) 20 MG capsule Take 20 mg by mouth at bedtime.   rOPINIRole (REQUIP) 0.5 MG tablet Take 2 tablets (1 mg total) by mouth at bedtime.   Semaglutide,0.25 or 0.5MG/DOS, (OZEMPIC, 0.25 OR 0.5 MG/DOSE,) 2 MG/3ML SOPN Inject 0.5 mg into the skin once a week.   No facility-administered encounter medications on file as of 09/18/2022.    ALLERGIES: Allergies  Allergen Reactions   Rosuvastatin Other (See Comments)    Joint pain   Toradol  [Ketorolac Tromethamine] Itching    VACCINATION STATUS: Immunization History  Administered Date(s) Administered   Influenza,inj,Quad PF,6+ Mos 05/14/2019, 04/22/2020, 06/05/2021, 04/25/2022   Moderna Sars-Covid-2 Vaccination 03/25/2020, 04/22/2020   PNEUMOCOCCAL CONJUGATE-20 06/05/2021   Tdap 11/14/2020    Diabetes He presents for his follow-up diabetic visit. He has type 2 diabetes mellitus. Onset time: He was diagnosed at approximate age of 14 years. His disease course has been improving. There are no hypoglycemic associated symptoms. Pertinent negatives for hypoglycemia include no confusion, headaches, pallor or seizures. Associated symptoms include polydipsia and polyuria. Pertinent negatives for diabetes include no chest pain, no fatigue, no polyphagia and no weakness. There are no hypoglycemic complications. Symptoms are improving. There are no diabetic complications. Risk factors for coronary artery disease include family history, dyslipidemia, diabetes mellitus, hypertension, male sex, sedentary lifestyle and tobacco exposure. Current diabetic treatments: Is currently on Levemir 80 units nightly, NovoLog 15 units being, metformin 1 mg p.o. twice daily, Ozempic 0.5 mg subcutaneously weekly. His weight is decreasing steadily. He is following a generally unhealthy diet. When asked about meal planning, he reported none. He has not had a previous visit with a dietitian. He rarely participates in exercise. His home blood glucose trend is decreasing steadily. His breakfast blood glucose range is generally 180-200 mg/dl. His lunch blood glucose range is generally 180-200 mg/dl. His dinner blood glucose range is generally 140-180 mg/dl. His bedtime blood glucose range is generally 140-180 mg/dl. His overall blood glucose range is 180-200 mg/dl. (He returns with significant improvement in his glycemic profile despite the fact that his prandial insulin was discontinued.  His Dexcom CGM is now in use  showing 48% time in range, 40% level 1 hyperglycemia, 11% level 2 hyperglycemia.  His point-of-care A1c was 8% during his first visit.  He did not document or report any hypoglycemia.) An  ACE inhibitor/angiotensin II receptor blocker is being taken. Eye exam is current.  Hypertension This is a chronic problem. The current episode started more than 1 year ago. The problem is controlled. Pertinent negatives include no chest pain, headaches, neck pain, palpitations or shortness of breath. Risk factors for coronary artery disease include dyslipidemia, diabetes mellitus, male gender, obesity, sedentary lifestyle and family history. Past treatments include ACE inhibitors.  Hyperlipidemia This is a chronic problem. The current episode started more than 1 year ago. The problem is uncontrolled. Exacerbating diseases include diabetes and obesity. Pertinent negatives include no chest pain, myalgias or shortness of breath. Current antihyperlipidemic treatment includes statins. Risk factors for coronary artery disease include dyslipidemia, diabetes mellitus, hypertension, a sedentary lifestyle, obesity, male sex and family history.     Review of Systems  Constitutional:  Negative for chills, fatigue, fever and unexpected weight change.  HENT:  Negative for dental problem, mouth sores and trouble swallowing.   Eyes:  Negative for visual disturbance.  Respiratory:  Negative for cough, choking, chest tightness, shortness of breath and wheezing.   Cardiovascular:  Negative for chest pain, palpitations and leg swelling.  Gastrointestinal:  Negative for abdominal distention, abdominal pain, constipation, diarrhea, nausea and vomiting.  Endocrine: Positive for polydipsia and polyuria. Negative for polyphagia.  Genitourinary:  Negative for dysuria, flank pain, hematuria and urgency.  Musculoskeletal:  Negative for back pain, gait problem, myalgias and neck pain.  Skin:  Negative for pallor, rash and wound.   Neurological:  Negative for seizures, syncope, weakness, numbness and headaches.  Psychiatric/Behavioral:  Negative for confusion and dysphoric mood.     Objective:       09/18/2022    9:16 AM 09/05/2022    8:32 AM 06/14/2022   10:49 AM  Vitals with BMI  Height 6' 0"$  6' 0"$  6' 0"$   Weight 218 lbs 6 oz 222 lbs 223 lbs 10 oz  BMI 29.61 0000000 [id number]  Systolic 96 98 0000000  Diastolic 66 76 82  Pulse 76 84 87    BP 96/66   Pulse 76   Ht 6' (1.829 m)   Wt 218 lb 6.4 oz (99.1 kg)   BMI 29.62 kg/m   Wt Readings from Last 3 Encounters:  09/18/22 218 lb 6.4 oz (99.1 kg)  09/05/22 222 lb (100.7 kg)  06/14/22 223 lb 9.6 oz (101.4 kg)         CMP ( most recent) CMP     Component Value Date/Time   NA 138 04/26/2022 0930   K 4.9 04/26/2022 0930   CL 99 04/26/2022 0930   CO2 24 04/26/2022 0930   GLUCOSE 104 (H) 04/26/2022 0930   GLUCOSE 244 (H) 03/28/2022 1430   BUN 14 04/26/2022 0930   CREATININE 1.03 04/26/2022 0930   CREATININE 0.89 04/21/2020 1448   CALCIUM 9.7 04/26/2022 0930   PROT 6.7 04/26/2022 0930   ALBUMIN 4.5 04/26/2022 0930   AST 21 04/26/2022 0930   ALT 21 04/26/2022 0930   ALKPHOS 68 04/26/2022 0930   BILITOT 1.6 (H) 04/26/2022 0930   GFRNONAA >60 03/28/2022 1430   GFRAA >60 07/20/2019 0927     Diabetic Labs (most recent): Lab Results  Component Value Date   HGBA1C 8.0 (A) 09/05/2022   HGBA1C 9.0 (H) 03/28/2022   HGBA1C 6.9 (H) 11/09/2021     Lipid Panel ( most recent) Lipid Panel     Component Value Date/Time   CHOL 191 06/05/2021 1352   TRIG (H) 06/05/2021 1352  1241.0 Triglyceride is over 400; calculations on Lipids are invalid.   HDL 33.00 (L) 06/05/2021 1352   CHOLHDL 6 06/05/2021 1352   VLDL 51 (H) 06/26/2019 0409   LDLCALC 68 04/21/2020 1448   LDLDIRECT 55.0 06/05/2021 1352      Lab Results  Component Value Date   TSH 1.430 04/26/2022   TSH 2.35 06/05/2021   TSH 2.18 11/14/2020   TSH 1.714 06/25/2019   TSH 2.297 06/08/2019    TSH 4.628 (H) 11/03/2018   TSH 1.44 12/11/2016   TSH 1.85 09/13/2016   TSH 3.49 05/26/2015   TSH 2.37 12/17/2014      Assessment & Plan:   1. Type 2 diabetes mellitus without complication, with long-term current use of insulin ([facility])  - Sean Horn has currently uncontrolled symptomatic type 2 DM since  62 years of age. He returns with significant improvement in his glycemic profile despite the fact that his prandial insulin was discontinued.  His Dexcom CGM is now in use showing 48% time in range, 40% level 1 hyperglycemia, 11% level 2 hyperglycemia.  His point-of-care A1c was 8% during his first visit.  He did not document or report any hypoglycemia.  Recent labs reviewed. - I had a long discussion with him about the possible risk factors and  the pathology behind its diabetes and its complications. -his diabetes is complicated by obesity's/sedentary life, comorbid disease and he remains at a high risk for more acute and chronic complications which include CAD, CVA, CKD, retinopathy, and neuropathy. These are all discussed in detail with him.  - I discussed all available options of managing his diabetes including de-escalation of medications. I have counseled him on Food as Medicine by adopting a Whole Food , Plant Predominant  ( WFPP) nutrition as recommended by [employer] of Lifestyle Medicine. Patient is encouraged to switch to  unprocessed or minimally processed  complex starch, adequate protein intake (mainly plant source), minimal liquid fat, plenty of fruits, and vegetables. -  he is advised to stick to a routine mealtimes to eat 3 complete meals a day and snack only when necessary ( to snack only to correct hypoglycemia BG <70 day time or <100 at night).  - he acknowledges that there is a room for improvement in his food and drink choices. - Suggestion is made for him to avoid simple carbohydrates  from his diet including Cakes, Sweet Desserts, Ice Cream, Soda (diet and  regular), Sweet Tea, Candies, Chips, Cookies, Store Bought Juices, Alcohol , Artificial Sweeteners,  Coffee Creamer, and "Sugar-free" Products, Lemonade. This will help patient to have more stable blood glucose profile and potentially avoid unintended weight gain.  The following Lifestyle Medicine recommendations according to [place]  [facility]) were discussed and and offered to patient and he  agrees to start the journey:  A. Whole Foods, Plant-Based Nutrition comprising of fruits and vegetables, plant-based proteins, whole-grain carbohydrates was discussed in detail with the patient.   A list for source of those nutrients were also provided to the patient.  Patient will use only water or unsweetened tea for hydration. B.  The need to stay away from risky substances including alcohol, smoking; obtaining 7 to 9 hours of restorative sleep, at least 150 minutes of moderate intensity exercise weekly, the importance of healthy social connections,  and stress management techniques were discussed. C.  A full color page of  Calorie density of various food groups per pound showing examples of each food  groups was provided to the patient.   - he will be scheduled with Jearld Fenton, RDN, CDE for individualized diabetes education.  - I have approached him with the following individualized plan to manage  his diabetes and patient agrees:   -He did better with less medications.  He is advised to continue Levemir 80 units nightly, stay off of NovoLog for now.  She will continue to benefit from Ozempic 0.5 mg subcutaneously weekly and metformin 1000 mg XR p.o. daily at breakfast.   - he is encouraged to call clinic for blood glucose levels less than 70 or above 300 mg /dl. If he returns with significant postprandial hyperglycemia, he will be reconsidered for prandial insulin. -He has severe hypertriglyceridemia, GLP-1 receptor agonists will be titrated slowly to  lower the risk of  pancreatitis.   - Specific targets for  A1c;  LDL, HDL,  and Triglycerides were discussed with the patient.  2) Blood Pressure /Hypertension: His blood pressure is controlled to target.   he is advised to continue his current medications including Zestoretic 10-12.5 mg p.o. daily with breakfast . He has a chance on the prescription on blood pressure medications next visit. 3) Lipids/Hyperlipidemia:   Review of his recent lipid panel showed un controlled triglycerides above 1000 mg per DL, controlled LDL at 68. he  is advised to continue    atorvastatin 40 mg p.o. nightly.  Side effects and precautions discussed with him.  4)  Weight/Diet:  Body mass index is 29.62 kg/m.  -   clearly complicating his diabetes care.   he is  a candidate for weight loss. I discussed with him the fact that loss of 5 - 10% of his  current body weight will have the most impact on his diabetes management.  The above detailed  ACLM recommendations for nutrition, exercise, sleep, social life, avoidance of risky substances, the need for restorative sleep   information will also detailed on discharge instructions.  5) hypothyroidism: The circumstance of his diagnosis are not available to review.  He is currently on low-dose levothyroxine 25 mcg p.o. daily.  He is advised to continue on the same.  - We discussed about the correct intake of his thyroid hormone, on empty stomach at fasting, with water, separated by at least 30 minutes from breakfast and other medications,  and separated by more than 4 hours from calcium, iron, multivitamins, acid reflux medications (PPIs). -Patient is made aware of the fact that thyroid hormone replacement is needed for life, dose to be adjusted by periodic monitoring of thyroid function tests.   6) Chronic Care/Health Maintenance:  -he  IS on ACEI/ARB and Statin medications and  is encouraged to initiate and continue to follow up with Ophthalmology, Dentist,  Podiatrist at least yearly or  according to recommendations, and advised to   stay away from smoking. I have recommended yearly flu vaccine and pneumonia vaccine at least every 5 years; moderate intensity exercise for up to 150 minutes weekly; and  sleep for 7- 9 hours a day.  - he is  advised to maintain close follow up with Copland, Gay Filler, MD for primary care needs, as well as his other providers for optimal and coordinated care.   I spent  26  minutes in the care of the patient today including review of labs from [facility], Lipids, Thyroid Function, Hematology (current and previous including abstractions from other facilities); face-to-face time discussing  his blood glucose readings/logs, discussing hypoglycemia and hyperglycemia episodes and symptoms,  medications doses, his options of short and long term treatment based on the latest standards of care / guidelines;  discussion about incorporating lifestyle medicine;  and documenting the encounter. Risk reduction counseling performed per USPSTF guidelines to reduce  obesity and cardiovascular risk factors.     Please refer to Patient Instructions for Blood Glucose Monitoring and Insulin/Medications Dosing Guide"  in media tab for additional information. Please  also refer to " Patient Self Inventory" in the Media  tab for reviewed elements of pertinent patient history.  Roselee Nova participated in the discussions, expressed understanding, and voiced agreement with the above plans.  All questions were answered to his satisfaction. he is encouraged to contact clinic should he have any questions or concerns prior to his return visit.    Follow up plan: - Return in about 3 months (around 12/17/2022) for F/U with Pre-visit Labs, Meter/CGM/Logs, A1c here.  Glade Lloyd, MD [facility] [address] [place], [place] [address] Phone: [phone]  Fax: [phone]    09/18/2022, 12:26 PM  This note was partially dictated  with voice recognition software. Similar sounding words can be transcribed inadequately or may not  be corrected upon review.

## 2022-09-18 NOTE — Patient Instructions (Signed)
                                     Advice for Weight Management  -For most of us the best way to lose weight is by diet management. Generally speaking, diet management means consuming less calories intentionally which over time brings about progressive weight loss.  This can be achieved more effectively by avoiding ultra processed carbohydrates, processed meats, unhealthy fats.    It is critically important to know your numbers: how much calorie you are consuming and how much calorie you need. More importantly, our carbohydrates sources should be unprocessed naturally occurring  complex starch food items.  It is always important to balance nutrition also by  appropriate intake of proteins (mainly plant-based), healthy fats/oils, plenty of fruits and vegetables.   -The American College of Lifestyle Medicine (ACL M) recommends nutrition derived mostly from Whole Food, Plant Predominant Sources example an apple instead of applesauce or apple pie. Eat Plenty of vegetables, Mushrooms, fruits, Legumes, Whole Grains, Nuts, seeds in lieu of processed meats, processed snacks/pastries red meat, poultry, eggs.  Use only water or unsweetened tea for hydration.  The College also recommends the need to stay away from risky substances including alcohol, smoking; obtaining 7-9 hours of restorative sleep, at least 150 minutes of moderate intensity exercise weekly, importance of healthy social connections, and being mindful of stress and seek help when it is overwhelming.    -Sticking to a routine mealtime to eat 3 meals a day and avoiding unnecessary snacks is shown to have a big role in weight control. Under normal circumstances, the only time we burn stored energy is when we are hungry, so allow  some hunger to take place- hunger means no food between appropriate meal times, only water.  It is not advisable to starve.   -It is better to avoid simple carbohydrates including:  Cakes, Sweet Desserts, Ice Cream, Soda (diet and regular), Sweet Tea, Candies, Chips, Cookies, Store Bought Juices, Alcohol in Excess of  1-2 drinks a day, Lemonade,  Artificial Sweeteners, Doughnuts, Coffee Creamers, "Sugar-free" Products, etc, etc.  This is not a complete list.....    -Consulting with certified diabetes educators is proven to provide you with the most accurate and current information on diet.  Also, you may be  interested in discussing diet options/exchanges , we can schedule a visit with Sean Horn, RDN, CDE for individualized nutrition education.  -Exercise: If you are able: 30 -60 minutes a day ,4 days a week, or 150 minutes of moderate intensity exercise weekly.    The longer the better if tolerated.  Combine stretch, strength, and aerobic activities.  If you were told in the past that you have high risk for cardiovascular diseases, or if you are currently symptomatic, you may seek evaluation by your heart doctor prior to initiating moderate to intense exercise programs.                                  Additional Care Considerations for Diabetes/Prediabetes   -Diabetes  is a chronic disease.  The most important care consideration is regular follow-up with your diabetes care provider with the goal being avoiding or delaying its complications and to take advantage of advances in medications and technology.  If appropriate actions are taken early enough, type 2 diabetes can even be   reversed.  Seek information from the right source.  - Whole Food, Plant Predominant Nutrition is highly recommended: Eat Plenty of vegetables, Mushrooms, fruits, Legumes, Whole Grains, Nuts, seeds in lieu of processed meats, processed snacks/pastries red meat, poultry, eggs as recommended by American College of  Lifestyle Medicine (ACLM).  -Type 2 diabetes is known to coexist with other important comorbidities such as high blood pressure and high cholesterol.  It is critical to control not only the  diabetes but also the high blood pressure and high cholesterol to minimize and delay the risk of complications including coronary artery disease, stroke, amputations, blindness, etc.  The good news is that this diet recommendation for type 2 diabetes is also very helpful for managing high cholesterol and high blood blood pressure.  - Studies showed that people with diabetes will benefit from a class of medications known as ACE inhibitors and statins.  Unless there are specific reasons not to be on these medications, the standard of care is to consider getting one from these groups of medications at an optimal doses.  These medications are generally considered safe and proven to help protect the heart and the kidneys.    - People with diabetes are encouraged to initiate and maintain regular follow-up with eye doctors, foot doctors, dentists , and if necessary heart and kidney doctors.     - It is highly recommended that people with diabetes quit smoking or stay away from smoking, and get yearly  flu vaccine and pneumonia vaccine at least every 5 years.  See above for additional recommendations on exercise, sleep, stress management , and healthy social connections.      

## 2022-09-19 ENCOUNTER — Ambulatory Visit: Payer: Medicare HMO | Attending: Internal Medicine

## 2022-09-19 DIAGNOSIS — I2510 Atherosclerotic heart disease of native coronary artery without angina pectoris: Secondary | ICD-10-CM | POA: Diagnosis not present

## 2022-09-19 DIAGNOSIS — I10 Essential (primary) hypertension: Secondary | ICD-10-CM

## 2022-09-19 DIAGNOSIS — R002 Palpitations: Secondary | ICD-10-CM | POA: Diagnosis not present

## 2022-09-20 LAB — EXERCISE TOLERANCE TEST
Angina Index: 0
Base ST Depression (mm): 0 mm
Duke Treadmill Score: 9
Estimated workload: 8.5
Exercise duration (min): 8 min
Exercise duration (sec): 38 s
MPHR: 159 {beats}/min
Peak HR: 137 {beats}/min
Percent HR: 86 %
RPE: 15
Rest HR: 86 {beats}/min
ST Depression (mm): 0 mm

## 2022-09-21 ENCOUNTER — Ambulatory Visit: Payer: Medicare HMO | Attending: Internal Medicine | Admitting: Internal Medicine

## 2022-09-21 ENCOUNTER — Encounter: Payer: Self-pay | Admitting: Internal Medicine

## 2022-09-21 VITALS — BP 112/70 | HR 71 | Ht 72.0 in | Wt 218.4 lb

## 2022-09-21 DIAGNOSIS — I471 Supraventricular tachycardia, unspecified: Secondary | ICD-10-CM | POA: Diagnosis not present

## 2022-09-21 MED ORDER — LISINOPRIL 10 MG PO TABS: 10.0000 mg | ORAL_TABLET | Freq: Every day | ORAL | 3 refills | Status: DC

## 2022-09-21 NOTE — Progress Notes (Signed)
HPI [name] returns today for followup. He has a h/o palpitations which have been nicely controlled on flecainide. No chest pain, sob or syncope. When I saw him last he was pending a hip replacement but his symptoms improved and he cancelled. He notes that his bp has been a little on the low side. No other complaints.  Allergies  Allergen Reactions   Rosuvastatin Other (See Comments)    Joint pain   Toradol [Ketorolac Tromethamine] Itching     Current Outpatient Medications  Medication Sig Dispense Refill   acetaminophen (TYLENOL) 500 MG tablet Take 1,000-1,500 mg by mouth 2 (two) times daily as needed for moderate pain.     acetaminophen-codeine (TYLENOL #3) 300-30 MG tablet TAKE 1 TABLET BY MOUTH AT BEDTIME. MAY TAKE A SECOND TABLET DURING THE DAY AS NEEDED FOR PAIN 40 tablet 0   atorvastatin (LIPITOR) 40 MG tablet Take 1 tablet (40 mg total) by mouth daily. (Patient taking differently: Take 40 mg by mouth at bedtime.) 90 tablet 3   clopidogrel (PLAVIX) 75 MG tablet Take 1 tablet by mouth once daily with breakfast 90 tablet 3   escitalopram (LEXAPRO) 20 MG tablet Take 1 tablet by mouth once daily 90 tablet 0   flecainide (TAMBOCOR) 50 MG tablet Take 1 tablet (50 mg total) by mouth 2 (two) times daily. 180 tablet 3   gabapentin (NEURONTIN) 300 MG capsule TAKE 1 CAPSULE BY MOUTH THREE TIMES DAILY 90 capsule 5   insulin aspart (NOVOLOG FLEXPEN) 100 UNIT/ML FlexPen Inject 1 Units into the skin 3 (three) times daily with meals. 30 mL 5   insulin detemir (LEVEMIR) 100 UNIT/ML injection Inject 0.8 mLs (80 Units total) into the skin at bedtime. INJECT 90 UNITS SUBCUTANEOUSLY AT BEDTIME (Patient taking differently: Inject 80 Units into the skin at bedtime.) 10 mL 0   isosorbide mononitrate (IMDUR) 30 MG 24 hr tablet Take 1 tablet (30 mg total) by mouth daily. Please keep scheduled appointment for further refils 90 tablet 0   levothyroxine (SYNTHROID) 25 MCG tablet TAKE 1 TABLET BY MOUTH  ONCE DAILY BEFORE BREAKFAST 90 tablet 1   lisinopril-hydrochlorothiazide (ZESTORETIC) 10-12.5 MG tablet Take 1 tablet by mouth daily. 90 tablet 3   metFORMIN (GLUMETZA) 1000 MG (MOD) 24 hr tablet Take 1 tablet (1,000 mg total) by mouth daily with breakfast. TAKE 2 TABLETS BY MOUTH TWICE DAILY WITH MEALS 180 tablet 1   metoprolol succinate (TOPROL-XL) 50 MG 24 hr tablet Take 1 tablet (50 mg total) by mouth every morning. And take 25 mg in the evening TAKE WITH OR IMMEDIATELY FOLLOWING A MEAL 135 tablet 1   nitroGLYCERIN (NITROSTAT) 0.4 MG SL tablet Place 1 tablet (0.4 mg total) under the tongue every 5 (five) minutes as needed. 25 tablet 12   omeprazole (PRILOSEC) 20 MG capsule Take 20 mg by mouth at bedtime.     rOPINIRole (REQUIP) 0.5 MG tablet Take 2 tablets (1 mg total) by mouth at bedtime. 180 tablet 1   Semaglutide,0.25 or 0.5MG/DOS, (OZEMPIC, 0.25 OR 0.5 MG/DOSE,) 2 MG/3ML SOPN Inject 0.5 mg into the skin once a week. 3 mL 2   No current facility-administered medications for this visit.     Past Medical History:  Diagnosis Date   Anxiety    CAD in native artery    a. cath 06/26/2019 - 80% apical LAD >> medical management    Chronic headaches    Chronic neck pain    Coronary artery disease involving  native coronary artery of native heart without angina pectoris 11/06/2019   GERD (gastroesophageal reflux disease)    occasional - OTC as needed   History of chicken pox    History of kidney stones    History of shingles    Hypertension    under control with med., had been on med. 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   Past Surgical History:  Procedure Laterality Date   ANTERIOR CERVICAL DECOMP/DISCECTOMY FUSION N/A 01/16/2018   Procedure: ANTERIOR CERVICAL DECOMPRESSION/DISCECTOMY FUSION CERVICAL FOUR-FIVE, CERVICAL FIVE-SIX;  Surgeon: Consuella Lose, MD;  Location: [facility];  Service: Neurosurgery;  Laterality: N/A;  ANTERIOR CERVICAL DECOMPRESSION/DISCECTOMY FUSION CERVICAL FOUR-FIVE, CERVICAL FIVE-SIX   CHOLECYSTECTOMY  1980's   CYSTOSCOPY/RETROGRADE/URETEROSCOPY/STONE EXTRACTION WITH BASKET Right 08/27/2003   and double J stent placement   ELECTROPHYSIOLOGY STUDY N/A 07/23/2019   Procedure: ELECTROPHYSIOLOGY STUDY;  Surgeon: Evans Lance, MD;  Location: [facility];  Service: Cardiovascular;  Laterality: N/A;   INGUINAL HERNIA REPAIR Left 1990's   KNEE ARTHROSCOPY Left 1980's   KNEE ARTHROSCOPY Right 2013   KNEE ARTHROSCOPY Right 06/30/2013   Procedure: RIGHT KNEE ARTHROSCOPY AND SYNOVECTOMY;  Surgeon: Hessie Dibble, MD;  Location: [facility];  Service: Orthopedics;  Laterality: Right;   LEFT HEART CATH AND CORONARY ANGIOGRAPHY N/A 06/26/2019   Procedure: LEFT HEART CATH AND CORONARY ANGIOGRAPHY;  Surgeon: Leonie Man, MD;  Location: [facility];  Service: Cardiovascular;  Laterality: N/A;   LITHOTRIPSY     multiple, b/l   SHOULDER ADHESION RELEASE Left ` 1998   SHOULDER ARTHROSCOPY W/ ROTATOR CUFF REPAIR Left 10/20/2004   x 4, had to have part of clavile removed. torn rotator cuff  multiple times, had to place screws    SHOULDER ARTHROSCOPY W/ SUPERIOR LABRAL ANTERIOR POSTERIOR LESION REPAIR Left 12/22/2004   SVT ABLATION N/A 07/23/2019   Procedure: SVT ABLATION;  Surgeon: Evans Lance, MD;  Location: [facility];  Service: Cardiovascular;  Laterality: N/A;   TOTAL HIP ARTHROPLASTY Right 10/10/2021   Procedure: Right TOTAL HIP ARTHROPLASTY ANTERIOR APPROACH;  Surgeon: Mcarthur Rossetti, MD;  Location: [facility];  Service: Orthopedics;  Laterality: Right;   TOTAL KNEE ARTHROPLASTY Right 12/23/2012   x3, 2 arthroscopies and a TKR,  TOTAL KNEE ARTHROPLASTY;  Surgeon: Hessie Dibble, MD;  Location: [facility];  Service: Orthopedics;  Laterality: Right;  DEPUY, RNFA   WRIST SURGERY Right    x 3, torn ligaments, pins placed then infected, then fused with plate  Family History  Problem Relation Age of Onset   COPD Mother        smoker   Hypertension Mother    Heart disease Mother        CHF   Diabetes Father    Heart disease Father        CABG in 22's   Stroke Father    Hypertension Father    Peripheral vascular disease Father    Hypertension Son        tachycardia   Heart disease Maternal Grandfather    Arthritis Paternal Grandmother    Colon cancer Neg Hx    Rectal cancer Neg Hx    Stomach cancer Neg Hx      Social History   Socioeconomic History   Marital status: Single    Spouse name: Not on file   Number of children: 2   Years of education: Not on file   Highest education level: High school graduate  Occupational History   Occupation: Disabled  Tobacco Use   Smoking status: Never   Smokeless tobacco: Current    Types: Snuff  Vaping Use   Vaping Use: Never used  Substance and Sexual Activity   Alcohol use: No   Drug use: No   Sexual activity: Yes  Other Topics Concern   Not on file  Social History Narrative   Lives at home with his father (he takes care of his father)   Right handed   Drinks 2 cups of caffeine daily   Social  Determinants of Health   Financial Resource Strain: Not on file  Food Insecurity: Not on file  Transportation Needs: Not on file  Physical Activity: Not on file  Stress: Not on file  Social Connections: Not on file  Intimate Partner Violence: Not on file     BP 112/70   Pulse 71   Ht 6' (1.829 m)   Wt 218 lb 6.4 oz (99.1 kg)   SpO2 99%   BMI 29.62 kg/m   Physical Exam:  Well appearing NAD HEENT: Unremarkable Neck:  No JVD, no thyromegally Lymphatics:  No adenopathy Back:  No CVA tenderness Lungs:  Clear with no wheezes HEART:  Regular rate rhythm, no murmurs, no rubs, no clicks Abd:  soft, positive bowel sounds, no organomegally, no rebound, no guarding Ext:  2 plus pulses, no edema, no cyanosis, no clubbing Skin:  No rashes no nodules Neuro:  CN II through XII intact, motor grossly intact  EKG - nsr   Assess/Plan:  Recurrent palpitations - he is improved on low dose flecainide. His QRS duration is acceptable. He has an apical LAD lesion but has no angina. He has been fairly sedentary and as he is on low dose flecainide with no significant QRS widening, and has some limit with his hips, I will not recommend an exercise test at this time. CAD - he denies anginal symptoms.  HTN - his bp is a little low and I have asked him to stop the HCTZ and continue lisinopril. Dyslipidemia - he will continue lipitor 40 mg daily.    Carleene Overlie Krishauna Schatzman,MD

## 2022-09-21 NOTE — Patient Instructions (Addendum)
Medication Instructions:  Your physician has recommended you make the following change in your medication:  1) STOP taking Zestoretic (lisinopril-HCTZ)  2) START taking lisinopril 10 mg daily *If you need a refill on your cardiac medications before your next appointment, please call your pharmacy*  Follow-Up: At [facility], you and your health needs are our priority.  As part of our continuing mission to provide you with exceptional heart care, we have created designated Provider Care Teams.  These Care Teams include your primary Cardiologist (physician) and Advanced Practice Providers (APPs -  Physician Assistants and Nurse Practitioners) who all work together to provide you with the care you need, when you need it.  Your next appointment:   1 year(s)  Provider:   You may see [name], MD or one of the following Advanced Practice Providers on your designated Care Team:   Tommye Standard, [place] "Jonni Sanger" [place], [place], NP

## 2022-09-28 ENCOUNTER — Other Ambulatory Visit: Payer: Self-pay | Admitting: Family Medicine

## 2022-09-28 DIAGNOSIS — G2581 Restless legs syndrome: Secondary | ICD-10-CM

## 2022-10-01 ENCOUNTER — Telehealth: Payer: Self-pay

## 2022-10-01 NOTE — Telephone Encounter (Signed)
-----   Message from Damian Leavell, RN sent at 09/27/2022  7:29 AM EST -----  ----- Message ----- From: Evans Lance, MD Sent: 09/26/2022   9:35 PM EST To: Damian Leavell, RN  Normal stress test. No evidence of a  blocked artery in the heart.

## 2022-10-04 ENCOUNTER — Encounter: Payer: Self-pay | Admitting: "Endocrinology

## 2022-10-04 ENCOUNTER — Encounter: Payer: Self-pay | Admitting: Radiology

## 2022-10-05 ENCOUNTER — Other Ambulatory Visit: Payer: Self-pay | Admitting: "Endocrinology

## 2022-10-05 MED ORDER — LANTUS SOLOSTAR 100 UNIT/ML ~~LOC~~ SOPN: 60.0000 [IU] | PEN_INJECTOR | Freq: Every day | SUBCUTANEOUS | 0 refills | Status: DC

## 2022-10-11 ENCOUNTER — Encounter: Payer: Self-pay | Admitting: Radiology

## 2022-10-17 DIAGNOSIS — E118 Type 2 diabetes mellitus with unspecified complications: Secondary | ICD-10-CM | POA: Diagnosis not present

## 2022-10-30 ENCOUNTER — Ambulatory Visit: Payer: Medicare HMO | Admitting: "Endocrinology

## 2022-11-08 ENCOUNTER — Other Ambulatory Visit: Payer: Self-pay | Admitting: Family Medicine

## 2022-11-16 DIAGNOSIS — E118 Type 2 diabetes mellitus with unspecified complications: Secondary | ICD-10-CM | POA: Diagnosis not present

## 2022-11-18 NOTE — Progress Notes (Deleted)
[facility] [address] [place], [place] [address] [phone]  Date:  11/21/2022   Name:  Sean Horn   DOB:  09-22-1960   MRN:  [id number]  PCP:  Pearline Cables, MD    Chief Complaint: No chief complaint on file.   History of Present Illness:  Sean Horn is a 62 y.o. very pleasant male patient who presents with the following:  Patient seen today for follow-up and labs- history of TIA, SVT, end-stage arthritis of knees and hips, diabetes, hyperlipidemia, hypertension, CAD  Most recent visit with myself was in September at which time he had concern of tremors  He takes Tylenol 3 for hip arthritis pain-hip replacement is planned  He was seen by cardiology, Dr. Lewayne Bunting most recently in February: Recurrent palpitations - he is improved on low dose flecainide. His QRS duration is acceptable. He has an apical LAD lesion but has no angina. He has been fairly sedentary and as he is on low dose flecainide with no significant QRS widening, and has some limit with his hips, I will not recommend an exercise test at this time. CAD - he denies anginal symptoms.  HTN - his bp is a little low and I have asked him to stop the HCTZ and continue lisinopril. Dyslipidemia - he will continue lipitor 40 mg daily.  He was also seen by endocrinology most recently in [place] has shown improvement using CBG He returns with significant improvement in his glycemic profile despite the fact that his prandial insulin was discontinued.  His Dexcom CGM is now in use showing 48% time in range, 40% level 1 hyperglycemia, 11% level 2 hyperglycemia.  His point-of-care A1c was 8% during his first visit.  He did not document or report any hypoglycemia.  Recent labs reviewed. - I had a long discussion with him about the possible risk factors and  the pathology behind its diabetes and its complications. -his diabetes is complicated by  obesity's/sedentary life, comorbid disease and he remains at a high risk for more acute and chronic complications which include CAD, CVA, CKD, retinopathy, and neuropathy. These are all discussed in detail with him.  Urine micro can be updated Shingrix Foot exam is due Eye exam Recommend COVID booster if not completed  Lab Results  Component Value Date   TSH 1.430 04/26/2022     Lab Results  Component Value Date   HGBA1C 8.0 (A) 09/05/2022   Lipitor 40 Plavix Lexapro 20 Flecainide 50 twice daily Imdur 30 mg daily Gabapentin 300 3 times daily Requip at bedtime NovoLog 3 times daily Insulin glargine Metformin Ozempic Levothyroxine 25 Lisinopril 10 Toprol-XL 50 Patient Active Problem List   Diagnosis Date Noted   Hypothyroidism 09/05/2022   Unilateral primary osteoarthritis, left hip 01/30/2022   Syncope and collapse 01/22/2022   PSVT (paroxysmal supraventricular tachycardia) 12/12/2021   NSVT (nonsustained ventricular tachycardia) 12/12/2021   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   Unilateral primary osteoarthritis, right hip 06/26/2021   Hyperbilirubinemia 11/15/2020   PONV (postoperative nausea and vomiting)    Type 2 diabetes mellitus without complication, with long-term current use of insulin    Chronic neck pain    Chronic headaches    CAD in native artery    Coronary artery disease involving native coronary artery of native heart without angina pectoris 11/06/2019   Diastolic dysfunction 11/06/2019   SVT (supraventricular tachycardia) 06/26/2019  Non-ST elevation (NSTEMI) myocardial infarction 06/26/2019   Tachycardia 06/25/2019   Hypertriglyceridemia 06/16/2019   Palpitations 06/12/2019   Morning headache 12/02/2018   Insomnia secondary to chronic pain 12/02/2018   Retrognathia 12/02/2018   TIA (transient ischemic attack) 11/03/2018   Cervical stenosis of spine 01/16/2018   Hyperlipidemia 07/19/2017   Obesity 01/30/2017   Urinary  frequency 12/11/2016   Insomnia 09/13/2016   History of chicken pox    History of shingles    Preventative health care 08/14/2013   Anxiety    History of kidney stones    GERD (gastroesophageal reflux disease)    Type 2 diabetes mellitus with complication, with long-term current use of insulin    Essential hypertension, benign    Osteoarthritis    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 cath 06/26/2019 - 80% apical LAD >> medical management    Chronic headaches    Chronic neck pain    Coronary artery disease involving native coronary artery of native heart without angina pectoris 11/06/2019   GERD (gastroesophageal reflux disease)    occasional - OTC as needed   History of chicken pox    History of kidney stones    History of shingles    Hypertension    under control with med., had been on med. since age 26   Insomnia 09/13/2016   Insulin dependent diabetes mellitus    Follows with endocrinology, Dr Chestine Spore reports his last hgba1c was 7.3    Non-insulin dependent type 2 diabetes mellitus    Non-ST elevation (NSTEMI) myocardial infarction 06/26/2019   Obesity 01/30/2017   Occipital neuralgia    Osteoarthritis    right knee   PONV (postoperative nausea and vomiting)    PONV (postoperative nausea and vomiting)    Preventative health care 08/14/2013   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   And left now    SVT (supraventricular tachycardia) 06/26/2019   Tachycardia    TIA (transient ischemic attack) 11/03/2018   Type 2 diabetes mellitus with complication, with long-term current use of insulin    Follows with endocrinology, Dr Chestine Spore reports his last hgba1c was 7.3    Urinary frequency 12/11/2016    Past Surgical History:  Procedure Laterality Date   ANTERIOR CERVICAL DECOMP/DISCECTOMY FUSION N/A 01/16/2018   Procedure: ANTERIOR CERVICAL DECOMPRESSION/DISCECTOMY FUSION CERVICAL FOUR-FIVE, CERVICAL FIVE-SIX;   Surgeon: Lisbeth Renshaw, MD;  Location: [facility] OR;  Service: Neurosurgery;  Laterality: N/A;  ANTERIOR CERVICAL DECOMPRESSION/DISCECTOMY FUSION CERVICAL FOUR-FIVE, CERVICAL FIVE-SIX   CHOLECYSTECTOMY  1980's   CYSTOSCOPY/RETROGRADE/URETEROSCOPY/STONE EXTRACTION WITH BASKET Right 08/27/2003   and double J stent placement   ELECTROPHYSIOLOGY STUDY N/A 07/23/2019   Procedure: ELECTROPHYSIOLOGY STUDY;  Surgeon: Marinus Maw, MD;  Location: [facility] INVASIVE CV LAB;  Service: Cardiovascular;  Laterality: N/A;   INGUINAL HERNIA REPAIR Left 1990's   KNEE ARTHROSCOPY Left 1980's   KNEE ARTHROSCOPY Right 2013   KNEE ARTHROSCOPY Right 06/30/2013   Procedure: RIGHT KNEE ARTHROSCOPY AND SYNOVECTOMY;  Surgeon: Velna Ochs, MD;  Location: [facility];  Service: Orthopedics;  Laterality: Right;   LEFT HEART CATH AND CORONARY ANGIOGRAPHY N/A 06/26/2019   Procedure: LEFT HEART CATH AND CORONARY ANGIOGRAPHY;  Surgeon: Marykay Lex, MD;  Location: [facility] IN[facility] CV LAB;  Service: Cardiovascular;  Laterality: N/A;   LITHOTRIPSY     multiple, b/l   SHOULDER ADHESION RELEASE  Left ` 1998   SHOULDER ARTHROSCOPY W/ ROTATOR CUFF REPAIR Left 10/20/2004   x 4, had to have part of clavile removed. torn rotator cuff multiple times, had to place screws    SHOULDER ARTHROSCOPY W/ SUPERIOR LABRAL ANTERIOR POSTERIOR LESION REPAIR Left 12/22/2004   SVT ABLATION N/A 07/23/2019   Procedure: SVT ABLATION;  Surgeon: Marinus Maw, MD;  Location: [facility];  Service: Cardiovascular;  Laterality: N/A;   TOTAL HIP ARTHROPLASTY Right 10/10/2021   Procedure: Right TOTAL HIP ARTHROPLASTY ANTERIOR APPROACH;  Surgeon: Kathryne Hitch, MD;  Location: [facility] OR;  Service: Orthopedics;  Laterality: Right;   TOTAL KNEE ARTHROPLASTY Right 12/23/2012   x3, 2 arthroscopies and a TKR,  TOTAL KNEE ARTHROPLASTY;  Surgeon: Velna Ochs, MD;  Location: [facility] OR;  Service: Orthopedics;  Laterality: Right;  DEPUY, RNFA    WRIST SURGERY Right    x 3, torn ligaments, pins placed then infected, then fused with plate    Social History   Tobacco Use   Smoking status: Never   Smokeless tobacco: Current    Types: Snuff  Vaping Use   Vaping Use: Never used  Substance Use Topics   Alcohol use: No   Drug use: No    Family History  Problem Relation Age of Onset   COPD Mother        smoker   Hypertension Mother    Heart disease Mother        CHF   Diabetes Father    Heart disease Father        CABG in 45's   Stroke Father    Hypertension Father    Peripheral vascular disease Father    Hypertension Son        tachycardia   Heart disease Maternal Grandfather    Arthritis Paternal Grandmother    Colon cancer Neg Hx    Rectal cancer Neg Hx    Stomach cancer Neg Hx     Allergies  Allergen Reactions   Rosuvastatin Other (See Comments)    Joint pain   Toradol [Ketorolac Tromethamine] Itching    Medication list has been reviewed and updated.  Current Outpatient Medications on File Prior to Visit  Medication Sig Dispense Refill   acetaminophen (TYLENOL) 500 MG tablet Take 1,000-1,500 mg by mouth 2 (two) times daily as needed for moderate pain.     acetaminophen-codeine (TYLENOL #3) 300-30 MG tablet TAKE 1 TABLET BY MOUTH AT BEDTIME. MAY TAKE A SECOND TABLET DURING THE DAY AS NEEDED FOR PAIN 40 tablet 0   atorvastatin (LIPITOR) 40 MG tablet Take 1 tablet (40 mg total) by mouth daily. (Patient taking differently: Take 40 mg by mouth at bedtime.) 90 tablet 3   clopidogrel (PLAVIX) 75 MG tablet Take 1 tablet by mouth once daily with breakfast 90 tablet 3   escitalopram (LEXAPRO) 20 MG tablet Take 1 tablet by mouth once daily 90 tablet 0   flecainide (TAMBOCOR) 50 MG tablet Take 1 tablet (50 mg total) by mouth 2 (two) times daily. 180 tablet 3   gabapentin (NEURONTIN) 300 MG capsule TAKE 1 CAPSULE BY MOUTH THREE TIMES DAILY 90 capsule 5   insulin aspart (NOVOLOG FLEXPEN) 100 UNIT/ML FlexPen Inject 1  Units into the skin 3 (three) times daily with meals. 30 mL 5   insulin glargine (LANTUS SOLOSTAR) 100 UNIT/ML Solostar Pen Inject 60 Units into the skin at bedtime. 30 mL 0   isosorbide mononitrate (IMDUR) 30 MG 24 hr tablet  Take 1 tablet (30 mg total) by mouth daily. Please keep scheduled appointment for further refils 90 tablet 0   levothyroxine (SYNTHROID) 25 MCG tablet TAKE 1 TABLET BY MOUTH ONCE DAILY BEFORE BREAKFAST 30 tablet 0   lisinopril (ZESTRIL) 10 MG tablet Take 1 tablet (10 mg total) by mouth daily. 90 tablet 3   metFORMIN (GLUMETZA) 1000 MG (MOD) 24 hr tablet Take 1 tablet (1,000 mg total) by mouth daily with breakfast. TAKE 2 TABLETS BY MOUTH TWICE DAILY WITH MEALS 180 tablet 1   metoprolol succinate (TOPROL-XL) 50 MG 24 hr tablet Take 1 tablet (50 mg total) by mouth every morning. And take 25 mg in the evening TAKE WITH OR IMMEDIATELY FOLLOWING A MEAL 135 tablet 1   nitroGLYCERIN (NITROSTAT) 0.4 MG SL tablet Place 1 tablet (0.4 mg total) under the tongue every 5 (five) minutes as needed. 25 tablet 12   omeprazole (PRILOSEC) 20 MG capsule Take 20 mg by mouth at bedtime.     rOPINIRole (REQUIP) 0.5 MG tablet TAKE 2 TABLETS BY MOUTH AT BEDTIME 180 tablet 0   Semaglutide,0.25 or 0.5MG /DOS, (OZEMPIC, 0.25 OR 0.5 MG/DOSE,) 2 MG/3ML SOPN Inject 0.5 mg into the skin once a week. 3 mL 2   No current facility-administered medications on file prior to visit.    Review of Systems:  As per HPI- otherwise negative.   Physical Examination: There were no vitals filed for this visit. There were no vitals filed for this visit. There is no height or weight on file to calculate BMI. Ideal Body Weight:    GEN: no acute distress. HEENT: Atraumatic, Normocephalic.  Ears and Nose: No external deformity. CV: RRR, No M/G/R. No JVD. No thrill. No extra heart sounds. PULM: CTA B, no wheezes, crackles, rhonchi. No retractions. No resp. distress. No accessory muscle use. ABD: S, NT, ND, +BS. No  rebound. No HSM. EXTR: No c/c/e PSYCH: Normally interactive. Conversant.  Foot exam  Assessment and Plan: ***  Signed Abbe Amsterdam, MD

## 2022-11-21 ENCOUNTER — Ambulatory Visit: Payer: Medicare HMO | Admitting: Family Medicine

## 2022-11-21 ENCOUNTER — Other Ambulatory Visit: Payer: Self-pay | Admitting: "Endocrinology

## 2022-11-21 DIAGNOSIS — M5410 Radiculopathy, site unspecified: Secondary | ICD-10-CM

## 2022-11-21 DIAGNOSIS — Z125 Encounter for screening for malignant neoplasm of prostate: Secondary | ICD-10-CM

## 2022-11-21 DIAGNOSIS — E782 Mixed hyperlipidemia: Secondary | ICD-10-CM

## 2022-11-21 DIAGNOSIS — I10 Essential (primary) hypertension: Secondary | ICD-10-CM

## 2022-11-21 DIAGNOSIS — E118 Type 2 diabetes mellitus with unspecified complications: Secondary | ICD-10-CM

## 2022-11-21 DIAGNOSIS — E039 Hypothyroidism, unspecified: Secondary | ICD-10-CM

## 2022-11-23 ENCOUNTER — Other Ambulatory Visit: Payer: Self-pay | Admitting: "Endocrinology

## 2022-11-29 ENCOUNTER — Other Ambulatory Visit: Payer: Self-pay

## 2022-11-29 DIAGNOSIS — F419 Anxiety disorder, unspecified: Secondary | ICD-10-CM

## 2022-11-29 MED ORDER — ESCITALOPRAM OXALATE 20 MG PO TABS
20.0000 mg | ORAL_TABLET | Freq: Every day | ORAL | 0 refills | Status: DC
Start: 2022-11-29 — End: 2023-01-21

## 2022-11-30 ENCOUNTER — Other Ambulatory Visit: Payer: Self-pay | Admitting: "Endocrinology

## 2022-11-30 MED ORDER — LANTUS SOLOSTAR 100 UNIT/ML ~~LOC~~ SOPN: 80.0000 [IU] | PEN_INJECTOR | Freq: Every day | SUBCUTANEOUS | 1 refills | Status: DC

## 2022-12-12 DIAGNOSIS — E119 Type 2 diabetes mellitus without complications: Secondary | ICD-10-CM | POA: Diagnosis not present

## 2022-12-12 DIAGNOSIS — Z794 Long term (current) use of insulin: Secondary | ICD-10-CM | POA: Diagnosis not present

## 2022-12-12 DIAGNOSIS — E559 Vitamin D deficiency, unspecified: Secondary | ICD-10-CM | POA: Diagnosis not present

## 2022-12-13 LAB — COMPREHENSIVE METABOLIC PANEL
ALT: 15 IU/L (ref 0–44)
AST: 15 IU/L (ref 0–40)
Albumin/Globulin Ratio: 1.8 (ref 1.2–2.2)
Albumin: 4.1 g/dL (ref 3.9–4.9)
Alkaline Phosphatase: 70 IU/L (ref 44–121)
BUN/Creatinine Ratio: 9 — ABNORMAL LOW (ref 10–24)
BUN: 8 mg/dL (ref 8–27)
Bilirubin Total: 0.9 mg/dL (ref 0.0–1.2)
CO2: 23 mmol/L (ref 20–29)
Calcium: 9.4 mg/dL (ref 8.6–10.2)
Chloride: 101 mmol/L (ref 96–106)
Creatinine, Ser: 0.94 mg/dL (ref 0.76–1.27)
Globulin, Total: 2.3 g/dL (ref 1.5–4.5)
Glucose: 194 mg/dL — ABNORMAL HIGH (ref 70–99)
Potassium: 4.2 mmol/L (ref 3.5–5.2)
Sodium: 137 mmol/L (ref 134–144)
Total Protein: 6.4 g/dL (ref 6.0–8.5)
eGFR: 92 mL/min/{1.73_m2} (ref >59)

## 2022-12-13 LAB — LIPID PANEL
Chol/HDL Ratio: 6.4 ratio — ABNORMAL HIGH (ref 0.0–5.0)
Cholesterol, Total: 179 mg/dL (ref 100–199)
HDL: 28 mg/dL — ABNORMAL LOW (ref >39)
LDL Chol Calc (NIH): 64 mg/dL (ref 0–99)
Triglycerides: 569 mg/dL (ref 0–149)
VLDL Cholesterol Cal: 87 mg/dL — ABNORMAL HIGH (ref 5–40)

## 2022-12-13 LAB — VITAMIN B12: Vitamin B-12: 493 pg/mL (ref 232–1245)

## 2022-12-13 LAB — T4, FREE: Free T4: 1.17 ng/dL (ref 0.82–1.77)

## 2022-12-13 LAB — VITAMIN D 25 HYDROXY (VIT D DEFICIENCY, FRACTURES): Vit D, 25-Hydroxy: 20.7 ng/mL — ABNORMAL LOW (ref 30.0–100.0)

## 2022-12-13 LAB — TSH: TSH: 1.45 u[IU]/mL (ref 0.450–4.500)

## 2022-12-15 ENCOUNTER — Other Ambulatory Visit: Payer: Self-pay | Admitting: Cardiology

## 2022-12-15 DIAGNOSIS — I214 Non-ST elevation (NSTEMI) myocardial infarction: Secondary | ICD-10-CM

## 2022-12-17 ENCOUNTER — Encounter: Payer: Self-pay | Admitting: Cardiology

## 2022-12-17 ENCOUNTER — Ambulatory Visit: Payer: Medicare HMO | Admitting: "Endocrinology

## 2022-12-17 ENCOUNTER — Ambulatory Visit: Payer: Medicare HMO | Attending: Cardiology | Admitting: Cardiology

## 2022-12-17 VITALS — BP 126/72 | HR 82 | Ht 72.0 in | Wt 220.8 lb

## 2022-12-17 DIAGNOSIS — I471 Supraventricular tachycardia, unspecified: Secondary | ICD-10-CM | POA: Diagnosis not present

## 2022-12-17 DIAGNOSIS — E782 Mixed hyperlipidemia: Secondary | ICD-10-CM

## 2022-12-17 DIAGNOSIS — I2510 Atherosclerotic heart disease of native coronary artery without angina pectoris: Secondary | ICD-10-CM | POA: Diagnosis not present

## 2022-12-17 DIAGNOSIS — E119 Type 2 diabetes mellitus without complications: Secondary | ICD-10-CM | POA: Diagnosis not present

## 2022-12-17 DIAGNOSIS — I10 Essential (primary) hypertension: Secondary | ICD-10-CM

## 2022-12-17 DIAGNOSIS — G459 Transient cerebral ischemic attack, unspecified: Secondary | ICD-10-CM

## 2022-12-17 DIAGNOSIS — I4729 Other ventricular tachycardia: Secondary | ICD-10-CM | POA: Diagnosis not present

## 2022-12-17 NOTE — Patient Instructions (Addendum)
Medication Instructions:  Your physician recommends that you continue on your current medications as directed. Please refer to the Current Medication list given to you today.  *If you need a refill on your cardiac medications before your next appointment, please call your pharmacy*   Lab Work: Your physician recommends that you return for lab work in:  HgbA1c If you have labs (blood work) drawn today and your tests are completely normal, you will receive your results only by: MyChart Message (if you have MyChart) OR A paper copy in the mail If you have any lab test that is abnormal or we need to change your treatment, we will call you to review the results.   Testing/Procedures: None   Follow-Up: At [facility], you and your health needs are our priority.  As part of our continuing mission to provide you with exceptional heart care, we have created designated Provider Care Teams.  These Care Teams include your primary Cardiologist (physician) and Advanced Practice Providers (APPs -  Physician Assistants and Nurse Practitioners) who all work together to provide you with the care you need, when you need it.   Your next appointment:   6 month(s)  Provider:   Thomasene Ripple, DO

## 2022-12-17 NOTE — Progress Notes (Signed)
Cardiology Office Note:    Date:  12/17/2022   ID:  Sean Horn, DOB 07/24/1961, MRN [id number]  PCP:  Pearline Cables, MD  Cardiologist:  Thomasene Ripple, DO  Electrophysiologist:  Lewayne Bunting, MD   Referring MD: Pearline Cables, MD   " I am ok"   History of Present Illness:    Sean Horn is a 62 y.o. male with a hx of hypertension, NSTEMI/coronary artery disease status post left heart cath June 26, 2019 with 80% apical LAD lesions which has been stated for medical management due to this lesion being a unsuitable candidate for PCI, NSVT seen on monitor, SVT/AVNRT status post ablation on July 23, 2019.   I did see the patient on March 23, 2020 at that time he reported that he had a syncope episode.  Given this we placed a monitor and the patient.  He did wear the monitor the first 1 for 1 day and 3 hours which showed nonsustained ventricular tachycardia, because this fell out we will send the patient another monitor which he wore for longer duration and it was unremarkable.   I last saw the patient October 2021 at that time he has been shortness of breath I repeated an echocardiogram.  I also increased his beta-blocker given his notable episodes of nonsustained ventricular tachycardia on his ZIO monitor.   I saw the patient on June 19, 2021 at that time he was planning to go nuclear procedure and he was cleared.  His last visit for me was Dec 11, 2021 at that time we increased his beta-blocker to 50 mg in the morning and 25 at night.  I sent him back to our [facility] for patient for consideration of arrhythmics.  He was started on low-dose flecainide and he seems to be doing well with this.    At his last visit he is doing well from a cardiovascular standpoint.  Patient he has continued to do well here for no complaints at this time.  Past Medical History:  Diagnosis Date   Anxiety    CAD in native artery    a. cath 06/26/2019 - 80% apical LAD >> medical  management    Chronic headaches    Chronic neck pain    Coronary artery disease involving native coronary artery of native heart without angina pectoris 11/06/2019   GERD (gastroesophageal reflux disease)    occasional - OTC as needed   History of chicken pox    History of kidney stones    History of shingles    Hypertension    under control with med., had been on med. 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   And left now    SVT (supraventricular tachycardia) 06/26/2019   Tachycardia    TIA (transient ischemic attack) 11/03/2018   Type 2 diabetes mellitus with complication, with long-term current use of insulin (HCC)    Follows with endocrinology, Dr Chestine Spore reports his last hgba1c was 7.3    Urinary  frequency 12/11/2016    Past Surgical History:  Procedure Laterality Date   ANTERIOR CERVICAL DECOMP/DISCECTOMY FUSION N/A 01/16/2018   Procedure: ANTERIOR CERVICAL DECOMPRESSION/DISCECTOMY FUSION CERVICAL FOUR-FIVE, CERVICAL FIVE-SIX;  Surgeon: Lisbeth Renshaw, MD;  Location: [facility] OR;  Service: Neurosurgery;  Laterality: N/A;  ANTERIOR CERVICAL DECOMPRESSION/DISCECTOMY FUSION CERVICAL FOUR-FIVE, CERVICAL FIVE-SIX   CHOLECYSTECTOMY  1980's   CYSTOSCOPY/RETROGRADE/URETEROSCOPY/STONE EXTRACTION WITH BASKET Right 08/27/2003   and double J stent placement   ELECTROPHYSIOLOGY STUDY N/A 07/23/2019   Procedure: ELECTROPHYSIOLOGY STUDY;  Surgeon: Marinus Maw, MD;  Location: [facility] IN[facility]ASIVE [facility];  Service: Cardiovascular;  Laterality: N/A;    INGUINAL HERNIA REPAIR Left 1990's   Horn ARTHROSCOPY Left 1980's   Horn ARTHROSCOPY Right 2013   Horn ARTHROSCOPY Right 06/30/2013   Procedure: RIGHT Horn ARTHROSCOPY AND SYNOVECTOMY;  Surgeon: Velna Ochs, MD;  Location: [facility];  Service: Orthopedics;  Laterality: Right;   LEFT HEART CATH AND CORONARY ANGIOGRAPHY N/A 06/26/2019   Procedure: LEFT HEART CATH AND CORONARY ANGIOGRAPHY;  Surgeon: Marykay Lex, MD;  Location: [facility] INVASIVE CV LAB;  Service: Cardiovascular;  Laterality: N/A;   LITHOTRIPSY     multiple, b/l   SHOULDER ADHESION RELEASE Left ` 1998   SHOULDER ARTHROSCOPY W/ ROTATOR CUFF REPAIR Left 10/20/2004   x 4, had to have part of clavile removed. torn rotator cuff multiple times, had to place screws    SHOULDER ARTHROSCOPY W/ SUPERIOR LABRAL ANTERIOR POSTERIOR LESION REPAIR Left 12/22/2004   SVT ABLATION N/A 07/23/2019   Procedure: SVT ABLATION;  Surgeon: Marinus Maw, MD;  Location: [facility];  Service: Cardiovascular;  Laterality: N/A;   TOTAL HIP ARTHROPLASTY Right 10/10/2021   Procedure: Right TOTAL HIP ARTHROPLASTY ANTERIOR APPROACH;  Surgeon: Kathryne Hitch, MD;  Location: [facility] OR;  Service: Orthopedics;  Laterality: Right;   TOTAL Horn ARTHROPLASTY Right 12/23/2012   x3, 2 arthroscopies and a TKR,  TOTAL Horn ARTHROPLASTY;  Surgeon: Velna Ochs, MD;  Location: [facility] OR;  Service: Orthopedics;  Laterality: Right;  DEPUY, RNFA   WRIST SURGERY Right    x 3, torn ligaments, pins placed then infected, then fused with plate    Current Medications: Current Meds  Medication Sig   acetaminophen (TYLENOL) 500 MG tablet Take 1,000-1,500 mg by mouth 2 (two) times daily as needed for moderate pain.   aspirin EC 325 MG tablet Take by mouth.   atorvastatin (LIPITOR) 40 MG tablet Take 1 tablet (40 mg total) by mouth daily. (Patient taking differently: Take 40 mg by mouth at bedtime.)   carvedilol (COREG) 6.25 MG tablet Take by mouth.    clopidogrel (PLAVIX) 75 MG tablet Take 1 tablet by mouth once daily with breakfast   escitalopram (LEXAPRO) 20 MG tablet Take 1 tablet (20 mg total) by mouth daily.   fenofibrate 160 MG tablet Take by mouth.   flecainide (TAMBOCOR) 50 MG tablet Take 1 tablet (50 mg total) by mouth 2 (two) times daily.   gabapentin (NEURONTIN) 100 MG capsule Take by mouth.   glipiZIDE (GLUCOTROL) 10 MG tablet Take by mouth.   insulin aspart (NOVOLOG FLEXPEN) 100 UNIT/ML FlexPen Inject 1 Units into the skin 3 (three) times daily with meals.   insulin glargine (LANTUS SOLOSTAR) 100 UNIT/ML Solostar Pen Inject 80 Units into the skin at bedtime.   isosorbide mononitrate (IMDUR) 30 MG 24 hr tablet Take 1 tablet (30 mg total) by mouth daily. Please keep scheduled appointment for further refils   lansoprazole (PREVACID)  30 MG capsule Take by mouth.   levothyroxine (SYNTHROID) 25 MCG tablet TAKE 1 TABLET BY MOUTH ONCE DAILY BEFORE BREAKFAST   lisinopril (ZESTRIL) 10 MG tablet Take 1 tablet (10 mg total) by mouth daily.   metFORMIN (GLUMETZA) 1000 MG (MOD) 24 hr tablet Take 1 tablet (1,000 mg total) by mouth daily with breakfast. TAKE 2 TABLETS BY MOUTH TWICE DAILY WITH MEALS   metoprolol succinate (TOPROL-XL) 50 MG 24 hr tablet Take 1 tablet (50 mg total) by mouth every morning. And take 25 mg in the evening TAKE WITH OR IMMEDIATELY FOLLOWING A MEAL   nitroGLYCERIN (NITROSTAT) 0.4 MG SL tablet Place 1 tablet (0.4 mg total) under the tongue every 5 (five) minutes as needed.   omeprazole (PRILOSEC) 20 MG capsule Take 20 mg by mouth at bedtime.   OZEMPIC, 0.25 OR 0.5 MG/DOSE, 2 MG/3ML SOPN INJECT 0.5 MG INTO THE SKIN ONCE A WEEK   rOPINIRole (REQUIP) 0.5 MG tablet TAKE 2 TABLETS BY MOUTH AT BEDTIME     Allergies:   Rosuvastatin and Toradol [ketorolac tromethamine]   Social History   Socioeconomic History   Marital status: Single    Spouse name: Not on file   Number of children: 2   Years of education: Not on file    Highest education level: 12th grade  Occupational History   Occupation: Disabled  Tobacco Use   Smoking status: Never   Smokeless tobacco: Current    Types: Snuff  Vaping Use   Vaping Use: Never used  Substance and Sexual Activity   Alcohol use: No   Drug use: No   Sexual activity: Yes  Other Topics Concern   Not on file  Social History Narrative   Lives at home with his father (he takes care of his father)   Right handed   Drinks 2 cups of caffeine daily   Social Determinants of Health   Financial Resource Strain: Medium Risk (11/16/2022)   Overall Financial Resource Strain (CARDIA)    Difficulty of Paying Living Expenses: Somewhat hard  Food Insecurity: Food Insecurity Present (11/16/2022)   Hunger Vital Sign    Worried About Running Out of Food in the Last Year: Sometimes true    Ran Out of Food in the Last Year: Sometimes true  Transportation Needs: No Transportation Needs (11/16/2022)   PRAPARE - Administrator, Civil Service (Medical): No    Lack of Transportation (Non-Medical): No  Physical Activity: Insufficiently Active (11/16/2022)   Exercise Vital Sign    Days of Exercise per Week: 3 days    Minutes of Exercise per Session: 40 min  Stress: Stress Concern Present (11/16/2022)   [employer] of Occupational Health - Occupational Stress Questionnaire    Feeling of Stress : To some extent  Social Connections: Moderately Isolated (11/16/2022)   Social Connection and Isolation Panel [NHANES]    Frequency of Communication with Friends and Family: More than three times a week    Frequency of Social Gatherings with Friends and Family: More than three times a week    Attends Religious Services: More than 4 times per year    Active Member of [employer] or Organizations: No    Attends Engineer, structural: Not on file    Marital Status: Divorced     Family History: The patient's family history includes Arthritis in his paternal grandmother; COPD in his  mother; Diabetes in his father; Heart disease in his father, maternal grandfather, and mother; Hypertension in his  father, mother, and son; Peripheral vascular disease in his father; Stroke in his father. There is no history of Colon cancer, Rectal cancer, or Stomach cancer.  ROS:   Review of Systems  Constitution: Negative for decreased appetite, fever and weight gain.  HENT: Negative for congestion, ear discharge, hoarse voice and sore throat.   Eyes: Negative for discharge, redness, vision loss in right eye and visual halos.  Cardiovascular: Negative for chest pain, dyspnea on exertion, leg swelling, orthopnea and palpitations.  Respiratory: Negative for cough, hemoptysis, shortness of breath and snoring.   Endocrine: Negative for heat intolerance and polyphagia.  Hematologic/Lymphatic: Negative for bleeding problem. Does not bruise/bleed easily.  Skin: Negative for flushing, nail changes, rash and suspicious lesions.  Musculoskeletal: Negative for arthritis, joint pain, muscle cramps, myalgias, neck pain and stiffness.  Gastrointestinal: Negative for abdominal pain, bowel incontinence, diarrhea and excessive appetite.  Genitourinary: Negative for decreased libido, genital sores and incomplete emptying.  Neurological: Negative for brief paralysis, focal weakness, headaches and loss of balance.  Psychiatric/Behavioral: Negative for altered mental status, depression and suicidal ideas.  Allergic/Immunologic: Negative for HIV exposure and persistent infections.    EKGs/Labs/Other Studies Reviewed:    The following studies were reviewed today:   EKG:  The ekg ordered today demonstrates   Recent Labs: 03/28/2022: Hemoglobin 13.9; Platelets 287 12/12/2022: ALT 15; BUN 8; Creatinine, Ser 0.94; Potassium 4.2; Sodium 137; TSH 1.450  Recent Lipid Panel    Component Value Date/Time   CHOL 179 12/12/2022 1107   TRIG 569 (HH) 12/12/2022 1107   HDL 28 (L) 12/12/2022 1107   CHOLHDL 6.4 (H)  12/12/2022 1107   CHOLHDL 6 06/05/2021 1352   VLDL 51 (H) 06/26/2019 0409   LDLCALC 64 12/12/2022 1107   LDLCALC 68 04/21/2020 1448   LDLDIRECT 55.0 06/05/2021 1352    Physical Exam:    VS:  BP 126/72   Pulse 82   Ht 6' (1.829 m)   Wt 220 lb 12.8 oz (100.2 kg)   SpO2 98%   BMI 29.95 kg/m     Wt Readings from Last 3 Encounters:  12/17/22 220 lb 12.8 oz (100.2 kg)  09/21/22 218 lb 6.4 oz (99.1 kg)  09/18/22 218 lb 6.4 oz (99.1 kg)     GEN: Well nourished, well developed in no acute distress HEENT: Normal NECK: No JVD; No carotid bruits LYMPHATICS: No lymphadenopathy CARDIAC: S1S2 noted,RRR, no murmurs, rubs, gallops RESPIRATORY:  Clear to auscultation without rales, wheezing or rhonchi  ABDOMEN: Soft, non-tender, non-distended, +bowel sounds, no guarding. EXTREMITIES: No edema, No cyanosis, no clubbing MUSCULOSKELETAL:  No deformity  SKIN: Warm and dry NEUROLOGIC:  Alert and oriented x 3, non-focal PSYCHIATRIC:  Normal affect, good insight  ASSESSMENT:    1. Non-insulin dependent type 2 diabetes mellitus (HCC)   2. Essential hypertension, benign   3. TIA (transient ischemic attack)   4. Coronary artery disease involving native coronary artery of native heart without angina pectoris   5. PSVT (paroxysmal supraventricular tachycardia)   6. NSVT (nonsustained ventricular tachycardia) (HCC)   7. Mixed hyperlipidemia     PLAN:    PSVT-this seems to be under control with his current antiarrhythmics and beta-blocker.  No changes here. CAD-no angina.  Hypertension blood pressure is controlled no need to change his medication.  Hyperlipidemia - continue with current statin medication.  Type 2 diabetes being managed by his endocrinologist.  Will get a hemoglobin A1c today.  He has appointment with his endocrinologist on Wednesday.  The  patient is in agreement with the above plan. The patient left the office in stable condition.  The patient will follow up in 6  months.   Medication Adjustments/Labs and Tests Ordered: Current medicines are reviewed at length with the patient today.  Concerns regarding medicines are outlined above.  Orders Placed This Encounter  Procedures   Hemoglobin A1c   No orders of the defined types were placed in this encounter.   Patient Instructions  Medication Instructions:  Your physician recommends that you continue on your current medications as directed. Please refer to the Current Medication list given to you today.  *If you need a refill on your cardiac medications before your next appointment, please call your pharmacy*   Lab Work: Your physician recommends that you return for lab work in:  HgbA1c If you have labs (blood work) drawn today and your tests are completely normal, you will receive your results only by: MyChart Message (if you have MyChart) OR A paper copy in the mail If you have any lab test that is abnormal or we need to change your treatment, we will call you to review the results.   Testing/Procedures: None   Follow-Up: At [facility], you and your health needs are our priority.  As part of our continuing mission to provide you with exceptional heart care, we have created designated Provider Care Teams.  These Care Teams include your primary Cardiologist (physician) and Advanced Practice Providers (APPs -  Physician Assistants and Nurse Practitioners) who all work together to provide you with the care you need, when you need it.   Your next appointment:   6 month(s)  Provider:   Thomasene Ripple, DO    Adopting a Healthy Lifestyle.  Know what a healthy weight is for you (roughly BMI <25) and aim to maintain this   Aim for 7+ servings of fruits and vegetables daily   65-80+ fluid ounces of water or unsweet tea for healthy kidneys   Limit to max 1 drink of alcohol per day; avoid smoking/tobacco   Limit animal fats in diet for cholesterol and heart health - choose grass fed  whenever available   Avoid highly processed foods, and foods high in saturated/trans fats   Aim for low stress - take time to unwind and care for your mental health   Aim for 150 min of moderate intensity exercise weekly for heart health, and weights twice weekly for bone health   Aim for 7-9 hours of sleep daily   When it comes to diets, agreement about the perfect plan isnt easy to find, even among the experts. Experts at the [facility] of [employer] developed an idea known as the Healthy Eating Plate. Just imagine a plate divided into logical, healthy portions.   The emphasis is on diet quality:   Load up on vegetables and fruits - one-half of your plate: Aim for color and variety, and remember that potatoes dont count.   Go for whole grains - one-quarter of your plate: Whole wheat, barley, wheat berries, quinoa, oats, brown rice, and foods made with them. If you want pasta, go with whole wheat pasta.   Protein power - one-quarter of your plate: Fish, chicken, beans, and nuts are all healthy, versatile protein sources. Limit red meat.   The diet, however, does go beyond the plate, offering a few other suggestions.   Use healthy plant oils, such as olive, canola, soy, corn, sunflower and peanut. Check the labels, and avoid partially  hydrogenated oil, which have unhealthy trans fats.   If youre thirsty, drink water. Coffee and tea are good in moderation, but skip sugary drinks and limit milk and dairy products to one or two daily servings.   The type of carbohydrate in the diet is more important than the amount. Some sources of carbohydrates, such as vegetables, fruits, whole grains, and beans-are healthier than others.   Finally, stay active  Signed, Thomasene Ripple, DO  12/17/2022 9:22 AM    [facility]

## 2022-12-18 LAB — HEMOGLOBIN A1C
Est. average glucose Bld gHb Est-mCnc: 157 mg/dL
Hgb A1c MFr Bld: 7.1 % — ABNORMAL HIGH (ref 4.8–5.6)

## 2022-12-19 ENCOUNTER — Other Ambulatory Visit: Payer: Self-pay | Admitting: Cardiology

## 2022-12-19 ENCOUNTER — Encounter: Payer: Self-pay | Admitting: "Endocrinology

## 2022-12-19 ENCOUNTER — Ambulatory Visit: Payer: Medicare HMO | Admitting: "Endocrinology

## 2022-12-19 VITALS — BP 120/86 | HR 76 | Ht 72.0 in | Wt 223.8 lb

## 2022-12-19 DIAGNOSIS — E039 Hypothyroidism, unspecified: Secondary | ICD-10-CM | POA: Diagnosis not present

## 2022-12-19 DIAGNOSIS — Z6830 Body mass index (BMI) 30.0-30.9, adult: Secondary | ICD-10-CM

## 2022-12-19 DIAGNOSIS — I10 Essential (primary) hypertension: Secondary | ICD-10-CM

## 2022-12-19 DIAGNOSIS — Z794 Long term (current) use of insulin: Secondary | ICD-10-CM | POA: Diagnosis not present

## 2022-12-19 DIAGNOSIS — E782 Mixed hyperlipidemia: Secondary | ICD-10-CM

## 2022-12-19 DIAGNOSIS — E119 Type 2 diabetes mellitus without complications: Secondary | ICD-10-CM

## 2022-12-19 DIAGNOSIS — E6609 Other obesity due to excess calories: Secondary | ICD-10-CM

## 2022-12-19 MED ORDER — LANTUS SOLOSTAR 100 UNIT/ML ~~LOC~~ SOPN: 60.0000 [IU] | PEN_INJECTOR | Freq: Every day | SUBCUTANEOUS | 1 refills | Status: DC

## 2022-12-19 MED ORDER — SEMAGLUTIDE (1 MG/DOSE) 4 MG/3ML ~~LOC~~ SOPN
1.0000 mg | PEN_INJECTOR | SUBCUTANEOUS | 2 refills | Status: DC
Start: 2022-12-19 — End: 2023-03-05

## 2022-12-19 NOTE — Patient Instructions (Signed)

## 2022-12-19 NOTE — Progress Notes (Unsigned)
Endocrinology follow-up note       12/19/2022, 5:25 PM   Subjective:    Patient ID: Sean Horn, male    DOB: [birth year].  Sean Horn is being seen in follow-up after he was seen in consultation for management of currently uncontrolled symptomatic diabetes requested by  Copland, Gwenlyn Found, MD.   Past Medical History:  Diagnosis Date   Anxiety    CAD in native artery    a. cath 06/26/2019 - 80% apical LAD >> medical management    Chronic headaches    Chronic neck pain    Coronary artery disease involving native coronary artery of native heart without angina pectoris 11/06/2019   GERD (gastroesophageal reflux disease)    occasional - OTC as needed   History of chicken pox    History of kidney stones    History of shingles    Hypertension    under control with med., had been on med. 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   And left now    SVT (supraventricular tachycardia) 06/26/2019   Tachycardia    TIA (transient ischemic attack) 11/03/2018   Type 2 diabetes mellitus with complication, with long-term current use of insulin (HCC)    Follows with endocrinology, Dr Chestine Spore reports his last hgba1c was 7.3    Urinary frequency 12/11/2016    Past Surgical History:  Procedure Laterality Date   ANTERIOR CERVICAL DECOMP/DISCECTOMY FUSION N/A 01/16/2018   Procedure: ANTERIOR CERVICAL DECOMPRESSION/DISCECTOMY FUSION CERVICAL FOUR-FIVE, CERVICAL FIVE-SIX;   Surgeon: Lisbeth Renshaw, MD;  Location: [facility] OR;  Service: Neurosurgery;  Laterality: N/A;  ANTERIOR CERVICAL DECOMPRESSION/DISCECTOMY FUSION CERVICAL FOUR-FIVE, CERVICAL FIVE-SIX   CHOLECYSTECTOMY  1980's   CYSTOSCOPY/RETROGRADE/URETEROSCOPY/STONE EXTRACTION WITH BASKET Right 08/27/2003   and double J stent placement   ELECTROPHYSIOLOGY STUDY N/A 07/23/2019   Procedure: ELECTROPHYSIOLOGY STUDY;  Surgeon: Marinus Maw, MD;  Location: [facility] INVAS[facility];  Service: Cardiovascular;  Laterality: N/A;   INGUINAL HERNIA REPAIR Left 1990's   KNEE ARTHROSCOPY Left 1980's   KNEE ARTHROSCOPY Right 2013   KNEE ARTHROSCOPY Right 06/30/2013   Procedure: RIGHT KNEE ARTHROSCOPY AND SYNOVECTOMY;  Surgeon: Velna Ochs, MD;  Location:  SURGERY CENTER;  Service: Orthopedics;  Laterality: Right;   LEFT HEART CATH AND CORONARY ANGIOGRAPHY N/A 06/26/2019   Procedure: LEFT HEART CATH AND CORONARY ANGIOGRAPHY;  Surgeon: Marykay Lex, MD;  Location: [facility] INVASIVE CV LAB;  Service: Cardiovascular;  Laterality: N/A;   LITHOTRIPSY     multiple, b/l   SHOULDER ADHESION RELEASE Left ` 1998   SHOULDER ARTHROSCOPY W/ ROTATOR CUFF REPAIR Left 10/20/2004   x 4, had to have part of clavile  removed. torn rotator cuff multiple times, had to place screws    SHOULDER ARTHROSCOPY W/ SUPERIOR LABRAL ANTERIOR POSTERIOR LESION REPAIR Left 12/22/2004   SVT ABLATION N/A 07/23/2019   Procedure: SVT ABLATION;  Surgeon: Marinus Maw, MD;  Location: [facility];  Service: Cardiovascular;  Laterality: N/A;   TOTAL HIP ARTHROPLASTY Right 10/10/2021   Procedure: Right TOTAL HIP ARTHROPLASTY ANTERIOR APPROACH;  Surgeon: Kathryne Hitch, MD;  Location: [facility] OR;  Service: Orthopedics;  Laterality: Right;   TOTAL KNEE ARTHROPLASTY Right 12/23/2012   x3, 2 arthroscopies and a TKR,  TOTAL KNEE ARTHROPLASTY;  Surgeon: Velna Ochs, MD;  Location: [facility] OR;  Service: Orthopedics;  Laterality: Right;  DEPUY, RNFA    WRIST SURGERY Right    x 3, torn ligaments, pins placed then infected, then fused with plate    Social History   Socioeconomic History   Marital status: Single    Spouse name: Not on file   Number of children: 2   Years of education: Not on file   Highest education level: 12th grade  Occupational History   Occupation: Disabled  Tobacco Use   Smoking status: Never   Smokeless tobacco: Current    Types: Snuff  Vaping Use   Vaping Use: Never used  Substance and Sexual Activity   Alcohol use: No   Drug use: No   Sexual activity: Yes  Other Topics Concern   Not on file  Social History Narrative   Lives at home with his father (he takes care of his father)   Right handed   Drinks 2 cups of caffeine daily   Social Determinants of Health   Financial Resource Strain: Medium Risk (11/16/2022)   Overall Financial Resource Strain (CARDIA)    Difficulty of Paying Living Expenses: Somewhat hard  Food Insecurity: Food Insecurity Present (11/16/2022)   Hunger Vital Sign    Worried About Running Out of Food in the Last Year: Sometimes true    Ran Out of Food in the Last Year: Sometimes true  Transportation Needs: No Transportation Needs (11/16/2022)   PRAPARE - Administrator, Civil Service (Medical): No    Lack of Transportation (Non-Medical): No  Physical Activity: Insufficiently Active (11/16/2022)   Exercise Vital Sign    Days of Exercise per Week: 3 days    Minutes of Exercise per Session: 40 min  Stress: Stress Concern Present (11/16/2022)   [employer] of Occupational Health - Occupational Stress Questionnaire    Feeling of Stress : To some extent  Social Connections: Moderately Isolated (11/16/2022)   Social Connection and Isolation Panel [NHANES]    Frequency of Communication with Friends and Family: More than three times a week    Frequency of Social Gatherings with Friends and Family: More than three times a week    Attends Religious Services: More than 4  times per year    Active Member of [employer] or Organizations: No    Attends Engineer, structural: Not on file    Marital Status: Divorced    Family History  Problem Relation Age of Onset   COPD Mother        smoker   Hypertension Mother    Heart disease Mother        CHF   Diabetes Father    Heart disease Father        CABG in 98's   Stroke Father    Hypertension Father    Peripheral vascular disease  Father    Hypertension Son        tachycardia   Heart disease Maternal Grandfather    Arthritis Paternal Grandmother    Colon cancer Neg Hx    Rectal cancer Neg Hx    Stomach cancer Neg Hx     Outpatient Encounter Medications as of 12/19/2022  Medication Sig   Semaglutide, 1 MG/DOSE, 4 MG/3ML SOPN Inject 1 mg as directed once a week.   acetaminophen (TYLENOL) 500 MG tablet Take 1,000-1,500 mg by mouth 2 (two) times daily as needed for moderate pain.   aspirin EC 325 MG tablet Take by mouth.   atorvastatin (LIPITOR) 40 MG tablet Take 1 tablet (40 mg total) by mouth daily. (Patient taking differently: Take 40 mg by mouth at bedtime.)   carvedilol (COREG) 6.25 MG tablet Take by mouth.   clopidogrel (PLAVIX) 75 MG tablet Take 1 tablet by mouth once daily with breakfast   escitalopram (LEXAPRO) 20 MG tablet Take 1 tablet (20 mg total) by mouth daily.   fenofibrate 160 MG tablet Take by mouth.   flecainide (TAMBOCOR) 50 MG tablet Take 1 tablet (50 mg total) by mouth 2 (two) times daily.   gabapentin (NEURONTIN) 100 MG capsule Take by mouth.   insulin glargine (LANTUS SOLOSTAR) 100 UNIT/ML Solostar Pen Inject 60 Units into the skin at bedtime.   isosorbide mononitrate (IMDUR) 30 MG 24 hr tablet TAKE 1 TABLET BY MOUTH ONCE DAILY . PLEASE KEEP SCHEDULED APPOINTMENT FOR FUTURE REFILLS   lansoprazole (PREVACID) 30 MG capsule Take by mouth.   levothyroxine (SYNTHROID) 25 MCG tablet TAKE 1 TABLET BY MOUTH ONCE DAILY BEFORE BREAKFAST   lisinopril (ZESTRIL) 10 MG tablet Take 1 tablet  (10 mg total) by mouth daily.   metFORMIN (GLUMETZA) 1000 MG (MOD) 24 hr tablet Take 1 tablet (1,000 mg total) by mouth daily with breakfast. TAKE 2 TABLETS BY MOUTH TWICE DAILY WITH MEALS   metoprolol succinate (TOPROL-XL) 50 MG 24 hr tablet Take 1 tablet (50 mg total) by mouth every morning. And take 25 mg in the evening TAKE WITH OR IMMEDIATELY FOLLOWING A MEAL   nitroGLYCERIN (NITROSTAT) 0.4 MG SL tablet Place 1 tablet (0.4 mg total) under the tongue every 5 (five) minutes as needed.   omeprazole (PRILOSEC) 20 MG capsule Take 20 mg by mouth at bedtime.   rOPINIRole (REQUIP) 0.5 MG tablet TAKE 2 TABLETS BY MOUTH AT BEDTIME   [DISCONTINUED] glipiZIDE (GLUCOTROL) 10 MG tablet Take by mouth.   [DISCONTINUED] insulin aspart (NOVOLOG FLEXPEN) 100 UNIT/ML FlexPen Inject 1 Units into the skin 3 (three) times daily with meals. (Patient not taking: Reported on 12/19/2022)   [DISCONTINUED] insulin glargine (LANTUS SOLOSTAR) 100 UNIT/ML Solostar Pen Inject 80 Units into the skin at bedtime. (Patient taking differently: Inject 60 Units into the skin at bedtime.)   [DISCONTINUED] OZEMPIC, 0.25 OR 0.5 MG/DOSE, 2 MG/3ML SOPN INJECT 0.5 MG INTO THE SKIN ONCE A WEEK   No facility-administered encounter medications on file as of 12/19/2022.    ALLERGIES: Allergies  Allergen Reactions   Rosuvastatin Other (See Comments)    Joint pain   Toradol [Ketorolac Tromethamine] Itching    VACCINATION STATUS: Immunization History  Administered Date(s) Administered   Influenza,inj,Quad PF,6+ Mos 05/14/2019, 04/22/2020, 06/05/2021, 04/25/2022   Moderna Sars-Covid-2 Vaccination 03/25/2020, 04/22/2020   PNEUMOCOCCAL CONJUGATE-20 06/05/2021   Tdap 11/14/2020    Diabetes He presents for his follow-up diabetic visit. He has type 2 diabetes mellitus. Onset time: He was diagnosed at approximate age  of 50 years. His disease course has been improving. There are no hypoglycemic associated symptoms. Pertinent negatives for  hypoglycemia include no confusion, headaches, pallor or seizures. Associated symptoms include polydipsia and polyuria. Pertinent negatives for diabetes include no chest pain, no fatigue, no polyphagia and no weakness. There are no hypoglycemic complications. Symptoms are improving. There are no diabetic complications. Risk factors for coronary artery disease include family history, dyslipidemia, diabetes mellitus, hypertension, male sex, sedentary lifestyle and tobacco exposure. Current diabetic treatments: Is currently on Levemir 80 units nightly, NovoLog 15 units being, metformin 1 mg p.o. twice daily, Ozempic 0.5 mg subcutaneously weekly. His weight is decreasing steadily. He is following a generally unhealthy diet. When asked about meal planning, he reported none. He has not had a previous visit with a dietitian. He rarely participates in exercise. His home blood glucose trend is decreasing steadily. His breakfast blood glucose range is generally 180-200 mg/dl. His lunch blood glucose range is generally 180-200 mg/dl. His dinner blood glucose range is generally 140-180 mg/dl. His bedtime blood glucose range is generally 140-180 mg/dl. His overall blood glucose range is 180-200 mg/dl. (He returns with significant improvement in his glycemic profile despite the fact that his prandial insulin was discontinued.  His Dexcom CGM is now in use showing 48% time in range, 40% level 1 hyperglycemia, 11% level 2 hyperglycemia.  His point-of-care A1c was 8% during his first visit.  He did not document or report any hypoglycemia.) An ACE inhibitor/angiotensin II receptor blocker is being taken. Eye exam is current.  Hypertension This is a chronic problem. The current episode started more than 1 year ago. The problem is controlled. Pertinent negatives include no chest pain, headaches, neck pain, palpitations or shortness of breath. Risk factors for coronary artery disease include dyslipidemia, diabetes mellitus, male  gender, obesity, sedentary lifestyle and family history. Past treatments include ACE inhibitors.  Hyperlipidemia This is a chronic problem. The current episode started more than 1 year ago. The problem is uncontrolled. Exacerbating diseases include diabetes and obesity. Pertinent negatives include no chest pain, myalgias or shortness of breath. Current antihyperlipidemic treatment includes statins. Risk factors for coronary artery disease include dyslipidemia, diabetes mellitus, hypertension, a sedentary lifestyle, obesity, male sex and family history.     Review of Systems  Constitutional:  Negative for chills, fatigue, fever and unexpected weight change.  HENT:  Negative for dental problem, mouth sores and trouble swallowing.   Eyes:  Negative for visual disturbance.  Respiratory:  Negative for cough, choking, chest tightness, shortness of breath and wheezing.   Cardiovascular:  Negative for chest pain, palpitations and leg swelling.  Gastrointestinal:  Negative for abdominal distention, abdominal pain, constipation, diarrhea, nausea and vomiting.  Endocrine: Positive for polydipsia and polyuria. Negative for polyphagia.  Genitourinary:  Negative for dysuria, flank pain, hematuria and urgency.  Musculoskeletal:  Negative for back pain, gait problem, myalgias and neck pain.  Skin:  Negative for pallor, rash and wound.  Neurological:  Negative for seizures, syncope, weakness, numbness and headaches.  Psychiatric/Behavioral:  Negative for confusion and dysphoric mood.     Objective:       12/19/2022    2:54 PM 12/17/2022    9:05 AM 09/21/2022    9:27 AM  Vitals with BMI  Height 6\' 0"  6\' 0"  6\' 0"   Weight 223 lbs 13 oz 220 lbs 13 oz 218 lbs 6 oz  BMI 30.35 29.94 29.61  Systolic 120 126 409  Diastolic 86 72 70  Pulse 76 82  71    BP 120/86   Pulse 76   Ht 6' (1.829 m)   Wt 223 lb 12.8 oz (101.5 kg)   BMI 30.35 kg/m   Wt Readings from Last 3 Encounters:  12/19/22 223 lb 12.8 oz  (101.5 kg)  12/17/22 220 lb 12.8 oz (100.2 kg)  09/21/22 218 lb 6.4 oz (99.1 kg)         CMP ( most recent) CMP     Component Value Date/Time   NA 137 12/12/2022 1107   K 4.2 12/12/2022 1107   CL 101 12/12/2022 1107   CO2 23 12/12/2022 1107   GLUCOSE 194 (H) 12/12/2022 1107   GLUCOSE 244 (H) 03/28/2022 1430   BUN 8 12/12/2022 1107   CREATININE 0.94 12/12/2022 1107   CREATININE 0.89 04/21/2020 1448   CALCIUM 9.4 12/12/2022 1107   PROT 6.4 12/12/2022 1107   ALBUMIN 4.1 12/12/2022 1107   AST 15 12/12/2022 1107   ALT 15 12/12/2022 1107   ALKPHOS 70 12/12/2022 1107   BILITOT 0.9 12/12/2022 1107   GFRNONAA >60 03/28/2022 1430   GFRAA >60 07/20/2019 0927     Diabetic Labs (most recent): Lab Results  Component Value Date   HGBA1C 7.1 (H) 12/17/2022   HGBA1C 8.0 (A) 09/05/2022   HGBA1C 9.0 (H) 03/28/2022     Lipid Panel ( most recent) Lipid Panel     Component Value Date/Time   CHOL 179 12/12/2022 1107   TRIG 569 (HH) 12/12/2022 1107   HDL 28 (L) 12/12/2022 1107   CHOLHDL 6.4 (H) 12/12/2022 1107   CHOLHDL 6 06/05/2021 1352   VLDL 51 (H) 06/26/2019 0409   LDLCALC 64 12/12/2022 1107   LDLCALC 68 04/21/2020 1448   LDLDIRECT 55.0 06/05/2021 1352   LABVLDL 87 (H) 12/12/2022 1107      Lab Results  Component Value Date   TSH 1.450 12/12/2022   TSH 1.430 04/26/2022   TSH 2.35 06/05/2021   TSH 2.18 11/14/2020   TSH 1.714 06/25/2019   TSH 2.297 06/08/2019   TSH 4.628 (H) 11/03/2018   TSH 1.44 12/11/2016   TSH 1.85 09/13/2016   TSH 3.49 05/26/2015   FREET4 1.17 12/12/2022      Assessment & Plan:   1. Type 2 diabetes mellitus without complication, with long-term current use of insulin (HCC)  - KALED AI has currently uncontrolled symptomatic type 2 DM since  62 years of age. He returns with significant improvement in his glycemic profile despite the fact that his prandial insulin was discontinued.  His Dexcom CGM is now in use showing 48% time in  range, 40% level 1 hyperglycemia, 11% level 2 hyperglycemia.  His point-of-care A1c was 8% during his first visit.  He did not document or report any hypoglycemia.  Recent labs reviewed. - I had a long discussion with him about the possible risk factors and  the pathology behind its diabetes and its complications. -his diabetes is complicated by obesity's/sedentary life, comorbid disease and he remains at a high risk for more acute and chronic complications which include CAD, CVA, CKD, retinopathy, and neuropathy. These are all discussed in detail with him.  - I discussed all available options of managing his diabetes including de-escalation of medications. I have counseled him on Food as Medicine by adopting a Whole Food , Plant Predominant  ( WFPP) nutrition as recommended by [employer] of Lifestyle Medicine. Patient is encouraged to switch to  unprocessed or minimally processed  complex starch, adequate protein  intake (mainly plant source), minimal liquid fat, plenty of fruits, and vegetables. -  he is advised to stick to a routine mealtimes to eat 3 complete meals a day and snack only when necessary ( to snack only to correct hypoglycemia BG <70 day time or <100 at night).  - he acknowledges that there is a room for improvement in his food and drink choices. - Suggestion is made for him to avoid simple carbohydrates  from his diet including Cakes, Sweet Desserts, Ice Cream, Soda (diet and regular), Sweet Tea, Candies, Chips, Cookies, Store Bought Juices, Alcohol , Artificial Sweeteners,  Coffee Creamer, and "Sugar-free" Products, Lemonade. This will help patient to have more stable blood glucose profile and potentially avoid unintended weight gain.  The following Lifestyle Medicine recommendations according to American College of Lifestyle Medicine  [facility]) were discussed and and offered to patient and he  agrees to start the journey:  A. Whole Foods, Plant-Based Nutrition comprising of fruits  and vegetables, plant-based proteins, whole-grain carbohydrates was discussed in detail with the patient.   A list for source of those nutrients were also provided to the patient.  Patient will use only water or unsweetened tea for hydration. B.  The need to stay away from risky substances including alcohol, smoking; obtaining 7 to 9 hours of restorative sleep, at least 150 minutes of moderate intensity exercise weekly, the importance of healthy social connections,  and stress management techniques were discussed. C.  A full color page of  Calorie density of various food groups per pound showing examples of each food groups was provided to the patient.   - he will be scheduled with Norm Salt, RDN, CDE for individualized diabetes education.  - I have approached him with the following individualized plan to manage  his diabetes and patient agrees:   -He did better with less medications.  He is advised to continue Levemir 80 units nightly, stay off of NovoLog for now.  She will continue to benefit from Ozempic 0.5 mg subcutaneously weekly and metformin 1000 mg XR p.o. daily at breakfast.   - he is encouraged to call clinic for blood glucose levels less than 70 or above 300 mg /dl. If he returns with significant postprandial hyperglycemia, he will be reconsidered for prandial insulin. -He has severe hypertriglyceridemia, GLP-1 receptor agonists will be titrated slowly to  lower the risk of pancreatitis.   - Specific targets for  A1c;  LDL, HDL,  and Triglycerides were discussed with the patient.  2) Blood Pressure /Hypertension: His blood pressure is controlled to target.   he is advised to continue his current medications including Zestoretic 10-12.5 mg p.o. daily with breakfast . He has a chance on the prescription on blood pressure medications next visit. 3) Lipids/Hyperlipidemia:   Review of his recent lipid panel showed un controlled triglycerides above 1000 mg per DL, controlled LDL at  68. he  is advised to continue    atorvastatin 40 mg p.o. nightly.  Side effects and precautions discussed with him.  4)  Weight/Diet:  Body mass index is 30.35 kg/m.  -   clearly complicating his diabetes care.   he is  a candidate for weight loss. I discussed with him the fact that loss of 5 - 10% of his  current body weight will have the most impact on his diabetes management.  The above detailed  ACLM recommendations for nutrition, exercise, sleep, social life, avoidance of risky substances, the need for restorative sleep  information will also detailed on discharge instructions.  5) hypothyroidism: The circumstance of his diagnosis are not available to review.  He is currently on low-dose levothyroxine 25 mcg p.o. daily.  He is advised to continue on the same.  - We discussed about the correct intake of his thyroid hormone, on empty stomach at fasting, with water, separated by at least 30 minutes from breakfast and other medications,  and separated by more than 4 hours from calcium, iron, multivitamins, acid reflux medications (PPIs). -Patient is made aware of the fact that thyroid hormone replacement is needed for life, dose to be adjusted by periodic monitoring of thyroid function tests.   6) Chronic Care/Health Maintenance:  -he  IS on ACEI/ARB and Statin medications and  is encouraged to initiate and continue to follow up with Ophthalmology, Dentist,  Podiatrist at least yearly or according to recommendations, and advised to   stay away from smoking. I have recommended yearly flu vaccine and pneumonia vaccine at least every 5 years; moderate intensity exercise for up to 150 minutes weekly; and  sleep for 7- 9 hours a day.  - he is  advised to maintain close follow up with Copland, Gwenlyn Found, MD for primary care needs, as well as his other providers for optimal and coordinated care.   I spent  26  minutes in the care of the patient today including review of labs from CMP, Lipids, Thyroid  Function, Hematology (current and previous including abstractions from other facilities); face-to-face time discussing  his blood glucose readings/logs, discussing hypoglycemia and hyperglycemia episodes and symptoms, medications doses, his options of short and long term treatment based on the latest standards of care / guidelines;  discussion about incorporating lifestyle medicine;  and documenting the encounter. Risk reduction counseling performed per USPSTF guidelines to reduce  obesity and cardiovascular risk factors.     Please refer to Patient Instructions for Blood Glucose Monitoring and Insulin/Medications Dosing Guide"  in media tab for additional information. Please  also refer to " Patient Self Inventory" in the Media  tab for reviewed elements of pertinent patient history.  [name] participated in the discussions, expressed understanding, and voiced agreement with the above plans.  All questions were answered to his satisfaction. he is encouraged to contact clinic should he have any questions or concerns prior to his return visit.    Follow up plan: - Return in about 3 months (around 03/21/2023) for Fasting Labs  in AM B4 8, A1c -NV.  Marquis Lunch, MD [facility] [address] [place], [place] [address] Phone: [phone]  Fax: [phone]    12/19/2022, 5:25 PM  This note was partially dictated with voice recognition software. Similar sounding words can be transcribed inadequately or may not  be corrected upon review.

## 2022-12-20 ENCOUNTER — Encounter: Payer: Self-pay | Admitting: "Endocrinology

## 2022-12-26 ENCOUNTER — Other Ambulatory Visit: Payer: Self-pay | Admitting: Family Medicine

## 2022-12-26 DIAGNOSIS — G2581 Restless legs syndrome: Secondary | ICD-10-CM

## 2022-12-28 ENCOUNTER — Other Ambulatory Visit: Payer: Self-pay | Admitting: "Endocrinology

## 2023-01-01 NOTE — Progress Notes (Unsigned)
[facility] [address] [place], [place] [address] [phone]  Date:  01/03/2023   Name:  Sean Horn   DOB:  1961/07/29   MRN:  [id number]  PCP:  Pearline Cables, MD    Chief Complaint: No chief complaint on file.   History of Present Illness:  Sean Horn is a 62 y.o. very pleasant male patient who presents with the following:  Patient seen today for follow-up Most recent visit with myself was in September at which time he was having concern of tremor history of TIA, SVT, end-stage arthritis of knees and hips, diabetes, hyperlipidemia, hypertension, CAD  At our last visit he was no longer seeing endocrinology, his A1c had increased sharply likely due to running out of Trulicity.  We were able to get him back on Trulicity, he was seen by endocrinology about 3 weeks ago and A1c improved to 7.1%  Seen by cardiology, Dr.Tobb also last month: Sean Horn is a 62 y.o. male with a hx of hypertension, NSTEMI/coronary artery disease status post left heart cath June 26, 2019 with 80% apical LAD lesions which has been stated for medical management due to this lesion being a unsuitable candidate for PCI, NSVT seen on monitor, SVT/AVNRT status post ablation on July 23, 2019. 1. Non-insulin dependent type 2 diabetes mellitus (HCC)   2. Essential hypertension, benign   3. TIA (transient ischemic attack)   4. Coronary artery disease involving native coronary artery of native heart without angina pectoris   5. PSVT (paroxysmal supraventricular tachycardia)   6. NSVT (nonsustained ventricular tachycardia) (HCC)   7. Mixed hyperlipidemia    PLAN:    PSVT-this seems to be under control with his current antiarrhythmics and beta-blocker.  No changes here. CAD-no angina. Hypertension blood pressure is controlled no need to change his medication Hyperlipidemia - continue with current statin medication. Type 2  diabetes being managed by his endocrinologist.  Will get a hemoglobin A1c today.  He has appointment with his endocrinologist on Wednesday.     Urine micro is due Foot exam due Eye exam: Shingrix:   Patient Active Problem List   Diagnosis Date Noted   Hypothyroidism 09/05/2022   Unilateral primary osteoarthritis, left hip 01/30/2022   Syncope and collapse 01/22/2022   PSVT (paroxysmal supraventricular tachycardia) 12/12/2021   NSVT (nonsustained ventricular tachycardia) (HCC) 12/12/2021   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   Unilateral primary osteoarthritis, right hip 06/26/2021   Hyperbilirubinemia 11/15/2020   PONV (postoperative nausea and vomiting)    Type 2 diabetes mellitus without complication, with long-term current use of insulin (HCC)    Chronic neck pain    Chronic headaches    CAD in native artery    Coronary artery disease involving native coronary artery of native heart without angina pectoris 11/06/2019   Diastolic dysfunction 11/06/2019   Non-ST elevation (NSTEMI) myocardial infarction (HCC) 06/26/2019   Tachycardia 06/25/2019   Hypertriglyceridemia 06/16/2019   Palpitations 06/12/2019   Morning headache 12/02/2018   Insomnia secondary to chronic pain 12/02/2018   Retrognathia 12/02/2018   TIA (transient ischemic attack) 11/03/2018   Cervical stenosis of spine 01/16/2018   Hyperlipidemia 07/19/2017   Obesity 01/30/2017   Urinary frequency 12/11/2016   Insomnia 09/13/2016   History of chicken pox    History of shingles    Preventative health care 08/14/2013   Anxiety    History of kidney stones  GERD (gastroesophageal reflux disease)    Type 2 diabetes mellitus with complication, with long-term current use of insulin (HCC)    Essential hypertension, benign    Osteoarthritis    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 cath 06/26/2019 - 80%  apical LAD >> medical management    Chronic headaches    Chronic neck pain    Coronary artery disease involving native coronary artery of native heart without angina pectoris 11/06/2019   GERD (gastroesophageal reflux disease)    occasional - OTC as needed   History of chicken pox    History of kidney stones    History of shingles    Hypertension    under control with med., had been on med. since age 89   Insomnia 09/13/2016   Insulin dependent diabetes mellitus    Follows with endocrinology, Dr Chestine Spore reports his last hgba1c was 7.3    Non-insulin dependent type 2 diabetes mellitus (HCC)    Non-ST elevation (NSTEMI) myocardial infarction (HCC) 06/26/2019   Obesity 01/30/2017   Occipital neuralgia    Osteoarthritis    right knee   PONV (postoperative nausea and vomiting)    PONV (postoperative nausea and vomiting)    Preventative health care 08/14/2013   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   And left now    SVT (supraventricular tachycardia) 06/26/2019   Tachycardia    TIA (transient ischemic attack) 11/03/2018   Type 2 diabetes mellitus with complication, with long-term current use of insulin (HCC)    Follows with endocrinology, Dr Chestine Spore reports his last hgba1c was 7.3    Urinary frequency 12/11/2016    Past Surgical History:  Procedure Laterality Date   ANTERIOR CERVICAL DECOMP/DISCECTOMY FUSION N/A 01/16/2018   Procedure: ANTERIOR CERVICAL DECOMPRESSION/DISCECTOMY FUSION CERVICAL FOUR-FIVE, CERVICAL FIVE-SIX;  Surgeon: Lisbeth Renshaw, MD;  Location: [facility] OR;  Service: Neurosurgery;  Laterality: N/A;  ANTERIOR CERVICAL DECOMPRESSION/DISCECTOMY FUSION CERVICAL FOUR-FIVE, CERVICAL FIVE-SIX   CHOLECYSTECTOMY  1980's   CYSTOSCOPY/RETROGRADE/URETEROSCOPY/STONE EXTRACTION WITH BASKET Right 08/27/2003   and double J stent placement   ELECTROPHYSIOLOGY STUDY N/A 07/23/2019   Procedure: ELECTROPHYSIOLOGY STUDY;  Surgeon: Marinus Maw, MD;  Location: [facility] INVASIVE CV [facility];  Service: Cardiovascular;   Laterality: N/A;   INGUINAL HERNIA REPAIR Left 1990's   KNEE ARTHROSCOPY Left 1980's   KNEE ARTHROSCOPY Right 2013   KNEE ARTHROSCOPY Right 06/30/2013   Procedure: RIGHT KNEE ARTHROSCOPY AND SYNOVECTOMY;  Surgeon: Velna Ochs, MD;  Location: [facility];  Service: Orthopedics;  Laterality: Right;   LEFT HEART CATH AND CORONARY ANGIOGRAPHY N/A 06/26/2019   Procedure: LEFT HEART CATH AND CORONARY ANGIOGRAPHY;  Surgeon: Marykay Lex, MD;  Location: [facility] INVASIVE CV LAB;  Service: Cardiovascular;  Laterality: N/A;   LITHOTRIPSY     multiple, b/l   SHOULDER ADHESION RELEASE Left ` 1998   SHOULDER ARTHROSCOPY W/ ROTATOR CUFF REPAIR Left 10/20/2004   x 4, had to have part of clavile removed. torn rotator cuff multiple times, had to place screws    SHOULDER ARTHROSCOPY W/ SUPERIOR LABRAL ANTERIOR POSTERIOR LESION REPAIR Left 12/22/2004   SVT ABLATION N/A 07/23/2019   Procedure: SVT ABLATION;  Surgeon: Marinus Maw, MD;  Location: [facility];  Service: Cardiovascular;  Laterality: N/A;   TOTAL HIP ARTHROPLASTY Right 10/10/2021   Procedure: Right TOTAL HIP ARTHROPLASTY ANTERIOR APPROACH;  Surgeon:  Kathryne Hitch, MD;  Location: [facility] OR;  Service: Orthopedics;  Laterality: Right;   TOTAL KNEE ARTHROPLASTY Right 12/23/2012   x3, 2 arthroscopies and a TKR,  TOTAL KNEE ARTHROPLASTY;  Surgeon: Velna Ochs, MD;  Location: [facility] OR;  Service: Orthopedics;  Laterality: Right;  DEPUY, RNFA   WRIST SURGERY Right    x 3, torn ligaments, pins placed then infected, then fused with plate    Social History   Tobacco Use   Smoking status: Never   Smokeless tobacco: Current    Types: Snuff  Vaping Use   Vaping Use: Never used  Substance Use Topics   Alcohol use: No   Drug use: No    Family History  Problem Relation Age of Onset   COPD Mother        smoker   Hypertension Mother    Heart disease Mother        CHF   Diabetes Father    Heart disease Father         CABG in 32's   Stroke Father    Hypertension Father    Peripheral vascular disease Father    Hypertension Son        tachycardia   Heart disease Maternal Grandfather    Arthritis Paternal Grandmother    Colon cancer Neg Hx    Rectal cancer Neg Hx    Stomach cancer Neg Hx     Allergies  Allergen Reactions   Rosuvastatin Other (See Comments)    Joint pain   Toradol [Ketorolac Tromethamine] Itching    Medication list has been reviewed and updated.  Current Outpatient Medications on File Prior to Visit  Medication Sig Dispense Refill   acetaminophen (TYLENOL) 500 MG tablet Take 1,000-1,500 mg by mouth 2 (two) times daily as needed for moderate pain.     aspirin EC 325 MG tablet Take by mouth.     atorvastatin (LIPITOR) 40 MG tablet Take 1 tablet (40 mg total) by mouth daily. (Patient taking differently: Take 40 mg by mouth at bedtime.) 90 tablet 3   carvedilol (COREG) 6.25 MG tablet Take by mouth.     clopidogrel (PLAVIX) 75 MG tablet Take 1 tablet by mouth once daily with breakfast 90 tablet 3   escitalopram (LEXAPRO) 20 MG tablet Take 1 tablet (20 mg total) by mouth daily. 30 tablet 0   fenofibrate 160 MG tablet Take by mouth.     flecainide (TAMBOCOR) 50 MG tablet Take 1 tablet (50 mg total) by mouth 2 (two) times daily. 180 tablet 3   gabapentin (NEURONTIN) 100 MG capsule Take by mouth.     insulin glargine (LANTUS SOLOSTAR) 100 UNIT/ML Solostar Pen Inject 60 Units into the skin at bedtime. 30 mL 1   isosorbide mononitrate (IMDUR) 30 MG 24 hr tablet TAKE 1 TABLET BY MOUTH ONCE DAILY . PLEASE KEEP SCHEDULED APPOINTMENT FOR FUTURE REFILLS 90 tablet 0   lansoprazole (PREVACID) 30 MG capsule Take by mouth.     levothyroxine (SYNTHROID) 25 MCG tablet TAKE 1 TABLET BY MOUTH ONCE DAILY BEFORE BREAKFAST 30 tablet 0   lisinopril (ZESTRIL) 10 MG tablet Take 1 tablet (10 mg total) by mouth daily. 90 tablet 3   metFORMIN (GLUMETZA) 1000 MG (MOD) 24 hr tablet Take 1 tablet (1,000 mg total)  by mouth daily with breakfast. TAKE 2 TABLETS BY MOUTH TWICE DAILY WITH MEALS 180 tablet 1   metoprolol succinate (TOPROL-XL) 50 MG 24 hr tablet TAKE 1 TABLET BY MOUTH IN  THE MORNING AND 1/2 (ONE-HALF) IN THE EVENING WITH  OR  IMMEDIATELY  FOLLOWING  A  MEAL. 135 tablet 0   nitroGLYCERIN (NITROSTAT) 0.4 MG SL tablet Place 1 tablet (0.4 mg total) under the tongue every 5 (five) minutes as needed. 25 tablet 12   omeprazole (PRILOSEC) 20 MG capsule Take 20 mg by mouth at bedtime.     rOPINIRole (REQUIP) 0.5 MG tablet Take 2 tablets (1 mg total) by mouth at bedtime. 60 tablet 0   Semaglutide, 1 MG/DOSE, 4 MG/3ML SOPN Inject 1 mg as directed once a week. 3 mL 2   No current facility-administered medications on file prior to visit.    Review of Systems:  As per HPI- otherwise negative.   Physical Examination: There were no vitals filed for this visit. There were no vitals filed for this visit. There is no height or weight on file to calculate BMI. Ideal Body Weight:    GEN: no acute distress. HEENT: Atraumatic, Normocephalic.  Ears and Nose: No external deformity. CV: RRR, No M/G/R. No JVD. No thrill. No extra heart sounds. PULM: CTA B, no wheezes, crackles, rhonchi. No retractions. No resp. distress. No accessory muscle use. ABD: S, NT, ND, +BS. No rebound. No HSM. EXTR: No c/c/e PSYCH: Normally interactive. Conversant.  Foot exam:   Assessment and Plan: ***  Signed Abbe Amsterdam, MD

## 2023-01-01 NOTE — Patient Instructions (Incomplete)
It was good to see you again today Please consider getting the shingles vaccine series at your pharmacy  We will try treating your toenail fungus with terbinafine daily for 90 days; hopefully this will allow heathy nail to grow in  Please let me know if I can do anything to help you Great job with your weight and diabetes control!

## 2023-01-02 ENCOUNTER — Other Ambulatory Visit: Payer: Self-pay | Admitting: Family Medicine

## 2023-01-02 DIAGNOSIS — F419 Anxiety disorder, unspecified: Secondary | ICD-10-CM

## 2023-01-03 ENCOUNTER — Encounter: Payer: Self-pay | Admitting: Family Medicine

## 2023-01-03 ENCOUNTER — Ambulatory Visit (INDEPENDENT_AMBULATORY_CARE_PROVIDER_SITE_OTHER): Payer: Medicare HMO | Admitting: Family Medicine

## 2023-01-03 VITALS — BP 132/80 | HR 86 | Temp 98.2°F | Resp 18 | Ht 72.0 in | Wt 213.4 lb

## 2023-01-03 DIAGNOSIS — Z794 Long term (current) use of insulin: Secondary | ICD-10-CM

## 2023-01-03 DIAGNOSIS — E118 Type 2 diabetes mellitus with unspecified complications: Secondary | ICD-10-CM | POA: Diagnosis not present

## 2023-01-03 DIAGNOSIS — B351 Tinea unguium: Secondary | ICD-10-CM

## 2023-01-03 LAB — MICROALBUMIN / CREATININE URINE RATIO
Creatinine,U: 237.7 mg/dL
Microalb Creat Ratio: 3.1 mg/g (ref 0.0–30.0)
Microalb, Ur: 7.4 mg/dL — ABNORMAL HIGH (ref 0.0–1.9)

## 2023-01-03 MED ORDER — TERBINAFINE HCL 250 MG PO TABS
250.0000 mg | ORAL_TABLET | Freq: Every day | ORAL | 0 refills | Status: DC
Start: 2023-01-03 — End: 2024-01-06

## 2023-01-17 ENCOUNTER — Other Ambulatory Visit: Payer: Self-pay | Admitting: Cardiology

## 2023-01-17 DIAGNOSIS — E782 Mixed hyperlipidemia: Secondary | ICD-10-CM

## 2023-01-20 ENCOUNTER — Other Ambulatory Visit: Payer: Self-pay | Admitting: Family Medicine

## 2023-01-20 DIAGNOSIS — F419 Anxiety disorder, unspecified: Secondary | ICD-10-CM

## 2023-01-20 DIAGNOSIS — G2581 Restless legs syndrome: Secondary | ICD-10-CM

## 2023-02-09 ENCOUNTER — Other Ambulatory Visit: Payer: Self-pay | Admitting: Family Medicine

## 2023-02-22 ENCOUNTER — Other Ambulatory Visit (HOSPITAL_COMMUNITY): Payer: Self-pay

## 2023-02-28 DIAGNOSIS — H524 Presbyopia: Secondary | ICD-10-CM | POA: Diagnosis not present

## 2023-02-28 LAB — HM DIABETES EYE EXAM

## 2023-03-03 ENCOUNTER — Other Ambulatory Visit: Payer: Self-pay | Admitting: Family Medicine

## 2023-03-03 DIAGNOSIS — M5410 Radiculopathy, site unspecified: Secondary | ICD-10-CM

## 2023-03-05 ENCOUNTER — Other Ambulatory Visit: Payer: Self-pay | Admitting: "Endocrinology

## 2023-03-05 ENCOUNTER — Other Ambulatory Visit: Payer: Self-pay | Admitting: Family Medicine

## 2023-03-05 DIAGNOSIS — M5410 Radiculopathy, site unspecified: Secondary | ICD-10-CM

## 2023-03-14 ENCOUNTER — Other Ambulatory Visit: Payer: Self-pay | Admitting: Cardiology

## 2023-03-14 DIAGNOSIS — I214 Non-ST elevation (NSTEMI) myocardial infarction: Secondary | ICD-10-CM

## 2023-03-21 ENCOUNTER — Encounter: Payer: Self-pay | Admitting: Family Medicine

## 2023-03-21 ENCOUNTER — Ambulatory Visit: Payer: Medicare HMO | Admitting: "Endocrinology

## 2023-03-21 DIAGNOSIS — M1711 Unilateral primary osteoarthritis, right knee: Secondary | ICD-10-CM

## 2023-03-21 MED ORDER — ACETAMINOPHEN-CODEINE 300-30 MG PO TABS
1.0000 | ORAL_TABLET | Freq: Three times a day (TID) | ORAL | 1 refills | Status: DC | PRN
Start: 2023-03-21 — End: 2023-05-13

## 2023-03-21 NOTE — Telephone Encounter (Signed)
Please see the MyChart message reply(ies) for my assessment and plan.  The patient gave consent for this Medical Advice Message and is aware that it may result in a bill to their insurance company as well as the possibility that this may result in a co-payment or deductible. They are an established patient, but are not seeking medical advice exclusively about a problem treated during an in person or video visit in the last 7 days. I did not recommend an in person or video visit within 7 days of my reply.  I spent a total of 10 minutes cumulative time within 7 days through MyChart messaging Sean Copland, MD  

## 2023-03-21 NOTE — Telephone Encounter (Signed)
Pt has a dx of OA. Please advise:

## 2023-03-24 ENCOUNTER — Other Ambulatory Visit: Payer: Self-pay | Admitting: "Endocrinology

## 2023-03-26 NOTE — Progress Notes (Unsigned)
[facility] [address] [place], [place] [address] [phone]  Date:  03/28/2023   Name:  Sean Horn   DOB:  Mar 16, 1961   MRN:  [id number]  PCP:  Pearline Cables, MD    Chief Complaint: Pain (Concerns/ questions: none/Eye exam: [facility] - [place]/Foot exam is due)   History of Present Illness:  Sean Horn is a 62 y.o. very pleasant male patient who presents with the following:  Patient seen today to discuss pain- history of TIA, SVT, end-stage arthritis of knees and hips, diabetes, hyperlipidemia, hypertension, CAD  Most recent visit with myself was in May of this year-at that time he was using Ozempic and having some success with weight loss He follows up regularly with cardiology, Dr. Raymondo Band recent visit in May He does have endocrinology care for his diabetes  I have been treating him with Tylenol 3 for joint pain In addition, he uses aspirin, Lipitor, carvedilol, Plavix, Lexapro, fenofibrate, flecainide, gabapentin, Lantus, Imdur, levothyroxine, lisinopril, metformin, Toprol-XL, Requip, Ozempic- 1mg    He had the right hip replaced 8/23 and the right knee about a decade ago The left side is generally ok He has not seen ortho about this - most recent hip surgery done per Dr Magnus Ivan The pain is not in his feet  His very lowest right sided back is also painful- mostly at night  He is not having sciatica per his description- "it just aches" He notes he did well for a while after his hip surgery- however a year or so pain came back and worse the last 6 weeks. He is not aware of any injury   Any sort of physical strain makes it worse   Tylenol with codeine helps for "4-5 hours" - he is generally taking just 1 or 2 daily-generally at bedtime.  He is taking plain Tylenol other times during the day  Wt Readings from Last 3 Encounters:  03/28/23 217 lb 9.6 oz (98.7 kg)  01/03/23 213 lb 6.4 oz (96.8  kg)  12/19/22 223 lb 12.8 oz (101.5 kg)     Patient Active Problem List   Diagnosis Date Noted   Hypothyroidism 09/05/2022   Unilateral primary osteoarthritis, left hip 01/30/2022   Syncope and collapse 01/22/2022   PSVT (paroxysmal supraventricular tachycardia) 12/12/2021   NSVT (nonsustained ventricular tachycardia) (HCC) 12/12/2021   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   Unilateral primary osteoarthritis, right hip 06/26/2021   Hyperbilirubinemia 11/15/2020   PONV (postoperative nausea and vomiting)    Type 2 diabetes mellitus without complication, with long-term current use of insulin (HCC)    Chronic neck pain    Chronic headaches    CAD in native artery    Coronary artery disease involving native coronary artery of native heart without angina pectoris 11/06/2019   Diastolic dysfunction 11/06/2019   Non-ST elevation (NSTEMI) myocardial infarction (HCC) 06/26/2019   Tachycardia 06/25/2019   Hypertriglyceridemia 06/16/2019   Palpitations 06/12/2019   Morning headache 12/02/2018   Insomnia secondary to chronic pain 12/02/2018   Retrognathia 12/02/2018   TIA (transient ischemic attack) 11/03/2018   Cervical stenosis of spine 01/16/2018   Hyperlipidemia 07/19/2017   Obesity 01/30/2017   Urinary frequency 12/11/2016   Insomnia 09/13/2016   History of chicken pox    History of shingles    Preventative health care 08/14/2013   Anxiety    History of kidney stones  GERD (gastroesophageal reflux disease)    Type 2 diabetes mellitus with complication, with long-term current use of insulin (HCC)    Essential hypertension, benign    Osteoarthritis    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 cath 06/26/2019 - 80% apical LAD >> medical management    Chronic headaches    Chronic neck pain    Coronary artery disease involving native coronary artery of native heart without  angina pectoris 11/06/2019   GERD (gastroesophageal reflux disease)    occasional - OTC as needed   History of chicken pox    History of kidney stones    History of shingles    Hypertension    under control with med., had been on med. since age 13   Insomnia 09/13/2016   Insulin dependent diabetes mellitus    Follows with endocrinology, Dr Chestine Spore reports his last hgba1c was 7.3    Non-insulin dependent type 2 diabetes mellitus (HCC)    Non-ST elevation (NSTEMI) myocardial infarction (HCC) 06/26/2019   Obesity 01/30/2017   Occipital neuralgia    Osteoarthritis    right knee   PONV (postoperative nausea and vomiting)    PONV (postoperative nausea and vomiting)    Preventative health care 08/14/2013   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   And left now    SVT (supraventricular tachycardia) 06/26/2019   Tachycardia    TIA (transient ischemic attack) 11/03/2018   Type 2 diabetes mellitus with complication, with long-term current use of insulin (HCC)    Follows with endocrinology, Dr Chestine Spore reports his last hgba1c was 7.3    Urinary frequency 12/11/2016    Past Surgical History:  Procedure Laterality Date   ANTERIOR CERVICAL DECOMP/DISCECTOMY FUSION N/A 01/16/2018   Procedure: ANTERIOR CERVICAL DECOMPRESSION/DISCECTOMY FUSION CERVICAL FOUR-FIVE, CERVICAL FIVE-SIX;  Surgeon: Lisbeth Renshaw, MD;  Location: [facility] OR;  Service: Neurosurgery;  Laterality: N/A;  ANTERIOR CERVICAL DECOMPRESSION/DISCECTOMY FUSION CERVICAL FOUR-FIVE, CERVICAL FIVE-SIX   CHOLECYSTECTOMY  1980's   CYSTOSCOPY/RETROGRADE/URETEROSCOPY/STONE EXTRACTION WITH BASKET Right 08/27/2003   and double J stent placement   ELECTROPHYSIOLOGY STUDY N/A 07/23/2019   Procedure: ELECTROPHYSIOLOGY STUDY;  Surgeon: Marinus Maw, MD;  Location: [facility];  Service: Cardiovascular;  Laterality: N/A;   INGUINAL HERNIA REPAIR Left 1990's   KNEE ARTHROSCOPY Left 1980's   KNEE ARTHROSCOPY Right 2013   KNEE ARTHROSCOPY Right 06/30/2013    Procedure: RIGHT KNEE ARTHROSCOPY AND SYNOVECTOMY;  Surgeon: Velna Ochs, MD;  Location: [facility];  Service: Orthopedics;  Laterality: Right;   LEFT HEART CATH AND CORONARY ANGIOGRAPHY N/A 06/26/2019   Procedure: LEFT HEART CATH AND CORONARY ANGIOGRAPHY;  Surgeon: Marykay Lex, MD;  Location: [facility] INVASIVE CV LAB;  Service: Cardiovascular;  Laterality: N/A;   LITHOTRIPSY     multiple, b/l   SHOULDER ADHESION RELEASE Left ` 1998   SHOULDER ARTHROSCOPY W/ ROTATOR CUFF REPAIR Left 10/20/2004   x 4, had to have part of clavile removed. torn rotator cuff multiple times, had to place screws    SHOULDER ARTHROSCOPY W/ SUPERIOR LABRAL ANTERIOR POSTERIOR LESION REPAIR Left 12/22/2004   SVT ABLATION N/A 07/23/2019   Procedure: SVT ABLATION;  Surgeon: Marinus Maw, MD;  Location: [facility];  Service: Cardiovascular;  Laterality: N/A;   TOTAL HIP ARTHROPLASTY Right 10/10/2021   Procedure: Right TOTAL HIP ARTHROPLASTY ANTERIOR APPROACH;  Surgeon:  Kathryne Hitch, MD;  Location: [facility] OR;  Service: Orthopedics;  Laterality: Right;   TOTAL KNEE ARTHROPLASTY Right 12/23/2012   x3, 2 arthroscopies and a TKR,  TOTAL KNEE ARTHROPLASTY;  Surgeon: Velna Ochs, MD;  Location: [facility] OR;  Service: Orthopedics;  Laterality: Right;  DEPUY, RNFA   WRIST SURGERY Right    x 3, torn ligaments, pins placed then infected, then fused with plate    Social History   Tobacco Use   Smoking status: Never   Smokeless tobacco: Current    Types: Snuff  Vaping Use   Vaping status: Never Used  Substance Use Topics   Alcohol use: No   Drug use: No    Family History  Problem Relation Age of Onset   COPD Mother        smoker   Hypertension Mother    Heart disease Mother        CHF   Diabetes Father    Heart disease Father        CABG in 28's   Stroke Father    Hypertension Father    Peripheral vascular disease Father    Hypertension Son        tachycardia   Heart disease  Maternal Grandfather    Arthritis Paternal Grandmother    Colon cancer Neg Hx    Rectal cancer Neg Hx    Stomach cancer Neg Hx     Allergies  Allergen Reactions   Rosuvastatin Other (See Comments)    Joint pain   Toradol [Ketorolac Tromethamine] Itching    Medication list has been reviewed and updated.  Current Outpatient Medications on File Prior to Visit  Medication Sig Dispense Refill   acetaminophen (TYLENOL) 500 MG tablet Take 1,000-1,500 mg by mouth 2 (two) times daily as needed for moderate pain.     acetaminophen-codeine (TYLENOL #3) 300-30 MG tablet Take 1-2 tablets by mouth every 8 (eight) hours as needed for moderate pain. 30 tablet 1   aspirin EC 325 MG tablet Take by mouth.     atorvastatin (LIPITOR) 40 MG tablet Take 1 tablet (40 mg total) by mouth at bedtime. 90 tablet 2   carvedilol (COREG) 6.25 MG tablet Take by mouth.     clopidogrel (PLAVIX) 75 MG tablet Take 1 tablet by mouth once daily with breakfast 90 tablet 3   escitalopram (LEXAPRO) 20 MG tablet Take 1 tablet (20 mg total) by mouth daily. 90 tablet 1   fenofibrate 160 MG tablet Take by mouth.     flecainide (TAMBOCOR) 50 MG tablet Take 1 tablet (50 mg total) by mouth 2 (two) times daily. 180 tablet 3   insulin glargine (LANTUS SOLOSTAR) 100 UNIT/ML Solostar Pen Inject 60 Units into the skin at bedtime. 30 mL 1   lansoprazole (PREVACID) 30 MG capsule Take by mouth.     levothyroxine (SYNTHROID) 25 MCG tablet TAKE 1 TABLET BY MOUTH ONCE DAILY BEFORE BREAKFAST 90 tablet 1   lisinopril (ZESTRIL) 10 MG tablet Take 1 tablet (10 mg total) by mouth daily. 90 tablet 3   metFORMIN (GLUMETZA) 1000 MG (MOD) 24 hr tablet Take 1 tablet (1,000 mg total) by mouth daily with breakfast. TAKE 2 TABLETS BY MOUTH TWICE DAILY WITH MEALS 180 tablet 1   metoprolol succinate (TOPROL-XL) 50 MG 24 hr tablet TAKE 1 TABLET BY MOUTH IN THE MORNING AND 1/2 (ONE-HALF) IN THE EVENING WITH  OR  IMMEDIATELY  FOLLOWING  A  MEAL. 135 tablet 0  nitroGLYCERIN (NITROSTAT) 0.4 MG SL tablet Place 1 tablet (0.4 mg total) under the tongue every 5 (five) minutes as needed. 25 tablet 12   omeprazole (PRILOSEC) 20 MG capsule Take 20 mg by mouth at bedtime.     rOPINIRole (REQUIP) 0.5 MG tablet Take 2 tablets (1 mg total) by mouth at bedtime. 180 tablet 1   Semaglutide, 1 MG/DOSE, (OZEMPIC, 1 MG/DOSE,) 4 MG/3ML SOPN INJECT 1 MG AS DIRECTED ONCE A WEEK 3 mL 1   terbinafine (LAMISIL) 250 MG tablet Take 1 tablet (250 mg total) by mouth daily. 90 tablet 0   No current facility-administered medications on file prior to visit.    Review of Systems:  As per HPI- otherwise negative.   Physical Examination: Vitals:   03/28/23 1435  BP: 120/74  Pulse: 82  Resp: 18  Temp: 98.1 F (36.7 C)  SpO2: 98%   Vitals:   03/28/23 1435  Weight: 217 lb 9.6 oz (98.7 kg)  Height: 6' (1.829 m)   Body mass index is 29.51 kg/m. Ideal Body Weight: Weight in (lb) to have BMI = 25: 183.9  GEN: no acute distress.  Mildly overweight, looks well HEENT: Atraumatic, Normocephalic.  Ears and Nose: No external deformity. CV: RRR, No M/G/R. No JVD. No thrill. No extra heart sounds. PULM: CTA B, no wheezes, crackles, rhonchi. No retractions. No resp. distress. No accessory muscle use. ABD: S, NT, ND EXTR: No c/c/e PSYCH: Normally interactive. Conversant.  S/p right total knee; postsurgical range of motion, no pain.  Hip range of motion is, he does have some discomfort with flexed abduction. He is maximally tender over right SI joint   Assessment and Plan: Right hip pain - Plan: Ambulatory referral to Orthopedic Surgery, CANCELED: Ambulatory referral to Orthopedic Surgery  Non-ST elevation (NSTEMI) myocardial infarction (HCC) - Plan: isosorbide mononitrate (IMDUR) 30 MG 24 hr tablet  Patient seen today with concern of right hip pain, on exam suspect his SI joint is the bigger problem.  He did have a hip replacement per orthopedics last year, I made a  referral back to orthopedics for him.  We discussed how to manage his pain using the Tylenol 3 he has on hand as well as plan Tylenol Discussed safe acetaminophen limits-4 g maximum per day, to be conservative with stick closer to 3 g maximum  Signed Abbe Amsterdam, MD

## 2023-03-28 ENCOUNTER — Ambulatory Visit (INDEPENDENT_AMBULATORY_CARE_PROVIDER_SITE_OTHER): Payer: Medicare HMO | Admitting: Family Medicine

## 2023-03-28 VITALS — BP 120/74 | HR 82 | Temp 98.1°F | Resp 18 | Ht 72.0 in | Wt 217.6 lb

## 2023-03-28 DIAGNOSIS — M25551 Pain in right hip: Secondary | ICD-10-CM

## 2023-03-28 DIAGNOSIS — I214 Non-ST elevation (NSTEMI) myocardial infarction: Secondary | ICD-10-CM

## 2023-03-28 MED ORDER — ISOSORBIDE MONONITRATE ER 30 MG PO TB24
30.0000 mg | ORAL_TABLET | Freq: Every day | ORAL | 0 refills | Status: DC
Start: 2023-03-28 — End: 2023-08-15

## 2023-03-28 NOTE — Patient Instructions (Addendum)
It is not safe to use more than 4 grams (4,000 milligrams) of tylenol/ acetaminophen in one day (24 hours), as this may increase the risk for serious liver problems  You can use your tylenol with codeine a bit more liberally for pain control-  ok to take 1 in the am and 1-2 at bedtime, can add a dose at mid day if needed Remember this can cause drowsiness!  Be sure to account for 300 mg of acetaminophen in each pill  I will get you set up with Dr Magnus Ivan to check on your hip again!

## 2023-04-01 ENCOUNTER — Ambulatory Visit: Payer: Medicare HMO | Admitting: Family Medicine

## 2023-04-02 ENCOUNTER — Other Ambulatory Visit (INDEPENDENT_AMBULATORY_CARE_PROVIDER_SITE_OTHER): Payer: Medicare HMO

## 2023-04-02 ENCOUNTER — Ambulatory Visit (INDEPENDENT_AMBULATORY_CARE_PROVIDER_SITE_OTHER): Payer: Medicare HMO | Admitting: Orthopaedic Surgery

## 2023-04-02 ENCOUNTER — Other Ambulatory Visit: Payer: Self-pay

## 2023-04-02 DIAGNOSIS — M79604 Pain in right leg: Secondary | ICD-10-CM

## 2023-04-02 DIAGNOSIS — M5441 Lumbago with sciatica, right side: Secondary | ICD-10-CM | POA: Diagnosis not present

## 2023-04-02 MED ORDER — HYDROCODONE-ACETAMINOPHEN 5-325 MG PO TABS: 1.0000 | ORAL_TABLET | Freq: Four times a day (QID) | ORAL | 0 refills | Status: DC | PRN

## 2023-04-02 MED ORDER — TIZANIDINE HCL 4 MG PO TABS: 4.0000 mg | ORAL_TABLET | Freq: Three times a day (TID) | ORAL | 1 refills | Status: DC | PRN

## 2023-04-02 NOTE — Progress Notes (Signed)
The patient comes in today with right back pain and sciatica for about 3 weeks now that is worsening.  He is on Plavix and a diabetic with a hemoglobin A1c of 7.1 so he cannot be on steroids and cannot take anti-inflammatories.  He denies any specific injury but he has pain that is radiating down his leg on that right side.  It starts in the sciatic region.  We replaced his right hip back in 2023.  He denies any groin pain.  He denies any change in bowel bladder function.  On exam he has a positive straight leg raise on the right side.  He looks very uncomfortable walking slightly hunched over dealing with pain on the right side of his lumbar spine and it does radiate again into the sciatic region and down his leg.  His right hip exam is normal in terms of range of motion and stability of the hip replacement itself.  2 views of the lumbar spine shows normal alignment of the lumbar spine.  An AP pelvis and lateral of the right hip shows a well-seated total hip arthroplasty with no complicating features.  Given the severity of the sciatic and the radicular symptoms, a MRI is warranted of his lumbar spine to rule out nerve compression.  I will try some Zanaflex and hydrocodone to help temporize his pain since we cannot put him on anti-inflammatories or any oral steroids.  We will see him back once we have the MRI.  If things acutely worsen and he develops any change in bowel or bladder function he will also let us know immediately.

## 2023-04-06 ENCOUNTER — Other Ambulatory Visit: Payer: Self-pay | Admitting: Family Medicine

## 2023-04-06 DIAGNOSIS — M5410 Radiculopathy, site unspecified: Secondary | ICD-10-CM

## 2023-04-17 ENCOUNTER — Ambulatory Visit (HOSPITAL_COMMUNITY)
Admission: RE | Admit: 2023-04-17 | Discharge: 2023-04-17 | Disposition: A | Discharge status: Home / Self Care | Payer: Medicare HMO | Source: Ambulatory Visit | Attending: Orthopaedic Surgery | Admitting: Orthopaedic Surgery

## 2023-04-17 DIAGNOSIS — M5441 Lumbago with sciatica, right side: Secondary | ICD-10-CM | POA: Diagnosis not present

## 2023-04-17 DIAGNOSIS — M5137 Other intervertebral disc degeneration, lumbosacral region: Secondary | ICD-10-CM | POA: Diagnosis not present

## 2023-04-17 DIAGNOSIS — M47816 Spondylosis without myelopathy or radiculopathy, lumbar region: Secondary | ICD-10-CM | POA: Diagnosis not present

## 2023-04-17 DIAGNOSIS — M5136 Other intervertebral disc degeneration, lumbar region: Secondary | ICD-10-CM | POA: Diagnosis not present

## 2023-04-17 DIAGNOSIS — M545 Low back pain, unspecified: Secondary | ICD-10-CM | POA: Diagnosis not present

## 2023-04-22 ENCOUNTER — Encounter: Payer: Self-pay | Admitting: Orthopaedic Surgery

## 2023-04-22 ENCOUNTER — Ambulatory Visit (INDEPENDENT_AMBULATORY_CARE_PROVIDER_SITE_OTHER): Payer: Medicare HMO | Admitting: Orthopaedic Surgery

## 2023-04-22 DIAGNOSIS — M5441 Lumbago with sciatica, right side: Secondary | ICD-10-CM | POA: Diagnosis not present

## 2023-04-22 NOTE — Progress Notes (Signed)
The patient is a 61 year old gentleman who comes in today to go over a lumbar spine MRI.  He has been having significant right-sided low back pain and radicular symptoms which sometimes occur in both legs.  He is a diabetic and his last hemoglobin A1c is 7.1.  This was lower than what it has been and he still is working on better blood glucose control.  He is not obese either.  The radicular pain is not going to his feet at this point.    On exam a lot of his pain is in the right side of his lower back around the facet joints.  There is a sciatic component to the right side.  I did go over his MRI findings and he does have some subarticular stenosis between L4 and L5 more to the left than the right.  From my standpoint I would like to send him to outpatient physical therapy for any modalities that can help decrease his back pain with his radicular symptoms of the lumbar spine with any modalities per the therapist discretion.  He is agreeable to trying this form of treatment.  We will then see him back in 6 weeks after course of physical therapy as an outpatient.

## 2023-04-23 ENCOUNTER — Other Ambulatory Visit: Payer: Self-pay

## 2023-04-23 DIAGNOSIS — M5441 Lumbago with sciatica, right side: Secondary | ICD-10-CM

## 2023-04-29 ENCOUNTER — Ambulatory Visit: Payer: Medicare HMO | Admitting: Orthopaedic Surgery

## 2023-05-13 ENCOUNTER — Other Ambulatory Visit: Payer: Self-pay

## 2023-05-13 ENCOUNTER — Other Ambulatory Visit: Payer: Self-pay | Admitting: Family Medicine

## 2023-05-13 ENCOUNTER — Ambulatory Visit (HOSPITAL_COMMUNITY): Payer: Medicare HMO | Attending: Orthopaedic Surgery | Admitting: Physical Therapy

## 2023-05-13 ENCOUNTER — Encounter (HOSPITAL_COMMUNITY): Payer: Self-pay | Admitting: Physical Therapy

## 2023-05-13 DIAGNOSIS — M6281 Muscle weakness (generalized): Secondary | ICD-10-CM | POA: Insufficient documentation

## 2023-05-13 DIAGNOSIS — M25552 Pain in left hip: Secondary | ICD-10-CM | POA: Insufficient documentation

## 2023-05-13 DIAGNOSIS — R2689 Other abnormalities of gait and mobility: Secondary | ICD-10-CM | POA: Diagnosis not present

## 2023-05-13 DIAGNOSIS — M5441 Lumbago with sciatica, right side: Secondary | ICD-10-CM | POA: Insufficient documentation

## 2023-05-13 DIAGNOSIS — M25551 Pain in right hip: Secondary | ICD-10-CM | POA: Insufficient documentation

## 2023-05-13 DIAGNOSIS — M1711 Unilateral primary osteoarthritis, right knee: Secondary | ICD-10-CM

## 2023-05-13 DIAGNOSIS — M5417 Radiculopathy, lumbosacral region: Secondary | ICD-10-CM | POA: Diagnosis not present

## 2023-05-13 DIAGNOSIS — M5459 Other low back pain: Secondary | ICD-10-CM | POA: Insufficient documentation

## 2023-05-13 NOTE — Therapy (Unsigned)
OUTPATIENT PHYSICAL THERAPY THORACOLUMBAR EVALUATION   Patient Name: Sean Horn MRN: [id number] DOB:[birth year], 62 y.o., male Today's Date: 05/14/2023  END OF SESSION:  PT End of Session - 05/13/23 1439     Visit Number 1    Number of Visits 6    Date for PT Re-Evaluation 06/25/23    Authorization Type [employer] HMO, no auth, no VL    Progress Note Due on Visit 6    PT Start Time 1436    PT Stop Time 1515    PT Time Calculation (min) 39 min             Past Medical History:  Diagnosis Date   Anxiety    CAD in native artery    a. cath 06/26/2019 - 80% apical LAD >> medical management    Chronic headaches    Chronic neck pain    Coronary artery disease involving native coronary artery of native heart without angina pectoris 11/06/2019   GERD (gastroesophageal reflux disease)    occasional - OTC as needed   History of chicken pox    History of kidney stones    History of shingles    Hypertension    under control with med., had been on med. 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   And left now    SVT (supraventricular tachycardia) (HCC) 06/26/2019   Tachycardia    TIA (transient ischemic attack) 11/03/2018   Type 2 diabetes mellitus with complication, with long-term current use of insulin (HCC)    Follows with endocrinology, Dr Chestine Spore reports his last hgba1c was 7.3    Urinary frequency 12/11/2016   Past Surgical History:  Procedure Laterality Date   ANTERIOR CERVICAL DECOMP/DISCECTOMY FUSION N/A 01/16/2018    Procedure: ANTERIOR CERVICAL DECOMPRESSION/DISCECTOMY FUSION CERVICAL FOUR-FIVE, CERVICAL FIVE-SIX;  Surgeon: Lisbeth Renshaw, MD;  Location: [facility] OR;  Service: Neurosurgery;  Laterality: N/A;  ANTERIOR CERVICAL DECOMPRESSION/DISCECTOMY FUSION CERVICAL FOUR-FIVE, CERVICAL FIVE-SIX   CHOLECYSTECTOMY  1980's   CYSTOSCOPY/RETROGRADE/URETEROSCOPY/STONE EXTRACTION WITH BASKET Right 08/27/2003   and double J stent placement   ELECTROPHYSIOLOGY STUDY N/A 07/23/2019   Procedure: ELECTROPHYSIOLOGY STUDY;  Surgeon: Marinus Maw, MD;  Location: [facility];  Service: Cardiovascular;  Laterality: N/A;   INGUINAL HERNIA REPAIR Left 1990's   KNEE ARTHROSCOPY Left 1980's   KNEE ARTHROSCOPY Right 2013   KNEE ARTHROSCOPY Right 06/30/2013   Procedure: RIGHT KNEE ARTHROSCOPY AND SYNOVECTOMY;  Surgeon: Velna Ochs, MD;  Location: [facility];  Service: Orthopedics;  Laterality: Right;   LEFT HEART CATH AND CORONARY ANGIOGRAPHY N/A 06/26/2019   Procedure: LEFT HEART CATH AND CORONARY ANGIOGRAPHY;  Surgeon: Marykay Lex, MD;  Location: [facility] INVASIVE CV LAB;  Service: Cardiovascular;  Laterality: N/A;   LITHOTRIPSY     multiple, b/l   SHOULDER ADHESION RELEASE Left ` 1998   SHOULDER ARTHROSCOPY W/ ROTATOR CUFF REPAIR Left 10/20/2004   x 4, had to have part of clavile removed. torn rotator cuff multiple times,  had to place screws    SHOULDER ARTHROSCOPY W/ SUPERIOR LABRAL ANTERIOR POSTERIOR LESION REPAIR Left 12/22/2004   SVT ABLATION N/A 07/23/2019   Procedure: SVT ABLATION;  Surgeon: Marinus Maw, MD;  Location: [facility] INVASIVE CV LAB;  Service: Cardiovascular;  Laterality: N/A;   TOTAL HIP ARTHROPLASTY Right 10/10/2021   Procedure: Right TOTAL HIP ARTHROPLASTY ANTERIOR APPROACH;  Surgeon: Kathryne Hitch, MD;  Location: [facility] OR;  Service: Orthopedics;  Laterality: Right;   TOTAL KNEE ARTHROPLASTY Right 12/23/2012   x3, 2 arthroscopies and a TKR,  TOTAL KNEE ARTHROPLASTY;  Surgeon:  Velna Ochs, MD;  Location: [facility] OR;  Service: Orthopedics;  Laterality: Right;  DEPUY, RNFA   WRIST SURGERY Right    x 3, torn ligaments, pins placed then infected, then fused with plate   Patient Active Problem List   Diagnosis Date Noted   Hypothyroidism 09/05/2022   Unilateral primary osteoarthritis, left hip 01/30/2022   Syncope and collapse 01/22/2022   PSVT (paroxysmal supraventricular tachycardia) (HCC) 12/12/2021   NSVT (nonsustained ventricular tachycardia) (HCC) 12/12/2021   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   Unilateral primary osteoarthritis, right hip 06/26/2021   Hyperbilirubinemia 11/15/2020   PONV (postoperative nausea and vomiting)    Type 2 diabetes mellitus without complication, with long-term current use of insulin (HCC)    Chronic neck pain    Chronic headaches    CAD in native artery    Coronary artery disease involving native coronary artery of native heart without angina pectoris 11/06/2019   Diastolic dysfunction 11/06/2019   Non-ST elevation (NSTEMI) myocardial infarction (HCC) 06/26/2019   Tachycardia 06/25/2019   Hypertriglyceridemia 06/16/2019   Palpitations 06/12/2019   Morning headache 12/02/2018   Insomnia secondary to chronic pain 12/02/2018   Retrognathia 12/02/2018   TIA (transient ischemic attack) 11/03/2018   Cervical stenosis of spine 01/16/2018   Hyperlipidemia 07/19/2017   Obesity 01/30/2017   Urinary frequency 12/11/2016   Insomnia 09/13/2016   History of chicken pox    History of shingles    Preventative health care 08/14/2013   Anxiety    History of kidney stones    GERD (gastroesophageal reflux disease)    Type 2 diabetes mellitus with complication, with long-term current use of insulin (HCC)    Essential hypertension, benign    Osteoarthritis    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 If he gets up and sits with his knees bent it helps. It wakes him up at night (pt sleeps in prone). Pt reports it starts in the R hip/back which radiates down his leg and now starting to go around the back to the other side/hip. Has tried exercises and muscle relaxers but has only found one stretch that seems to help.   PERTINENT HISTORY:  R hip and knee replacement  PAIN:  Are you having pain? Yes: NPRS scale: at worst 10, 1 currently/10 Pain location: R low back into R LE (radiates R lateral hip to front of thigh) Pain description: Dull, aching Aggravating factors: Reclining with legs straight, lifting leg up out of the shower (feels unsteady), standing long periods Relieving factors: Standing stretch (R foot internally rotated while coming into hip flexor stretch), walking  PRECAUTIONS: None  RED FLAGS: None   WEIGHT BEARING RESTRICTIONS: No  FALLS:  Has patient fallen in last 6 months? No  LIVING ENVIRONMENT: Lives with: lives alone Lives in: House/apartment Stairs: Yes: External: 3-4 steps; none Has following equipment at home: None  OCCUPATION: Retired; Architect and fishing  PLOF: Independent  PATIENT GOALS: Improve pain  NEXT MD VISIT: n/a  OBJECTIVE:  Note: Objective measures were completed at Evaluation unless otherwise noted.  DIAGNOSTIC  FINDINGS:  MRI 04/17/23 IMPRESSION: 1. Mild multilevel lumbar spondylosis, most pronounced at L4-5 where there is moderate left and mild right subarticular recess stenosis and mild-moderate left and mild right foraminal stenosis. Minimal interval progression at this level. 2. No canal stenosis at any level.  PATIENT SURVEYS:  FOTO 40; predicted 3  SCREENING FOR RED FLAGS: Bowel or bladder incontinence: No Spinal tumors: No Cauda equina syndrome: No Compression fracture: No Abdominal aneurysm: No  COGNITION: Overall cognitive status: Within functional limits for tasks assessed     SENSATION: WFL  MUSCLE LENGTH: Hamstrings: Right 80 deg; Left 80 deg Thomas test: Right 5 deg; Left -5 deg  POSTURE: anterior pelvic tilt  PALPATION: TTP bilat TFL and iliopsoas  LUMBAR ROM: WNL  AROM eval  Flexion   Extension   Right lateral flexion   Left lateral flexion   Right rotation   Left rotation    (Blank rows = not tested)  LOWER EXTREMITY ROM:     Active  Right eval Left eval  Hip flexion    Hip extension    Hip abduction    Hip adduction    Hip internal rotation    Hip external rotation    Knee flexion    Knee extension    Ankle dorsiflexion    Ankle plantarflexion    Ankle inversion    Ankle eversion     (Blank rows = not tested)  LOWER EXTREMITY MMT:    MMT Right eval Left eval  Hip flexion 3+ 5  Hip extension 3+ 3+  Hip abduction 3+ 3+  Hip adduction    Hip internal rotation    Hip external rotation    Knee flexion 5 5  Knee extension 5 5  Ankle dorsiflexion    Ankle plantarflexion    Ankle inversion    Ankle eversion     (Blank rows = not tested)  LUMBAR SPECIAL TESTS:  Straight leg raise test: Negative, Slump test: Negative, FABER test: Positive, and Thomas test: Negative  FUNCTIONAL TESTS:  SLS: To be assessed Plank: To be assessed  GAIT: Distance walked: Into clinic Assistive device utilized: None Level of assistance: Complete  Independence Comments: Reciprocal pattern  TODAY'S TREATMENT:                                                                                                                              DATE: 05/13/23 See HEP below  PATIENT EDUCATION:  Education details: Exam findings, POC, initial HEP Person educated: Patient Education method: Explanation, Demonstration, and Handouts Education comprehension: verbalized understanding, returned demonstration, and needs further education  HOME EXERCISE PROGRAM: Access Code: [email or web address] URL: [email or web address] Date: 05/13/2023 Prepared by: Vernon Prey April Kirstie Peri  Exercises - Seated Hip Flexor Stretch  - 1 x daily - 7 x weekly - 2 sets - 30 hold - Prone SIJ Anterior Rotation Mobilization on Table  - 1 x daily - 7 x weekly - 2 sets - 30 sec hold - Hip Abduction with Resistance Loop  - 1 x daily - 7 x weekly - 3 sets - 10 reps - Hip Extension with Resistance Loop  - 1 x daily - 7 x weekly - 3 sets - 10 reps - Marching with Resistance  - 1 x daily - 7 x weekly - 3 sets - 10 reps   ASSESSMENT:  CLINICAL IMPRESSION: Patient is a 62 y.o. M who was seen today for physical therapy evaluation and treatment for low back pain with radiating symptoms into bilat LEs. PMH significant for R hip and knee replacement. On assessment, pt demonstrated gross bilat hip weakness, decreased stability, with tenderness and tightness prevalent in bilat hip flexors (iliopsoas and TFLs) likely leading to femoral nerve impingement limiting tolerance to prolonged sitting in his recliner, standing and sleeping. Pt may benefit from trial of TPDN to iliopsoas and TFL. Pt will benefit from PT to address these issues to return to all his normal activities.   OBJECTIVE IMPAIRMENTS: decreased activity tolerance, decreased balance, decreased endurance, decreased mobility, decreased ROM, decreased strength, increased fascial restrictions, increased muscle spasms, improper body  mechanics, postural dysfunction, and pain.   ACTIVITY LIMITATIONS: sitting, standing, sleeping, and transfers  PARTICIPATION LIMITATIONS: meal prep, cleaning, and community activity  PERSONAL FACTORS: Fitness, Past/current experiences, and Time since onset of injury/illness/exacerbation are also affecting patient's functional outcome.   REHAB POTENTIAL: Good  CLINICAL DECISION MAKING: Evolving/moderate complexity  EVALUATION COMPLEXITY: Moderate   GOALS: Goals reviewed with patient? Yes  SHORT TERM GOALS: Target date: 06/04/2023    Pt will be ind with initial HEP Baseline: Goal status: INITIAL  2.  Pt will verbalize understanding of body positioning for sleep and sitting in recliner to diminish his symptoms by >/=50%  Baseline:  Goal status: INITIAL   LONG TERM GOALS: Target date: 06/25/2023   Pt will be ind with management and progression of HEP Baseline:  Goal status: INITIAL  2.  Pt will have improved MMT to >/=4+/5 to demo increased hip stability Baseline:  Goal status: INITIAL  3.  Pt will  be able to improve SLS by 10 sec more than baseline to demo improved balance and stability for shower transfers Baseline:  Goal status: INITIAL  4.  Pt will have improved FOTO to >/=61 Baseline:  Goal status: INITIAL  5.  Pt will be able to plank for at least 45 sec to be within his age norms Baseline:  Goal status: INITIAL  6.  Pt will report >/=50% resolution of his symptoms Baseline:  Goal status: INITIAL  PLAN:  PT FREQUENCY: 1x/week  PT DURATION: 6 weeks  PLANNED INTERVENTIONS: Therapeutic exercises, Therapeutic activity, Neuromuscular re-education, Balance training, Gait training, Patient/Family education, Self Care, Joint mobilization, Stair training, Dry Needling, Electrical stimulation, Spinal mobilization, Cryotherapy, Taping, Traction, Ionotophoresis 4mg /ml Dexamethasone, Manual therapy, and Re-evaluation.  PLAN FOR NEXT SESSION: Test plank and SLS.  Continue to work on hip strength and mobility with focus on stretching hip flexors. TPDN/manual work as indicated for hip flexors.    Christeen Lai April Ma L Lew Prout, PT 05/14/2023, 8:18 AM

## 2023-05-21 ENCOUNTER — Encounter (HOSPITAL_COMMUNITY): Payer: Medicare HMO

## 2023-05-27 ENCOUNTER — Encounter (HOSPITAL_COMMUNITY): Payer: Medicare HMO

## 2023-05-28 ENCOUNTER — Telehealth (HOSPITAL_COMMUNITY): Payer: Self-pay

## 2023-05-28 NOTE — Telephone Encounter (Signed)
Pt did not arrive for scheduled appointment. No telephone call nor message preceeded this absence. Author attempted to contact pt via telephone number listed in chart. Left a secure VM for patient, asked to called the clinic back.    Rosamaria Lints, PT Physical Therapist [facility] [place]  [phone]

## 2023-06-03 ENCOUNTER — Ambulatory Visit (HOSPITAL_COMMUNITY): Payer: Medicare HMO

## 2023-06-03 ENCOUNTER — Ambulatory Visit: Payer: Medicare HMO | Admitting: Orthopaedic Surgery

## 2023-06-03 ENCOUNTER — Telehealth (HOSPITAL_COMMUNITY): Payer: Self-pay

## 2023-06-03 NOTE — Telephone Encounter (Signed)
Pt did not arrive for scheduled appointment. No telephone call nor message preceeded this absence. Author attempted to contact pt via telephone number listed in chart. Secure, HIPAA compliant VM left. This is 2nd consecutive no-show for patient, he will be DC from PT at this time as he has not returned since his initial evaluation 21 days prior.   Rosamaria Lints, PT Physical Therapist [facility] [place]  [phone]

## 2023-06-03 NOTE — Therapy (Signed)
Now discharged from PT- see prior notes.   Rosamaria Lints, PT Physical Therapist [facility] [place]  [phone]

## 2023-06-12 ENCOUNTER — Other Ambulatory Visit: Payer: Self-pay | Admitting: "Endocrinology

## 2023-06-12 ENCOUNTER — Other Ambulatory Visit: Payer: Self-pay | Admitting: Family Medicine

## 2023-06-12 ENCOUNTER — Other Ambulatory Visit: Payer: Self-pay | Admitting: Cardiology

## 2023-06-12 DIAGNOSIS — F419 Anxiety disorder, unspecified: Secondary | ICD-10-CM

## 2023-06-12 DIAGNOSIS — G2581 Restless legs syndrome: Secondary | ICD-10-CM

## 2023-06-17 ENCOUNTER — Telehealth: Payer: Self-pay

## 2023-06-17 NOTE — Telephone Encounter (Signed)
Initial Comment Caller states the pharmacy has been trying to reach a different office to fill their Rx. Caller states they have one dose left, they take 1 pill a day for acid reflux. Translation No Disp. Time Lamount Cohen Time) Disposition Final User 06/14/2023 5:48:37 PM Send To Nurse Maudry Mayhew, RN, Santina Evans 06/14/2023 6:05:22 PM Attempt made - message left Luana Shu, RN, Carlee 06/14/2023 6:20:34 PM Attempt made - message left Luana Shu, RN, Carlee 06/14/2023 6:38:54 PM FINAL ATTEMPT MADE - message left Yes Luana Shu RN, Carlee Final Disposition 06/14/2023 6:38:54 PM FINAL ATTEMPT MADE - message left Yes Luana Shu, RN, Carlee Comments User: Ella Bodo, RN Date/Time Lamount Cohen Time): 06/14/2023 6:05:06 PM Primary phone number, when asked to speak with Leonette Most, person on line states their name is Loraine Leriche and that nurse has wrong number. Secondary no answer but left VM

## 2023-06-19 ENCOUNTER — Ambulatory Visit: Payer: Medicare HMO | Attending: Cardiology | Admitting: Cardiology

## 2023-06-20 DIAGNOSIS — E039 Hypothyroidism, unspecified: Secondary | ICD-10-CM | POA: Diagnosis not present

## 2023-06-20 DIAGNOSIS — I255 Ischemic cardiomyopathy: Secondary | ICD-10-CM | POA: Diagnosis not present

## 2023-06-20 DIAGNOSIS — I2510 Atherosclerotic heart disease of native coronary artery without angina pectoris: Secondary | ICD-10-CM | POA: Diagnosis not present

## 2023-06-20 DIAGNOSIS — M1990 Unspecified osteoarthritis, unspecified site: Secondary | ICD-10-CM | POA: Diagnosis not present

## 2023-06-20 DIAGNOSIS — G2581 Restless legs syndrome: Secondary | ICD-10-CM | POA: Diagnosis not present

## 2023-06-20 DIAGNOSIS — M5440 Lumbago with sciatica, unspecified side: Secondary | ICD-10-CM | POA: Diagnosis not present

## 2023-06-20 DIAGNOSIS — K219 Gastro-esophageal reflux disease without esophagitis: Secondary | ICD-10-CM | POA: Diagnosis not present

## 2023-06-20 DIAGNOSIS — F419 Anxiety disorder, unspecified: Secondary | ICD-10-CM | POA: Diagnosis not present

## 2023-06-20 DIAGNOSIS — I252 Old myocardial infarction: Secondary | ICD-10-CM | POA: Diagnosis not present

## 2023-06-20 DIAGNOSIS — M4800 Spinal stenosis, site unspecified: Secondary | ICD-10-CM | POA: Diagnosis not present

## 2023-06-20 DIAGNOSIS — I471 Supraventricular tachycardia, unspecified: Secondary | ICD-10-CM | POA: Diagnosis not present

## 2023-06-20 DIAGNOSIS — E785 Hyperlipidemia, unspecified: Secondary | ICD-10-CM | POA: Diagnosis not present

## 2023-06-21 ENCOUNTER — Other Ambulatory Visit: Payer: Self-pay

## 2023-06-21 MED ORDER — OMEPRAZOLE 20 MG PO CPDR: 20.0000 mg | DELAYED_RELEASE_CAPSULE | Freq: Every day | ORAL | 2 refills | Status: DC

## 2023-06-21 NOTE — Telephone Encounter (Signed)
Rx refilled.

## 2023-06-25 ENCOUNTER — Other Ambulatory Visit: Payer: Self-pay | Admitting: Family Medicine

## 2023-06-25 ENCOUNTER — Other Ambulatory Visit: Payer: Self-pay | Admitting: "Endocrinology

## 2023-06-25 DIAGNOSIS — M1711 Unilateral primary osteoarthritis, right knee: Secondary | ICD-10-CM

## 2023-07-05 NOTE — Patient Instructions (Incomplete)
It was good to see you again today It looks like you may be due to follow-up with endocrinology and cardiology-please make this appointment ASAP  I will be in touch with your lab work, assuming all is well please see me in about 6 months

## 2023-07-05 NOTE — Progress Notes (Unsigned)
[facility] [address], [address] [place], [place] [address] [phone]  Date:  07/08/2023   Name:  Sean Horn   DOB:  [birth year]   MRN:  [id number]  PCP:  Pearline Cables, MD    Chief Complaint: No chief complaint on file.   History of Present Illness:  Sean Horn is a 62 y.o. very pleasant male patient who presents with the following:  Patient seen today for periodic follow-up Most recent visit with myself was in August I have been managing his joint pain with Tylenol 3 history of TIA, SVT, end-stage arthritis of knees and hips, diabetes, hyperlipidemia, hypertension, CAD   Cardiologist is Dr. Servando Salina End[facility] manages his diabetes  Per my previous notes regarding his joint pain: He had the right hip replaced 8/23 and the right knee about a decade ago The left side is generally ok He has not seen ortho about this - most recent hip surgery done per Dr Magnus Ivan The pain is not in his feet  His very lowest right sided back is also painful- mostly at night  He is not having sciatica per his description- "it just aches" He notes he did well for a while after his hip surgery- however a year or so pain came back and worse the last 6 weeks. He is not aware of any injury  Any sort of physical strain makes it worse  Tylenol with codeine helps for "4-5 hours" - he is generally taking just 1 or 2 daily-generally at bedtime.  He is taking plain Tylenol other times during the day  He saw Dr. Magnus Ivan with orthopedics in the interim, most recent visit in September-this was discussed lumbar spine MRI.  They noted subarticular stenosis L4-L5, recommended PT  It looks like his most recent endocrinology appointment was in May-using Dexcom.  At that time doing well with Lantus 60 and Ozempic as well as metformin Most recent cardiology visit was also in May  Flu vaccine COVID booster Shingrix Due for Cologuard  update Due for foot exam  Tylenol 3 Aspirin Atorvastatin 40 Fenofibrate 160 Carvedilol 6.25 Plavix Flecainide Imdur 30 Levothyroxine 25 Lisinopril 10 Metformin XR 2000 daily Toprol XR 50 mg a.m., 25mg PM Lantus 60 units Ozempic 1 mg    Patient Active Problem List   Diagnosis Date Noted   Hypothyroidism 09/05/2022   Unilateral primary osteoarthritis, left hip 01/30/2022   Syncope and collapse 01/22/2022   PSVT (paroxysmal supraventricular tachycardia) (HCC) 12/12/2021   NSVT (nonsustained ventricular tachycardia) (HCC) 12/12/2021   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   Unilateral primary osteoarthritis, right hip 06/26/2021   Hyperbilirubinemia 11/15/2020   PONV (postoperative nausea and vomiting)    Type 2 diabetes mellitus without complication, with long-term current use of insulin (HCC)    Chronic neck pain    Chronic headaches    CAD in native artery    Coronary artery disease involving native coronary artery of native heart without angina pectoris 11/06/2019   Diastolic dysfunction 11/06/2019   Non-ST elevation (NSTEMI) myocardial infarction (HCC) 06/26/2019   Tachycardia 06/25/2019   Hypertriglyceridemia 06/16/2019   Palpitations 06/12/2019   Morning headache 12/02/2018   Insomnia secondary to chronic pain 12/02/2018   Retrognathia 12/02/2018   TIA (transient ischemic attack) 11/03/2018   Cervical stenosis of spine 01/16/2018   Hyperlipidemia 07/19/2017   Obesity 01/30/2017   Urinary frequency 12/11/2016   Insomnia 09/13/2016   History of  chicken pox    History of shingles    Preventative health care 08/14/2013   Anxiety    History of kidney stones    GERD (gastroesophageal reflux disease)    Type 2 diabetes mellitus with complication, with long-term current use of insulin (HCC)    Essential hypertension, benign    Osteoarthritis    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    Class: Chronic    Past Medical History:  Diagnosis Date   Anxiety     CAD in native artery    a. cath 06/26/2019 - 80% apical LAD >> medical management    Chronic headaches    Chronic neck pain    Coronary artery disease involving native coronary artery of native heart without angina pectoris 11/06/2019   GERD (gastroesophageal reflux disease)    occasional - OTC as needed   History of chicken pox    History of kidney stones    History of shingles    Hypertension    under control with med., had been on med. 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   And left now    SVT (supraventricular tachycardia) (HCC) 06/26/2019   Tachycardia    TIA (transient ischemic attack) 11/03/2018   Type 2 diabetes mellitus with complication, with long-term current use of insulin (HCC)    Follows with endocrinology, Dr Chestine Spore reports his last hgba1c was 7.3    Urinary frequency 12/11/2016    Past Surgical History:  Procedure Laterality Date   ANTERIOR CERVICAL DECOMP/DISCECTOMY FUSION N/A 01/16/2018   Procedure: ANTERIOR CERVICAL DECOMPRESSION/DISCECTOMY FUSION CERVICAL FOUR-FIVE, CERVICAL FIVE-SIX;  Surgeon: Lisbeth Renshaw, MD;  Location: [facility] OR;  Service: Neurosurgery;  Laterality: N/A;  ANTERIOR CERVICAL DECOMPRESSION/DISCECTOMY FUSION CERVICAL FOUR-FIVE, CERVICAL FIVE-SIX   CHOLECYSTECTOMY  1980's   CYSTOSCOPY/RETROGRADE/URETEROSCOPY/STONE EXTRACTION WITH BASKET Right 08/27/2003   and double J stent placement   ELECTROPHYSIOLOGY STUDY N/A 07/23/2019   Procedure: ELECTROPHYSIOLOGY STUDY;  Surgeon: Marinus Maw, MD;   Location: [facility] IN[facility];  Service: Cardiovascular;  Laterality: N/A;   INGUINAL HERNIA REPAIR Left 1990's   KNEE ARTHROSCOPY Left 1980's   KNEE ARTHROSCOPY Right 2013   KNEE ARTHROSCOPY Right 06/30/2013   Procedure: RIGHT KNEE ARTHROSCOPY AND SYNOVECTOMY;  Surgeon: Velna Ochs, MD;  Location: [facility];  Service: Orthopedics;  Laterality: Right;   LEFT HEART CATH AND CORONARY ANGIOGRAPHY N/A 06/26/2019   Procedure: LEFT HEART CATH AND CORONARY ANGIOGRAPHY;  Surgeon: Marykay Lex, MD;  Location: [facility] INVASIVE CV LAB;  Service: Cardiovascular;  Laterality: N/A;   LITHOTRIPSY     multiple, b/l   SHOULDER ADHESION RELEASE Left ` 1998   SHOULDER ARTHROSCOPY W/ ROTATOR CUFF REPAIR Left 10/20/2004   x 4, had to have part of clavile removed. torn rotator cuff multiple times, had to place screws    SHOULDER ARTHROSCOPY W/ SUPERIOR LABRAL ANTERIOR POSTERIOR LESION REPAIR Left 12/22/2004   SVT ABLATION N/A 07/23/2019   Procedure: SVT ABLATION;  Surgeon: Marinus Maw, MD;  Location: [facility];  Service: Cardiovascular;  Laterality: N/A;   TOTAL HIP ARTHROPLASTY Right 10/10/2021   Procedure: Right TOTAL HIP ARTHROPLASTY ANTERIOR APPROACH;  Surgeon: Kathryne Hitch, MD;  Location: [facility] OR;  Service: Orthopedics;  Laterality: Right;   TOTAL KNEE ARTHROPLASTY Right 12/23/2012   x3, 2 arthroscopies and a TKR,  TOTAL KNEE ARTHROPLASTY;  Surgeon: Velna Ochs, MD;  Location: [facility] OR;  Service: Orthopedics;  Laterality: Right;  DEPUY, RNFA   WRIST SURGERY Right    x 3, torn ligaments, pins placed then infected, then fused with plate    Social History   Tobacco Use   Smoking status: Never   Smokeless tobacco: Current    Types: Snuff  Vaping Use   Vaping status: Never Used  Substance Use Topics   Alcohol use: No   Drug use: No    Family History  Problem Relation Age of Onset   COPD Mother        smoker   Hypertension Mother    Heart disease Mother         CHF   Diabetes Father    Heart disease Father        CABG in 39's   Stroke Father    Hypertension Father    Peripheral vascular disease Father    Hypertension Son        tachycardia   Heart disease Maternal Grandfather    Arthritis Paternal Grandmother    Colon cancer Neg Hx    Rectal cancer Neg Hx    Stomach cancer Neg Hx     Allergies  Allergen Reactions   Rosuvastatin Other (See Comments)    Joint pain   Toradol [Ketorolac Tromethamine] Itching    Medication list has been reviewed and updated.  Current Outpatient Medications on File Prior to Visit  Medication Sig Dispense Refill   acetaminophen (TYLENOL) 500 MG tablet Take 1,000-1,500 mg by mouth 2 (two) times daily as needed for moderate pain.     acetaminophen-codeine (TYLENOL #3) 300-30 MG tablet Take 1-2 tablets by mouth every 8 (eight) hours as needed for moderate pain (pain score 4-6). 30 tablet 2   aspirin EC 325 MG tablet Take by mouth.     atorvastatin (LIPITOR) 40 MG tablet Take 1 tablet (40 mg total) by mouth at bedtime. 90 tablet 2   carvedilol (COREG) 6.25 MG tablet Take by mouth.     clopidogrel (PLAVIX) 75 MG tablet Take 1 tablet by mouth once daily with breakfast 90 tablet 0   escitalopram (LEXAPRO) 20 MG tablet Take 1 tablet by mouth once daily 90 tablet 0   fenofibrate 160 MG tablet Take by mouth.     flecainide (TAMBOCOR) 50 MG tablet Take 1 tablet (50 mg total) by mouth 2 (two) times daily. 180 tablet 3   HYDROcodone-acetaminophen (NORCO/VICODIN) 5-325 MG tablet Take 1 tablet by mouth every 6 (six) hours as needed for moderate pain. 30 tablet 0   insulin glargine (LANTUS SOLOSTAR) 100 UNIT/ML Solostar Pen Inject 60 Units into the skin at bedtime. 30 mL 1   isosorbide mononitrate (IMDUR) 30 MG 24 hr tablet Take 1 tablet (30 mg total) by mouth daily. TAKE 1 TABLET BY MOUTH ONCE DAILY 90 tablet 0   lansoprazole (PREVACID) 30 MG capsule Take by mouth.     levothyroxine (SYNTHROID) 25 MCG tablet TAKE 1  TABLET BY MOUTH ONCE DAILY BEFORE BREAKFAST 90 tablet 1   lisinopril (ZESTRIL) 10 MG tablet Take 1  tablet (10 mg total) by mouth daily. 90 tablet 3   metFORMIN (GLUMETZA) 1000 MG (MOD) 24 hr tablet Take 1 tablet (1,000 mg total) by mouth daily with breakfast. TAKE 2 TABLETS BY MOUTH TWICE DAILY WITH MEALS 180 tablet 1   metoprolol succinate (TOPROL-XL) 50 MG 24 hr tablet TAKE 1 TABLET BY MOUTH IN THE MORNING AND 1/2 (ONE-HALF) IN THE EVENING WITH  OR  IMMEDIATELY  FOLLOWING  A  MEAL. 135 tablet 0   nitroGLYCERIN (NITROSTAT) 0.4 MG SL tablet Place 1 tablet (0.4 mg total) under the tongue every 5 (five) minutes as needed. 25 tablet 12   omeprazole (PRILOSEC) 20 MG capsule Take 1 capsule (20 mg total) by mouth at bedtime. 30 capsule 2   rOPINIRole (REQUIP) 0.5 MG tablet TAKE 2 TABLETS BY MOUTH AT BEDTIME 180 tablet 0   Semaglutide, 1 MG/DOSE, (OZEMPIC, 1 MG/DOSE,) 4 MG/3ML SOPN INJECT 1 MG AS DIRECTED ONCE A WEEK 3 mL 1   terbinafine (LAMISIL) 250 MG tablet Take 1 tablet (250 mg total) by mouth daily. 90 tablet 0   tiZANidine (ZANAFLEX) 4 MG tablet Take 1 tablet (4 mg total) by mouth every 8 (eight) hours as needed for muscle spasms. 40 tablet 1   No current facility-administered medications on file prior to visit.    Review of Systems:  As per HPI- otherwise negative.   Physical Examination: There were no vitals filed for this visit. There were no vitals filed for this visit. There is no height or weight on file to calculate BMI. Ideal Body Weight:    GEN: no acute distress. HEENT: Atraumatic, Normocephalic.  Ears and Nose: No external deformity. CV: RRR, No M/G/R. No JVD. No thrill. No extra heart sounds. PULM: CTA B, no wheezes, crackles, rhonchi. No retractions. No resp. distress. No accessory muscle use. ABD: S, NT, ND, +BS. No rebound. No HSM. EXTR: No c/c/e PSYCH: Normally interactive. Conversant.    Assessment and Plan: ***  Signed Abbe Amsterdam, MD

## 2023-07-08 ENCOUNTER — Ambulatory Visit: Payer: Medicare HMO | Admitting: Family Medicine

## 2023-07-08 DIAGNOSIS — M1711 Unilateral primary osteoarthritis, right knee: Secondary | ICD-10-CM

## 2023-07-08 DIAGNOSIS — Z125 Encounter for screening for malignant neoplasm of prostate: Secondary | ICD-10-CM

## 2023-07-08 DIAGNOSIS — E118 Type 2 diabetes mellitus with unspecified complications: Secondary | ICD-10-CM

## 2023-07-08 DIAGNOSIS — M25551 Pain in right hip: Secondary | ICD-10-CM

## 2023-07-08 DIAGNOSIS — Z5181 Encounter for therapeutic drug level monitoring: Secondary | ICD-10-CM

## 2023-07-12 ENCOUNTER — Other Ambulatory Visit: Payer: Self-pay | Admitting: Family Medicine

## 2023-07-12 DIAGNOSIS — G2581 Restless legs syndrome: Secondary | ICD-10-CM

## 2023-07-18 ENCOUNTER — Other Ambulatory Visit: Payer: Self-pay | Admitting: Family Medicine

## 2023-07-18 DIAGNOSIS — B351 Tinea unguium: Secondary | ICD-10-CM

## 2023-07-23 ENCOUNTER — Encounter: Payer: Self-pay | Admitting: Family Medicine

## 2023-07-23 DIAGNOSIS — M47816 Spondylosis without myelopathy or radiculopathy, lumbar region: Secondary | ICD-10-CM

## 2023-07-23 NOTE — Telephone Encounter (Signed)
Looks like pt was referred on September by Dr Magnus Ivan- never answered. Please advise.

## 2023-08-05 ENCOUNTER — Ambulatory Visit: Payer: Medicare HMO | Admitting: Physician Assistant

## 2023-08-05 ENCOUNTER — Encounter: Payer: Self-pay | Admitting: Physician Assistant

## 2023-08-05 ENCOUNTER — Other Ambulatory Visit: Payer: Self-pay | Admitting: Radiology

## 2023-08-05 DIAGNOSIS — M5441 Lumbago with sciatica, right side: Secondary | ICD-10-CM

## 2023-08-05 NOTE — Progress Notes (Signed)
HPI: Sean Horn comes in today due to right lower leg pain.  He states that physical therapy was somewhat helpful.  However he continues to have severe pain down the right leg to the knee.  Back pain awakens him at night.  He takes Tylenol ibuprofen and hydrocodone which his primary care physician gives him. MRI dated 04/19/2023 did showed multilevel lumbar spondylosis.  At L4-5 he had moderate left and mild right subarticular recess stenosis and moderate left and mild right foraminal stenosis.  No canal stenosis throughout the lumbar spine.  Review of systems: Please see HPI otherwise negative or noncontributory.  Physical exam: General Well-developed well-nourished male no acute distress mood affect appropriate. Psych: Alert and oriented x 3 Bilateral hips: Good range of motion both hips without pain.  Mild tenderness over the right trochanteric region. Lumbar spine: Full forward flexion comes within the few inches of being able to touch his toes.  He has limited extension lumbar spine.  Negative straight leg raise bilaterally.  Impression: Lumbar radiculopathy  Plan: Given the fact that he has failed conservative treatment which is included therapy and medications.  Recommend epidural steroid injection with Sean Horn for his right radicular symptoms.  He is on Plavix.  Will follow-up with Sean Horn at 2 weeks after the injection see what type of response he had.  Questions were encouraged and answered

## 2023-08-06 ENCOUNTER — Telehealth: Payer: Self-pay

## 2023-08-06 NOTE — Telephone Encounter (Signed)
DPR ok to leave message for son Chrissie Noa. Left message to have the pt call our office [phone] to schedule an appt in office for preop clearance. Per preop APP pt missed his 6 month f/u and is now also needing preop clearance.

## 2023-08-06 NOTE — Telephone Encounter (Signed)
   Name: Sean Horn  DOB: 1961-01-17  MRN: [id number]  Primary Cardiologist: Kardie Tobb, DO  Chart reviewed as part of pre-operative protocol coverage. Because of [name]'s past medical history and time since last visit, he will require a follow-up in-office visit in order to better assess preoperative cardiovascular risk.  Pre-op covering staff: - Please schedule appointment and call patient to inform them. If patient already had an upcoming appointment within acceptable timeframe, please add pre-op clearance to the appointment notes so provider is aware. - Please contact requesting surgeon's office via preferred method (i.e, phone, fax) to inform them of need for appointment prior to surgery.  Patient is on clopidogrel  and full dose aspirin  for history of 80% apical LAD disease as well as TIA.  Request was for a 7-day Plavix  hold.  I think as long as asymptomatic during the time of office visit, can hold Plavix  x 7 days.  Please resume when medically safe to do so.  Would continue aspirin  throughout if able.   [name], PA-C  08/06/2023, 11:34 AM

## 2023-08-06 NOTE — Telephone Encounter (Signed)
   Pre-operative Risk Assessment    Patient Name: Sean Horn  DOB: 06-18-1961 MRN: [id number]   Date of last office visit: 12/17/22 Date of next office visit: not scheduled   Request for Surgical Clearance    Procedure:   R L5-S1 IL esi  Date of Surgery:  Clearance TBD                                Surgeon:  Not listed Surgeon's Group or Practice Name:  [facility] Phone number:  [phone] Fax number:  [phone]   Type of Clearance Requested:   - Medical  - Pharmacy:  Hold Clopidogrel  (Plavix ) x7 days   Type of Anesthesia:  Not Indicated   Additional requests/questions:    Bonney Ival LOISE Gerome   08/06/2023, 11:22 AM

## 2023-08-09 NOTE — Telephone Encounter (Signed)
 2nd attempt to reach the pt, could not leave message for call back on the pt's # [phone]. Have left a 2nd message on the son's # [phone].

## 2023-08-12 ENCOUNTER — Ambulatory Visit: Payer: Medicare HMO | Admitting: Physician Assistant

## 2023-08-12 ENCOUNTER — Telehealth: Payer: Self-pay | Admitting: Radiology

## 2023-08-12 ENCOUNTER — Encounter: Payer: Self-pay | Admitting: Cardiology

## 2023-08-12 NOTE — Telephone Encounter (Signed)
 Patient left voicemail that he was returning a call in regards to his back injection.  Per chart, he has left a My Chart message today for cardiology to get the clearance stating he can get the injection.  CB [phone]

## 2023-08-12 NOTE — Telephone Encounter (Signed)
 Patient scheduled to see Joni Reining, NP on 08/15/23 for preop clearance

## 2023-08-13 NOTE — Progress Notes (Addendum)
 Cardiology Office Note:  .   Date:  08/15/2023  ID:  Sean Horn, DOB Jun 28, 1961, MRN [id number] PCP: Watt Harlene BROCKS, MD  [facility] Providers Cardiologist:  Dub Huntsman, DO  History of Present Illness: .   Sean Horn is a 63 y.o. 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 Cathlyn Birmingham.    He had a episode of syncope in August 2021, ZIO monitor revealed nonsustained ventricular tachycardia.  He was placed on increased dose of beta-blocker 50 mg in the morning and 25 mg at night in May 2023, and was seen by Dr. Birmingham who started him on low-dose flecainide .  Last seen by Dr. Huntsman on 12/17/2018 for he was stable from cardiac standpoint and tolerating flecainide .  He comes today for preoperative cardiac evaluation in order to have right L5-S1, IL ESI by Ortho care in [place] on TBD.  Recommendations concerning holding clopidogrel  for 7 days prior to procedure.   Since being seen last he has been out of his flecainide  for about a week to 10 days.  Since that ran out and has not yet been refilled despite speaking with the pharmacy. He did have a couple episodes where he was having some trouble breathing which was short-lived.  He states that it was similar to what he experienced before being putt on the flecainide .  He is due to see Dr. Birmingham February 2025.  He offers no complaints of chest pain, bleeding, melena, hemoptysis, on antiplatelet,no dizziness, racing heart rate or palpitations.  He is otherwise medically compliant.  ROS: As above otherwise negative  Studies Reviewed: SABRA     Exercise Stress Test 09/19/2022 Baseline ECG is normal. ECG rhythm shows normal sinus rhythm. Resting ECG shows no ST-segment deviation.   Exercise capacity was normal. Patient exercised for  8 min and 38 sec. Maximum HR of 137 bpm. MPHR 86.0 %. Peak METS 8.5 . The patient experienced no angina during the test. The patient achieved the target heart rate. The patient requested the test to be stopped. Hypertensive blood pressure and normal heart rate response noted during stress. Heart rate recovery was normal.   No ST deviation was noted. There were no arrhythmias during stress. There were no arrhythmias during recovery. ECG was interpretable and conclusive. The ECG was negative for ischemia.    EKG Interpretation Date/Time:  Thursday August 15 2023 09:01:51 EST Ventricular Rate:  70 PR Interval:  190 QRS Duration:  92 QT Interval:  412 QTC Calculation: 444 R Axis:   8  Text Interpretation: Normal sinus rhythm Normal ECG When compared with ECG of 06-Oct-2021 11:14, T wave amplitude has increased in Lateral leads Confirmed by Jerilynn Collar [phone]) on 08/15/2023 9:59:28 AM    Physical Exam:   VS:  BP (!) 154/92 (BP Location: Right Arm, Patient Position: Sitting, Cuff Size: Normal)   Pulse 70   Ht 6' (1.829 m)   Wt 223 lb (101.2 kg)   SpO2 96%   BMI 30.24 kg/m    Wt Readings from Last 3 Encounters:  08/15/23 223 lb (101.2 kg)  03/28/23 217 lb 9.6 oz (98.7 kg)  01/03/23 213 lb 6.4 oz (96.8 kg)    GEN: Well nourished, well developed in no acute distress, is wearing glasses NECK: No JVD; No carotid bruits CARDIAC: RRR, no  murmurs, rubs, gallops RESPIRATORY:  Clear to auscultation without rales, wheezing or rhonchi  ABDOMEN: Soft, non-tender, non-distended EXTREMITIES:  No edema; No deformity   ASSESSMENT AND PLAN: .   Preoperative cardiac evaluation/clearance: According to the Revised Cardiac Risk Index (RCRI), his Perioperative Risk of Major Cardiac Event is (%): 0.9  His Functional Capacity in METs is: 8.97 according to the Duke Activity Status Index (DASI).   Per office protocol, if patient is without any new symptoms or concerns at the time of their virtual  visit, he may hold Plavix  for 5-7 days prior to procedure. Please resume Plavix  as soon as possible postprocedure, at the discretion of the surgeon.    .Therefore, based on ACC/AHA guidelines, patient would be at acceptable risk for the planned procedure without further cardiovascular testing. I will route this recommendation to the requesting party via Epic fax function.        2.  Coronary artery disease: History of NSTEMI November 2020, 80% apical LAD lesions but medical management not being a suitable candidate for PCI.  He is doing well on isosorbide  mononitrate, he remains on carvedilol  6.25 mg twice daily, Plavix  75 mg daily, and statin therapy.  He denies any cardiac complaints with exception of mild shortness of breath which is short-lived.  No plan cardiac testing at this time.  3.  SVT/AVNRT: Status post ablation by Dr. Waddell in December 2020.  He ran out of his flecainide  for little over a week and has noticed some issues with shortness of breath which is very short-lived a few seconds to a minute goes away on its own.  This was similar to what he was feeling prior to having been on the flecainide .  This is refilled at 50 mg twice daily with a 90-day supply.  He is to follow-up with Dr. Waddell in February 2025 on prerequested appointment on recall list.  This will be made for him as he leaves our office today.  3.  Hypertension: Elevated in the office today.  He admits to some whitecoat syndrome.  The patient states that at home his blood pressures running 110/60 and they did stop lisinopril  as a result of hypotension.  He states he will keep up with this if blood pressure begins to rise he will talk about it with Dr. Waddell on upcoming appointment.  Prior he was on lisinopril  20 mg daily.  Will not restart as this is a one-time blood pressure elevation in the office today.  4.  Hypercholesterolemia: Labs are followed by his primary care provider Dr. Harlene Perfect.  He remains on  atorvastatin  40 mg daily with goal of LDL less than 70 with known history of CAD.  Most recent labs drawn on 12/12/2022 total cholesterol 179, HDL 28, LDL 64.  Triglycerides were elevated at 569.  He does have diabetes.    Signed, Lamarr HERO. Jerilynn CHOL, ANP, AACC

## 2023-08-14 NOTE — Telephone Encounter (Signed)
For [facility]

## 2023-08-15 ENCOUNTER — Other Ambulatory Visit: Payer: Self-pay

## 2023-08-15 ENCOUNTER — Ambulatory Visit: Payer: Medicare HMO | Attending: Adult Health | Admitting: Adult Health

## 2023-08-15 ENCOUNTER — Encounter: Payer: Self-pay | Admitting: Adult Health

## 2023-08-15 ENCOUNTER — Other Ambulatory Visit: Payer: Self-pay | Admitting: Adult Health

## 2023-08-15 VITALS — BP 154/92 | HR 70 | Ht 72.0 in | Wt 223.0 lb

## 2023-08-15 DIAGNOSIS — Z01810 Encounter for preprocedural cardiovascular examination: Secondary | ICD-10-CM | POA: Diagnosis not present

## 2023-08-15 DIAGNOSIS — I4729 Other ventricular tachycardia: Secondary | ICD-10-CM

## 2023-08-15 DIAGNOSIS — I214 Non-ST elevation (NSTEMI) myocardial infarction: Secondary | ICD-10-CM | POA: Diagnosis not present

## 2023-08-15 DIAGNOSIS — I471 Supraventricular tachycardia, unspecified: Secondary | ICD-10-CM

## 2023-08-15 DIAGNOSIS — I2510 Atherosclerotic heart disease of native coronary artery without angina pectoris: Secondary | ICD-10-CM | POA: Diagnosis not present

## 2023-08-15 DIAGNOSIS — E7800 Pure hypercholesterolemia, unspecified: Secondary | ICD-10-CM

## 2023-08-15 DIAGNOSIS — I10 Essential (primary) hypertension: Secondary | ICD-10-CM

## 2023-08-15 MED ORDER — METOPROLOL SUCCINATE ER 50 MG PO TB24: 50.0000 mg | ORAL_TABLET | Freq: Every day | ORAL | 0 refills | Status: DC

## 2023-08-15 MED ORDER — ISOSORBIDE MONONITRATE ER 30 MG PO TB24: 30.0000 mg | ORAL_TABLET | Freq: Every day | ORAL | 0 refills | Status: DC

## 2023-08-15 MED ORDER — CARVEDILOL 6.25 MG PO TABS: 6.2500 mg | ORAL_TABLET | Freq: Two times a day (BID) | ORAL | 3 refills | Status: DC

## 2023-08-15 MED ORDER — FLECAINIDE ACETATE 50 MG PO TABS: 50.0000 mg | ORAL_TABLET | Freq: Two times a day (BID) | ORAL | 3 refills | Status: DC

## 2023-08-15 NOTE — Telephone Encounter (Signed)
 Pt's pharmacy is requesting clarification for medication metoprolol. Does pt still suppose to take metoprolol 50 mg 1 1/2 tablets daily? If so this was not sent in with these directions. Please address

## 2023-08-15 NOTE — Patient Instructions (Signed)
 Medication Instructions:  No changes *If you need a refill on your cardiac medications before your next appointment, please call your pharmacy*   Lab Work: No labs If you have labs (blood work) drawn today and your tests are completely normal, you will receive your results only by: MyChart Message (if you have MyChart) OR A paper copy in the mail If you have any lab test that is abnormal or we need to change your treatment, we will call you to review the results.   Testing/Procedures: No Testing   Follow-Up: At [facility], you and your health needs are our priority.  As part of our continuing mission to provide you with exceptional heart care, we have created designated Provider Care Teams.  These Care Teams include your primary Cardiologist (physician) and Advanced Practice Providers (APPs -  Physician Assistants and Nurse Practitioners) who all work together to provide you with the care you need, when you need it.  We recommend signing up for the patient portal called MyChart.  Sign up information is provided on this After Visit Summary.  MyChart is used to connect with patients for Virtual Visits (Telemedicine).  Patients are able to view lab/test results, encounter notes, upcoming appointments, etc.  Non-urgent messages can be sent to your provider as well.   To learn more about what you can do with MyChart, go to [email or web address].    Your next appointment:  1 year   Provider:    Kardie Tobb, DO

## 2023-08-20 ENCOUNTER — Encounter: Payer: Self-pay | Admitting: Cardiology

## 2023-08-20 ENCOUNTER — Telehealth: Payer: Self-pay | Admitting: Physical Medicine and Rehabilitation

## 2023-08-20 MED ORDER — NITROGLYCERIN 0.4 MG SL SUBL: 0.4000 mg | SUBLINGUAL_TABLET | SUBLINGUAL | 12 refills | Status: AC | PRN

## 2023-08-20 NOTE — Telephone Encounter (Signed)
 Patient called to let you know that he is cleared to get the injection in his back. CB#[phone]

## 2023-08-22 ENCOUNTER — Other Ambulatory Visit: Payer: Self-pay

## 2023-08-22 NOTE — Telephone Encounter (Signed)
[facility] pharmacy needing clarification on medication metoprolol. Pt was taking 1 tablet in the morning and a 1/2 tablet in the evening, but not it says pt takes daily but 135 tablets were ordered. Please clarify how pt is suppose to take medication. Thanks

## 2023-08-28 ENCOUNTER — Other Ambulatory Visit: Payer: Self-pay | Admitting: "Endocrinology

## 2023-09-02 ENCOUNTER — Telehealth: Payer: Self-pay | Admitting: Physical Medicine and Rehabilitation

## 2023-09-02 NOTE — Telephone Encounter (Signed)
Pt called stating he need to cancel this coming appt wit Dr Alvester Morin. Just cancel no call back needed.

## 2023-09-04 ENCOUNTER — Encounter: Payer: Medicare HMO | Admitting: Physical Medicine and Rehabilitation

## 2023-09-06 ENCOUNTER — Other Ambulatory Visit: Payer: Self-pay | Admitting: Internal Medicine

## 2023-09-06 ENCOUNTER — Other Ambulatory Visit: Payer: Self-pay | Admitting: Cardiology

## 2023-09-11 ENCOUNTER — Other Ambulatory Visit: Payer: Self-pay

## 2023-09-11 MED ORDER — METOPROLOL SUCCINATE ER 50 MG PO TB24: 50.0000 mg | ORAL_TABLET | Freq: Every day | ORAL | 4 refills | Status: AC

## 2023-09-11 NOTE — Telephone Encounter (Signed)
Pt's pharmacy is requesting clarification for pt's direction on taking metoprolol. Pt was prescribed 135 tablet, with different directions. Please clarify

## 2023-09-16 ENCOUNTER — Other Ambulatory Visit: Payer: Self-pay | Admitting: Family Medicine

## 2023-09-16 DIAGNOSIS — M1711 Unilateral primary osteoarthritis, right knee: Secondary | ICD-10-CM

## 2023-09-17 ENCOUNTER — Encounter: Payer: Self-pay | Admitting: Family Medicine

## 2023-09-25 ENCOUNTER — Telehealth (INDEPENDENT_AMBULATORY_CARE_PROVIDER_SITE_OTHER): Payer: Medicare Other | Admitting: Family Medicine

## 2023-09-25 ENCOUNTER — Encounter: Payer: Self-pay | Admitting: Family Medicine

## 2023-09-25 DIAGNOSIS — F5101 Primary insomnia: Secondary | ICD-10-CM

## 2023-09-25 DIAGNOSIS — I10 Essential (primary) hypertension: Secondary | ICD-10-CM | POA: Diagnosis not present

## 2023-09-25 DIAGNOSIS — E118 Type 2 diabetes mellitus with unspecified complications: Secondary | ICD-10-CM

## 2023-09-25 DIAGNOSIS — Z794 Long term (current) use of insulin: Secondary | ICD-10-CM

## 2023-09-25 DIAGNOSIS — Z125 Encounter for screening for malignant neoplasm of prostate: Secondary | ICD-10-CM

## 2023-09-25 DIAGNOSIS — M25551 Pain in right hip: Secondary | ICD-10-CM | POA: Diagnosis not present

## 2023-09-25 DIAGNOSIS — E782 Mixed hyperlipidemia: Secondary | ICD-10-CM | POA: Diagnosis not present

## 2023-09-25 MED ORDER — TRAZODONE HCL 50 MG PO TABS: 25.0000 mg | ORAL_TABLET | Freq: Every evening | ORAL | 3 refills | Status: DC | PRN

## 2023-09-25 NOTE — Progress Notes (Signed)
[facility] [address] [place], [place] [address] [phone]  Date:  09/25/2023   Name:  Sean Horn   DOB:  1960/10/04   MRN:  [id number]  PCP:  Pearline Cables, MD    Chief Complaint: No chief complaint on file.   History of Present Illness:  Sean Horn is a 63 y.o. very pleasant male patient who presents with the following:  Virtual visit today for recheck- virtual due to weather Pt location is home, my location is office.   Patient identity confirmed with 2 factors, he gives consent for virtual visit today.  The patient and myself are present on the call today. Last seen by myself in August  history of TIA, SVT, end-stage arthritis of knees and hips, diabetes, hyperlipidemia, hypertension, CAD  He is under cardiology care Has been using Tylenol No. 3 for joint pain  He was seen most recently by orthopedics PA on December 30-it looks like at that time they planned epidural steroid injections with Dr. Alvester Morin for radicular symptoms  He recently had a cardiology clearance visit for upcoming surgery- however it truns out his back is doing much better His main issue right now is the hip  He is doing ok with his tylenol #3  He notes some difficulty staying asleep- he will get to sleep ok but will wake up frequently during the night.  He sometimes wakes up due to orthopedic pain but not always.  He has trouble getting back to sleep once he is awake.  He notes this is causing him to get sometimes only 3 or 4 hours of sleep at night which is really hard on him.  He has already tried melatonin as well as Benadryl.  He is also try taking 2 of his Tylenol with codeine but that did not particularly help  Patient Active Problem List   Diagnosis Date Noted   Hypothyroidism 09/05/2022   Unilateral primary osteoarthritis, left hip 01/30/2022   Syncope and collapse 01/22/2022   PSVT (paroxysmal supraventricular  tachycardia) (HCC) 12/12/2021   NSVT (nonsustained ventricular tachycardia) (HCC) 12/12/2021   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   Unilateral primary osteoarthritis, right hip 06/26/2021   Hyperbilirubinemia 11/15/2020   PONV (postoperative nausea and vomiting)    Type 2 diabetes mellitus without complication, with long-term current use of insulin (HCC)    Chronic neck pain    Chronic headaches    CAD in native artery    Coronary artery disease involving native coronary artery of native heart without angina pectoris 11/06/2019   Diastolic dysfunction 11/06/2019   Non-ST elevation (NSTEMI) myocardial infarction (HCC) 06/26/2019   Tachycardia 06/25/2019   Hypertriglyceridemia 06/16/2019   Palpitations 06/12/2019   Morning headache 12/02/2018   Insomnia secondary to chronic pain 12/02/2018   Retrognathia 12/02/2018   TIA (transient ischemic attack) 11/03/2018   Cervical stenosis of spine 01/16/2018   Hyperlipidemia 07/19/2017   Obesity 01/30/2017   Urinary frequency 12/11/2016   Insomnia 09/13/2016   History of chicken pox    History of shingles    Preventative health care 08/14/2013   Anxiety    History of kidney stones    GERD (gastroesophageal reflux disease)    Essential hypertension, benign    Osteoarthritis    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    Class: Chronic    Past Medical History:  Diagnosis Date   Anxiety    CAD  in native artery    a. cath 06/26/2019 - 80% apical LAD >> medical management    Chronic headaches    Chronic neck pain    Coronary artery disease involving native coronary artery of native heart without angina pectoris 11/06/2019   GERD (gastroesophageal reflux disease)    occasional - OTC as needed   History of chicken pox    History of kidney stones    History of shingles    Hypertension    under control with med., had been on med. since age 57   Insomnia 09/13/2016   Insulin dependent diabetes mellitus    Follows with endocrinology,  Dr Chestine Spore reports his last hgba1c was 7.3    Non-insulin dependent type 2 diabetes mellitus (HCC)    Non-ST elevation (NSTEMI) myocardial infarction (HCC) 06/26/2019   Obesity 01/30/2017   Occipital neuralgia    Osteoarthritis    right knee   PONV (postoperative nausea and vomiting)    PONV (postoperative nausea and vomiting)    Preventative health care 08/14/2013   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   And left now    SVT (supraventricular tachycardia) (HCC) 06/26/2019   Tachycardia    TIA (transient ischemic attack) 11/03/2018   Type 2 diabetes mellitus with complication, with long-term current use of insulin (HCC)    Follows with endocrinology, Dr Chestine Spore reports his last hgba1c was 7.3    Urinary frequency 12/11/2016    Past Surgical History:  Procedure Laterality Date   ANTERIOR CERVICAL DECOMP/DISCECTOMY FUSION N/A 01/16/2018   Procedure: ANTERIOR CERVICAL DECOMPRESSION/DISCECTOMY FUSION CERVICAL FOUR-FIVE, CERVICAL FIVE-SIX;  Surgeon: Lisbeth Renshaw, MD;  Location: [facility] OR;  Service: Neurosurgery;  Laterality: N/A;  ANTERIOR CERVICAL DECOMPRESSION/DISCECTOMY FUSION CERVICAL FOUR-FIVE, CERVICAL FIVE-SIX   CHOLECYSTECTOMY  1980's   CYSTOSCOPY/RETROGRADE/URETEROSCOPY/STONE EXTRACTION WITH BASKET Right 08/27/2003   and double J stent placement   ELECTROPHYSIOLOGY STUDY N/A 07/23/2019   Procedure: ELECTROPHYSIOLOGY STUDY;  Surgeon: Marinus Maw, MD;  Location: [facility] IN[facility]AS[facility];  Service: Cardiovascular;  Laterality: N/A;   INGUINAL HERNIA REPAIR Left 1990's   KNEE ARTHROSCOPY Left 1980's   KNEE ARTHROSCOPY Right 2013   KNEE ARTHROSCOPY Right 06/30/2013   Procedure: RIGHT KNEE ARTHROSCOPY AND SYNOVECTOMY;  Surgeon: Velna Ochs, MD;  Location: [facility];  Service: Orthopedics;  Laterality: Right;   LEFT HEART CATH AND CORONARY ANGIOGRAPHY N/A 06/26/2019   Procedure: LEFT HEART CATH AND CORONARY ANGIOGRAPHY;  Surgeon: Marykay Lex, MD;  Location: [facility] INVASIVE CV LAB;   Service: Cardiovascular;  Laterality: N/A;   LITHOTRIPSY     multiple, b/l   SHOULDER ADHESION RELEASE Left ` 1998   SHOULDER ARTHROSCOPY W/ ROTATOR CUFF REPAIR Left 10/20/2004   x 4, had to have part of clavile removed. torn rotator cuff multiple times, had to place screws    SHOULDER ARTHROSCOPY W/ SUPERIOR LABRAL ANTERIOR POSTERIOR LESION REPAIR Left 12/22/2004   SVT ABLATION N/A 07/23/2019   Procedure: SVT ABLATION;  Surgeon: Marinus Maw, MD;  Location: [facility];  Service: Cardiovascular;  Laterality: N/A;   TOTAL HIP ARTHROPLASTY Right 10/10/2021   Procedure: Right TOTAL HIP ARTHROPLASTY ANTERIOR APPROACH;  Surgeon: Kathryne Hitch, MD;  Location: [facility] OR;  Service: Orthopedics;  Laterality: Right;   TOTAL KNEE ARTHROPLASTY Right 12/23/2012   x3, 2 arthroscopies and a TKR,  TOTAL KNEE ARTHROPLASTY;  Surgeon: Velna Ochs, MD;  Location: [facility] OR;  Service: Orthopedics;  Laterality: Right;  DEPUY, RNFA   WRIST SURGERY Right  x 3, torn ligaments, pins placed then infected, then fused with plate    Social History   Tobacco Use   Smoking status: Never   Smokeless tobacco: Current    Types: Snuff  Vaping Use   Vaping status: Never Used  Substance Use Topics   Alcohol use: No   Drug use: No    Family History  Problem Relation Age of Onset   COPD Mother        smoker   Hypertension Mother    Heart disease Mother        CHF   Diabetes Father    Heart disease Father        CABG in 73's   Stroke Father    Hypertension Father    Peripheral vascular disease Father    Hypertension Son        tachycardia   Heart disease Maternal Grandfather    Arthritis Paternal Grandmother    Colon cancer Neg Hx    Rectal cancer Neg Hx    Stomach cancer Neg Hx     Allergies  Allergen Reactions   Rosuvastatin Other (See Comments)    Joint pain   Toradol [Ketorolac Tromethamine] Itching    Medication list has been reviewed and updated.  Current Outpatient  Medications on File Prior to Visit  Medication Sig Dispense Refill   acetaminophen (TYLENOL) 500 MG tablet Take 1,000-1,500 mg by mouth 2 (two) times daily as needed for moderate pain.     acetaminophen-codeine (TYLENOL #3) 300-30 MG tablet Take 1-2 tablets by mouth every 8 (eight) hours as needed for moderate pain (pain score 4-6). 30 tablet 0   aspirin EC 325 MG tablet Take by mouth.     atorvastatin (LIPITOR) 40 MG tablet Take 1 tablet (40 mg total) by mouth at bedtime. 90 tablet 2   carvedilol (COREG) 6.25 MG tablet Take 1 tablet (6.25 mg total) by mouth 2 (two) times daily with a meal. 180 tablet 3   clopidogrel (PLAVIX) 75 MG tablet Take 1 tablet by mouth once daily with breakfast 90 tablet 3   escitalopram (LEXAPRO) 20 MG tablet Take 1 tablet by mouth once daily 90 tablet 0   fenofibrate 160 MG tablet Take by mouth.     flecainide (TAMBOCOR) 50 MG tablet Take 1 tablet (50 mg total) by mouth 2 (two) times daily. 180 tablet 3   HYDROcodone-acetaminophen (NORCO/VICODIN) 5-325 MG tablet Take 1 tablet by mouth every 6 (six) hours as needed for moderate pain. 30 tablet 0   insulin glargine (LANTUS SOLOSTAR) 100 UNIT/ML Solostar Pen Inject 60 Units into the skin at bedtime. 30 mL 1   isosorbide mononitrate (IMDUR) 30 MG 24 hr tablet Take 1 tablet (30 mg total) by mouth daily. TAKE 1 TABLET BY MOUTH ONCE DAILY 90 tablet 0   lansoprazole (PREVACID) 30 MG capsule Take by mouth.     levothyroxine (SYNTHROID) 25 MCG tablet TAKE 1 TABLET BY MOUTH ONCE DAILY BEFORE BREAKFAST 90 tablet 1   metFORMIN (GLUMETZA) 1000 MG (MOD) 24 hr tablet Take 1 tablet (1,000 mg total) by mouth daily with breakfast. TAKE 2 TABLETS BY MOUTH TWICE DAILY WITH MEALS 180 tablet 1   metoprolol succinate (TOPROL-XL) 50 MG 24 hr tablet Take 1 tablet (50 mg total) by mouth daily. Take with or immediately following a meal. 90 tablet 4   nitroGLYCERIN (NITROSTAT) 0.4 MG SL tablet Place 1 tablet (0.4 mg total) under the tongue every 5  (five) minutes as needed.  25 tablet 12   omeprazole (PRILOSEC) 20 MG capsule Take 1 capsule (20 mg total) by mouth at bedtime. 30 capsule 2   rOPINIRole (REQUIP) 0.5 MG tablet TAKE 2 TABLETS BY MOUTH AT BEDTIME 180 tablet 0   Semaglutide, 1 MG/DOSE, (OZEMPIC, 1 MG/DOSE,) 4 MG/3ML SOPN INJECT 1 MG AS DIRECTED ONCE A WEEK 3 mL 1   terbinafine (LAMISIL) 250 MG tablet Take 1 tablet (250 mg total) by mouth daily. 90 tablet 0   No current facility-administered medications on file prior to visit.    Review of Systems:  As per HPI- otherwise negative.   Physical Examination: There were no vitals filed for this visit. There were no vitals filed for this visit. There is no height or weight on file to calculate BMI. Ideal Body Weight:    Pt notes BP, HR ok Glucose running less than 150 generally  Lab Results  Component Value Date   HGBA1C 7.1 (H) 12/17/2022   Patient observed via MyChart video, he looks well  Assessment and Plan: Primary insomnia - Plan: traZODone (DESYREL) 50 MG tablet  Screening for prostate cancer - Plan: PSA  Type 2 diabetes mellitus with complication, with long-term current use of insulin (HCC) - Plan: Comprehensive metabolic panel, Hemoglobin A1c  Right hip pain  Essential hypertension - Plan: CBC, Comprehensive metabolic panel  Mixed hyperlipidemia - Plan: Lipid panel  Patient seen today for follow-up.  He has chronic right hip pain which is controlled with Tylenol with codeine.  He does not need to refill this right now but will let me know when he is due  He notes blood pressures been under good control  He is due for labs and A1c.  He reports good control of blood sugars at home.  He would like to have his labs drawn at [facility] if possible.  I put lab orders in epic for him to be done at [facility]  For insomnia we will try adding trazodone to his current regimen.  He is also taking Lexapro 20 mg.  Discussed potential risk of serotonin syndrome, I will  send him a MyChart message with further information If any evidence of serotonin syndrome he will stop medication right away Signed Abbe Amsterdam, MD

## 2023-09-26 ENCOUNTER — Other Ambulatory Visit: Payer: Self-pay | Admitting: Family Medicine

## 2023-10-15 ENCOUNTER — Other Ambulatory Visit: Payer: Self-pay | Admitting: Family Medicine

## 2023-10-15 DIAGNOSIS — F419 Anxiety disorder, unspecified: Secondary | ICD-10-CM

## 2023-10-21 ENCOUNTER — Other Ambulatory Visit: Payer: Self-pay | Admitting: Family Medicine

## 2023-10-21 DIAGNOSIS — M1711 Unilateral primary osteoarthritis, right knee: Secondary | ICD-10-CM

## 2023-11-07 ENCOUNTER — Encounter: Payer: Self-pay | Admitting: Family Medicine

## 2023-11-07 DIAGNOSIS — E118 Type 2 diabetes mellitus with unspecified complications: Secondary | ICD-10-CM

## 2023-11-08 MED ORDER — OZEMPIC (1 MG/DOSE) 4 MG/3ML ~~LOC~~ SOPN: 1.0000 mg | PEN_INJECTOR | SUBCUTANEOUS | 1 refills | Status: DC

## 2023-11-08 MED ORDER — LANTUS SOLOSTAR 100 UNIT/ML ~~LOC~~ SOPN: 60.0000 [IU] | PEN_INJECTOR | Freq: Every day | SUBCUTANEOUS | 1 refills | Status: DC

## 2023-11-08 NOTE — Addendum Note (Signed)
 Addended by: Abbe Amsterdam C on: 11/08/2023 01:43 PM   Modules accepted: Orders

## 2023-11-11 DIAGNOSIS — E1140 Type 2 diabetes mellitus with diabetic neuropathy, unspecified: Secondary | ICD-10-CM | POA: Diagnosis not present

## 2023-11-11 DIAGNOSIS — M533 Sacrococcygeal disorders, not elsewhere classified: Secondary | ICD-10-CM | POA: Diagnosis not present

## 2023-11-13 DIAGNOSIS — E113292 Type 2 diabetes mellitus with mild nonproliferative diabetic retinopathy without macular edema, left eye: Secondary | ICD-10-CM | POA: Diagnosis not present

## 2023-11-13 DIAGNOSIS — H43823 Vitreomacular adhesion, bilateral: Secondary | ICD-10-CM | POA: Diagnosis not present

## 2023-11-13 DIAGNOSIS — E113311 Type 2 diabetes mellitus with moderate nonproliferative diabetic retinopathy with macular edema, right eye: Secondary | ICD-10-CM | POA: Diagnosis not present

## 2023-11-13 LAB — HM DIABETES EYE EXAM

## 2023-11-19 DIAGNOSIS — M47814 Spondylosis without myelopathy or radiculopathy, thoracic region: Secondary | ICD-10-CM | POA: Diagnosis not present

## 2023-11-19 DIAGNOSIS — M4804 Spinal stenosis, thoracic region: Secondary | ICD-10-CM | POA: Diagnosis not present

## 2023-11-19 DIAGNOSIS — M5124 Other intervertebral disc displacement, thoracic region: Secondary | ICD-10-CM | POA: Diagnosis not present

## 2023-11-20 ENCOUNTER — Telehealth: Payer: Self-pay

## 2023-11-20 NOTE — Telephone Encounter (Signed)
 FYI. We received a fax from [facility] stating that the pt received a temp supply of his Ozempic because he needs a PA.  I could send to the PA team, but because he has not been seen it will likely be denied.  I will place this is scan.

## 2023-11-27 DIAGNOSIS — E113311 Type 2 diabetes mellitus with moderate nonproliferative diabetic retinopathy with macular edema, right eye: Secondary | ICD-10-CM | POA: Diagnosis not present

## 2023-12-11 DIAGNOSIS — M47816 Spondylosis without myelopathy or radiculopathy, lumbar region: Secondary | ICD-10-CM | POA: Diagnosis not present

## 2023-12-11 DIAGNOSIS — E1140 Type 2 diabetes mellitus with diabetic neuropathy, unspecified: Secondary | ICD-10-CM | POA: Diagnosis not present

## 2023-12-11 DIAGNOSIS — Z01818 Encounter for other preprocedural examination: Secondary | ICD-10-CM | POA: Diagnosis not present

## 2023-12-11 DIAGNOSIS — M533 Sacrococcygeal disorders, not elsewhere classified: Secondary | ICD-10-CM | POA: Diagnosis not present

## 2023-12-12 ENCOUNTER — Telehealth: Payer: Self-pay | Admitting: *Deleted

## 2023-12-12 NOTE — Telephone Encounter (Signed)
   Pre-operative Risk Assessment    Patient Name: KIRON EGGUM  DOB: [birth year] MRN: [id number]   Date of last office visit: 08/15/2023 Date of next office visit: N/A   Request for Surgical Clearance    Procedure:  SPINAL CORD STIMULATOR TRIAL WHICH ARE USED BEFORE PERMANENT ONES ARE PLACED TO TREAT CHRONIC PAIN.  LEADS PLACED IN THE EPIDURAL SPACE OF THE SPINE.  IF SUCCESSFUL, PERMANENT LEADS AND BATTERY ARE THEN IMPLANTED AT A LATER DATE  Date of Surgery:  Clearance TBD                                Surgeon:  DR. BINTRIM Surgeon's Group or Practice Name:  [facility] Phone number:  [phone] Fax number:  [phone]   Type of Clearance Requested:   - Medical  - Pharmacy:  Hold Clopidogrel  (Plavix ) 7 DAYS PRIOR AND 7 DAYS AFTER, RESUMING 24 HOURS AFTER TRIAL LEADS ARE PULLED   Type of Anesthesia:  Not Indicated   Additional requests/questions:    Signed, Maeola Schmidt   12/12/2023, 7:17 AM

## 2023-12-13 NOTE — Telephone Encounter (Signed)
   Name: Sean Horn  DOB: 1961-05-07  MRN: [id number]  Primary Cardiologist: Kardie Tobb, DO   Preoperative team, please contact this patient and set up a phone call appointment for further preoperative risk assessment. Please obtain consent and complete medication review. Thank you for your help.  I confirm that guidance regarding antiplatelet and oral anticoagulation therapy has been completed and, if necessary, noted below.  Per office protocol, he may hold Plavix  for 7 days prior to procedure and should resume as soon as hemodynamically stable postoperatively.   I also confirmed the patient resides in the state of [place] . As per [facility] Medical Board telemedicine laws, the patient must reside in the state in which the provider is licensed.   Morey Ar, NP 12/13/2023, 1:12 PM [facility]

## 2023-12-13 NOTE — Telephone Encounter (Signed)
 Attempted to call patient. First attempt.

## 2023-12-16 NOTE — Telephone Encounter (Signed)
 Left message for pt to call our office back to schedule TELE Preop appt.

## 2023-12-19 ENCOUNTER — Telehealth: Payer: Self-pay

## 2023-12-19 DIAGNOSIS — E118 Type 2 diabetes mellitus with unspecified complications: Secondary | ICD-10-CM

## 2023-12-19 NOTE — Telephone Encounter (Signed)
 Pt called and was triaged for HBP readings, pt has 2 readings in "the 300 range" today. Pt is requesting a refill of Novolog  to take "when eating" as pt states "it works hand and hand with my Lantus " advised pt a message would be sent back to PCP. Pt has a scheduled OV with PCP on 05/19/025.

## 2023-12-19 NOTE — Telephone Encounter (Addendum)
 Tried to call patient to schedule televisit no answer left a detailed message to call back this is the third attempt trying to contact patient and no answer will make requesting office aware and will take out patient out of the preop pool after three attempts

## 2023-12-19 NOTE — Telephone Encounter (Signed)
 Initial Comment Caller is asking for medication he says he was out of medication and now his glucose is unsteady he says his blood sugar drops and is fluctuating high and low. Caller says glucose now is above 250 his monitor would not connect while we were on the line Translation No Nurse Assessment Nurse: Daneil Dunker, RN, Merna Date/Time (Eastern Time): 12/19/2023 3:22:25 PM Confirm and document reason for call. If symptomatic, describe symptoms. ---Pt states that his bs is 317. He states that he is out of his novolog . Does the patient have any new or worsening symptoms? ---Yes Will a triage be completed? ---Yes Related visit to physician within the last 2 weeks? ---No Does the PT have any chronic conditions? (i.e. diabetes, asthma, this includes High risk factors for pregnancy, etc.) ---Yes List chronic conditions. ---dm Is this a behavioral health or substance abuse call? ---No Guidelines Guideline Title Affirmed Question Affirmed Notes Nurse Date/Time (Eastern Time) Diabetes - High Blood Sugar [1] Blood glucose > 300 mg/dL (57.8 mmol/L) AND [4] two or more times in a row Lerna, RN, Joni Net 12/19/2023 3:23:49 PM PLEASE NOTE: All timestamps contained within this report are represented as [place] Standard Time. CONFIDENTIALTY NOTICE: This fax transmission is intended only for the addressee. It contains information that is legally privileged, confidential or otherwise protected from use or disclosure. If you are not the intended recipient, you are strictly prohibited from reviewing, disclosing, copying using or disseminating any of this information or taking any action in reliance on or regarding this information. If you have received this fax in error, please notify us  immediately by telephone so that we can arrange for its return to us . Phone: [phone], Toll-Free: [phone], Fax: [phone] [facility] [birth year] Page: 2 of 2 CallId: [id number] Disp. Time  [facility] Time) Disposition Final User 12/19/2023 3:03:48 PM Attempt made - message left Bryna Car 12/19/2023 3:25:59 PM Call PCP Now Yes Daneil Dunker, RN, Merna Final Disposition 12/19/2023 3:25:59 PM Call PCP Now Yes Daneil Dunker, RN, Merna Caller Disagree/Comply Comply Caller Understands Yes PreDisposition Call Doctor Care Advice Given Per Guideline CALL PCP NOW: * You need to discuss this with your doctor (or NP/PA). CALL BACK IF: * Vomiting occurs * Rapid breathing occurs * You become worse

## 2023-12-19 NOTE — Telephone Encounter (Signed)
 Called patient but did not reach him at this time.  Let manage on machine -advised him I will send a MyChart message and we will give him a try tomorrow.  If he is in distress or not doing okay visit the ER please

## 2023-12-20 ENCOUNTER — Telehealth: Payer: Self-pay

## 2023-12-20 MED ORDER — INSULIN LISPRO 100 UNIT/ML IJ SOLN: INTRAMUSCULAR | 11 refills | Status: DC

## 2023-12-20 MED ORDER — INSULIN ASPART 100 UNIT/ML IJ SOLN: INTRAMUSCULAR | 99 refills | Status: DC

## 2023-12-20 NOTE — Addendum Note (Signed)
 Addended by: Gates Kasal C on: 12/20/2023 04:40 PM   Modules accepted: Orders

## 2023-12-20 NOTE — Addendum Note (Signed)
 Addended by: Gates Kasal C on: 12/20/2023 11:51 AM   Modules accepted: Orders

## 2023-12-20 NOTE — Telephone Encounter (Signed)
 Copied from CRM [id number]. Topic: Clinical - Medication Question >> Dec 20, 2023 12:04 PM [place] E wrote: Reason for CRM: Mylinda Asa from the [facility] pharmacy called in stating that the patient's insurance does not cover insulin  aspart (NOVOLOG ) 100 UNIT/ML injection, asking if this can get changed to Humalog  as that is covered. Callback number for Mylinda Asa is [phone] with any questions.

## 2023-12-21 NOTE — Progress Notes (Addendum)
 [facility] [address], [address] [place], [place] [address] [phone]  Date:  12/23/2023   Name:  Sean Horn   DOB:  14-Aug-1960   MRN:  [id number]  PCP:  Kaylee Partridge, MD    Chief Complaint: Hyperglycemia (Pt states his sugars seem to be better /Currently 117 )   History of Present Illness:  Sean Horn is a 63 y.o. very pleasant male patient who presents with the following:  Patient seen today for follow-up.  Most recent visit with myself was a virtual visit in February, I saw her in person last August.  Our visit in February had to be changed to virtual due to a snowstorm.  -history of TIA, SVT, end-stage arthritis of knees and hips, diabetes, hyperlipidemia, hypertension, CAD  He is under cardiology care Has been using Tylenol  No. 3 for joint pain  - At our visit in February I added trazodone  for insomnia  He recently reached out to me due to difficulty with his insulin  prescription.  He requested a refill of his short acting mealtime insulin  When we last discussed his diabetes in detail about a year ago he was under endocrinology care.  He was using Ozempic  and Lantus , and recent A1c was 7.1% He notes the copay for endocrinology was too high  He cannot work due to his orthopedic issues. He is planning to get a spinal stimulator soon which we hope will elp him He cannot afford ozempic  either; he has been out of this for a few months He does have medicare   Lab Results  Component Value Date   HGBA1C 7.1 (H) 12/17/2022   -Recommend COVID booster - Due for foot exam - Cologuard is due - Needs A1c update - Needs GFR and urine microalbumin -Recommend Shingrix  He uses a CGM- 117 this am   Patient Active Problem List   Diagnosis Date Noted   Hypothyroidism 09/05/2022   Unilateral primary osteoarthritis, left hip 01/30/2022   Syncope and collapse 01/22/2022   PSVT (paroxysmal supraventricular  tachycardia) (HCC) 12/12/2021   NSVT (nonsustained ventricular tachycardia) (HCC) 12/12/2021   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   Unilateral primary osteoarthritis, right hip 06/26/2021   Hyperbilirubinemia 11/15/2020   PONV (postoperative nausea and vomiting)    Type 2 diabetes mellitus without complication, with long-term current use of insulin  (HCC)    Chronic neck pain    Chronic headaches    CAD in native artery    Coronary artery disease involving native coronary artery of native heart without angina pectoris 11/06/2019   Diastolic dysfunction 11/06/2019   Non-ST elevation (NSTEMI) myocardial infarction (HCC) 06/26/2019   Tachycardia 06/25/2019   Hypertriglyceridemia 06/16/2019   Palpitations 06/12/2019   Morning headache 12/02/2018   Insomnia secondary to chronic pain 12/02/2018   Retrognathia 12/02/2018   TIA (transient ischemic attack) 11/03/2018   Cervical stenosis of spine 01/16/2018   Hyperlipidemia 07/19/2017   Obesity 01/30/2017   Urinary frequency 12/11/2016   Insomnia 09/13/2016   History of chicken pox    History of shingles    Preventative health care 08/14/2013   Anxiety    History of kidney stones    GERD (gastroesophageal reflux disease)    Essential hypertension, benign    Osteoarthritis    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    Class: Chronic    Past Medical History:  Diagnosis Date   Anxiety    CAD  in native artery    a. cath 06/26/2019 - 80% apical LAD >> medical management    Chronic headaches    Chronic neck pain    Coronary artery disease involving native coronary artery of native heart without angina pectoris 11/06/2019   GERD (gastroesophageal reflux disease)    occasional - OTC as needed   History of chicken pox    History of kidney stones    History of shingles    Hypertension    under control with med., had been on med. since age 12   Insomnia 09/13/2016   Insulin  dependent diabetes mellitus    Follows with endocrinology,  Dr Fulton Job reports his last hgba1c was 7.3    Non-insulin  dependent type 2 diabetes mellitus (HCC)    Non-ST elevation (NSTEMI) myocardial infarction (HCC) 06/26/2019   Obesity 01/30/2017   Occipital neuralgia    Osteoarthritis    right knee   PONV (postoperative nausea and vomiting)    PONV (postoperative nausea and vomiting)    Preventative health care 08/14/2013   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   And left now    SVT (supraventricular tachycardia) (HCC) 06/26/2019   Tachycardia    TIA (transient ischemic attack) 11/03/2018   Type 2 diabetes mellitus with complication, with long-term current use of insulin  (HCC)    Follows with endocrinology, Dr Fulton Job reports his last hgba1c was 7.3    Urinary frequency 12/11/2016    Past Surgical History:  Procedure Laterality Date   ANTERIOR CERVICAL DECOMP/DISCECTOMY FUSION N/A 01/16/2018   Procedure: ANTERIOR CERVICAL DECOMPRESSION/DISCECTOMY FUSION CERVICAL FOUR-FIVE, CERVICAL FIVE-SIX;  Surgeon: Augusto Blonder, MD;  Location: [facility] OR;  Service: Neurosurgery;  Laterality: N/A;  ANTERIOR CERVICAL DECOMPRESSION/DISCECTOMY FUSION CERVICAL FOUR-FIVE, CERVICAL FIVE-SIX   CHOLECYSTECTOMY  1980's   CYSTOSCOPY/RETROGRADE/URETEROSCOPY/STONE EXTRACTION WITH BASKET Right 08/27/2003   and double J stent placement   ELECTROPHYSIOLOGY STUDY N/A 07/23/2019   Procedure: ELECTROPHYSIOLOGY STUDY;  Surgeon: Tammie Fall, MD;  Location: [facility] INVASIVE CV LAB;  Service: Cardiovascular;  Laterality: N/A;   INGUINAL HERNIA REPAIR Left 1990's   KNEE ARTHROSCOPY Left 1980's   KNEE ARTHROSCOPY Right 2013   KNEE ARTHROSCOPY Right 06/30/2013   Procedure: RIGHT KNEE ARTHROSCOPY AND SYNOVECTOMY;  Surgeon: Alphonzo Ask, MD;  Location: [facility];  Service: Orthopedics;  Laterality: Right;   LEFT HEART CATH AND CORONARY ANGIOGRAPHY N/A 06/26/2019   Procedure: LEFT HEART CATH AND CORONARY ANGIOGRAPHY;  Surgeon: Arleen Lacer, MD;  Location: [facility] INVASIVE CV LAB;   Service: Cardiovascular;  Laterality: N/A;   LITHOTRIPSY     multiple, b/l   SHOULDER ADHESION RELEASE Left ` 1998   SHOULDER ARTHROSCOPY W/ ROTATOR CUFF REPAIR Left 10/20/2004   x 4, had to have part of clavile removed. torn rotator cuff multiple times, had to place screws    SHOULDER ARTHROSCOPY W/ SUPERIOR LABRAL ANTERIOR POSTERIOR LESION REPAIR Left 12/22/2004   SVT ABLATION N/A 07/23/2019   Procedure: SVT ABLATION;  Surgeon: Tammie Fall, MD;  Location: [facility] INVASIVE CV LAB;  Service: Cardiovascular;  Laterality: N/A;   TOTAL HIP ARTHROPLASTY Right 10/10/2021   Procedure: Right TOTAL HIP ARTHROPLASTY ANTERIOR APPROACH;  Surgeon: Arnie Lao, MD;  Location: [facility] OR;  Service: Orthopedics;  Laterality: Right;   TOTAL KNEE ARTHROPLASTY Right 12/23/2012   x3, 2 arthroscopies and a TKR,  TOTAL KNEE ARTHROPLASTY;  Surgeon: Alphonzo Ask, MD;  Location: [facility] OR;  Service: Orthopedics;  Laterality: Right;  DEPUY, RNFA   WRIST SURGERY Right  x 3, torn ligaments, pins placed then infected, then fused with plate    Social History   Tobacco Use   Smoking status: Never   Smokeless tobacco: Current    Types: Snuff  Vaping Use   Vaping status: Never Used  Substance Use Topics   Alcohol use: No   Drug use: No    Family History  Problem Relation Age of Onset   COPD Mother        smoker   Hypertension Mother    Heart disease Mother        CHF   Diabetes Father    Heart disease Father        CABG in 46's   Stroke Father    Hypertension Father    Peripheral vascular disease Father    Hypertension Son        tachycardia   Heart disease Maternal Grandfather    Arthritis Paternal Grandmother    Colon cancer Neg Hx    Rectal cancer Neg Hx    Stomach cancer Neg Hx     Allergies  Allergen Reactions   Rosuvastatin  Other (See Comments)    Joint pain   Toradol [Ketorolac Tromethamine] Itching    Medication list has been reviewed and updated.  Current Outpatient  Medications on File Prior to Visit  Medication Sig Dispense Refill   acetaminophen  (TYLENOL ) 500 MG tablet Take 1,000-1,500 mg by mouth 2 (two) times daily as needed for moderate pain.     acetaminophen -codeine  (TYLENOL  #3) 300-30 MG tablet Take 1-2 tablets by mouth every 8 (eight) hours as needed for moderate pain (pain score 4-6). 30 tablet 2   aspirin  EC 325 MG tablet Take by mouth.     carvedilol  (COREG ) 6.25 MG tablet Take 1 tablet (6.25 mg total) by mouth 2 (two) times daily with a meal. 180 tablet 3   clopidogrel  (PLAVIX ) 75 MG tablet Take 1 tablet by mouth once daily with breakfast 90 tablet 3   escitalopram  (LEXAPRO ) 20 MG tablet Take 1 tablet by mouth once daily 90 tablet 1   fenofibrate  160 MG tablet Take by mouth.     flecainide  (TAMBOCOR ) 50 MG tablet Take 1 tablet (50 mg total) by mouth 2 (two) times daily. 180 tablet 3   HYDROcodone -acetaminophen  (NORCO/VICODIN) 5-325 MG tablet Take 1 tablet by mouth every 6 (six) hours as needed for moderate pain. 30 tablet 0   insulin  glargine (LANTUS  SOLOSTAR) 100 UNIT/ML Solostar Pen Inject 60 Units into the skin at bedtime. 30 mL 1   insulin  lispro (HUMALOG ) 100 UNIT/ML injection Inject 20 units three times daily with meals 10 mL 11   isosorbide  mononitrate (IMDUR ) 30 MG 24 hr tablet Take 1 tablet (30 mg total) by mouth daily. TAKE 1 TABLET BY MOUTH ONCE DAILY 90 tablet 0   lansoprazole (PREVACID) 30 MG capsule Take by mouth.     levothyroxine  (SYNTHROID ) 25 MCG tablet TAKE 1 TABLET BY MOUTH ONCE DAILY BEFORE BREAKFAST 90 tablet 1   metFORMIN  (GLUMETZA ) 1000 MG (MOD) 24 hr tablet Take 1 tablet (1,000 mg total) by mouth daily with breakfast. TAKE 2 TABLETS BY MOUTH TWICE DAILY WITH MEALS 180 tablet 1   metoprolol  succinate (TOPROL -XL) 50 MG 24 hr tablet Take 1 tablet (50 mg total) by mouth daily. Take with or immediately following a meal. 90 tablet 4   nitroGLYCERIN  (NITROSTAT ) 0.4 MG SL tablet Place 1 tablet (0.4 mg total) under the tongue every  5 (five) minutes as needed. 25  tablet 12   omeprazole  (PRILOSEC) 20 MG capsule Take 1 capsule (20 mg total) by mouth at bedtime. 90 capsule 0   rOPINIRole  (REQUIP ) 0.5 MG tablet TAKE 2 TABLETS BY MOUTH AT BEDTIME 180 tablet 0   terbinafine  (LAMISIL ) 250 MG tablet Take 1 tablet (250 mg total) by mouth daily. 90 tablet 0   traZODone  (DESYREL ) 50 MG tablet Take 0.5-1 tablets (25-50 mg total) by mouth at bedtime as needed for sleep. 30 tablet 3   No current facility-administered medications on file prior to visit.    Review of Systems:  As per HPI- otherwise negative.  Wt Readings from Last 3 Encounters:  12/23/23 216 lb 3.2 oz (98.1 kg)  08/15/23 223 lb (101.2 kg)  03/28/23 217 lb 9.6 oz (98.7 kg)      Physical Examination: Vitals:   12/23/23 1022 12/23/23 1233  BP: (!) 142/88 120/80  Pulse: 82   SpO2: 99%    Vitals:   12/23/23 1022  Weight: 216 lb 3.2 oz (98.1 kg)  Height: 6' (1.829 m)   Body mass index is 29.32 kg/m. Ideal Body Weight: Weight in (lb) to have BMI = 25: 183.9  GEN: no acute distress.  Overweight, looks well  HEENT: Atraumatic, Normocephalic.  Ears and Nose: No external deformity. CV: RRR, No M/G/R. No JVD. No thrill. No extra heart sounds. PULM: CTA B, no wheezes, crackles, rhonchi. No retractions. No resp. distress. No accessory muscle use. ABD: S, NT, ND, +BS. No rebound. No HSM. EXTR: No c/c/e PSYCH: Normally interactive. Conversant.  Foot exam normal   Assessment and Plan: Type 2 diabetes mellitus with complication, with long-term current use of insulin  (HCC) - Plan: Comprehensive metabolic panel with GFR, Hemoglobin A1c, Microalbumin / creatinine urine ratio, Semaglutide ,0.25 or 0.5MG /DOS, (OZEMPIC , 0.25 OR 0.5 MG/DOSE,) 2 MG/3ML SOPN, AMB Referral VBCI Care Management  Primary insomnia  Screening for prostate cancer - Plan: PSA  Mixed hyperlipidemia - Plan: Lipid panel, atorvastatin  (LIPITOR) 40 MG tablet  Essential hypertension - Plan: CBC,  Comprehensive metabolic panel with GFR  Colon cancer screening - Plan: Cologuard Pt with history of difficult to control diabetes- some compliance issues related to difficulty affording medication BP well controlled On statin It was good to see you today, I will be in touch with your lab work. Recommend staying up-to-date on your COVID boosters, and also Shingrix vaccine series at your pharmacy  We can get you back on Ozempic - 0.25 mg dose for 4 weeks, then increase to 0.5 mg I will get you set up with Cecilie Coffee- our pharmacist- for further assistance!    Signed Gates Kasal, MD  Addendum 5/20, received labs as below.  Message to patient  Results for orders placed or performed in visit on 12/23/23  CBC   Collection Time: 12/23/23 10:49 AM  Result Value Ref Range   WBC 6.8 4.0 - 10.5 K/uL   RBC 4.88 4.22 - 5.81 Mil/uL   Platelets 289.0 150.0 - 400.0 K/uL   Hemoglobin 15.2 13.0 - 17.0 g/dL   HCT 16.1 09.6 - 04.5 %   MCV 90.1 78.0 - 100.0 fl   MCHC 34.5 30.0 - 36.0 g/dL   RDW 40.9 81.1 - 91.4 %  Comprehensive metabolic panel with GFR   Collection Time: 12/23/23 10:49 AM  Result Value Ref Range   Sodium 137 135 - 145 mEq/L   Potassium 4.2 3.5 - 5.1 mEq/L   Chloride 101 96 - 112 mEq/L   CO2 29 19 - 32  mEq/L   Glucose, Bld 73 70 - 99 mg/dL   BUN 10 6 - 23 mg/dL   Creatinine, Ser 4.09 0.40 - 1.50 mg/dL   Total Bilirubin 1.1 0.2 - 1.2 mg/dL   Alkaline Phosphatase 68 39 - 117 U/L   AST 16 0 - 37 U/L   ALT 13 0 - 53 U/L   Total Protein 6.6 6.0 - 8.3 g/dL   Albumin 4.2 3.5 - 5.2 g/dL   GFR 81.19 >14.78 mL/min   Calcium  9.2 8.4 - 10.5 mg/dL  Hemoglobin G9F   Collection Time: 12/23/23 10:49 AM  Result Value Ref Range   Hgb A1c MFr Bld 12.9 (H) 4.6 - 6.5 %  Lipid panel   Collection Time: 12/23/23 10:49 AM  Result Value Ref Range   Cholesterol 209 (H) 0 - 200 mg/dL   Triglycerides 621.3 (H) 0.0 - 149.0 mg/dL   HDL 08.65 >78.46 mg/dL   VLDL 96.2 (H) 0.0 - 95.2 mg/dL    LDL Cholesterol 841 (H) 0 - 99 mg/dL   Total CHOL/HDL Ratio 5    NonHDL 169.54   Microalbumin / creatinine urine ratio   Collection Time: 12/23/23 10:49 AM  Result Value Ref Range   Microalb, Ur 3.2 (H) 0.0 - 1.9 mg/dL   Creatinine,U 32.4 mg/dL   Microalb Creat Ratio 47.7 (H) 0.0 - 30.0 mg/g  PSA   Collection Time: 12/23/23 10:49 AM  Result Value Ref Range   PSA 1.04 0.10 - 4.00 ng/mL

## 2023-12-21 NOTE — Patient Instructions (Addendum)
 It was good to see you today, I will be in touch with your lab work. Recommend staying up-to-date on your COVID boosters, and also Shingrix vaccine series at your pharmacy  We can get you back on Ozempic - 0.25 mg dose for 4 weeks, then increase to 0.5 mg I will get you set up with Cecilie Coffee- our pharmacist- for further assistance!    Take care

## 2023-12-23 ENCOUNTER — Ambulatory Visit (INDEPENDENT_AMBULATORY_CARE_PROVIDER_SITE_OTHER): Admitting: Family Medicine

## 2023-12-23 ENCOUNTER — Encounter: Payer: Self-pay | Admitting: Family Medicine

## 2023-12-23 VITALS — BP 120/80 | HR 82 | Ht 72.0 in | Wt 216.2 lb

## 2023-12-23 DIAGNOSIS — E782 Mixed hyperlipidemia: Secondary | ICD-10-CM

## 2023-12-23 DIAGNOSIS — I10 Essential (primary) hypertension: Secondary | ICD-10-CM

## 2023-12-23 DIAGNOSIS — Z125 Encounter for screening for malignant neoplasm of prostate: Secondary | ICD-10-CM

## 2023-12-23 DIAGNOSIS — F5101 Primary insomnia: Secondary | ICD-10-CM | POA: Diagnosis not present

## 2023-12-23 DIAGNOSIS — Z794 Long term (current) use of insulin: Secondary | ICD-10-CM

## 2023-12-23 DIAGNOSIS — Z1211 Encounter for screening for malignant neoplasm of colon: Secondary | ICD-10-CM

## 2023-12-23 DIAGNOSIS — E118 Type 2 diabetes mellitus with unspecified complications: Secondary | ICD-10-CM

## 2023-12-23 LAB — CBC
HCT: 44 % (ref 39.0–52.0)
Hemoglobin: 15.2 g/dL (ref 13.0–17.0)
MCHC: 34.5 g/dL (ref 30.0–36.0)
MCV: 90.1 fl (ref 78.0–100.0)
Platelets: 289 10*3/uL (ref 150.0–400.0)
RBC: 4.88 Mil/uL (ref 4.22–5.81)
RDW: 13.8 % (ref 11.5–15.5)
WBC: 6.8 10*3/uL (ref 4.0–10.5)

## 2023-12-23 LAB — COMPREHENSIVE METABOLIC PANEL WITH GFR
ALT: 13 U/L (ref 0–53)
AST: 16 U/L (ref 0–37)
Albumin: 4.2 g/dL (ref 3.5–5.2)
Alkaline Phosphatase: 68 U/L (ref 39–117)
BUN: 10 mg/dL (ref 6–23)
CO2: 29 meq/L (ref 19–32)
Calcium: 9.2 mg/dL (ref 8.4–10.5)
Chloride: 101 meq/L (ref 96–112)
Creatinine, Ser: 0.77 mg/dL (ref 0.40–1.50)
GFR: 95.99 mL/min (ref >60.00)
Glucose, Bld: 73 mg/dL (ref 70–99)
Potassium: 4.2 meq/L (ref 3.5–5.1)
Sodium: 137 meq/L (ref 135–145)
Total Bilirubin: 1.1 mg/dL (ref 0.2–1.2)
Total Protein: 6.6 g/dL (ref 6.0–8.3)

## 2023-12-23 LAB — LIPID PANEL
Cholesterol: 209 mg/dL — ABNORMAL HIGH (ref 0–200)
HDL: 39.2 mg/dL (ref >39.00)
LDL Cholesterol: 117 mg/dL — ABNORMAL HIGH (ref 0–99)
NonHDL: 169.54
Total CHOL/HDL Ratio: 5
Triglycerides: 261 mg/dL — ABNORMAL HIGH (ref 0.0–149.0)
VLDL: 52.2 mg/dL — ABNORMAL HIGH (ref 0.0–40.0)

## 2023-12-23 LAB — MICROALBUMIN / CREATININE URINE RATIO
Creatinine,U: 66.5 mg/dL
Microalb Creat Ratio: 47.7 mg/g — ABNORMAL HIGH (ref 0.0–30.0)
Microalb, Ur: 3.2 mg/dL — ABNORMAL HIGH (ref 0.0–1.9)

## 2023-12-23 LAB — HEMOGLOBIN A1C: Hgb A1c MFr Bld: 12.9 % — ABNORMAL HIGH (ref 4.6–6.5)

## 2023-12-23 MED ORDER — ATORVASTATIN CALCIUM 40 MG PO TABS: 40.0000 mg | ORAL_TABLET | Freq: Every day | ORAL | 3 refills | Status: AC

## 2023-12-23 MED ORDER — OZEMPIC (0.25 OR 0.5 MG/DOSE) 2 MG/3ML ~~LOC~~ SOPN: PEN_INJECTOR | SUBCUTANEOUS | Status: DC

## 2023-12-24 ENCOUNTER — Encounter: Payer: Self-pay | Admitting: Family Medicine

## 2023-12-24 DIAGNOSIS — E1129 Type 2 diabetes mellitus with other diabetic kidney complication: Secondary | ICD-10-CM

## 2023-12-24 LAB — PSA: PSA: 1.04 ng/mL (ref 0.10–4.00)

## 2023-12-25 MED ORDER — LOSARTAN POTASSIUM 25 MG PO TABS: 25.0000 mg | ORAL_TABLET | Freq: Every day | ORAL | 3 refills | Status: AC

## 2023-12-25 NOTE — Addendum Note (Signed)
 Addended by: Gates Kasal C on: 12/25/2023 12:10 PM   Modules accepted: Orders

## 2023-12-31 ENCOUNTER — Telehealth: Payer: Self-pay

## 2023-12-31 NOTE — Progress Notes (Signed)
 Complex Care Management Note  Care Guide Note 12/31/2023 Name: Sean Horn MRN: [id number] DOB: April 21, 1961  Sean Horn is a 63 y.o. year old male who sees Copland, Skipper Dumas, MD for primary care. I reached out to Harvie Liner by phone today to offer complex care management services.  [name] was given information about Complex Care Management services today including:   The Complex Care Management services include support from the care team which includes your Nurse Care Manager, Clinical Social Worker, or Pharmacist.  The Complex Care Management team is here to help remove barriers to the health concerns and goals most important to you. Complex Care Management services are voluntary, and the patient may decline or stop services at any time by request to their care team member.   Complex Care Management Consent Status: Patient agreed to services and verbal consent obtained.   Follow up plan:  Telephone appointment with complex care management team member scheduled for:  01/06/24 at 2:00 p.m.   Encounter Outcome:  Patient Scheduled  [facility]  [facility], [facility] Health Care Management Assistant Direct Dial: [phone]  Fax: [phone]

## 2023-12-31 NOTE — Progress Notes (Signed)
 Complex Care Management Note Care Guide Note  12/31/2023 Name: Sean Horn MRN: [id number] DOB: 1960/09/23   Complex Care Management Outreach Attempts: An unsuccessful telephone outreach was attempted today to offer the patient information about available complex care management services.  Follow Up Plan:  Additional outreach attempts will be made to offer the patient complex care management information and services.   Encounter Outcome:  No Answer  [facility]  [facility], [facility] Health Care Management Assistant Direct Dial: [phone]  Fax: [phone]

## 2024-01-06 ENCOUNTER — Ambulatory Visit (INDEPENDENT_AMBULATORY_CARE_PROVIDER_SITE_OTHER): Admitting: Pharmacist

## 2024-01-06 ENCOUNTER — Encounter: Payer: Self-pay | Admitting: Pharmacist

## 2024-01-06 DIAGNOSIS — I214 Non-ST elevation (NSTEMI) myocardial infarction: Secondary | ICD-10-CM

## 2024-01-06 DIAGNOSIS — T50905A Adverse effect of unspecified drugs, medicaments and biological substances, initial encounter: Secondary | ICD-10-CM

## 2024-01-06 DIAGNOSIS — E118 Type 2 diabetes mellitus with unspecified complications: Secondary | ICD-10-CM

## 2024-01-06 DIAGNOSIS — I2510 Atherosclerotic heart disease of native coronary artery without angina pectoris: Secondary | ICD-10-CM

## 2024-01-06 DIAGNOSIS — Z794 Long term (current) use of insulin: Secondary | ICD-10-CM

## 2024-01-06 MED ORDER — ISOSORBIDE MONONITRATE ER 30 MG PO TB24: 30.0000 mg | ORAL_TABLET | Freq: Every day | ORAL | 0 refills | Status: AC

## 2024-01-06 MED ORDER — PANTOPRAZOLE SODIUM 40 MG PO TBEC
40.0000 mg | DELAYED_RELEASE_TABLET | Freq: Every day | ORAL | 0 refills | Status: DC
Start: 2024-01-06 — End: 2024-04-13

## 2024-01-06 NOTE — Progress Notes (Signed)
 01/06/2024 Name: NAITIK HERMANN MRN: [id number] DOB: 1960-12-21  Chief Complaint  Patient presents with   Diabetes   Medication Management    Sean Horn is a 63 y.o. year old male who presented for a telephone visit.   They were referred to the pharmacist by their PCP for assistance in managing diabetes and medication access.    Subjective:  Care Team: Primary Care Provider: Kaylee Partridge, MD ; Next Scheduled Visit: not currently scheduled  Medication Access/Adherence  Current Pharmacy:  [facility] [address], [place] - [address] [place] [address] Phone: [phone] Fax: [phone]   Patient reports affordability concerns with their medications: Yes  Patient reports access/transportation concerns to their pharmacy: No  Patient reports adherence concerns with their medications:  Yes  - was not taking Ozempic  due to cost   Diabetes:  Current medications: ozempic  0.25mg  weekly for 4 weeks, then 0.5mg  weekly - he has 1 more week of 0.25mg . Lantus  60 units once a day and Humalog  20 units 3 times a day with meals.  Medications tried in the past: metformin  - has GI side effects / diarrhea.    Using DexCom G7 sensor / Continuous Glucose Monitor system.     Date of Download: 01/06/2024 % Time CGM is active: 87% Average Glucose: 187 mg/dL Glucose Management Indicator: 7.8  Glucose Variability: 26.3% (goal <36%) Time in Goal:  - Time in range 70-180: 47% - Time above range: 53% - Time below range: 0% - patient states he has had 2 reading below 70 in the last 2 weeks but is not showing on his reports.   Observed patterns: Blood glucose is improved with restart of Ozempic .   Patient reports hypoglycemic s/sx including awaking at night feeling shaky and sweating. Patient denies hyperglycemic symptoms including no polyuria, polydipsia, polyphagia, nocturia, neuropathy, blurred vision.  Current medication access  support: none   Medication Management: Patient has isosorbide  on his med list but has not filled in several months. He was not sure if he was to continue. Reviewed last OV from Cardiology and they mentioned continuing isosorbide .   Objective:  Lab Results  Component Value Date   HGBA1C 12.9 (H) 12/23/2023    Lab Results  Component Value Date   CREATININE 0.77 12/23/2023   BUN 10 12/23/2023   NA 137 12/23/2023   K 4.2 12/23/2023   CL 101 12/23/2023   CO2 29 12/23/2023    Lab Results  Component Value Date   CHOL 209 (H) 12/23/2023   HDL 39.20 12/23/2023   LDLCALC 117 (H) 12/23/2023   LDLDIRECT 55.0 06/05/2021   TRIG 261.0 (H) 12/23/2023   CHOLHDL 5 12/23/2023    Medications Reviewed Today     Reviewed by Cecilie Coffee, RPH-CPP (Pharmacist) on 01/06/24 at 1417  Med List Status: <None>   Medication Order Taking? Sig Documenting Provider Last Dose Status Informant  acetaminophen  (TYLENOL ) 500 MG tablet [id number] Yes Take 1,000-1,500 mg by mouth 2 (two) times daily as needed for moderate pain. [provider] Taking Active Self  acetaminophen -codeine  (TYLENOL  #3) 300-30 MG tablet [id number] Yes Take 1-2 tablets by mouth every 8 (eight) hours as needed for moderate pain (pain score 4-6). Copland, Skipper Dumas, MD Taking Active            Med Note Claretha Crocker Jan 06, 2024  2:05 PM) Usually only takes at night  atorvastatin  (LIPITOR) 40 MG tablet [id number] Yes  Take 1 tablet (40 mg total) by mouth at bedtime. Copland, Skipper Dumas, MD Taking Active   clopidogrel  (PLAVIX ) 75 MG tablet [id number] Yes Take 1 tablet by mouth once daily with breakfast Tobb, Kardie, DO Taking Active   escitalopram  (LEXAPRO ) 20 MG tablet [id number] Yes Take 1 tablet by mouth once daily Copland, Jessica C, MD Taking Active   flecainide  (TAMBOCOR ) 50 MG tablet [id number] Yes Take 1 tablet (50 mg total) by mouth 2 (two) times daily. Tania Familia, NP Taking Active   insulin  glargine (LANTUS   SOLOSTAR) 100 UNIT/ML Solostar Pen [id number] Yes Inject 60 Units into the skin at bedtime. Copland, Skipper Dumas, MD Taking Active   insulin  lispro (HUMALOG ) 100 UNIT/ML injection [id number] Yes Inject 20 units three times daily with meals Copland, Skipper Dumas, MD Taking Active   isosorbide  mononitrate (IMDUR ) 30 MG 24 hr tablet [id number] No Take 1 tablet (30 mg total) by mouth daily. TAKE 1 TABLET BY MOUTH ONCE DAILY Tania Familia, NP Unknown Active   levothyroxine  (SYNTHROID ) 25 MCG tablet [id number] Yes TAKE 1 TABLET BY MOUTH ONCE DAILY BEFORE BREAKFAST Copland, Skipper Dumas, MD Taking Active   losartan  (COZAAR ) 25 MG tablet [id number] Yes Take 1 tablet (25 mg total) by mouth daily. Copland, Skipper Dumas, MD Taking Active   metoprolol  succinate (TOPROL -XL) 50 MG 24 hr tablet [id number] Yes Take 1 tablet (50 mg total) by mouth daily. Take with or immediately following a meal. Tobb, Kardie, DO Taking Active   nitroGLYCERIN  (NITROSTAT ) 0.4 MG SL tablet [id number] Yes Place 1 tablet (0.4 mg total) under the tongue every 5 (five) minutes as needed. Tobb, Kardie, DO Taking Active   omeprazole  (PRILOSEC) 20 MG capsule [id number] Yes Take 1 capsule (20 mg total) by mouth at bedtime. Copland, Skipper Dumas, MD Taking Active   rOPINIRole  (REQUIP ) 0.5 MG tablet [id number] No TAKE 2 TABLETS BY MOUTH AT BEDTIME  Patient not taking: Reported on 01/06/2024   Copland, Skipper Dumas, MD Not Taking Active   Semaglutide ,0.25 or 0.5MG /DOS, (OZEMPIC , 0.25 OR 0.5 MG/DOSE,) 2 MG/3ML SOPN [id number] Yes Inject 0.25 mg weekly for 4 weeks, then go up to 0.5 mg weekly Copland, Jessica C, MD Taking Active   traZODone  (DESYREL ) 50 MG tablet [id number] Yes Take 0.5-1 tablets (25-50 mg total) by mouth at bedtime as needed for sleep. Copland, Skipper Dumas, MD Taking Active               Assessment/Plan:   Diabetes:Currently uncontrolled per A1c but home blood glucose per Continuous Glucose Monitor shows improvement since restarting Ozempic  -  Reviewed goal A1c, goal fasting, and goal 2 hour post prandial glucose - Continue Lantus  and Humalog  currently but plan to change to [place] and Novolog  for medication assistance program.  - Recommend to continue as planned to increase Ozempic  to 0.5mg  weekly next week. Will plan to follow up with patient in 2 weeks and adjust insulin  if needed. He will call earlier if he has blood glucose < 70.   - Recommend to check glucose with Dex Com G7 - Meets financial criteria for Ozempic  / Novolog  / [place] patient assistance program through [employer]. Will collaborate with provider, CPhT, and patient to pursue assistance.   Medication Management: - discussed reasons for taking isosorbide . Patient will restart. Rx refill requested form his pharmacy. Monitor for dizziness and headache.  - Noted interaction between clopidogrel  and omeprazole . Will change omeprazole  to pantoprazole  40mg  once daily.   Follow Up Plan: 2 weeks  Cecilie Coffee, PharmD  Clinical Pharmacist [facility]

## 2024-01-07 ENCOUNTER — Telehealth: Payer: Self-pay

## 2024-01-07 NOTE — Telephone Encounter (Signed)
 PAP: Patient assistance application for Novolog , Ozempic , and Tresiba through [employer] has been mailed to [employer] home address on file. Provider portion of application will be faxed to provider's office.

## 2024-01-15 NOTE — Telephone Encounter (Signed)
 Application for [employer] has been review and signed by PCP. Faxed back to Med Assist Team attn Palmer Bobo today.

## 2024-01-16 NOTE — Telephone Encounter (Signed)
 Follow up with patient regarding Pap application with [employer]- left HIPAA compliant v/m to see if application was received in the mail.

## 2024-01-17 DIAGNOSIS — Z1211 Encounter for screening for malignant neoplasm of colon: Secondary | ICD-10-CM | POA: Diagnosis not present

## 2024-01-20 ENCOUNTER — Telehealth: Payer: Self-pay | Admitting: Pharmacist

## 2024-01-20 ENCOUNTER — Ambulatory Visit (INDEPENDENT_AMBULATORY_CARE_PROVIDER_SITE_OTHER): Admitting: Pharmacist

## 2024-01-20 ENCOUNTER — Other Ambulatory Visit: Payer: Self-pay | Admitting: Family Medicine

## 2024-01-20 DIAGNOSIS — E118 Type 2 diabetes mellitus with unspecified complications: Secondary | ICD-10-CM

## 2024-01-20 DIAGNOSIS — F5101 Primary insomnia: Secondary | ICD-10-CM

## 2024-01-20 DIAGNOSIS — Z794 Long term (current) use of insulin: Secondary | ICD-10-CM

## 2024-01-20 NOTE — Progress Notes (Signed)
 01/20/2024 Name: Sean Horn MRN: [id number] DOB: 1960/08/08  Chief Complaint  Patient presents with   Medication Management   Diabetes    Sean Horn is a 63 y.o. year old male who presented for a telephone visit.   They were referred to the pharmacist by their PCP for assistance in managing diabetes and medication access.    Subjective:  Care Team: Primary Care Provider: Kaylee Partridge, MD ; Next Scheduled Visit: not currently scheduled  Medication Access/Adherence  Current Pharmacy:  [facility] [address] [place] [address] [place] [address] Phone: [phone] Fax: [phone]   Patient reports affordability concerns with their medications: Yes  Patient reports access/transportation concerns to their pharmacy: No  Patient reports adherence concerns with their medications:  Yes  - was not taking Ozempic  due to cost   Diabetes:  Current medications:  Ozempic  0.5mg  weekly (restarted Oempic about 5 weeks ago - has had 1 dose of 0.5mg ) Lantus  60 units once a day Humalog  20 units 3 times a day with meals. (Reports he is taking after meals)  Medications tried in the past: metformin  - has GI side effects / diarrhea.   Using DexCom G7 sensor / Continuous Glucose Monitor system.    Date of Download: 01/20/2024 % Time CGM is active: 84% Average Glucose: 197 mg/dL  Glucose Management Indicator: 8.0% Glucose Variability: 30.5% (goal <36%) Time in Goal:  - Time in range 70-180: 41% - Time above range: 58% - Time below range: 1%  - Time belwo 55: 0%  Observed patterns: Blood glucose is improved with restart of Ozempic .    Patient reports denies recent hypoglycemic s/sx including no Dizziness, shakiness or sweating. Patient denies hyperglycemic symptoms including no polyuria, polydipsia, polyphagia, nocturia, neuropathy, blurred vision.  Current medication access support: He received application for Novo  Nord[employer] 01/18/2024, compelted and mailed back to Med Assist Team today.   Objective:  Lab Results  Component Value Date   HGBA1C 12.9 (H) 12/23/2023    Lab Results  Component Value Date   CREATININE 0.77 12/23/2023   BUN 10 12/23/2023   NA 137 12/23/2023   K 4.2 12/23/2023   CL 101 12/23/2023   CO2 29 12/23/2023    Lab Results  Component Value Date   CHOL 209 (H) 12/23/2023   HDL 39.20 12/23/2023   LDLCALC 117 (H) 12/23/2023   LDLDIRECT 55.0 06/05/2021   TRIG 261.0 (H) 12/23/2023   CHOLHDL 5 12/23/2023    Medications Reviewed Today     Reviewed by Cecilie Coffee, RPH-CPP (Pharmacist) on 01/20/24 at 1439  Med List Status: <None>   Medication Order Taking? Sig Documenting Provider Last Dose Status Informant  acetaminophen  (TYLENOL ) 500 MG tablet [id number] Yes Take 1,000-1,500 mg by mouth 2 (two) times daily as needed for moderate pain. [provider]  Active Self  acetaminophen -codeine  (TYLENOL  #3) 300-30 MG tablet [id number] Yes Take 1-2 tablets by mouth every 8 (eight) hours as needed for moderate pain (pain score 4-6). Copland, Skipper Dumas, MD  Active            Med Note Claretha Crocker Jan 06, 2024  2:05 PM) Usually only takes at night  atorvastatin  (LIPITOR) 40 MG tablet [id number] Yes Take 1 tablet (40 mg total) by mouth at bedtime. Copland, Skipper Dumas, MD  Active   clopidogrel  (PLAVIX ) 75 MG tablet [id number] Yes Take 1 tablet by mouth once daily with breakfast Tobb,  Kardie, DO  Active   Continuous Glucose Sensor (DEXCOM G7 SENSOR) MISC [id number] Yes 1 each by Does not apply route. Change every 10 days [provider]  Active   escitalopram  (LEXAPRO ) 20 MG tablet [id number] Yes Take 1 tablet by mouth once daily Copland, Jessica C, MD  Active   flecainide  (TAMBOCOR ) 50 MG tablet [id number] Yes Take 1 tablet (50 mg total) by mouth 2 (two) times daily. Tania Familia, NP  Active   insulin  glargine (LANTUS  SOLOSTAR) 100 UNIT/ML Solostar Pen [id number]  Yes Inject 60 Units into the skin at bedtime. Copland, Skipper Dumas, MD  Active   insulin  lispro (HUMALOG ) 100 UNIT/ML injection [id number] Yes Inject 20 units three times daily with meals Copland, Skipper Dumas, MD  Active   isosorbide  mononitrate (IMDUR ) 30 MG 24 hr tablet [id number] Yes Take 1 tablet (30 mg total) by mouth daily. Copland, Skipper Dumas, MD  Active   levothyroxine  (SYNTHROID ) 25 MCG tablet [id number]  TAKE 1 TABLET BY MOUTH ONCE DAILY BEFORE BREAKFAST Copland, Skipper Dumas, MD  Active   losartan  (COZAAR ) 25 MG tablet [id number] Yes Take 1 tablet (25 mg total) by mouth daily. Copland, Jessica C, MD  Active   metoprolol  succinate (TOPROL -XL) 50 MG 24 hr tablet [id number] Yes Take 1 tablet (50 mg total) by mouth daily. Take with or immediately following a meal. Tobb, Kardie, DO  Active   nitroGLYCERIN  (NITROSTAT ) 0.4 MG SL tablet [id number] Yes Place 1 tablet (0.4 mg total) under the tongue every 5 (five) minutes as needed. Tobb, Kardie, DO  Active   pantoprazole  (PROTONIX ) 40 MG tablet [id number] Yes Take 1 tablet (40 mg total) by mouth daily. Copland, Skipper Dumas, MD  Active   rOPINIRole  (REQUIP ) 0.5 MG tablet [id number]  TAKE 2 TABLETS BY MOUTH AT BEDTIME  Patient not taking: Reported on 01/20/2024   Copland, Skipper Dumas, MD  Active   Semaglutide ,0.25 or 0.5MG /DOS, (OZEMPIC , 0.25 OR 0.5 MG/DOSE,) 2 MG/3ML SOPN [id number] Yes Inject 0.25 mg weekly for 4 weeks, then go up to 0.5 mg weekly  Patient taking differently: Inject 0.5 mg into the skin once a week. Inject 0.25 mg weekly for 4 weeks, then go up to 0.5 mg weekly   Copland, Jessica C, MD  Active   traZODone  (DESYREL ) 50 MG tablet [id number] Yes Take 0.5-1 tablets (25-50 mg total) by mouth at bedtime as needed for sleep. Copland, Skipper Dumas, MD  Active               Assessment/Plan:   Diabetes:Currently uncontrolled per A1c but home blood glucose per Continuous Glucose Monitor shows improvement since restarting Ozempic  - Reviewed goal A1c, goal  fasting, and goal 2 hour post prandial glucose - Continue Lantus  and Humalog  currently but plan to change to [place] and Novolog  for medication assistance program.  - Recommend to continue Ozempic  to 0.5mg  weekly - Recommend to check glucose with Dex Com G7 - Meets financial criteria for Ozempic  / Novolog  / [place] patient assistance program through [employer]. Will collaborate with provider, CPhT, and patient to pursue assistance.   Follow Up Plan: 2 to 3 weeks  Cecilie Coffee, PharmD Clinical Pharmacist [facility]

## 2024-01-20 NOTE — Telephone Encounter (Signed)
 Attempt was made to contact patient by phone today for follow up by Clinical Pharmacist regarding diabetes management and medication access.  Unable to reach patient. LM on VM with my contact number [phone] and main office nubmer [phone]

## 2024-01-22 ENCOUNTER — Other Ambulatory Visit: Payer: Self-pay | Admitting: Family Medicine

## 2024-01-22 DIAGNOSIS — M1711 Unilateral primary osteoarthritis, right knee: Secondary | ICD-10-CM

## 2024-01-24 NOTE — Telephone Encounter (Signed)
 PAP: Application for Novolog , Ozempic , and Horace Lye has been submitted to [employer], via fax

## 2024-01-25 LAB — COLOGUARD: COLOGUARD: NEGATIVE

## 2024-01-26 ENCOUNTER — Encounter: Payer: Self-pay | Admitting: Family Medicine

## 2024-01-29 NOTE — Telephone Encounter (Signed)
 PAP: Patient assistance application for Novolog , Ozempic , and [place] has been approved by PAP Companies: [employer] from 01/28/2024 to 08/05/2024. Medication should be delivered to PAP Delivery: Provider's office. For further shipping updates, please contact [employer] at [phone]. Patient ID is: [id number]

## 2024-02-03 ENCOUNTER — Encounter: Payer: Self-pay | Admitting: Pharmacist

## 2024-02-04 NOTE — Progress Notes (Signed)
 02/04/2024 Name: Sean Horn MRN: [id number] DOB: 09/30/1960  Chief Complaint  Patient presents with   Medication Management   Diabetes    Sean Horn is a 63 y.o. year old male who presented for a My Chart Visit   They were referred to the pharmacist by their PCP for assistance in managing diabetes and medication access.    Subjective:  Care Team: Primary Care Provider: Watt Harlene BROCKS, MD ; Next Scheduled Visit: not currently scheduled  Medication Access/Adherence  Patient was approved to receive Novolog , Ozempic  and insulin  degludec [name]) from [employer] patient assistance program 01/27/2024 but he has not received first shipment yet.   Current Pharmacy:  [facility] [address] [place] - [address] [place] [address] Phone: [phone] Fax: [phone]   Patient reports affordability concerns with their medications: Yes  Patient reports access/transportation concerns to their pharmacy: No  Patient reports adherence concerns with their medications:  Yes     Diabetes:  Current medications:  Ozempic  0.5mg  weekly (restarted Oempic about 6-8 weeks ago) Lantus  60 units once a day Humalog  20 units 3 times a day with meals.   Medications tried in the past: metformin  - has GI side effects / diarrhea.   Using DexCom G7 sensor / Continuous Glucose Monitor system.   Date of Download: 02/04/2024 % Time CGM is active: 63.7% Average Glucose: 163 mg/dL  Glucose Management Indicator: 7.2% Glucose Variability: 26.4% (goal <36%) Time in Goal:  - Time in range 70-180: 66% - Time above range: 33% - Time below range: 0%  - Time below 55: 1%  Observed patterns: Blood glucose has improved with restart of Ozempic . GMI decreased from 8.0% to 7.2%; Time in range now at 66% - previously was 41%; Still having some post-prandial elevations.      Current medication access support: Approved for Novo Nordisk - 01/27/2024 thru  08/05/2024.   Objective:  Lab Results  Component Value Date   HGBA1C 12.9 (H) 12/23/2023    Lab Results  Component Value Date   CREATININE 0.77 12/23/2023   BUN 10 12/23/2023   NA 137 12/23/2023   K 4.2 12/23/2023   CL 101 12/23/2023   CO2 29 12/23/2023    Lab Results  Component Value Date   CHOL 209 (H) 12/23/2023   HDL 39.20 12/23/2023   LDLCALC 117 (H) 12/23/2023   LDLDIRECT 55.0 06/05/2021   TRIG 261.0 (H) 12/23/2023   CHOLHDL 5 12/23/2023    Medications Reviewed Today   Medications were not reviewed in this encounter       Assessment/Plan:   Diabetes:Currently uncontrolled per A1c but home blood glucose per Continuous Glucose Monitor shows improvement since restarting Ozempic  - Called [employer] patient assistance program. They are processing order for Ozempic , insulin  degludec and Novolog  (degludec will replace Lantus  and Novolog  will replace Humalog ) but it takes 10 to 14 business days to process. Expect to receive first delivery between 02/10/2024 and 02/14/2024. Requested vouchers for 30 day supply to use at local pharmacy but was denied by [employer] patient assistance program.  - Called his pharmacy - to get 1 month of Lantus  and Humalog  would be $25 each. Requeted RF and notified patient - Continue with plan to change to [place] and Novolog  for medication assistance program.  - Recommend to continue Ozempic  0.5mg  weekly - Recommend to check glucose with Dex Com G7   Follow Up Plan: 1 to 2  weeks  Sean Horn  Sean Horn, PharmD Clinical Pharmacist [facility]

## 2024-02-10 ENCOUNTER — Ambulatory Visit (INDEPENDENT_AMBULATORY_CARE_PROVIDER_SITE_OTHER): Admitting: Pharmacist

## 2024-02-10 DIAGNOSIS — E118 Type 2 diabetes mellitus with unspecified complications: Secondary | ICD-10-CM

## 2024-02-10 DIAGNOSIS — Z794 Long term (current) use of insulin: Secondary | ICD-10-CM

## 2024-02-10 NOTE — Progress Notes (Signed)
 02/10/2024 Name: Sean Horn MRN: [id number] DOB: 1961/01/20  Chief Complaint  Patient presents with   Diabetes   Medication Management    Sean Horn is a 63 y.o. year old male.    They were referred to the pharmacist by their PCP for assistance in managing diabetes and medication access.  Checking on patient assistance program status today and reviewing Continuous Glucose Monitor report.    Subjective:  Care Team: Primary Care Provider: Watt Harlene BROCKS, MD ; Next Scheduled Visit: not currently scheduled  Medication Access/Adherence  Patient was approved to receive Novolog , Ozempic  and insulin  degludec [name]) from [employer] patient assistance program 01/27/2024 but he has not received first shipment yet. He did just refill short and long acting insulins.   Current Pharmacy:  [facility] [address] [place] - [address] [place] [address] Phone: [phone] Fax: [phone]   Patient reports affordability concerns with their medications: Yes  Patient reports access/transportation concerns to their pharmacy: No  Patient reports adherence concerns with their medications:  Yes     Diabetes:  Current medications:  Ozempic  0.5mg  weekly (restarted Ozempic  about 8 weeks ago) Lantus  60 units once a day (will change to insulin  degludec / [place] when patient assistance shipment received) Humalog  20 units 3 times a day with meals.  (Will change to Novolog  when patient assistance shipment received)  Medications tried in the past: metformin  - has GI side effects / diarrhea.   Using DexCom G7 sensor / Continuous Glucose Monitor system.   Date of Download: 02/10/2024 - but last day of data was 02/06/2024 % Time CGM is active: 63.2% Average Glucose: 175 mg/dL  Glucose Management Indicator: 7.5% Glucose Variability: 23.8% (goal <36%) Time in Goal:  - Time in range 70-180: 51% - Time above range: 48% - Time below range: 0%   - Time below 55: 1%  Observed patterns: Blood glucose has improved with restart of Ozempic . GMI decreased from 8.0% to 7.5%; Time in range now at 51% - previously was 41%; Still having some post-prandial elevations.      Current medication access support: Approved for Novo Nordisk - 01/27/2024 thru 08/05/2024.   Objective:  Lab Results  Component Value Date   HGBA1C 12.9 (H) 12/23/2023    Lab Results  Component Value Date   CREATININE 0.77 12/23/2023   BUN 10 12/23/2023   NA 137 12/23/2023   K 4.2 12/23/2023   CL 101 12/23/2023   CO2 29 12/23/2023    Lab Results  Component Value Date   CHOL 209 (H) 12/23/2023   HDL 39.20 12/23/2023   LDLCALC 117 (H) 12/23/2023   LDLDIRECT 55.0 06/05/2021   TRIG 261.0 (H) 12/23/2023   CHOLHDL 5 12/23/2023    Current Outpatient Medications  Medication Instructions   acetaminophen  (TYLENOL ) 1,000-1,500 mg, 2 times daily PRN   acetaminophen -codeine  (TYLENOL  #3) 300-30 MG tablet 1-2 tablets, Oral, Every 8 hours PRN   atorvastatin  (LIPITOR) 40 mg, Oral, Daily at bedtime   clopidogrel  (PLAVIX ) 75 mg, Oral, Daily with breakfast   Continuous Glucose Sensor (DEXCOM G7 SENSOR) MISC 1 each   escitalopram  (LEXAPRO ) 20 mg, Oral, Daily   flecainide  (TAMBOCOR ) 50 mg, Oral, 2 times daily   insulin  lispro (HUMALOG ) 100 UNIT/ML injection Inject 20 units three times daily with meals   isosorbide  mononitrate (IMDUR ) 30 mg, Oral, Daily   Lantus  SoloStar 60 Units, Subcutaneous, Daily at bedtime   levothyroxine  (SYNTHROID ) 25 MCG tablet TAKE  1 TABLET BY MOUTH ONCE DAILY BEFORE BREAKFAST   losartan  (COZAAR ) 25 mg, Oral, Daily   metoprolol  succinate (TOPROL -XL) 50 mg, Oral, Daily, Take with or immediately following a meal.   nitroGLYCERIN  (NITROSTAT ) 0.4 mg, Sublingual, Every 5 min PRN   pantoprazole  (PROTONIX ) 40 mg, Oral, Daily   rOPINIRole  (REQUIP ) 1 mg, Oral, Daily at bedtime   Semaglutide ,0.25 or 0.5MG /DOS, (OZEMPIC , 0.25 OR 0.5 MG/DOSE,) 2  MG/3ML SOPN Inject 0.25 mg weekly for 4 weeks, then go up to 0.5 mg weekly   traZODone  (DESYREL ) 25-50 mg, Oral, At bedtime PRN      Assessment/Plan:   Diabetes:Currently uncontrolled per A1c but home blood glucose per Continuous Glucose Monitor shows improvement since restarting Ozempic  - Called [employer] patient assistance program. They are still processing order for Ozempic , insulin  degludec and Novolog  (degludec will replace Lantus  and Novolog  will replace Humalog ) but it takes 10 to 14 business days to process. Expect to receive first delivery between 02/14/2024 to 02/21/2024.  - Continue with plan to change to [place] and Novolog  once he receives delivery from medication assistance program.  - Recommend to continue Ozempic  0.5mg  weekly - Recommend to check glucose with Dex Com G7   Follow Up Plan: 1 to 2  weeks  Madelin Ray, PharmD Clinical Pharmacist [place] [facility]

## 2024-02-18 ENCOUNTER — Telehealth: Payer: Self-pay

## 2024-02-18 NOTE — Telephone Encounter (Signed)
 Called pt and left a VM to inform pt his Insulin  is here in office ready for pickup.

## 2024-02-20 ENCOUNTER — Encounter: Payer: Self-pay | Admitting: Family Medicine

## 2024-02-20 DIAGNOSIS — E118 Type 2 diabetes mellitus with unspecified complications: Secondary | ICD-10-CM

## 2024-02-21 ENCOUNTER — Other Ambulatory Visit (INDEPENDENT_AMBULATORY_CARE_PROVIDER_SITE_OTHER)

## 2024-02-21 ENCOUNTER — Encounter: Payer: Self-pay | Admitting: Family Medicine

## 2024-02-21 DIAGNOSIS — Z794 Long term (current) use of insulin: Secondary | ICD-10-CM

## 2024-02-21 DIAGNOSIS — E118 Type 2 diabetes mellitus with unspecified complications: Secondary | ICD-10-CM | POA: Diagnosis not present

## 2024-02-21 LAB — HEMOGLOBIN A1C: Hgb A1c MFr Bld: 8 % — ABNORMAL HIGH (ref 4.6–6.5)

## 2024-02-21 NOTE — Telephone Encounter (Signed)
Medication picked up by pt

## 2024-02-24 ENCOUNTER — Telehealth: Payer: Self-pay | Admitting: Pharmacist

## 2024-02-24 DIAGNOSIS — E118 Type 2 diabetes mellitus with unspecified complications: Secondary | ICD-10-CM

## 2024-02-24 NOTE — Telephone Encounter (Signed)
 Reviewed patient's recent A1c and DexCom Continuous Glucose Monitor report. Blood glucose has improved but not at goal yet.  LM for patient to call me. We can discuss adjusting either Ozempic  or long acting insulin , though when he changes from Lantus  to [place], he might see a little better blood glucose average.  CB# [phone] or [phone]

## 2024-03-04 ENCOUNTER — Telehealth: Payer: Self-pay | Admitting: Pharmacist

## 2024-03-04 NOTE — Telephone Encounter (Signed)
 Received MyChart message from patient that he had not received patient assistance program shipment of Novolog  or Ozempic .  Called [employer]. Both meds were shipped to our office 02/20/2024.  Medication is still in our fridge waiting for patient to pick up. Looks like he might nothave been notfied yet (shipment was close to other shipment of insulin  degludec which arrived 02/18/24)  Patient notified that he can pick up Ozempic  and Novolog  in office M-F 7:30am to 5pm at his convenience.

## 2024-03-09 ENCOUNTER — Telehealth: Payer: Self-pay | Admitting: Pharmacist

## 2024-03-09 NOTE — Telephone Encounter (Signed)
 Called to see if he was having any issues with getting DexCom sensor - I didn't see any recent readings in the [facility] Clarity program.  LM on VM with CB# [phone]

## 2024-03-27 ENCOUNTER — Encounter: Payer: Self-pay | Admitting: Cardiology

## 2024-03-27 NOTE — Telephone Encounter (Signed)
 Called patient. Verified name and DOB. Shortness of breath started about a week ago. Patient wakes up not being able to catch his breath. Patient not exerting himself when he feels short of breath, he is usually asleep. He feels short of breath while resting.  Patient denies chest pain, dizziness, headache. Blood pressure running within normal range.  Scheduled patient for an office visit on April 21, 2024 with Lum Louis NP. Advised patient to go to ER for evaluation if symptoms worsen. Patient verbalized understanding.  Josie RN

## 2024-03-29 ENCOUNTER — Encounter (HOSPITAL_COMMUNITY): Payer: Self-pay | Admitting: Emergency Medicine

## 2024-03-29 ENCOUNTER — Emergency Department (HOSPITAL_COMMUNITY)

## 2024-03-29 ENCOUNTER — Emergency Department (HOSPITAL_COMMUNITY)
Admission: EM | Admit: 2024-03-29 | Discharge: 2024-03-29 | Disposition: A | Discharge status: Home / Self Care | Attending: Student | Admitting: Student

## 2024-03-29 ENCOUNTER — Other Ambulatory Visit: Payer: Self-pay

## 2024-03-29 DIAGNOSIS — I10 Essential (primary) hypertension: Secondary | ICD-10-CM | POA: Insufficient documentation

## 2024-03-29 DIAGNOSIS — R0602 Shortness of breath: Secondary | ICD-10-CM | POA: Insufficient documentation

## 2024-03-29 DIAGNOSIS — Z96651 Presence of right artificial knee joint: Secondary | ICD-10-CM | POA: Diagnosis not present

## 2024-03-29 DIAGNOSIS — R002 Palpitations: Secondary | ICD-10-CM | POA: Diagnosis not present

## 2024-03-29 DIAGNOSIS — E119 Type 2 diabetes mellitus without complications: Secondary | ICD-10-CM | POA: Insufficient documentation

## 2024-03-29 DIAGNOSIS — R0989 Other specified symptoms and signs involving the circulatory and respiratory systems: Secondary | ICD-10-CM | POA: Diagnosis not present

## 2024-03-29 DIAGNOSIS — R0600 Dyspnea, unspecified: Secondary | ICD-10-CM

## 2024-03-29 DIAGNOSIS — Z794 Long term (current) use of insulin: Secondary | ICD-10-CM | POA: Insufficient documentation

## 2024-03-29 DIAGNOSIS — J9811 Atelectasis: Secondary | ICD-10-CM | POA: Diagnosis not present

## 2024-03-29 DIAGNOSIS — I2510 Atherosclerotic heart disease of native coronary artery without angina pectoris: Secondary | ICD-10-CM | POA: Insufficient documentation

## 2024-03-29 DIAGNOSIS — Z96641 Presence of right artificial hip joint: Secondary | ICD-10-CM | POA: Diagnosis not present

## 2024-03-29 LAB — COMPREHENSIVE METABOLIC PANEL WITH GFR
ALT: 18 U/L (ref 0–44)
AST: 17 U/L (ref 15–41)
Albumin: 3.7 g/dL (ref 3.5–5.0)
Alkaline Phosphatase: 68 U/L (ref 38–126)
Anion gap: 10 (ref 5–15)
BUN: 9 mg/dL (ref 8–23)
CO2: 26 mmol/L (ref 22–32)
Calcium: 9.6 mg/dL (ref 8.9–10.3)
Chloride: 100 mmol/L (ref 98–111)
Creatinine, Ser: 0.82 mg/dL (ref 0.61–1.24)
GFR, Estimated: >60 mL/min (ref >60)
Glucose, Bld: 192 mg/dL — ABNORMAL HIGH (ref 70–99)
Potassium: 4 mmol/L (ref 3.5–5.1)
Sodium: 136 mmol/L (ref 135–145)
Total Bilirubin: 2.1 mg/dL — ABNORMAL HIGH (ref 0.0–1.2)
Total Protein: 6.8 g/dL (ref 6.5–8.1)

## 2024-03-29 LAB — BRAIN NATRIURETIC PEPTIDE: B Natriuretic Peptide: 63 pg/mL (ref 0.0–100.0)

## 2024-03-29 LAB — CBC WITH DIFFERENTIAL/PLATELET
Abs Immature Granulocytes: 0.03 K/uL (ref 0.00–0.07)
Basophils Absolute: 0.1 K/uL (ref 0.0–0.1)
Basophils Relative: 1 %
Eosinophils Absolute: 0.2 K/uL (ref 0.0–0.5)
Eosinophils Relative: 2 %
HCT: 42.6 % (ref 39.0–52.0)
Hemoglobin: 15.6 g/dL (ref 13.0–17.0)
Immature Granulocytes: 0 %
Lymphocytes Relative: 26 %
Lymphs Abs: 2.1 K/uL (ref 0.7–4.0)
MCH: 32.2 pg (ref 26.0–34.0)
MCHC: 36.6 g/dL — ABNORMAL HIGH (ref 30.0–36.0)
MCV: 87.8 fL (ref 80.0–100.0)
Monocytes Absolute: 1.1 K/uL — ABNORMAL HIGH (ref 0.1–1.0)
Monocytes Relative: 13 %
Neutro Abs: 4.6 K/uL (ref 1.7–7.7)
Neutrophils Relative %: 58 %
Platelets: 233 K/uL (ref 150–400)
RBC: 4.85 MIL/uL (ref 4.22–5.81)
RDW: 12 % (ref 11.5–15.5)
WBC: 8.1 K/uL (ref 4.0–10.5)
nRBC: 0 % (ref 0.0–0.2)

## 2024-03-29 LAB — MAGNESIUM: Magnesium: 1.6 mg/dL — ABNORMAL LOW (ref 1.7–2.4)

## 2024-03-29 LAB — TROPONIN I (HIGH SENSITIVITY): Troponin I (High Sensitivity): <2 ng/L (ref <18)

## 2024-03-29 MED ORDER — MAGNESIUM OXIDE -MG SUPPLEMENT 400 (240 MG) MG PO TABS
800.0000 mg | ORAL_TABLET | Freq: Once | ORAL | Status: AC
  Administered 2024-03-29: 800 mg via ORAL
  Filled 2024-03-29: qty 2

## 2024-03-29 NOTE — ED Provider Notes (Signed)
 [place] EMERGENCY DEPARTMENT AT [facility] Provider Note  CSN: [id number] Arrival date & time: 03/29/24 1612  Chief Complaint(s) Shortness of Breath  HPI Sean Horn is a 63 y.o. male with PMH HTN, CAD with known 80% apical LAD lesion on medical management not a suitable candidate for PCI, paroxysmal SVT/AVNRT status post ablation in 2020, NSVT on beta-blocker ultimately transition to flecainide  who presents Emergency Department for evaluation of shortness of breath.  He states that throughout his electrophysiology history he has had these episodes of severe shortness of breath that ultimately resolve.  They are not exertional and he does not feel associated palpitations when this happens.  He states that this is what it feels like when he had his SVT/nonsustained ventricular tachycardia in the past.  States he has been compliant with his medications and has not missed any doses.  Denies current chest pain, shortness of breath, abdominal pain or nausea, vomiting or other systemic symptoms while here in the ER right now.   Past Medical History Past Medical History:  Diagnosis Date   Anxiety    CAD in native artery    a. cath 06/26/2019 - 80% apical LAD >> medical management    Chronic headaches    Chronic neck pain    Coronary artery disease involving native coronary artery of native heart without angina pectoris 11/06/2019   GERD (gastroesophageal reflux disease)    occasional - OTC as needed   History of chicken pox    History of kidney stones    History of shingles    Hypertension    under control with med., had been on med. since age 19   Insomnia 09/13/2016   Insulin  dependent diabetes mellitus    Follows with endocrinology, Dr Gretta reports his last hgba1c was 7.3    Non-insulin  dependent type 2 diabetes mellitus (HCC)    Non-ST elevation (NSTEMI) myocardial infarction (HCC) 06/26/2019   Obesity 01/30/2017   Occipital neuralgia    Osteoarthritis    right knee    PONV (postoperative nausea and vomiting)    PONV (postoperative nausea and vomiting)    Preventative health care 08/14/2013   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   And left now    SVT (supraventricular tachycardia) (HCC) 06/26/2019   Tachycardia    TIA (transient ischemic attack) 11/03/2018   Type 2 diabetes mellitus with complication, with long-term current use of insulin  [facility])    Follows with endocrinology, Dr Gretta reports his last hgba1c was 7.3    Urinary frequency 12/11/2016   Patient Active Problem List   Diagnosis Date Noted   Hypothyroidism 09/05/2022   Unilateral primary osteoarthritis, left hip 01/30/2022   Syncope and collapse 01/22/2022   PSVT (paroxysmal supraventricular tachycardia) (HCC) 12/12/2021   NSVT (nonsustained ventricular tachycardia) (HCC) 12/12/2021   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   Unilateral primary osteoarthritis, right hip 06/26/2021   Hyperbilirubinemia 11/15/2020   PONV (postoperative nausea and vomiting)    Type 2 diabetes mellitus with proteinuria (HCC)    Chronic neck pain    Chronic headaches    CAD in native artery    Coronary artery disease involving native coronary artery of native heart without angina pectoris 11/06/2019   Diastolic dysfunction 11/06/2019   Non-ST elevation (NSTEMI) myocardial infarction (HCC) 06/26/2019   Tachycardia 06/25/2019   Hypertriglyceridemia 06/16/2019   Palpitations 06/12/2019   Morning headache 12/02/2018   Insomnia secondary to chronic pain 12/02/2018   Retrognathia 12/02/2018   TIA (  transient ischemic attack) 11/03/2018   Cervical stenosis of spine 01/16/2018   Hyperlipidemia 07/19/2017   Obesity 01/30/2017   Urinary frequency 12/11/2016   Insomnia 09/13/2016   History of chicken pox    History of shingles    Preventative health care 08/14/2013   Anxiety    History of kidney stones    GERD (gastroesophageal reflux disease)    Essential hypertension, benign    Osteoarthritis    Right knee  DJD 12/23/2012    Class: Chronic   Home Medication(s) Prior to Admission medications   Medication Sig Start Date End Date Taking? Authorizing Provider  acetaminophen  (TYLENOL ) 500 MG tablet Take 1,000-1,500 mg by mouth 2 (two) times daily as needed for moderate pain.    [provider]  acetaminophen -codeine  (TYLENOL  #3) 300-30 MG tablet Take 1-2 tablets by mouth every 8 (eight) hours as needed for moderate pain (pain score 4-6). 01/23/24   Copland, Harlene BROCKS, MD  atorvastatin  (LIPITOR) 40 MG tablet Take 1 tablet (40 mg total) by mouth at bedtime. 12/23/23   Copland, Harlene BROCKS, MD  clopidogrel  (PLAVIX ) 75 MG tablet Take 1 tablet by mouth once daily with breakfast 09/06/23   Tobb, Kardie, DO  Continuous Glucose Sensor (DEXCOM G7 SENSOR) MISC 1 each by Does not apply route. Change every 10 days    [provider]  escitalopram  (LEXAPRO ) 20 MG tablet Take 1 tablet by mouth once daily 10/15/23   Copland, Jessica C, MD  flecainide  (TAMBOCOR ) 50 MG tablet Take 1 tablet (50 mg total) by mouth 2 (two) times daily. 08/15/23   Jerilynn Lamarr HERO, NP  insulin  degludec (TRESIBA FLEXTOUCH) 100 UNIT/ML FlexTouch Pen Inject 60 Units into the skin daily. Receiving from [employer] medication assistance program thru 08/05/2024 03/09/24   Copland, Harlene BROCKS, MD  insulin  glargine (LANTUS  SOLOSTAR) 100 UNIT/ML Solostar Pen Inject 60 Units into the skin at bedtime. 02/24/24   Copland, Harlene BROCKS, MD  insulin  lispro (HUMALOG ) 100 UNIT/ML injection Inject 20 units three times daily with meals 12/20/23   Copland, Harlene BROCKS, MD  isosorbide  mononitrate (IMDUR ) 30 MG 24 hr tablet Take 1 tablet (30 mg total) by mouth daily. 01/06/24   Copland, Harlene BROCKS, MD  levothyroxine  (SYNTHROID ) 25 MCG tablet TAKE 1 TABLET BY MOUTH ONCE DAILY BEFORE BREAKFAST 02/11/23   Copland, Harlene BROCKS, MD  losartan  (COZAAR ) 25 MG tablet Take 1 tablet (25 mg total) by mouth daily. 12/25/23   Copland, Harlene BROCKS, MD  metoprolol  succinate  (TOPROL -XL) 50 MG 24 hr tablet Take 1 tablet (50 mg total) by mouth daily. Take with or immediately following a meal. 09/11/23   Tobb, Kardie, DO  nitroGLYCERIN  (NITROSTAT ) 0.4 MG SL tablet Place 1 tablet (0.4 mg total) under the tongue every 5 (five) minutes as needed. 08/20/23   Tobb, Kardie, DO  pantoprazole  (PROTONIX ) 40 MG tablet Take 1 tablet (40 mg total) by mouth daily. 01/06/24   Copland, Harlene BROCKS, MD  rOPINIRole  (REQUIP ) 0.5 MG tablet TAKE 2 TABLETS BY MOUTH AT BEDTIME Patient not taking: Reported on 01/20/2024 07/15/23   Copland, Harlene BROCKS, MD  Semaglutide ,0.25 or 0.5MG /DOS, (OZEMPIC , 0.25 OR 0.5 MG/DOSE,) 2 MG/3ML SOPN Inject 0.25 mg weekly for 4 weeks, then go up to 0.5 mg weekly Patient taking differently: Inject 0.5 mg into the skin once a week. Inject 0.25 mg weekly for 4 weeks, then go up to 0.5 mg weekly 12/23/23   Copland, Jessica C, MD  traZODone  (DESYREL ) 50 MG tablet Take 0.5-1 tablets (  25-50 mg total) by mouth at bedtime as needed for sleep. 01/20/24   Copland, Harlene BROCKS, MD                                                                                                                                    Past Surgical History Past Surgical History:  Procedure Laterality Date   ANTERIOR CERVICAL DECOMP/DISCECTOMY FUSION N/A 01/16/2018   Procedure: ANTERIOR CERVICAL DECOMPRESSION/DISCECTOMY FUSION CERVICAL FOUR-FIVE, CERVICAL FIVE-SIX;  Surgeon: Lanis Pupa, MD;  Location: [facility] OR;  Service: Neurosurgery;  Laterality: N/A;  ANTERIOR CERVICAL DECOMPRESSION/DISCECTOMY FUSION CERVICAL FOUR-FIVE, CERVICAL FIVE-SIX   CHOLECYSTECTOMY  1980's   CYSTOSCOPY/RETROGRADE/URETEROSCOPY/STONE EXTRACTION WITH BASKET Right 08/27/2003   and double J stent placement   ELECTROPHYSIOLOGY STUDY N/A 07/23/2019   Procedure: ELECTROPHYSIOLOGY STUDY;  Surgeon: Waddell Danelle ORN, MD;  Location: [facility] INVASIVE CV LAB;  Service: Cardiovascular;  Laterality: N/A;   INGUINAL HERNIA REPAIR Left 1990's   KNEE  ARTHROSCOPY Left 1980's   KNEE ARTHROSCOPY Right 2013   KNEE ARTHROSCOPY Right 06/30/2013   Procedure: RIGHT KNEE ARTHROSCOPY AND SYNOVECTOMY;  Surgeon: Maude KANDICE Herald, MD;  Location: [facility];  Service: Orthopedics;  Laterality: Right;   LEFT HEART CATH AND CORONARY ANGIOGRAPHY N/A 06/26/2019   Procedure: LEFT HEART CATH AND CORONARY ANGIOGRAPHY;  Surgeon: Anner Alm ORN, MD;  Location: [facility] INVASIVE CV LAB;  Service: Cardiovascular;  Laterality: N/A;   LITHOTRIPSY     multiple, b/l   SHOULDER ADHESION RELEASE Left ` 1998   SHOULDER ARTHROSCOPY W/ ROTATOR CUFF REPAIR Left 10/20/2004   x 4, had to have part of clavile removed. torn rotator cuff multiple times, had to place screws    SHOULDER ARTHROSCOPY W/ SUPERIOR LABRAL ANTERIOR POSTERIOR LESION REPAIR Left 12/22/2004   SVT ABLATION N/A 07/23/2019   Procedure: SVT ABLATION;  Surgeon: Waddell Danelle ORN, MD;  Location: [facility] INVASIVE CV LAB;  Service: Cardiovascular;  Laterality: N/A;   TOTAL HIP ARTHROPLASTY Right 10/10/2021   Procedure: Right TOTAL HIP ARTHROPLASTY ANTERIOR APPROACH;  Surgeon: Vernetta Lonni GRADE, MD;  Location: [facility] OR;  Service: Orthopedics;  Laterality: Right;   TOTAL KNEE ARTHROPLASTY Right 12/23/2012   x3, 2 arthroscopies and a TKR,  TOTAL KNEE ARTHROPLASTY;  Surgeon: Maude KANDICE Herald, MD;  Location: [facility] OR;  Service: Orthopedics;  Laterality: Right;  DEPUY, RNFA   WRIST SURGERY Right    x 3, torn ligaments, pins placed then infected, then fused with plate   Family History Family History  Problem Relation Age of Onset   COPD Mother        smoker   Hypertension Mother    Heart disease Mother        CHF   Diabetes Father    Heart disease Father        CABG in 63's   Stroke Father    Hypertension Father    Peripheral vascular disease Father  Hypertension Son        tachycardia   Heart disease Maternal Grandfather    Arthritis Paternal Grandmother    Colon cancer Neg Hx    Rectal cancer Neg Hx     Stomach cancer Neg Hx     Social History Social History   Tobacco Use   Smoking status: Never   Smokeless tobacco: Current    Types: Snuff  Vaping Use   Vaping status: Never Used  Substance Use Topics   Alcohol use: No   Drug use: No   Allergies Metformin  and related, Rosuvastatin , and Toradol [ketorolac tromethamine]  Review of Systems Review of Systems  Respiratory:  Positive for shortness of breath.     Physical Exam Vital Signs  I have reviewed the triage vital signs BP (!) 166/93   Pulse 75   Temp 98.1 F (36.7 C) (Oral)   Resp 18   Ht 6' (1.829 m)   Wt 102.1 kg   SpO2 94%   BMI 30.52 kg/m   Physical Exam Constitutional:      General: He is not in acute distress.    Appearance: Normal appearance.  HENT:     Head: Normocephalic and atraumatic.     Nose: No congestion or rhinorrhea.  Eyes:     General:        Right eye: No discharge.        Left eye: No discharge.     Extraocular Movements: Extraocular movements intact.     Pupils: Pupils are equal, round, and reactive to light.  Cardiovascular:     Rate and Rhythm: Normal rate and regular rhythm.     Heart sounds: No murmur heard. Pulmonary:     Effort: No respiratory distress.     Breath sounds: No wheezing or rales.  Abdominal:     General: There is no distension.     Tenderness: There is no abdominal tenderness.  Musculoskeletal:        General: Normal range of motion.     Cervical back: Normal range of motion.  Skin:    General: Skin is warm and dry.  Neurological:     General: No focal deficit present.     Mental Status: He is alert.     ED Results and Treatments Labs (all labs ordered are listed, but only abnormal results are displayed) Labs Reviewed  COMPREHENSIVE METABOLIC PANEL WITH GFR - Abnormal; Notable for the following components:      Result Value   Glucose, Bld 192 (*)    Total Bilirubin 2.1 (*)    All other components within normal limits  CBC WITH  DIFFERENTIAL/PLATELET - Abnormal; Notable for the following components:   MCHC 36.6 (*)    Monocytes Absolute 1.1 (*)    All other components within normal limits  MAGNESIUM  - Abnormal; Notable for the following components:   Magnesium  1.6 (*)    All other components within normal limits  BRAIN NATRIURETIC PEPTIDE  TROPONIN I (HIGH SENSITIVITY)  Radiology DG Chest Portable 1 View Result Date: 03/29/2024 CLINICAL DATA:  Intermittent shortness of breath. EXAM: PORTABLE CHEST 1 VIEW COMPARISON:  11/09/2021. FINDINGS: The heart size and mediastinal contours are within normal limits. Lung volumes are low with atelectasis at the lung bases. No effusion or pneumothorax is seen. No acute osseous abnormality. IMPRESSION: Low lung volumes with atelectasis at the lung bases. Electronically Signed   By: Leita Birmingham M.D.   On: 03/29/2024 17:28    Pertinent labs & imaging results that were available during my care of the patient were reviewed by me and considered in my medical decision making (see MDM for details).  Medications Ordered in ED Medications  magnesium  oxide (MAG-OX) tablet 800 mg (800 mg Oral Given 03/29/24 1754)                                                                                                                                     Procedures Procedures  (including critical care time)  Medical Decision Making / ED Course   This patient presents to the ED for concern of intermittent shortness of breath, this involves an extensive number of treatment options, and is a complaint that carries with it a high risk of complications and morbidity.  The differential diagnosis includes arrhythmia, pneumonia, ACS, PE, PTX  MDM: Patient seen emerged part for evaluation of intermittent shortness of breath.  Physical exam is unremarkable with no increased work of  breathing, cardiopulmonary exam unremarkable with no wheezing or rales.  Laboratory valuation with some mild hypomagnesemia to 1.6 which was repleted in the emergency department.  High-sensitivity troponin is normal.  Patient was observed for over 2 and half hours here in the Emergency Department on cardiac telemetry and he did not have any additional episodes of these acute shortness of breath attacks.  I do have suspicion that he may be experiencing either nonsustained ventricular tachycardia or paroxysmal SVT as the symptoms appear pretty similar to when these were previously diagnosed.  He is on both flecainide  and a beta-blocker and he voices compliance with these.  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  Thus patient states that he will call the cardiology office first thing in the morning to help arrange an outpatient Zio patch.  At this time he does not meet inpatient criteria for admission will be discharged with outpatient follow-up.  Return precautions given which he voiced understanding.   Additional history obtained:  -External records from outside source obtained and reviewed including: Chart review including previous notes, labs, imaging, consultation notes   Lab Tests: -I ordered, reviewed, and interpreted labs.   The pertinent results include:   Labs Reviewed  COMPREHENSIVE METABOLIC PANEL WITH GFR - Abnormal; Notable for the following components:  Result Value   Glucose, Bld 192 (*)    Total Bilirubin 2.1 (*)    All other components within normal limits  CBC WITH DIFFERENTIAL/PLATELET - Abnormal; Notable for the following components:   MCHC 36.6 (*)    Monocytes Absolute 1.1 (*)    All other components within normal limits  MAGNESIUM  - Abnormal; Notable for the following components:    Magnesium  1.6 (*)    All other components within normal limits  BRAIN NATRIURETIC PEPTIDE  TROPONIN I (HIGH SENSITIVITY)      EKG   EKG Interpretation Date/Time:  Sunday March 29 2024 16:57:48 EDT Ventricular Rate:  82 PR Interval:  207 QRS Duration:  99 QT Interval:  375 QTC Calculation: 438 R Axis:   66  Text Interpretation: Sinus rhythm Confirmed by Taunia Frasco ([id number]) on 03/29/2024 9:10:39 PM         Imaging Studies ordered: I ordered imaging studies including chest x-ray I independently visualized and interpreted imaging. I agree with the radiologist interpretation   Medicines ordered and prescription drug management: Meds ordered this encounter  Medications   magnesium  oxide (MAG-OX) tablet 800 mg    -I have reviewed the patients home medicines and have made adjustments as needed  Critical interventions none  Consultations Obtained: I requested consultation with the cardiologist on-call,  and discussed lab and imaging findings as well as pertinent plan - they recommend: Outpatient ZIO   Cardiac Monitoring: The patient was maintained on a cardiac monitor.  I personally viewed and interpreted the cardiac monitored which showed an underlying rhythm of: NSR  Social Determinants of Health:  Factors impacting patients care include: none   Reevaluation: After the interventions noted above, I reevaluated the patient and found that they have :stayed the same  Co morbidities that complicate the patient evaluation  Past Medical History:  Diagnosis Date   Anxiety    CAD in native artery    a. cath 06/26/2019 - 80% apical LAD >> medical management    Chronic headaches    Chronic neck pain    Coronary artery disease involving native coronary artery of native heart without angina pectoris 11/06/2019   GERD (gastroesophageal reflux disease)    occasional - OTC as needed   History of chicken pox    History of kidney stones    History of shingles     Hypertension    under control with med., had been on med. since age 7   Insomnia 09/13/2016   Insulin  dependent diabetes mellitus    Follows with endocrinology, Dr Gretta reports his last hgba1c was 7.3    Non-insulin  dependent type 2 diabetes mellitus (HCC)    Non-ST elevation (NSTEMI) myocardial infarction (HCC) 06/26/2019   Obesity 01/30/2017   Occipital neuralgia    Osteoarthritis    right knee   PONV (postoperative nausea and vomiting)    PONV (postoperative nausea and vomiting)    Preventative health care 08/14/2013   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   And left now    SVT (supraventricular tachycardia) (HCC) 06/26/2019   Tachycardia    TIA (transient ischemic attack) 11/03/2018   Type 2 diabetes mellitus with complication, with long-term current use of insulin  [facility])    Follows with endocrinology, Dr Gretta reports his last hgba1c was 7.3    Urinary frequency 12/11/2016      Dispostion: I considered admission for this patient, but at this time he does not meet inpatient criteria for admission and be discharged with outpatient follow-up  Final Clinical Impression(s) / ED Diagnoses Final diagnoses:  Dyspnea, unspecified type  Palpitations     @PCDICTATION @    Albertina Dixon, MD 03/29/24 2111

## 2024-03-29 NOTE — ED Triage Notes (Signed)
 Pt c/o sob intermittently for a couple of days.

## 2024-03-30 ENCOUNTER — Other Ambulatory Visit: Payer: Self-pay | Admitting: Family Medicine

## 2024-03-30 ENCOUNTER — Telehealth: Payer: Self-pay | Admitting: Cardiology

## 2024-03-30 DIAGNOSIS — F419 Anxiety disorder, unspecified: Secondary | ICD-10-CM

## 2024-03-30 NOTE — Telephone Encounter (Signed)
  Per MyChart scheduling message:  Pt c/o Shortness Of Breath: STAT if SOB developed within the last 24 hours or pt is noticeably SOB on the phone  1. Are you currently SOB (can you hear that pt is SOB on the phone)?   2. How long have you been experiencing SOB?   3. Are you SOB when sitting or when up moving around?   4. Are you currently experiencing any other symptoms?     1- yes 2- started on Thursday  3- Sitting and laying down  4- No

## 2024-03-30 NOTE — Telephone Encounter (Signed)
 Patient states he has been having episodes of SOB since last week. He states they got worse yesterday and he went to the ED. Patient was advised to follow-up with our office to arrange a Zio heart monitor.  From ED note: I do have suspicion that he may be experiencing either nonsustained ventricular tachycardia or paroxysmal SVT as the symptoms appear pretty similar to when these were previously diagnosed. He is on both flecainide  and a beta-blocker and he voices compliance with these. 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 Thus patient states that he will call the cardiology office first thing in the morning to help arrange an outpatient Zio patch.    Patient states episodes last for 10-15 seconds. BP/HR WNL per patient report. Denies chest pain or dizziness. He states this happens randomly when sitting in chair or when active, he does not notice any pattern.  Will forward to Dr. Waddell to review and advise.

## 2024-03-31 ENCOUNTER — Encounter: Payer: Self-pay | Admitting: Family Medicine

## 2024-04-02 ENCOUNTER — Other Ambulatory Visit: Payer: Self-pay | Admitting: Medical Genetics

## 2024-04-02 ENCOUNTER — Encounter: Payer: Self-pay | Admitting: Family Medicine

## 2024-04-02 ENCOUNTER — Ambulatory Visit (INDEPENDENT_AMBULATORY_CARE_PROVIDER_SITE_OTHER): Admitting: Family Medicine

## 2024-04-02 ENCOUNTER — Telehealth: Payer: Self-pay | Admitting: Internal Medicine

## 2024-04-02 VITALS — BP 150/88 | HR 83 | Ht 72.0 in | Wt 231.0 lb

## 2024-04-02 DIAGNOSIS — R0600 Dyspnea, unspecified: Secondary | ICD-10-CM

## 2024-04-02 DIAGNOSIS — E039 Hypothyroidism, unspecified: Secondary | ICD-10-CM

## 2024-04-02 LAB — COMPREHENSIVE METABOLIC PANEL WITH GFR
ALT: 14 U/L (ref 0–53)
AST: 14 U/L (ref 0–37)
Albumin: 4.2 g/dL (ref 3.5–5.2)
Alkaline Phosphatase: 66 U/L (ref 39–117)
BUN: 13 mg/dL (ref 6–23)
CO2: 29 meq/L (ref 19–32)
Calcium: 8.7 mg/dL (ref 8.4–10.5)
Chloride: 99 meq/L (ref 96–112)
Creatinine, Ser: 0.89 mg/dL (ref 0.40–1.50)
GFR: 91.7 mL/min (ref >60.00)
Glucose, Bld: 279 mg/dL — ABNORMAL HIGH (ref 70–99)
Potassium: 4.8 meq/L (ref 3.5–5.1)
Sodium: 134 meq/L — ABNORMAL LOW (ref 135–145)
Total Bilirubin: 2.3 mg/dL — ABNORMAL HIGH (ref 0.2–1.2)
Total Protein: 6.8 g/dL (ref 6.0–8.3)

## 2024-04-02 LAB — MAGNESIUM: Magnesium: 1.6 mg/dL (ref 1.5–2.5)

## 2024-04-02 LAB — TSH: TSH: 1.46 u[IU]/mL (ref 0.35–5.50)

## 2024-04-02 NOTE — Progress Notes (Signed)
 Acute Office Visit  Subjective:     Patient ID: Sean Horn, male    DOB: October 25, 1960, 63 y.o.   MRN: [id number]  Chief Complaint  Patient presents with   Hospitalization Follow-up    HPI Patient is in today for ER follow-up related to dyspneic episodes.   Discussed the use of AI scribe software for clinical note transcription with the patient, who gave verbal consent to proceed.  History of Present Illness Sean Horn is a 63 year old male with SVT who presents with episodes of shortness of breath.  He experienced episodes of shortness of breath. These episodes last about 10 to 15 seconds and resolve when he sits up. They occur intermittently, approximately two times an hour, but sometimes with longer intervals between episodes. He has not experienced any episodes in the last day.  He recalls having similar episodes four to five years ago, which were severe enough to cause blackouts. He has a history of SVT and has experienced palpitations in the past, although he seldom feels them now. No associated dizziness, lightheadedness, chest pain, or palpitations during the current episodes of shortness of breath.  Recent workup in the emergency department included a chest x-ray, and an EKG, which was stable, mild atelectasis to bases. No sick symptoms. Blood work revealed low magnesium  levels, for which he received supplementation. His blood sugar was noted to be high, with a recent A1c of 8, improved from 12.9 in May.  His current medications include flecainide , which he thinks he takes once daily, although it was prescribed twice daily, Imdur , levothyroxine , losartan , metoprolol , and diabetes medications. His last cardiology visit was about a year ago. He is scheduled to see his cardiologist in 2-3 weeks.  He reports a mild cough at night but denies any significant cough or sputum production during the day. He also mentions experiencing hip pain, which he attributes to physical  activity, and has a home blood pressure monitor to track his blood pressure, which has been slightly elevated.     ROS All review of systems negative except what is listed in the HPI      Objective:    BP (!) 150/88   Pulse 83   Ht 6' (1.829 m)   Wt 231 lb (104.8 kg)   SpO2 96%   BMI 31.33 kg/m    Physical Exam Vitals reviewed.  Constitutional:      Appearance: Normal appearance.  Cardiovascular:     Rate and Rhythm: Normal rate and regular rhythm.  Pulmonary:     Effort: Pulmonary effort is normal.     Breath sounds: Normal breath sounds. No wheezing, rhonchi or rales.  Skin:    General: Skin is warm and dry.  Neurological:     Mental Status: He is alert and oriented to person, place, and time.  Psychiatric:        Mood and Affect: Mood normal.        Behavior: Behavior normal.        Thought Content: Thought content normal.        Judgment: Judgment normal.     No results found for any visits on 04/02/24.      Assessment & Plan:   Problem List Items Addressed This Visit       Active Problems   Hypothyroidism   Relevant Orders   TSH   Other Visit Diagnoses       Dyspnea, unspecified type    -  Primary  Hypomagnesemia       Relevant Orders   Comprehensive metabolic panel with GFR   Magnesium        Assessment & Plan Episodes of shortness of breath Intermittent episodes, frequency decreased over the past 24 hours. Stable on exam today. - Order Zio heart monitor if cardiology does not respond by end of day. - Instruct to call 911 if episodes last longer or if severe symptoms occur. - Advise against driving during symptoms. - Take flecainide  50 mg twice daily as prescribed - he will double check to see how he has been taking it. - Recheck thyroid  function  - Follow up with cardiology - Monitor blood pressure at home and record readings. - Order magnesium  level recheck.       No orders of the defined types were placed in this  encounter.   Return for - pending results or sooner if needed.  Waddell KATHEE Mon, NP

## 2024-04-02 NOTE — Telephone Encounter (Signed)
 Patient stated he had a recent ED visit and they recommended he wear a heart monitor.  Patient wants to know if the heart monitor has been ordered or next steps.

## 2024-04-02 NOTE — Telephone Encounter (Signed)
 Spoke with pt regarding a zio heart monitor. Pt was assured that a note has been sent to Dr. Waddell regarding getting the pt set up with an appointment and or sending him a zio monitor. Pt verbalized understanding. All questions if any were answered.

## 2024-04-02 NOTE — Progress Notes (Signed)
 ER visit Sunday SOB  Chest Xray Results:   DG Chest Portable 1 View Result Date: 03/29/2024 CLINICAL DATA:  Intermittent shortness of breath. EXAM: PORTABLE CHEST 1 VIEW COMPARISON:  11/09/2021. FINDINGS: The heart size and mediastinal contours are within normal limits. Lung volumes are low with atelectasis at the lung bases. No effusion or pneumothorax is seen. No acute osseous abnormality. IMPRESSION: Low lung volumes with atelectasis at the lung bases. Electronically Signed   By: Leita Birmingham M.D.   On: 03/29/2024 17:28   Still having episodes of SOB but not lasting as long as they were. He has had this in the past and they led to passing out.   Cardiology appt in 3 weeks

## 2024-04-03 ENCOUNTER — Other Ambulatory Visit (HOSPITAL_COMMUNITY)

## 2024-04-03 ENCOUNTER — Ambulatory Visit: Payer: Self-pay | Admitting: Family Medicine

## 2024-04-03 ENCOUNTER — Ambulatory Visit

## 2024-04-03 DIAGNOSIS — R17 Unspecified jaundice: Secondary | ICD-10-CM

## 2024-04-03 LAB — BILIRUBIN, FRACTIONATED(TOT/DIR/INDIR)
Bilirubin, Direct: 0.2 mg/dL (ref 0.0–0.2)
Indirect Bilirubin: 0.7 mg/dL (ref 0.2–1.2)
Total Bilirubin: 0.9 mg/dL (ref 0.2–1.2)

## 2024-04-07 ENCOUNTER — Encounter: Payer: Self-pay | Admitting: Pharmacist

## 2024-04-07 ENCOUNTER — Ambulatory Visit: Payer: Self-pay | Admitting: Family Medicine

## 2024-04-08 ENCOUNTER — Other Ambulatory Visit (HOSPITAL_COMMUNITY)
Admission: RE | Admit: 2024-04-08 | Discharge: 2024-04-08 | Disposition: A | Discharge status: Home / Self Care | Payer: Self-pay | Source: Ambulatory Visit | Attending: Oncology | Admitting: Oncology

## 2024-04-09 MED ORDER — DEXCOM G7 SENSOR MISC: 1 refills | Status: DC

## 2024-04-12 ENCOUNTER — Other Ambulatory Visit: Payer: Self-pay | Admitting: Family Medicine

## 2024-04-13 ENCOUNTER — Encounter: Payer: Self-pay | Admitting: Family Medicine

## 2024-04-14 ENCOUNTER — Other Ambulatory Visit: Payer: Self-pay | Admitting: Family Medicine

## 2024-04-14 DIAGNOSIS — F5101 Primary insomnia: Secondary | ICD-10-CM

## 2024-04-14 LAB — GENECONNECT MOLECULAR SCREEN: Genetic Analysis Overall Interpretation: NEGATIVE

## 2024-04-14 NOTE — Telephone Encounter (Signed)
 Pt has appointment with Lum Louis, NP on 9/16

## 2024-04-15 ENCOUNTER — Other Ambulatory Visit: Payer: Self-pay | Admitting: Family Medicine

## 2024-04-15 DIAGNOSIS — F419 Anxiety disorder, unspecified: Secondary | ICD-10-CM

## 2024-04-17 ENCOUNTER — Encounter (HOSPITAL_BASED_OUTPATIENT_CLINIC_OR_DEPARTMENT_OTHER): Payer: Self-pay | Admitting: *Deleted

## 2024-04-21 ENCOUNTER — Ambulatory Visit: Admitting: Emergency Medicine

## 2024-04-30 ENCOUNTER — Other Ambulatory Visit: Payer: Self-pay | Admitting: Family Medicine

## 2024-04-30 DIAGNOSIS — M1711 Unilateral primary osteoarthritis, right knee: Secondary | ICD-10-CM

## 2024-05-04 NOTE — Telephone Encounter (Signed)
 Looks like he did not make that appointment. If still having a problem, then 7 day zio and followup with me. In a month.

## 2024-05-06 ENCOUNTER — Telehealth: Payer: Self-pay

## 2024-05-06 NOTE — Telephone Encounter (Signed)
 Completed refill order form for Ozempic  and faxed to provider's office for review and signature.

## 2024-05-12 NOTE — Telephone Encounter (Signed)
 Reviewed patient's Continuous Glucose Monitor report. See below. Blood glucose improved but still above goal. Would like to discuss resutls with patient and consider increasing Ozempic  dose before we reorder from [employer].  Tried to call patient today but no answer. LM on VM with my CB# [phone].

## 2024-05-18 ENCOUNTER — Encounter (INDEPENDENT_AMBULATORY_CARE_PROVIDER_SITE_OTHER): Payer: Self-pay | Admitting: Family Medicine

## 2024-05-18 ENCOUNTER — Telehealth: Payer: Self-pay

## 2024-05-18 DIAGNOSIS — B029 Zoster without complications: Secondary | ICD-10-CM | POA: Diagnosis not present

## 2024-05-18 MED ORDER — VALACYCLOVIR HCL 1 G PO TABS: 1000.0000 mg | ORAL_TABLET | Freq: Three times a day (TID) | ORAL | 0 refills | Status: AC

## 2024-05-18 MED ORDER — HYDROCODONE-ACETAMINOPHEN 5-325 MG PO TABS: 1.0000 | ORAL_TABLET | Freq: Three times a day (TID) | ORAL | 0 refills | Status: AC | PRN

## 2024-05-18 NOTE — Telephone Encounter (Signed)

## 2024-05-18 NOTE — Telephone Encounter (Signed)
 Called pt and left a VM to inform pt we have his insulin  here in office for pick up, advised pt to call the office back with any further questions or concerns.

## 2024-05-18 NOTE — Addendum Note (Signed)
 Addended by: WATT RAISIN C on: 05/18/2024 12:17 PM   Modules accepted: Orders

## 2024-05-18 NOTE — Addendum Note (Signed)
 Addended by: WATT RAISIN C on: 05/18/2024 12:36 PM   Modules accepted: Orders

## 2024-05-22 NOTE — Telephone Encounter (Signed)
 Second unsuccessful outreach to patient to discussed his recent Continuous Glucose Monitor report and discuss increasing Ozempic .  Since he will be due to refill Ozempic  thru [employer] patient assistance program soon, will keep dose at 0.5mg  weekly and send in refill request to Med Assist Team.

## 2024-05-25 ENCOUNTER — Telehealth: Payer: Self-pay

## 2024-05-25 NOTE — Telephone Encounter (Signed)
 Called pt and left a VM informing pt we have his insulin  ready for pick up. Advised pt to call the office back with any questions or concerns.

## 2024-06-01 ENCOUNTER — Other Ambulatory Visit: Payer: Self-pay | Admitting: Family Medicine

## 2024-06-08 ENCOUNTER — Encounter: Payer: Self-pay | Admitting: Radiology

## 2024-06-15 ENCOUNTER — Other Ambulatory Visit: Payer: Self-pay | Admitting: Pharmacist

## 2024-06-15 ENCOUNTER — Telehealth: Payer: Self-pay

## 2024-06-15 NOTE — Telephone Encounter (Signed)
 PAP: Patient assistance application for Novolog  and Tresiba  through [employer] has been mailed to pt's home address on file. Provider portion of application will be faxed to provider's office. Patient portion e-filed.

## 2024-06-15 NOTE — Telephone Encounter (Signed)
 Patient also still has Tresiba and Novolog  in the office refrigerator that was shipped from the [employer] medication assistance program. Tried to call - LM on VM reminding him that he had meds in our office to pick up.

## 2024-06-15 NOTE — Telephone Encounter (Signed)
 Updating medication list - patient's insulin  regimen is Tresiba / insulin  degluden 60 units once a day and Novolog  20 units prior to each meal. Max daily dose of 60 units.

## 2024-06-17 NOTE — Telephone Encounter (Signed)
 PAP: Application for Novolog  and Tresiba  has been submitted to [employer], via fax

## 2024-06-22 ENCOUNTER — Other Ambulatory Visit: Payer: Self-pay | Admitting: Family Medicine

## 2024-06-22 DIAGNOSIS — M1711 Unilateral primary osteoarthritis, right knee: Secondary | ICD-10-CM

## 2024-06-25 ENCOUNTER — Other Ambulatory Visit: Payer: Self-pay | Admitting: Family Medicine

## 2024-06-25 ENCOUNTER — Telehealth (HOSPITAL_BASED_OUTPATIENT_CLINIC_OR_DEPARTMENT_OTHER): Payer: Self-pay

## 2024-06-25 DIAGNOSIS — F419 Anxiety disorder, unspecified: Secondary | ICD-10-CM

## 2024-06-25 NOTE — Telephone Encounter (Signed)
   Pre-operative Risk Assessment    Patient Name: Sean Horn  DOB: 04/07/1961 MRN: [id number]   Date of last office visit: 08/15/2023 with Lamarr Satterfield, NP Date of next office visit: None  Request for Surgical Clearance    Procedure:  Spinal Cord Stimulator (SCS) trial. SCS trials are used before permanent ones to treat chronic pain. Leads will be placed in the epidural space of the spine. If successful, permanent leads and battery are then implanted at a later date.   Date of Surgery:  Clearance TBD                                 Surgeon:  Dr. Catherene Surgeon's Group or Practice Name:  [facility]  Phone number:  [phone] Fax number:  [phone] ATTN: Danyella   Type of Clearance Requested:   - Medical  - Pharmacy:  Hold Clopidogrel  (Plavix ) -Clearance and permission to hold PLAVIX  for 14 days due to risk of bleeding at procedure site. Seven full days before the procedure and seven days during the SCS Trial, then resume 24 hours after leads are pulled (per ASRA Guidelines). (NOTE: Trial period may be shortened from seven to six or five, if deemed by prescribing provider). You may wish for this pt to have a Lovenox  Bridge. If so, we ask you to manage this. The pt will need to stop Lovenox  12 hours before procedure/lead pull.   Type of Anesthesia:  Not specified   Additional requests/questions:    Bonney Patrcia Iverson LITTIE   06/25/2024, 5:33 PM

## 2024-06-26 NOTE — Telephone Encounter (Signed)
   Name: Sean Horn  DOB: September 19, 1960  MRN: [id number]  Primary Cardiologist: Kardie Tobb, DO   Preoperative team, please contact this patient and set up a phone call appointment for further preoperative risk assessment. Please obtain consent and complete medication review. Thank you for your help.  I confirm that guidance regarding antiplatelet and oral anticoagulation therapy has been completed and, if necessary, noted below:  Per office protocol, if patient is without any new symptoms or concerns at the time of their virtual visit, cardiology recommends he may hold Plavix  for 5 days prior to procedure, if surgeon requires 7 days increased risk is noted. Please resume Plavix  as soon as possible postprocedure, at the discretion of the surgeon.    I also confirmed the patient resides in the state of [place] . As per [facility] Medical Board telemedicine laws, the patient must reside in the state in which the provider is licensed.   Lamarr Satterfield, NP 06/26/2024, 8:52 AM [facility]

## 2024-06-28 ENCOUNTER — Telehealth: Payer: Self-pay | Admitting: Family Medicine

## 2024-06-29 NOTE — Telephone Encounter (Signed)
 Lvm to schedule

## 2024-06-29 NOTE — Telephone Encounter (Signed)
Pt called to f/u-please advise.

## 2024-06-29 NOTE — Telephone Encounter (Signed)
 Sean Horn- Pt is due for appt w/ Dr. Watt. Can you try reaching out to Pt? Thank you.

## 2024-07-01 NOTE — Telephone Encounter (Signed)
 PAP: Patient assistance application for Novolog  and [place] has been approved by PAP Companies: [employer] from 08/06/2024 to 08/05/2025. Medication should be delivered to PAP Delivery: Provider's office. For further shipping updates, please contact [employer] at [phone]. Patient ID is: [id number]

## 2024-07-05 NOTE — Progress Notes (Unsigned)
  Healthcare at [facility] [address], [address] [place], [place] [address] [phone]  Date:  07/08/2024   Name:  Sean Horn   DOB:  [birth year]   MRN:  [id number]  PCP:  Watt Harlene BROCKS, MD    Chief Complaint: No chief complaint on file.   History of Present Illness:  Sean Horn is a 63 y.o. very pleasant male patient who presents with the following:  Patient seen today for periodic follow-up.  I saw him most recently in May -history of TIA, SVT, end-stage arthritis of knees and hips, diabetes, hyperlipidemia, hypertension, CAD  He is under cardiology care Has been using Tylenol  No. 3 for joint pain  At our last visit in May he had paused endocrinology care due to difficulty with the co-pay He also could not afford his Ozempic .  I got him set up with our clinical pharmacist Tammy and she was able to help him obtain medications to improve his A1c.  He was at 12.9% in May and more recently at 8%  Lab Results  Component Value Date   HGBA1C 8.0 (H) 02/21/2024    He was seen by pain management with [facility] most recently in the last couple of weeks.  He has been trying to get a spinal stimulator to help deal with his neuropathy but has to get his A1c under control first  Flu shot Can update A1c Eye exam Cologuard is up-to-date He has received RSV, pneumococcal vaccination Recommend Shingrix  Discussed the use of AI scribe software for clinical note transcription with the patient, who gave verbal consent to proceed.  History of Present Illness     Patient Active Problem List   Diagnosis Date Noted   Hypothyroidism 09/05/2022   Unilateral primary osteoarthritis, left hip 01/30/2022   Syncope and collapse 01/22/2022   PSVT (paroxysmal supraventricular tachycardia) 12/12/2021   NSVT (nonsustained ventricular tachycardia) (HCC) 12/12/2021   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   Unilateral  primary osteoarthritis, right hip 06/26/2021   Hyperbilirubinemia 11/15/2020   PONV (postoperative nausea and vomiting)    Type 2 diabetes mellitus with proteinuria (HCC)    Chronic neck pain    Chronic headaches    CAD in native artery    Coronary artery disease involving native coronary artery of native heart without angina pectoris 11/06/2019   Diastolic dysfunction 11/06/2019   Non-ST elevation (NSTEMI) myocardial infarction (HCC) 06/26/2019   Tachycardia 06/25/2019   Hypertriglyceridemia 06/16/2019   Palpitations 06/12/2019   Morning headache 12/02/2018   Insomnia secondary to chronic pain 12/02/2018   Retrognathia 12/02/2018   TIA (transient ischemic attack) 11/03/2018   Cervical stenosis of spine 01/16/2018   Hyperlipidemia 07/19/2017   Obesity 01/30/2017   Urinary frequency 12/11/2016   Insomnia 09/13/2016   History of chicken pox    History of shingles    Preventative health care 08/14/2013   Anxiety    History of kidney stones    GERD (gastroesophageal reflux disease)    Essential hypertension, benign    Osteoarthritis    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 cath 06/26/2019 - 80% apical LAD >> medical management    Chronic headaches    Chronic neck pain    Coronary artery disease involving native coronary artery of native heart  without angina pectoris 11/06/2019   GERD (gastroesophageal reflux disease)    occasional - OTC as needed   History of chicken pox    History of kidney stones    History of shingles    Hypertension    under control with med., had been on med. since age 40   Insomnia 09/13/2016   Insulin  dependent diabetes mellitus    Follows with endocrinology, Dr Gretta reports his last hgba1c was 7.3    Non-insulin  dependent type 2 diabetes mellitus (HCC)    Non-ST elevation (NSTEMI) myocardial infarction (HCC) 06/26/2019   Obesity 01/30/2017   Occipital  neuralgia    Osteoarthritis    right knee   PONV (postoperative nausea and vomiting)    PONV (postoperative nausea and vomiting)    Preventative health care 08/14/2013   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   And left now    SVT (supraventricular tachycardia) 06/26/2019   Tachycardia    TIA (transient ischemic attack) 11/03/2018   Type 2 diabetes mellitus with complication, with long-term current use of insulin  [facility])    Follows with endocrinology, Dr Gretta reports his last hgba1c was 7.3    Urinary frequency 12/11/2016    Past Surgical History:  Procedure Laterality Date   ANTERIOR CERVICAL DECOMP/DISCECTOMY FUSION N/A 01/16/2018   Procedure: ANTERIOR CERVICAL DECOMPRESSION/DISCECTOMY FUSION CERVICAL FOUR-FIVE, CERVICAL FIVE-SIX;  Surgeon: Lanis Pupa, MD;  Location: [facility] OR;  Service: Neurosurgery;  Laterality: N/A;  ANTERIOR CERVICAL DECOMPRESSION/DISCECTOMY FUSION CERVICAL FOUR-FIVE, CERVICAL FIVE-SIX   CHOLECYSTECTOMY  1980's   CYSTOSCOPY/RETROGRADE/URETEROSCOPY/STONE EXTRACTION WITH BASKET Right 08/27/2003   and double J stent placement   ELECTROPHYSIOLOGY STUDY N/A 07/23/2019   Procedure: ELECTROPHYSIOLOGY STUDY;  Surgeon: Waddell Danelle ORN, MD;  Location: [facility] INVASIVE CV LAB;  Service: Cardiovascular;  Laterality: N/A;   INGUINAL HERNIA REPAIR Left 1990's   KNEE ARTHROSCOPY Left 1980's   KNEE ARTHROSCOPY Right 2013   KNEE ARTHROSCOPY Right 06/30/2013   Procedure: RIGHT KNEE ARTHROSCOPY AND SYNOVECTOMY;  Surgeon: Maude KANDICE Herald, MD;  Location:  SURGERY CENTER;  Service: Orthopedics;  Laterality: Right;   LEFT HEART CATH AND CORONARY ANGIOGRAPHY N/A 06/26/2019   Procedure: LEFT HEART CATH AND CORONARY ANGIOGRAPHY;  Surgeon: Anner Alm ORN, MD;  Location: [facility] INVASIVE CV LAB;  Service: Cardiovascular;  Laterality: N/A;   LITHOTRIPSY     multiple, b/l   SHOULDER ADHESION RELEASE Left ` 1998   SHOULDER ARTHROSCOPY W/ ROTATOR CUFF REPAIR Left 10/20/2004   x 4, had to have part of  clavile removed. torn rotator cuff multiple times, had to place screws    SHOULDER ARTHROSCOPY W/ SUPERIOR LABRAL ANTERIOR POSTERIOR LESION REPAIR Left 12/22/2004   SVT ABLATION N/A 07/23/2019   Procedure: SVT ABLATION;  Surgeon: Waddell Danelle ORN, MD;  Location: [facility] INVASIVE CV LAB;  Service: Cardiovascular;  Laterality: N/A;   TOTAL HIP ARTHROPLASTY Right 10/10/2021   Procedure: Right TOTAL HIP ARTHROPLASTY ANTERIOR APPROACH;  Surgeon: Vernetta Lonni GRADE, MD;  Location: [facility] OR;  Service: Orthopedics;  Laterality: Right;   TOTAL KNEE ARTHROPLASTY Right 12/23/2012   x3, 2 arthroscopies and a TKR,  TOTAL KNEE ARTHROPLASTY;  Surgeon: Maude KANDICE Herald, MD;  Location: [facility] OR;  Service: Orthopedics;  Laterality: Right;  DEPUY, RNFA   WRIST SURGERY Right    x 3, torn ligaments, pins placed then infected, then fused with plate    Social History   Tobacco Use   Smoking status: Never   Smokeless tobacco: Current    Types: Snuff  Vaping  Use   Vaping status: Never Used  Substance Use Topics   Alcohol use: No   Drug use: No    Family History  Problem Relation Age of Onset   COPD Mother        smoker   Hypertension Mother    Heart disease Mother        CHF   Diabetes Father    Heart disease Father        CABG in 28's   Stroke Father    Hypertension Father    Peripheral vascular disease Father    Hypertension Son        tachycardia   Heart disease Maternal Grandfather    Arthritis Paternal Grandmother    Colon cancer Neg Hx    Rectal cancer Neg Hx    Stomach cancer Neg Hx     Allergies  Allergen Reactions   Metformin  And Related Diarrhea    Gi intolerance even with ER formula   Rosuvastatin  Other (See Comments)    Joint pain   Toradol [Ketorolac Tromethamine] Itching    Medication list has been reviewed and updated.  Current Outpatient Medications on File Prior to Visit  Medication Sig Dispense Refill   acetaminophen  (TYLENOL ) 500 MG tablet Take 1,000-1,500 mg by mouth 2  (two) times daily as needed for moderate pain.     acetaminophen -codeine  (TYLENOL  #3) 300-30 MG tablet Take 1-2 tablets by mouth every 8 (eight) hours as needed for moderate pain (pain score 4-6). 30 tablet 1   atorvastatin  (LIPITOR) 40 MG tablet Take 1 tablet (40 mg total) by mouth at bedtime. 90 tablet 3   clopidogrel  (PLAVIX ) 75 MG tablet Take 1 tablet by mouth once daily with breakfast 90 tablet 3   Continuous Glucose Sensor (DEXCOM G7 SENSOR) MISC USE   TO CHECK GLUCOSE AND CHANGE EVERY 10 DAYS 3 each 0   DULoxetine  (CYMBALTA ) 30 MG capsule Take 60 mg by mouth every morning.     escitalopram  (LEXAPRO ) 20 MG tablet Take 1 tablet (20 mg total) by mouth daily. 90 tablet 0   flecainide  (TAMBOCOR ) 50 MG tablet Take 1 tablet (50 mg total) by mouth 2 (two) times daily. 180 tablet 3   insulin  aspart (NOVOLOG  FLEXPEN) 100 UNIT/ML FlexPen Inject 20 Units into the skin 3 (three) times daily with meals.     insulin  degludec (TRESIBA FLEXTOUCH) 100 UNIT/ML FlexTouch Pen Inject 60 Units into the skin daily. Receiving from [employer] medication assistance program thru 08/05/2024     isosorbide  mononitrate (IMDUR ) 30 MG 24 hr tablet Take 1 tablet (30 mg total) by mouth daily. 90 tablet 0   levothyroxine  (SYNTHROID ) 25 MCG tablet TAKE 1 TABLET BY MOUTH ONCE DAILY BEFORE BREAKFAST 90 tablet 1   losartan  (COZAAR ) 25 MG tablet Take 1 tablet (25 mg total) by mouth daily. 90 tablet 3   metoprolol  succinate (TOPROL -XL) 50 MG 24 hr tablet Take 1 tablet (50 mg total) by mouth daily. Take with or immediately following a meal. 90 tablet 4   nitroGLYCERIN  (NITROSTAT ) 0.4 MG SL tablet Place 1 tablet (0.4 mg total) under the tongue every 5 (five) minutes as needed. 25 tablet 12   pantoprazole  (PROTONIX ) 40 MG tablet Take 1 tablet (40 mg total) by mouth daily. 90 tablet 1   rOPINIRole  (REQUIP ) 0.5 MG tablet TAKE 2 TABLETS BY MOUTH AT BEDTIME 180 tablet 0   Semaglutide ,0.25 or 0.5MG /DOS, (OZEMPIC , 0.25 OR 0.5 MG/DOSE,) 2  MG/3ML SOPN Inject 0.25 mg weekly for  4 weeks, then go up to 0.5 mg weekly (Patient taking differently: Inject 0.5 mg into the skin once a week. Inject 0.25 mg weekly for 4 weeks, then go up to 0.5 mg weekly)     traZODone  (DESYREL ) 50 MG tablet TAKE 1/2 TO 1 (ONE-HALF TO ONE) TABLET BY MOUTH AT BEDTIME AS NEEDED FOR SLEEP 90 tablet 0   valACYclovir  (VALTREX ) 1000 MG tablet Take 1 tablet (1,000 mg total) by mouth 3 (three) times daily. 21 tablet 0   No current facility-administered medications on file prior to visit.    Review of Systems:  As per HPI- otherwise negative.   Physical Examination: There were no vitals filed for this visit. There were no vitals filed for this visit. There is no height or weight on file to calculate BMI. Ideal Body Weight:    GEN: no acute distress. HEENT: Atraumatic, Normocephalic.  Ears and Nose: No external deformity. CV: RRR, No M/G/R. No JVD. No thrill. No extra heart sounds. PULM: CTA B, no wheezes, crackles, rhonchi. No retractions. No resp. distress. No accessory muscle use. ABD: S, NT, ND, +BS. No rebound. No HSM. EXTR: No c/c/e PSYCH: Normally interactive. Conversant.    Assessment and Plan: No diagnosis found.  Assessment & Plan   Signed Harlene Schroeder, MD

## 2024-07-08 ENCOUNTER — Ambulatory Visit (INDEPENDENT_AMBULATORY_CARE_PROVIDER_SITE_OTHER): Admitting: Family Medicine

## 2024-07-08 ENCOUNTER — Encounter: Payer: Self-pay | Admitting: Family Medicine

## 2024-07-08 VITALS — BP 132/76 | HR 65 | Ht 72.0 in | Wt 225.6 lb

## 2024-07-08 DIAGNOSIS — E782 Mixed hyperlipidemia: Secondary | ICD-10-CM

## 2024-07-08 DIAGNOSIS — E118 Type 2 diabetes mellitus with unspecified complications: Secondary | ICD-10-CM | POA: Diagnosis not present

## 2024-07-08 DIAGNOSIS — R972 Elevated prostate specific antigen [PSA]: Secondary | ICD-10-CM | POA: Diagnosis not present

## 2024-07-08 DIAGNOSIS — F5101 Primary insomnia: Secondary | ICD-10-CM

## 2024-07-08 DIAGNOSIS — F419 Anxiety disorder, unspecified: Secondary | ICD-10-CM | POA: Diagnosis not present

## 2024-07-08 DIAGNOSIS — Z23 Encounter for immunization: Secondary | ICD-10-CM | POA: Diagnosis not present

## 2024-07-08 DIAGNOSIS — E039 Hypothyroidism, unspecified: Secondary | ICD-10-CM

## 2024-07-08 DIAGNOSIS — Z794 Long term (current) use of insulin: Secondary | ICD-10-CM

## 2024-07-08 MED ORDER — ESCITALOPRAM OXALATE 20 MG PO TABS: 20.0000 mg | ORAL_TABLET | Freq: Every day | ORAL | 3 refills | Status: AC

## 2024-07-08 MED ORDER — TRAZODONE HCL 50 MG PO TABS: ORAL_TABLET | ORAL | 3 refills | Status: AC

## 2024-07-08 NOTE — Patient Instructions (Addendum)
 Good to see you!  I will be in touch with your labs asap Ok to get the shringrix vaccine series at your convenience

## 2024-07-09 ENCOUNTER — Encounter: Payer: Self-pay | Admitting: Family Medicine

## 2024-07-09 DIAGNOSIS — E118 Type 2 diabetes mellitus with unspecified complications: Secondary | ICD-10-CM

## 2024-07-09 LAB — LIPID PANEL
Cholesterol: 119 mg/dL (ref 0–200)
HDL: 31.5 mg/dL — ABNORMAL LOW (ref >39.00)
LDL Cholesterol: 48 mg/dL (ref 0–99)
NonHDL: 87.3
Total CHOL/HDL Ratio: 4
Triglycerides: 196 mg/dL — ABNORMAL HIGH (ref 0.0–149.0)
VLDL: 39.2 mg/dL (ref 0.0–40.0)

## 2024-07-09 LAB — PSA: PSA: 1.22 ng/mL (ref 0.10–4.00)

## 2024-07-09 LAB — HEMOGLOBIN A1C: Hgb A1c MFr Bld: 7.6 % — ABNORMAL HIGH (ref 4.6–6.5)

## 2024-07-10 MED ORDER — SEMAGLUTIDE (1 MG/DOSE) 4 MG/3ML ~~LOC~~ SOPN: 1.0000 mg | PEN_INJECTOR | SUBCUTANEOUS | 2 refills | Status: AC

## 2024-07-15 ENCOUNTER — Encounter: Payer: Self-pay | Admitting: Cardiology

## 2024-07-15 ENCOUNTER — Other Ambulatory Visit: Payer: Self-pay | Admitting: Adult Health

## 2024-07-15 NOTE — Telephone Encounter (Signed)
 Left message for patient to call our office and ask for the preop team to schedule TELE preop appt.

## 2024-07-15 NOTE — Telephone Encounter (Signed)
 Danyella from [facility] was calling to f/u on pt being cleared or not. After ready notes, it looks like pt has been waiting on a callback from preop to f/u on the next step so that he can start the process of having his procedure done. She'd like for pt to contacted but stated if any questions contact her at [phone]. Please advise.

## 2024-07-15 NOTE — Telephone Encounter (Signed)
 Pt requesting a c/b to schedule a tele visit.

## 2024-07-16 ENCOUNTER — Telehealth: Payer: Self-pay

## 2024-07-16 NOTE — Telephone Encounter (Signed)
°  Patient Consent for Virtual Visit        Sean Horn has provided verbal consent on 07/16/2024 for a virtual visit (video or telephone).   CONSENT FOR VIRTUAL VISIT FOR:  Sean Horn  By participating in this virtual visit I agree to the following:  I hereby voluntarily request, consent and authorize [facility] and its employed or contracted physicians, physician assistants, nurse practitioners or other licensed health care professionals (the Practitioner), to provide me with telemedicine health care services (the Services) as deemed necessary by the treating Practitioner. I acknowledge and consent to receive the Services by the Practitioner via telemedicine. I understand that the telemedicine visit will involve communicating with the Practitioner through live audiovisual communication technology and the disclosure of certain medical information by electronic transmission. I acknowledge that I have been given the opportunity to request an in-person assessment or other available alternative prior to the telemedicine visit and am voluntarily participating in the telemedicine visit.  I understand that I have the right to withhold or withdraw my consent to the use of telemedicine in the course of my care at any time, without affecting my right to future care or treatment, and that the Practitioner or I may terminate the telemedicine visit at any time. I understand that I have the right to inspect all information obtained and/or recorded in the course of the telemedicine visit and may receive copies of available information for a reasonable fee.  I understand that some of the potential risks of receiving the Services via telemedicine include:  Delay or interruption in medical evaluation due to technological equipment failure or disruption; Information transmitted may not be sufficient (e.g. poor resolution of images) to allow for appropriate medical decision making by the  Practitioner; and/or  In rare instances, security protocols could fail, causing a breach of personal health information.  Furthermore, I acknowledge that it is my responsibility to provide information about my medical history, conditions and care that is complete and accurate to the best of my ability. I acknowledge that Practitioner's advice, recommendations, and/or decision may be based on factors not within their control, such as incomplete or inaccurate data provided by me or distortions of diagnostic images or specimens that may result from electronic transmissions. I understand that the practice of medicine is not an exact science and that Practitioner makes no warranties or guarantees regarding treatment outcomes. I acknowledge that a copy of this consent can be made available to me via my patient portal [facility] MyChart), or I can request a printed copy by calling the office of [facility].    I understand that my insurance will be billed for this visit.   I have read or had this consent read to me. I understand the contents of this consent, which adequately explains the benefits and risks of the Services being provided via telemedicine.  I have been provided ample opportunity to ask questions regarding this consent and the Services and have had my questions answered to my satisfaction. I give my informed consent for the services to be provided through the use of telemedicine in my medical care

## 2024-07-16 NOTE — Telephone Encounter (Signed)
 Pt returning nurse call to schedule tele visit.

## 2024-07-16 NOTE — Telephone Encounter (Signed)
 I called pt back again. His phone is going directly to vm. I have let the requesting office know as well.

## 2024-07-16 NOTE — Telephone Encounter (Signed)
 Patient returned Pre-op call.

## 2024-07-16 NOTE — Telephone Encounter (Signed)
 2nd attempt: Left message to call back to schedule tele preop appt.

## 2024-07-16 NOTE — Telephone Encounter (Signed)
 Pt scheduled for VV on 12/19

## 2024-07-24 ENCOUNTER — Ambulatory Visit: Attending: Internal Medicine | Admitting: Student

## 2024-07-24 DIAGNOSIS — Z01810 Encounter for preprocedural cardiovascular examination: Secondary | ICD-10-CM | POA: Diagnosis not present

## 2024-07-24 NOTE — Progress Notes (Signed)
 "   Virtual Visit via Telephone Note   Because of Sean Horn's co-morbid illnesses, he is at least at moderate risk for complications without adequate follow up.  This format is felt to be most appropriate for this patient at this time.  The patient did not have access to video technology/had technical difficulties with video requiring transitioning to audio format only (telephone).  All issues noted in this document were discussed and addressed.  No physical exam could be performed with this format.  Please refer to the patient's chart for his consent to telehealth for [facility].  Evaluation Performed:  Preoperative cardiovascular risk assessment _____________   Date:  07/24/2024   Patient ID:  Sean Horn, DOB 1960/12/15, MRN [id number] Patient Location:  Home Provider location:   Office  Primary Care Provider:  Watt Harlene BROCKS, MD  Primary Cardiologist:  [facility] Providers Cardiologist:  Dub Huntsman, DO Electrophysiologist:  Danelle Birmingham, MD    Chief Complaint / Patient Profile   63 y.o. 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 Catherene and presents today for telephonic preoperative cardiovascular risk assessment.  History of Present Illness    Sean Horn is a 63 y.o. male who presents via audio/video conferencing for a telehealth visit today.  Pt was last seen in cardiology clinic on 08/15/2023 by Lamarr Satterfield, NP.  At that time [name] was stable from a cardiac standpoint.  The patient is now pending procedure as outlined above. Since his last visit, he is doing well. Patient denies shortness of breath, dyspnea on exertion, lower extremity edema, orthopnea or PND. No chest pain, pressure, or tightness. No  palpitations. His activity is limited by his back pain. He is able to perform light to moderate household activities without limitation.   Past Medical History    Past Medical History:  Diagnosis Date   Anxiety    CAD in native artery    a. cath 06/26/2019 - 80% apical LAD >> medical management    Chronic headaches    Chronic neck pain    Coronary artery disease involving native coronary artery of native heart without angina pectoris 11/06/2019   GERD (gastroesophageal reflux disease)    occasional - OTC as needed   History of chicken pox    History of kidney stones    History of shingles    Hypertension    under control with med., had been on med. 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   And left now    SVT (supraventricular tachycardia) 06/26/2019   Tachycardia    TIA (transient ischemic attack) 11/03/2018   Type 2 diabetes mellitus with complication, with long-term current use of insulin  (HCC)    Follows with endocrinology, Dr Gretta reports his last hgba1c was 7.3  Urinary frequency 12/11/2016   Past Surgical History:  Procedure Laterality Date   ANTERIOR CERVICAL DECOMP/DISCECTOMY FUSION N/A 01/16/2018   Procedure: ANTERIOR CERVICAL DECOMPRESSION/DISCECTOMY FUSION CERVICAL FOUR-FIVE, CERVICAL FIVE-SIX;  Surgeon: Lanis Pupa, MD;  Location: [facility] OR;  Service: Neurosurgery;  Laterality: N/A;  ANTERIOR CERVICAL DECOMPRESSION/DISCECTOMY FUSION CERVICAL FOUR-FIVE, CERVICAL FIVE-SIX   CHOLECYSTECTOMY  1980's    CYSTOSCOPY/RETROGRADE/URETEROSCOPY/STONE EXTRACTION WITH BASKET Right 08/27/2003   and double J stent placement   ELECTROPHYSIOLOGY STUDY N/A 07/23/2019   Procedure: ELECTROPHYSIOLOGY STUDY;  Surgeon: Waddell Danelle ORN, MD;  Location: [facility] INVASIVE CV LAB;  Service: Cardiovascular;  Laterality: N/A;   INGUINAL HERNIA REPAIR Left 1990's   KNEE ARTHROSCOPY Left 1980's   KNEE ARTHROSCOPY Right 2013   KNEE ARTHROSCOPY Right 06/30/2013   Procedure: RIGHT KNEE ARTHROSCOPY AND SYNOVECTOMY;  Surgeon: Maude KANDICE Herald, MD;  Location: [facility];  Service: Orthopedics;  Laterality: Right;   LEFT HEART CATH AND CORONARY ANGIOGRAPHY N/A 06/26/2019   Procedure: LEFT HEART CATH AND CORONARY ANGIOGRAPHY;  Surgeon: Anner Alm ORN, MD;  Location: [facility] INVASIVE CV [facility];  Service: Cardiovascular;  Laterality: N/A;   LITHOTRIPSY     multiple, b/l   SHOULDER ADHESION RELEASE Left ` 1998   SHOULDER ARTHROSCOPY W/ ROTATOR CUFF REPAIR Left 10/20/2004   x 4, had to have part of clavile removed. torn rotator cuff multiple times, had to place screws    SHOULDER ARTHROSCOPY W/ SUPERIOR LABRAL ANTERIOR POSTERIOR LESION REPAIR Left 12/22/2004   SVT ABLATION N/A 07/23/2019   Procedure: SVT ABLATION;  Surgeon: Waddell Danelle ORN, MD;  Location: [facility] INVASIVE CV LAB;  Service: Cardiovascular;  Laterality: N/A;   TOTAL HIP ARTHROPLASTY Right 10/10/2021   Procedure: Right TOTAL HIP ARTHROPLASTY ANTERIOR APPROACH;  Surgeon: Vernetta Lonni GRADE, MD;  Location: [facility] OR;  Service: Orthopedics;  Laterality: Right;   TOTAL KNEE ARTHROPLASTY Right 12/23/2012   x3, 2 arthroscopies and a TKR,  TOTAL KNEE ARTHROPLASTY;  Surgeon: Maude KANDICE Herald, MD;  Location: [facility] OR;  Service: Orthopedics;  Laterality: Right;  DEPUY, RNFA   WRIST SURGERY Right    x 3, torn ligaments, pins placed then infected, then fused with plate    Allergies  Allergies[1]  Home Medications    Prior to Admission medications  Medication Sig Start Date End  Date Taking? Authorizing Provider  acetaminophen  (TYLENOL ) 500 MG tablet Take 1,000-1,500 mg by mouth 2 (two) times daily as needed for moderate pain.    [provider]  acetaminophen -codeine  (TYLENOL  #3) 300-30 MG tablet Take 1-2 tablets by mouth every 8 (eight) hours as needed for moderate pain (pain score 4-6). 06/22/24   Copland, Harlene BROCKS, MD  atorvastatin  (LIPITOR) 40 MG tablet Take 1 tablet (40 mg total) by mouth at bedtime. 12/23/23   Copland, Harlene BROCKS, MD  clopidogrel  (PLAVIX ) 75 MG tablet Take 1 tablet by mouth once daily with breakfast 09/06/23   Tobb, Kardie, DO  Continuous Glucose Sensor (DEXCOM G7 SENSOR) MISC USE   TO CHECK GLUCOSE AND CHANGE EVERY 10 DAYS 06/01/24   Domenica Harlene LABOR, MD  escitalopram  (LEXAPRO ) 20 MG tablet Take 1 tablet (20 mg total) by mouth daily. 07/08/24   Copland, Jessica C, MD  flecainide  (TAMBOCOR ) 50 MG tablet Take 1 tablet by mouth twice daily 07/17/24   Tobb, Kardie, DO  insulin  aspart (NOVOLOG  FLEXPEN) 100 UNIT/ML FlexPen Inject 20 Units into the skin 3 (three) times daily with meals.    [provider]  insulin  degludec (TRESIBA FLEXTOUCH) 100  UNIT/ML FlexTouch Pen Inject 60 Units into the skin daily. Receiving from Novo Nordisk medication assistance program thru 08/05/2024 03/09/24   Copland, Harlene BROCKS, MD  isosorbide  mononitrate (IMDUR ) 30 MG 24 hr tablet Take 1 tablet (30 mg total) by mouth daily. 01/06/24   Copland, Harlene BROCKS, MD  levothyroxine  (SYNTHROID ) 25 MCG tablet TAKE 1 TABLET BY MOUTH ONCE DAILY BEFORE BREAKFAST 02/11/23   Copland, Harlene BROCKS, MD  losartan  (COZAAR ) 25 MG tablet Take 1 tablet (25 mg total) by mouth daily. 12/25/23   Copland, Harlene BROCKS, MD  metoprolol  succinate (TOPROL -XL) 50 MG 24 hr tablet Take 1 tablet (50 mg total) by mouth daily. Take with or immediately following a meal. 09/11/23   Tobb, Kardie, DO  nitroGLYCERIN  (NITROSTAT ) 0.4 MG SL tablet Place 1 tablet (0.4 mg total) under the tongue every 5 (five) minutes as  needed. 08/20/23   Tobb, Kardie, DO  pantoprazole  (PROTONIX ) 40 MG tablet Take 1 tablet (40 mg total) by mouth daily. 04/13/24   Copland, Harlene BROCKS, MD  Semaglutide , 1 MG/DOSE, 4 MG/3ML SOPN Inject 1 mg as directed once a week. 07/10/24   Copland, Harlene BROCKS, MD  traZODone  (DESYREL ) 50 MG tablet TAKE 1/2 TO 1 (ONE-HALF TO ONE) TABLET BY MOUTH AT BEDTIME AS NEEDED FOR SLEEP 07/08/24   Copland, Harlene BROCKS, MD  valACYclovir  (VALTREX ) 1000 MG tablet Take 1 tablet (1,000 mg total) by mouth 3 (three) times daily. 05/18/24   Copland, Harlene BROCKS, MD    Physical Exam    Vital Signs:  ANCIL DEWAN does not have vital signs available for review today.  Given telephonic nature of communication, physical exam is limited. AAOx3. NAD. Normal affect.  Speech and respirations are unlabored.   Assessment & Plan    Preoperative cardiovascular risk assessment.  Spinal cord stimulator trial by Dr. Catherene  Chart reviewed as part of pre-operative protocol coverage. According to the RCRI, patient has a 0.9% risk of MACE. Patient reports activity equivalent to 5.07 METS (per DASI).   Given past medical history and time since last visit, based on ACC/AHA guidelines, DEMARRIUS GUERRERO would be at acceptable risk for the planned procedure without further cardiovascular testing.   Patient was advised that if he develops new symptoms prior to surgery to contact our office to arrange a follow-up appointment.  he verbalized understanding.  Per office protocol, if patient is without any new symptoms or concerns at the time of their virtual visit, cardiology recommends he may hold Plavix  for 5 days prior to procedure, if surgeon requires 7 days increased risk is noted. Please resume Plavix  as soon as possible postprocedure, at the discretion of the surgeon.      I will route this recommendation to the requesting party via Epic fax function.  Please call with questions.  Time:   Today, I have spent 6 minutes with the  patient with telehealth technology discussing medical history, symptoms, and management plan.     Barnie Hila, NP  07/24/2024, 8:55 AM     [1]  Allergies Allergen Reactions   Metformin  And Related Diarrhea    Gi intolerance even with ER formula   Rosuvastatin  Other (See Comments)    Joint pain   Toradol [Ketorolac Tromethamine] Itching   "

## 2024-07-29 NOTE — Addendum Note (Signed)
 Addended by: ORVIN HARLENE HERO on: 07/29/2024 12:00 PM   Modules accepted: Orders

## 2024-08-03 NOTE — Telephone Encounter (Signed)
 Med hold recommendations have now been routed to requesting office.

## 2024-08-03 NOTE — Telephone Encounter (Signed)
 Surgeon's office is calling back to discuss holding medications during the trial period. They would like a call back.

## 2024-08-03 NOTE — Telephone Encounter (Signed)
 I s/w the requesting office in regard to the call today about Plavix  hold. I stated not sure it was not a verbiage issue. I did confirm the request is for Plavix  to be held 7 days prior to procedure; the procedure is done, and Dr. Catherene request for pt to be able to hold Plavix  x 7 days post procedure, then resume the on day 8 post procedure when they take the leads out.    I stated I will send back to preop APP for review. Per requesting office DR. Bintrim wants to be sure the notes reflect holding Plavix  7 days post procedure as well.    Office did say they could hold Plavix  x 5 days prior as we have noted would be ok.

## 2024-08-04 NOTE — Telephone Encounter (Signed)
" °  Daniella from [facility] called back. She stated that the patient needs to be off the blood thinner for 5 days prior to the procedure and remain off it for another 5 days after the procedure, for a total of 10 days. They are okay with the patient using a Lovenox  bridge if needed. Dr. Catherene said that for further clarification, he can be contacted directly at [phone] "

## 2024-08-06 ENCOUNTER — Other Ambulatory Visit: Payer: Self-pay | Admitting: Cardiology

## 2024-08-09 ENCOUNTER — Encounter: Payer: Self-pay | Admitting: Pharmacist

## 2024-08-10 NOTE — Progress Notes (Signed)
 Sean Horn                                          MRN: [id number]   08/10/2024   The VBCI Quality Team Specialist reviewed this patient medical record for the purposes of chart review for care gap closure. The following were reviewed: abstraction for care gap closure-glycemic status assessment.    VBCI Quality Team

## 2024-08-17 ENCOUNTER — Other Ambulatory Visit

## 2024-08-17 NOTE — Telephone Encounter (Signed)
 Dr. Sheena,   Will you please address this for surgeon?   [facility]-Daniella calling to hold Plavix  for a total of 9 to 12 days. 5 days prior to trial and 4 to 7 days during trial. Please advise. Requesting permission for that long of hold, trial can be shortened to 4 days. Dr can be reached at [phone].    Fax: [phone] attention to Daniella   Thank you! KL

## 2024-08-17 NOTE — Telephone Encounter (Signed)
 Per Dr. Sheena  Hello Team,  It is not advisable and would not recommend  to hold this patient's DAPT ( Aspirin  and plavix ) outside of our recommended time period. I   Dr. Sheena

## 2024-08-17 NOTE — Telephone Encounter (Signed)
 [facility]-Daniella calling to hold Plavix  for a total of 9 to 12 days. 5 days prior to trial and 4 to 7 days during trial. Please advise. Requesting permission for that long of hold, trial can be shortened to 4 days. Dr can be reached at [phone].   Fax: [phone] attention to Daniella

## 2024-08-18 NOTE — Telephone Encounter (Signed)
 Faxed recommendations to [facility]

## 2024-08-19 ENCOUNTER — Telehealth: Payer: Self-pay | Admitting: Pharmacist

## 2024-08-19 ENCOUNTER — Other Ambulatory Visit (HOSPITAL_COMMUNITY): Payer: Self-pay

## 2024-08-19 ENCOUNTER — Other Ambulatory Visit

## 2024-08-19 MED ORDER — OZEMPIC (0.25 OR 0.5 MG/DOSE) 2 MG/3ML ~~LOC~~ SOPN: 0.5000 mg | PEN_INJECTOR | SUBCUTANEOUS | Status: AC

## 2024-08-19 MED ORDER — TRULICITY 1.5 MG/0.5ML ~~LOC~~ SOAJ: 1.5000 mg | SUBCUTANEOUS | Status: AC

## 2024-08-19 MED ORDER — TRULICITY 3 MG/0.5ML ~~LOC~~ SOAJ: 3.0000 mg | SUBCUTANEOUS | Status: AC

## 2024-08-19 NOTE — Telephone Encounter (Signed)
 Attempt was made to contact patient by phone today for follow up by Clinical Pharmacist regarding Ozempic  or Trulicity  patient assistance program .  Unable to reach patient. LM on VM with my contact number [phone].  I left patient a sample of Ozempic  but it will be the 0.5mg  strength  - recommend he use 0.5mg  weekly while we are trying to get patient assistance program for Trulicity . I would recommend starting with the 1.5mg  for 1 motnh, then increase to 3mg  dose weekly.

## 2024-08-20 ENCOUNTER — Telehealth: Payer: Self-pay

## 2024-08-20 NOTE — Telephone Encounter (Signed)
 PAP: Patient assistance application for Trulicity  through [employer] has been mailed to pt's home address on file. Provider portion of application will be faxed to provider's office.  Provider portion has been faxed to Dr. Harlene Copland

## 2024-08-24 NOTE — Telephone Encounter (Signed)
 Received provider portion of application

## 2024-09-08 ENCOUNTER — Encounter: Payer: Self-pay | Admitting: Neurology
